# Patient Record
Sex: Male | Born: 1955 | Race: White | Hispanic: No | Marital: Married | Smoking: Never smoker
Health system: Southern US, Community
[De-identification: ages and names within clinical notes are randomized; demographics above are authoritative.]

## PROBLEM LIST (undated history)

## (undated) DIAGNOSIS — K219 Gastro-esophageal reflux disease without esophagitis: Secondary | ICD-10-CM

## (undated) DIAGNOSIS — H9190 Unspecified hearing loss, unspecified ear: Secondary | ICD-10-CM

## (undated) DIAGNOSIS — Z8042 Family history of malignant neoplasm of prostate: Secondary | ICD-10-CM

## (undated) DIAGNOSIS — K635 Polyp of colon: Secondary | ICD-10-CM

## (undated) DIAGNOSIS — Z800 Family history of malignant neoplasm of digestive organs: Secondary | ICD-10-CM

## (undated) DIAGNOSIS — G4730 Sleep apnea, unspecified: Secondary | ICD-10-CM

## (undated) DIAGNOSIS — I10 Essential (primary) hypertension: Secondary | ICD-10-CM

## (undated) DIAGNOSIS — Z8481 Family history of carrier of genetic disease: Secondary | ICD-10-CM

## (undated) HISTORY — DX: Family history of malignant neoplasm of prostate: Z80.42

## (undated) HISTORY — DX: Polyp of colon: K63.5

## (undated) HISTORY — DX: Family history of carrier of genetic disease: Z84.81

## (undated) HISTORY — DX: Family history of malignant neoplasm of digestive organs: Z80.0

## (undated) HISTORY — DX: Sleep apnea, unspecified: G47.30

---

## 2012-11-24 ENCOUNTER — Encounter (HOSPITAL_COMMUNITY): Payer: Self-pay

## 2012-11-24 ENCOUNTER — Emergency Department (HOSPITAL_COMMUNITY)
Admission: EM | Admit: 2012-11-24 | Discharge: 2012-11-24 | Disposition: A | Discharge status: Home / Self Care | Payer: TRICARE For Life (TFL) | Attending: Emergency Medicine | Admitting: Emergency Medicine

## 2012-11-24 ENCOUNTER — Emergency Department (HOSPITAL_COMMUNITY): Payer: TRICARE For Life (TFL)

## 2012-11-24 DIAGNOSIS — R05 Cough: Secondary | ICD-10-CM | POA: Insufficient documentation

## 2012-11-24 DIAGNOSIS — J3489 Other specified disorders of nose and nasal sinuses: Secondary | ICD-10-CM | POA: Insufficient documentation

## 2012-11-24 DIAGNOSIS — J40 Bronchitis, not specified as acute or chronic: Secondary | ICD-10-CM | POA: Insufficient documentation

## 2012-11-24 DIAGNOSIS — Z8719 Personal history of other diseases of the digestive system: Secondary | ICD-10-CM | POA: Insufficient documentation

## 2012-11-24 DIAGNOSIS — R059 Cough, unspecified: Secondary | ICD-10-CM | POA: Insufficient documentation

## 2012-11-24 DIAGNOSIS — R6889 Other general symptoms and signs: Secondary | ICD-10-CM | POA: Insufficient documentation

## 2012-11-24 HISTORY — DX: Gastro-esophageal reflux disease without esophagitis: K21.9

## 2012-11-24 MED ORDER — BENZONATATE 100 MG PO CAPS: 100.0000 mg | ORAL_CAPSULE | Freq: Three times a day (TID) | ORAL | Status: DC | PRN

## 2012-11-24 MED ORDER — ALBUTEROL SULFATE HFA 108 (90 BASE) MCG/ACT IN AERS: INHALATION_SPRAY | RESPIRATORY_TRACT | Status: DC

## 2012-11-24 NOTE — ED Provider Notes (Signed)
History     CSN: [id number]  Arrival date & time 11/24/12  1330   First MD Initiated Contact with Patient 11/24/12 1345      Chief Complaint  Patient presents with  . Cough  . Nasal Congestion     HPI Pt was seen at 1350.   Per pt, c/o gradual onset and persistence of constant runny/stuffy nose, cough, sinus and ears congestion for the past 2-3 weeks.  Has been associated with home fevers to "101.2" last week; since resolved.  Denies any further fevers, no rash, no CP/SOB, no N/V/D, no abd pain, no back pain, no sore throat.     Past Medical History  Diagnosis Date  . Acid reflux     History reviewed. No pertinent past surgical history.   History  Substance Use Topics  . Smoking status: Never Smoker   . Smokeless tobacco: Not on file  . Alcohol Use: No    Review of Systems ROS: Statement: All systems negative except as marked or noted in the HPI; Constitutional: +fever and chills. ; ; Eyes: Negative for eye pain, redness and discharge. ; ; ENMT: Negative for ear pain, hoarseness, sore throat. +nasal and ears congestion, rhinorrhea, sinus pressure. ; ; Cardiovascular: Negative for chest pain, palpitations, diaphoresis, dyspnea and peripheral edema. ; ; Respiratory: +cough. Negative for wheezing and stridor. ; ; Gastrointestinal: Negative for nausea, vomiting, diarrhea, abdominal pain, blood in stool, hematemesis, jaundice and rectal bleeding. . ; ; Genitourinary: Negative for dysuria, flank pain and hematuria. ; ; Musculoskeletal: Negative for back pain and neck pain. Negative for swelling and trauma.; ; Skin: Negative for pruritus, rash, abrasions, blisters, bruising and skin lesion.; ; Neuro: Negative for headache, lightheadedness and neck stiffness. Negative for weakness, altered level of consciousness , altered mental status, extremity weakness, paresthesias, involuntary movement, seizure and syncope.       Allergies  Review of patient's allergies indicates no known  allergies.  Home Medications   Current Outpatient Rx  Name  Route  Sig  Dispense  Refill  . guaifenesin (ROBITUSSIN) 100 MG/5ML syrup   Oral   Take 200 mg by mouth 3 (three) times daily as needed for cough.         . pseudoephedrine (SUDAFED) 60 MG tablet   Oral   Take 60 mg by mouth every 4 (four) hours as needed for congestion.           BP 154/85  Pulse 84  Temp(Src) 98.1 F (36.7 C) (Oral)  Resp 18  Ht 5\' 7"  (1.702 m)  Wt 203 lb (92.08 kg)  BMI 31.79 kg/m2  SpO2 98%  Physical Exam 1355: Physical examination:  Nursing notes reviewed; Vital signs and O2 SAT reviewed;  Constitutional: Well developed, Well nourished, Well hydrated, In no acute distress; Head:  Normocephalic, atraumatic; Eyes: EOMI, PERRL, No scleral icterus; ENMT: TM's clear bilat. +edemetous nasal turbinates bilat with clear rhinorrhea. Mouth and pharynx normal, Mucous membranes moist; Neck: Supple, Full range of motion, No lymphadenopathy; Cardiovascular: Regular rate and rhythm, No gallop; Respiratory: Breath sounds coarse & equal bilaterally, No wheezes.  Speaking full sentences with ease, Normal respiratory effort/excursion; Chest: Nontender, Movement normal; Abdomen: Soft, Nontender, Nondistended, Normal bowel sounds;; Extremities: Pulses normal, No tenderness, No edema, No calf edema or asymmetry.; Neuro: AA&Ox3, Major CN grossly intact.  Speech clear. Climbs on and off stretcher easily. Gait steady. No gross focal motor or sensory deficits in extremities.; Skin: Color normal, Warm, Dry.   ED Course  Procedures     MDM  MDM Reviewed: nursing note and vitals Interpretation: x-ray    Dg Chest 2 View 11/24/2012  *RADIOLOGY REPORT*  Clinical Data: Nasal congestion.  Cough.  CHEST - 2 VIEW  Comparison: None.  Findings: Lungs are under aerated and grossly clear.  Heart is normal in size.  No pleural effusion.  No pneumothorax.  Bronchitic changes of chronic appearance.  IMPRESSION: No active  cardiopulmonary disease.   Original Report Authenticated By: Jolaine Click, M.D.      1450:  No pneumonia on CXR.  VSS, afebrile, resps easy. Will tx symptomatically at this time. Dx and testing d/w pt and family.  Questions answered.  Verb understanding, agreeable to d/c home with outpt f/u.      Laray Anger, DO 11/26/12 1407

## 2012-11-24 NOTE — ED Notes (Signed)
Pt reports cough and congestion for last 3 weeks, thought he had the flu. Cont. To not get better has "cough" can't get rid of, ?fever at times. Brown/yellow mucus at times.   Has not been to the doctor.

## 2012-11-24 NOTE — ED Notes (Signed)
Pt c/o chest congestion and cough x2-3 weeks. Pt has tried several otc meds but continues to have symptoms.

## 2013-10-07 DIAGNOSIS — I639 Cerebral infarction, unspecified: Secondary | ICD-10-CM

## 2013-10-07 HISTORY — DX: Cerebral infarction, unspecified: I63.9

## 2014-01-20 ENCOUNTER — Encounter (HOSPITAL_COMMUNITY): Payer: Self-pay | Admitting: Emergency Medicine

## 2014-01-20 ENCOUNTER — Emergency Department (HOSPITAL_COMMUNITY)
Admission: EM | Admit: 2014-01-20 | Discharge: 2014-01-21 | Disposition: A | Discharge status: Home / Self Care | Payer: PRIVATE HEALTH INSURANCE | Attending: Emergency Medicine | Admitting: Emergency Medicine

## 2014-01-20 DIAGNOSIS — K219 Gastro-esophageal reflux disease without esophagitis: Secondary | ICD-10-CM | POA: Insufficient documentation

## 2014-01-20 DIAGNOSIS — M545 Low back pain, unspecified: Secondary | ICD-10-CM

## 2014-01-20 DIAGNOSIS — Y92410 Unspecified street and highway as the place of occurrence of the external cause: Secondary | ICD-10-CM | POA: Insufficient documentation

## 2014-01-20 DIAGNOSIS — Y9389 Activity, other specified: Secondary | ICD-10-CM | POA: Insufficient documentation

## 2014-01-20 DIAGNOSIS — Z79899 Other long term (current) drug therapy: Secondary | ICD-10-CM | POA: Insufficient documentation

## 2014-01-20 DIAGNOSIS — IMO0002 Reserved for concepts with insufficient information to code with codable children: Secondary | ICD-10-CM | POA: Insufficient documentation
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
## 2014-01-21 ENCOUNTER — Emergency Department (HOSPITAL_COMMUNITY): Payer: PRIVATE HEALTH INSURANCE

## 2014-01-21 MED ORDER — IBUPROFEN 800 MG PO TABS: 800.0000 mg | ORAL_TABLET | Freq: Three times a day (TID) | ORAL | Status: DC

## 2014-01-21 MED ORDER — CYCLOBENZAPRINE HCL 10 MG PO TABS: 10.0000 mg | ORAL_TABLET | Freq: Two times a day (BID) | ORAL | Status: DC | PRN

## 2014-01-21 NOTE — Discharge Instructions (Signed)
Back Pain, Adult Low back pain is very common. About 1 in 5 people have back pain.The cause of low back pain is rarely dangerous. The pain often gets better over time.About half of people with a sudden onset of back pain feel better in just 2 weeks. About 8 in 10 people feel better by 6 weeks.  CAUSES Some common causes of back pain include:  Strain of the muscles or ligaments supporting the spine.  Wear and tear (degeneration) of the spinal discs.  Arthritis.  Direct injury to the back. DIAGNOSIS Most of the time, the direct cause of low back pain is not known.However, back pain can be treated effectively even when the exact cause of the pain is unknown.Answering your caregiver's questions about your overall health and symptoms is one of the most accurate ways to make sure the cause of your pain is not dangerous. If your caregiver needs more information, he or she may order lab work or imaging tests (X-rays or MRIs).However, even if imaging tests show changes in your back, this usually does not require surgery. HOME CARE INSTRUCTIONS For many people, back pain returns.Since low back pain is rarely dangerous, it is often a condition that people can learn to manageon their own.   Remain active. It is stressful on the back to sit or stand in one place. Do not sit, drive, or stand in one place for more than 30 minutes at a time. Take short walks on level surfaces as soon as pain allows.Try to increase the length of time you walk each day.  Do not stay in bed.Resting more than 1 or 2 days can delay your recovery.  Do not avoid exercise or work.Your body is made to move.It is not dangerous to be active, even though your back may hurt.Your back will likely heal faster if you return to being active before your pain is gone.  Pay attention to your body when you bend and lift. Many people have less discomfortwhen lifting if they bend their knees, keep the load close to their bodies,and  avoid twisting. Often, the most comfortable positions are those that put less stress on your recovering back.  Find a comfortable position to sleep. Use a firm mattress and lie on your side with your knees slightly bent. If you lie on your back, put a pillow under your knees.  Only take over-the-counter or prescription medicines as directed by your caregiver. Over-the-counter medicines to reduce pain and inflammation are often the most helpful.Your caregiver may prescribe muscle relaxant drugs.These medicines help dull your pain so you can more quickly return to your normal activities and healthy exercise.  Put ice on the injured area.  Put ice in a plastic bag.  Place a towel between your skin and the bag.  Leave the ice on for 15-20 minutes, 03-04 times a day for the first 2 to 3 days. After that, ice and heat may be alternated to reduce pain and spasms.  Ask your caregiver about trying back exercises and gentle massage. This may be of some benefit.  Avoid feeling anxious or stressed.Stress increases muscle tension and can worsen back pain.It is important to recognize when you are anxious or stressed and learn ways to manage it.Exercise is a great option. SEEK MEDICAL CARE IF:  You have pain that is not relieved with rest or medicine.  You have pain that does not improve in 1 week.  You have new symptoms.  You are generally not feeling well. SEEK   IMMEDIATE MEDICAL CARE IF:   You have pain that radiates from your back into your legs.  You develop new bowel or bladder control problems.  You have unusual weakness or numbness in your arms or legs.  You develop nausea or vomiting.  You develop abdominal pain.  You feel faint. Document Released: 09/23/2005 Document Revised: 03/24/2012 Document Reviewed: 02/11/2011 [employer] Patient Information 2014 [employer].  

## 2014-10-07 DIAGNOSIS — G459 Transient cerebral ischemic attack, unspecified: Secondary | ICD-10-CM

## 2014-10-07 HISTORY — DX: Transient cerebral ischemic attack, unspecified: G45.9

## 2014-11-13 ENCOUNTER — Emergency Department (HOSPITAL_COMMUNITY)
Admission: EM | Admit: 2014-11-13 | Discharge: 2014-11-13 | Disposition: A | Discharge status: Home / Self Care | Payer: PRIVATE HEALTH INSURANCE | Attending: Emergency Medicine | Admitting: Emergency Medicine

## 2014-11-13 ENCOUNTER — Emergency Department (HOSPITAL_COMMUNITY): Payer: PRIVATE HEALTH INSURANCE

## 2014-11-13 ENCOUNTER — Encounter (HOSPITAL_COMMUNITY): Payer: Self-pay | Admitting: *Deleted

## 2014-11-13 DIAGNOSIS — Z951 Presence of aortocoronary bypass graft: Secondary | ICD-10-CM | POA: Diagnosis not present

## 2014-11-13 DIAGNOSIS — R079 Chest pain, unspecified: Secondary | ICD-10-CM | POA: Diagnosis present

## 2014-11-13 DIAGNOSIS — R0789 Other chest pain: Secondary | ICD-10-CM

## 2014-11-13 DIAGNOSIS — I2510 Atherosclerotic heart disease of native coronary artery without angina pectoris: Secondary | ICD-10-CM | POA: Insufficient documentation

## 2014-11-13 DIAGNOSIS — Z79899 Other long term (current) drug therapy: Secondary | ICD-10-CM | POA: Insufficient documentation

## 2014-11-13 HISTORY — DX: Essential (primary) hypertension: I10

## 2014-11-13 LAB — BASIC METABOLIC PANEL
Anion gap: 4 — ABNORMAL LOW (ref 5–15)
BUN: 19 mg/dL (ref 6–23)
CHLORIDE: 108 mmol/L (ref 96–112)
CO2: 27 mmol/L (ref 19–32)
Calcium: 9 mg/dL (ref 8.4–10.5)
Creatinine, Ser: 1.08 mg/dL (ref 0.50–1.35)
GFR calc Af Amer: 86 mL/min — ABNORMAL LOW (ref >90)
GFR, EST NON AFRICAN AMERICAN: 74 mL/min — AB (ref >90)
GLUCOSE: 108 mg/dL — AB (ref 70–99)
Potassium: 3.9 mmol/L (ref 3.5–5.1)
Sodium: 139 mmol/L (ref 135–145)

## 2014-11-13 LAB — CBC
HEMATOCRIT: 40.3 % (ref 39.0–52.0)
Hemoglobin: 13.5 g/dL (ref 13.0–17.0)
MCH: 29.2 pg (ref 26.0–34.0)
MCHC: 33.5 g/dL (ref 30.0–36.0)
MCV: 87.2 fL (ref 78.0–100.0)
Platelets: 167 10*3/uL (ref 150–400)
RBC: 4.62 MIL/uL (ref 4.22–5.81)
RDW: 12.8 % (ref 11.5–15.5)
WBC: 6.2 10*3/uL (ref 4.0–10.5)

## 2014-11-13 LAB — TROPONIN I: Troponin I: <0.03 ng/mL (ref <0.031)

## 2014-11-13 NOTE — ED Provider Notes (Signed)
TIME SEEN: 1:30 AM  CHIEF COMPLAINT: Chest pain  HPI: Pt is a 59 y.o. male with history of hypertension and a family history with a father with CAD and a CABG in his 72s who presents to the emergency department with left-sided dull chest pain that started around 10:30 PM. He states the pain came on whenever he bent over and lasted for several seconds to several minutes before completely resolving. He then had another episode when he bent over again. Denies any chest pain currently. No associated shortness of breath, nausea or vomiting, dizziness, diaphoresis. No history of coronary artery disease. Has had a stress test approximately 10 years ago which was normal. Denies a history of PE or DVT, recent prolonged immobilization such as long flight or hospitalization, surgery, trauma, fracture. No history of tobacco use. Denies fever or cough.  ROS: See HPI Constitutional: no fever  Eyes: no drainage  ENT: no runny nose   Cardiovascular:  chest pain  Resp: no SOB  GI: no vomiting GU: no dysuria Integumentary: no rash  Allergy: no hives  Musculoskeletal: no leg swelling  Neurological: no slurred speech ROS otherwise negative  PAST MEDICAL HISTORY/PAST SURGICAL HISTORY:  Past Medical History  Diagnosis Date  . Acid reflux   . Hypertension     MEDICATIONS:  Prior to Admission medications   Medication Sig Start Date End Date Taking? Authorizing Provider  atorvastatin (LIPITOR) 20 MG tablet Take 20 mg by mouth daily.   Yes Historical Provider, MD  famotidine (PEPCID) 20 MG tablet Take 20 mg by mouth 2 (two) times daily.   Yes Historical Provider, MD  lisinopril (PRINIVIL,ZESTRIL) 5 MG tablet Take 5 mg by mouth daily.   Yes Historical Provider, MD    ALLERGIES:  No Known Allergies  SOCIAL HISTORY:  History  Substance Use Topics  . Smoking status: Never Smoker   . Smokeless tobacco: Not on file  . Alcohol Use: No    FAMILY HISTORY: History reviewed. No pertinent family  history.  EXAM: BP 153/92 mmHg  Pulse 66  Temp(Src) 97.6 F (36.4 C)  Resp 20  Ht 5\' 8"  (1.727 m)  Wt 205 lb (92.987 kg)  BMI 31.18 kg/m2  SpO2 98% CONSTITUTIONAL: Alert and oriented and responds appropriately to questions. Well-appearing; well-nourished HEAD: Normocephalic EYES: Conjunctivae clear, PERRL ENT: normal nose; no rhinorrhea; moist mucous membranes; pharynx without lesions noted NECK: Supple, no meningismus, no LAD  CARD: RRR; S1 and S2 appreciated; no murmurs, no clicks, no rubs, no gallops RESP: Normal chest excursion without splinting or tachypnea; breath sounds clear and equal bilaterally; no wheezes, no rhonchi, no rales, tender to palpation under the left pectoralis area without crepitus or ecchymosis or deformity ABD/GI: Normal bowel sounds; non-distended; soft, non-tender, no rebound, no guarding BACK:  The back appears normal and is non-tender to palpation, there is no CVA tenderness EXT: Normal ROM in all joints; non-tender to palpation; no edema; normal capillary refill; no cyanosis; no calf tenderness or swelling   SKIN: Normal color for age and race; warm NEURO: Moves all extremities equally PSYCH: The patient's mood and manner are appropriate. Grooming and personal hygiene are appropriate.  MEDICAL DECISION MAKING: Patient here with what appears to be musculoskeletal chest pain. He does have some risk factors for ACS. EKG shows no ischemic changes. We'll obtain cardiac labs, chest x-ray. He is currently pain-free.  ED PROGRESS: Patient's first troponin is negative. Other labs unremarkable. Chest x-ray shows mild bronchitic changes without infiltrates or effusions. Will  repeat second troponin. If negative will discharge home with close outpatient follow-up as I feel this chest pain is atypical. He agrees with this plan.   Second troponin negative. Will discharge home. Discussed return precautions. Discussed PCP follow-up. He verbalizes understanding and is  comfortable with plan.     EKG Interpretation  Date/Time:  Sunday November 13 2014 00:40:35 EST Ventricular Rate:  64 PR Interval:  163 QRS Duration: 100 QT Interval:  421 QTC Calculation: 434 R Axis:   70 Text Interpretation:  Sinus rhythm Atrial premature complex No old tracing to compare Confirmed by Boby Eyer,  DO, Durwin Davisson [phone]) on 11/13/2014 1:32:46 AM         [place], DO 11/13/14 9449

## 2014-11-13 NOTE — Discharge Instructions (Signed)

## 2014-11-13 NOTE — ED Notes (Signed)
Pt states he bent over and felt a pain under his left ribcage

## 2015-06-26 DIAGNOSIS — E1169 Type 2 diabetes mellitus with other specified complication: Secondary | ICD-10-CM | POA: Insufficient documentation

## 2015-06-26 DIAGNOSIS — Z8673 Personal history of transient ischemic attack (TIA), and cerebral infarction without residual deficits: Secondary | ICD-10-CM | POA: Insufficient documentation

## 2015-06-26 DIAGNOSIS — E785 Hyperlipidemia, unspecified: Secondary | ICD-10-CM | POA: Insufficient documentation

## 2015-06-26 DIAGNOSIS — G4733 Obstructive sleep apnea (adult) (pediatric): Secondary | ICD-10-CM | POA: Insufficient documentation

## 2016-10-07 HISTORY — PX: COLONOSCOPY: SHX174

## 2017-02-24 LAB — HM COLONOSCOPY

## 2017-12-18 ENCOUNTER — Encounter (HOSPITAL_COMMUNITY): Payer: Self-pay | Admitting: Genetic Counselor

## 2017-12-18 ENCOUNTER — Inpatient Hospital Stay (HOSPITAL_COMMUNITY): Payer: PRIVATE HEALTH INSURANCE

## 2017-12-18 ENCOUNTER — Inpatient Hospital Stay (HOSPITAL_COMMUNITY): Payer: PRIVATE HEALTH INSURANCE | Attending: Genetic Counselor | Admitting: Genetic Counselor

## 2017-12-18 DIAGNOSIS — Z8042 Family history of malignant neoplasm of prostate: Secondary | ICD-10-CM

## 2017-12-18 DIAGNOSIS — Z800 Family history of malignant neoplasm of digestive organs: Secondary | ICD-10-CM

## 2017-12-18 DIAGNOSIS — Z8481 Family history of carrier of genetic disease: Secondary | ICD-10-CM | POA: Diagnosis not present

## 2017-12-18 NOTE — Progress Notes (Signed)
REFERRING PROVIDER: Leonor Liv [address] [place] [address]  PRIMARY PROVIDER:  Leonor Liv  PRIMARY REASON FOR VISIT:  1. Family history of genetic mutation for hereditary nonpolyposis colorectal cancer (HNPCC)   2. Family history of prostate cancer   3. Family history of stomach cancer      HISTORY OF PRESENT ILLNESS:   Mr. Alarid, a 62 y.o. male, was seen for a [facility] cancer genetics consultation at the request of Dr. Alyson Ingles due to a family history of cancer and a PMS2 mutation causing Lynch syndrome.  Mr. Mcnay presents to clinic today to discuss the possibility of a hereditary predisposition to cancer, genetic testing, and to further clarify his future cancer risks, as well as potential cancer risks for family members.   IMr. Corvin is a 62 y.o. male with no personal history of cancer.  His father has had two cancers, prostate cancer and gastric cancer.  Genetic testing found that he carry's a PMS2 mutation confirming a diagnosis of Lynch syndrome in the family.  CANCER HISTORY:   No history exists.    RISK FACTORS:  Dermatology: Negative for skin cancers Prostate cancer: Screening has always been negative Colonoscopy: yes; 2-3 precancerous polyps. Endoscopy: Patient reports having Barrett's esophagus Any excessive radiation exposure in the past:  no  Past Medical History:  Diagnosis Date  . Acid reflux   . Family history of genetic mutation for hereditary nonpolyposis colorectal cancer (HNPCC)   . Family history of prostate cancer   . Family history of stomach cancer   . Hypertension     No past surgical history on file.  Social History   Socioeconomic History  . Marital status: Married    Spouse name: Not on file  . Number of children: Not on file  . Years of education: Not on file  . Highest education level: Not on file  Social Needs  . Financial resource strain: Not on file  . Food insecurity - worry: Not on file  . Food  insecurity - inability: Not on file  . Transportation needs - medical: Not on file  . Transportation needs - non-medical: Not on file  Occupational History  . Not on file  Tobacco Use  . Smoking status: Never Smoker  Substance and Sexual Activity  . Alcohol use: No  . Drug use: No  . Sexual activity: Yes    Birth control/protection: None  Other Topics Concern  . Not on file  Social History Narrative  . Not on file     FAMILY HISTORY:  We obtained a detailed, 4-generation family history.  Significant diagnoses are listed below: Family History  Problem Relation Age of Onset  . Melanoma Mother 40  . Prostate cancer Father 75  . Stomach cancer Father 32       PMS2+; Lynch syndrome  . Stroke Maternal Grandmother   . Lung cancer Paternal Grandmother   . Throat cancer Paternal Grandmother   . Prostate cancer Paternal Grandfather 15  . Bladder Cancer Other        MGM's mother with urethra cancer    The patient does not have any children.  He is an only child.  Both parents are living.  The patient's mother was diagnosed with melanoma at age 62.  His mother is an only child.  The maternal grandparents are both deceased from non cancer related issues.  The patient' reports that his mother's paternal cousins may have had cancer.  The patient's father  was diagnosed with prostate cancer at 15 and gastric cancer at 66.  Genetic testing found a PMS2 mutation diagnosing Lynch syndrome. He has two brothers who are healthy and cancer free, as are all of their children.  The paternal grandparents are deceased, the grandfather had prostate cancer and the grandmother died of lung and throat cancer.  Mr. Temme is unaware of previous family history of genetic testing for hereditary cancer risks. Patient's maternal ancestors are of [place] descent, and paternal ancestors are of [place] and [place] descent. There is no reported Ashkenazi Jewish ancestry. There is no known consanguinity.  GENETIC  COUNSELING ASSESSMENT: Elsie Sakuma is a 62 y.o. 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 We, therefore, discussed and recommended the following at today's visit.   DISCUSSION: Lynch syndrome is characterized by an increased risk of colorectal cancer as well as cancers of the endometrium, ovary, prostate, stomach, small intestine, hepatobiliary tract, urinary tract, pancreas and brain. This condition is caused by pathogenic variants in one of the mismatch repair genes-MLH1, MSH2, MSH6, PMS2-as well as deletions in the 3' end of the EPCAM gene.  The following cancer risks are associated with Lynch syndrome: colon cancer risk of up to 82% (mean age of diagnosis 44-61 years), endometrial cancer risk of up to 14-71% (mean age of diagnosis 48-62 years), gastric cancer risk of up to 13% (mean age of diagnosis 57 years), ovarian cancer risk of up to 24% (mean age of diagnosis 42.5 years). Risks for cancers of the prostate, small intestine, hepatobiliary tract, urinary tract, pancreas, and brain are also increased (PMID: 25366440, [id number], [id number]). See the table below for gene-specific cancer risks.    Inheritance Lynch syndrome has autosomal dominant inheritance. This means that an individual with a pathogenic variant has a 50% chance of passing the condition on to their offspring. Mr. Groft father has a PMS2 mutation, and therefore he has a 50% chance of also having this mutation, confirming a diagnosis of Lynch syndrome.  Once a pathogenic mutation is detected in an individual, it is possible to identify at-risk relatives who can pursue testing for this specific familial variant. Many cases are inherited from a parent, but some can occur spontaneously (i.e., an individual with a pathogenic variant has parents who do not have it); however, that individual now has a 50% risk of passing it on to future  offspring.  Additionally, individuals with a pathogenic variant in one of the MMR genes (MLH1, MSH2, MSH6, PMS2) are carriers of constitutional mismatch repair deficiency (CMMR-D). CMMR-D is a childhood-onset cancer predisposition syndrome that can present with hematological malignancies, cancers of the brain and central nervous system, Lynch syndrome-associated cancers (colon, uterine, small bowel, urinary tract), embryonic tumors, and sarcomas.   We discussed genetic testing, including the appropriate family members to test, the process of testing, insurance coverage and turn-around-time for results. We discussed the implications of a negative, positive and/or variant of uncertain significant result. We recommended Mr. Topper pursue genetic testing for the PMS2 gene.   Based on Mr. Criswell family history of cancer, he meets medical criteria for genetic testing. Testing is currently being performed within 90 days of his father's genetic test results.  Therefore, there should not be a cost for genetic testing.   PLAN: After considering the risks, benefits, and limitations, Mr. Lafauci  provided informed consent to pursue genetic testing and the blood sample was sent  to [facility] for analysis of the PMS2 gene. Results should be available within approximately 1-2 weeks' time, at which point they will be disclosed by telephone to Mr. Cromie, as will any additional recommendations warranted by these results. Mr. Kemler will receive a summary of his genetic counseling visit and a copy of his results once available. This information will also be available in Epic. We encouraged Mr. Sebesta to remain in contact with cancer genetics annually so that we can continuously update the family history and inform him of any changes in cancer genetics and testing that may be of benefit for his family. Mr. Faircloth questions were answered to his satisfaction today. Our contact information was provided should additional  questions or concerns arise.  Based on Mr. Murri family history, we recommended his paternal uncles and cousins have genetic counseling and testing. Mr. Mecca will let us know if we can be of any assistance in coordinating genetic counseling and/or testing for this family member.   Lastly, we encouraged Mr. Kann to remain in contact with cancer genetics annually so that we can continuously update the family history and inform him of any changes in cancer genetics and testing that may be of benefit for this family.   Mr.  Roza questions were answered to his satisfaction today. Our contact information was provided should additional questions or concerns arise. Thank you for the referral and allowing [place] to share in the care of your patient.   Johnston Maddocks P. Florene Glen, [place], [facility] Certified Genetic Counselor [email or web address] phone: [phone]  The patient was seen for a total of 35 minutes in face-to-face genetic counseling.  This patient was discussed with Drs. Magrinat, Lindi Adie and/or Burr Medico who agrees with the above.    _______________________________________________________________________ For Office Staff:  Number of people involved in session: 2 Was an Intern/ student involved with case: no

## 2018-01-02 ENCOUNTER — Encounter: Payer: Self-pay | Admitting: Genetic Counselor

## 2018-01-02 ENCOUNTER — Telehealth: Payer: Self-pay | Admitting: Genetic Counselor

## 2018-01-02 DIAGNOSIS — Z1379 Encounter for other screening for genetic and chromosomal anomalies: Secondary | ICD-10-CM | POA: Insufficient documentation

## 2018-01-02 NOTE — Telephone Encounter (Signed)
LM on VM with good news.  Asked that he CB. 

## 2018-01-06 ENCOUNTER — Ambulatory Visit: Payer: Self-pay | Admitting: Genetic Counselor

## 2018-01-06 DIAGNOSIS — Z800 Family history of malignant neoplasm of digestive organs: Secondary | ICD-10-CM

## 2018-01-06 DIAGNOSIS — Z8042 Family history of malignant neoplasm of prostate: Secondary | ICD-10-CM

## 2018-01-06 DIAGNOSIS — Z1379 Encounter for other screening for genetic and chromosomal anomalies: Secondary | ICD-10-CM

## 2018-01-06 DIAGNOSIS — Z8481 Family history of carrier of genetic disease: Secondary | ICD-10-CM

## 2018-01-06 NOTE — Telephone Encounter (Signed)
Revealed negative genetic testing to the patient.  He does not carry the PMS2 mutation identified in his father.  Therefore he does not have Lynch syndrome.

## 2018-01-06 NOTE — Progress Notes (Signed)
HPI:  Mr. Lonigro was previously seen in the [facility] clinic due to a family history of cancer and diagnosis of Lynch syndrome in his father, as well as concerns regarding a hereditary predisposition to cancer. Please refer to our prior cancer genetics clinic note for more information regarding Mr. Brentlinger medical, social and family histories, and our assessment and recommendations, at the time. Mr. Haigler recent genetic test results were disclosed to him, as were recommendations warranted by these results. These results and recommendations are discussed in more detail below.  CANCER HISTORY:   No history exists.    FAMILY HISTORY:  We obtained a detailed, 4-generation family history.  Significant diagnoses are listed below: Family History  Problem Relation Age of Onset  . Melanoma Mother 35  . Prostate cancer Father 27  . Stomach cancer Father 23       PMS2+; Lynch syndrome  . Stroke Maternal Grandmother   . Lung cancer Paternal Grandmother   . Throat cancer Paternal Grandmother   . Prostate cancer Paternal Grandfather 65  . Bladder Cancer Other        MGM's mother with urethra cancer    The patient does not have any children.  He is an only child.  Both parents are living.  The patient's mother was diagnosed with melanoma at age 60.  His mother is an only child.  The maternal grandparents are both deceased from non cancer related issues.  The patient' reports that his mother's paternal cousins may have had cancer.  The patient's father was diagnosed with prostate cancer at 45 and gastric cancer at 34.  Genetic testing found a PMS2 mutation diagnosing Lynch syndrome. He has two brothers who are healthy and cancer free, as are all of their children.  The paternal grandparents are deceased, the grandfather had prostate cancer and the grandmother died of lung and throat cancer.  Mr. Strick is unaware of previous family history of genetic testing for hereditary cancer  risks. Patient's maternal ancestors are of [place] descent, and paternal ancestors are of [place] and [place] descent. There is no reported Ashkenazi Jewish ancestry. There is no known consanguinity.  GENETIC TEST RESULTS:  We recommended Mr. Kirtz pursue testing for the familial hereditary cancer gene mutation called PMS2, [id number] ([id number]*). Mr. Scheurich test was normal and did not reveal the familial mutation. We call this result a true negative result because the cancer-causing mutation was identified in Mr. Penton family, and he did not inherit it.  Given this negative result, Mr. Withem chances of developing Lynch syndrome-related cancers are the same as they are in the general population.    ADDITIONAL GENETIC TESTING: We discussed with Mr. Seavey that there are other genes that are associated with increased cancer risk that can be analyzed. The laboratories that offer such testing look at these additional genes via a hereditary cancer gene panel. Should Mr. Crumby wish to pursue additional genetic testing, we are happy to discuss and coordinate this testing, at any time.    CANCER SCREENING RECOMMENDATIONS: This normal result is reassuring and indicates that Mr. Swaim does not likely have an increased risk of cancer due to a mutation in one of these genes.  We, therefore, recommended  Mr. Shin continue to follow the cancer screening guidelines provided by his primary healthcare providers.   An individual's cancer risk and medical management are not determined by genetic test results alone. Overall cancer risk assessment incorporates additional factors, including personal medical history, family history,  and any available genetic information that may result in a personalized plan for cancer prevention and surveillance.  RECOMMENDATIONS FOR FAMILY MEMBERS:  Women in this family might be at some increased risk of developing cancer, over the general population risk, simply due to the family history  of cancer.  We recommended women in this family have a yearly mammogram beginning at age 73, or 58 years younger than the earliest onset of cancer, an annual clinical breast exam, and perform monthly breast self-exams. Women in this family should also have a gynecological exam as recommended by their primary provider. All family members should have a colonoscopy by age 11.  FOLLOW-UP: Lastly, we discussed with Mr. Thong that cancer genetics is a rapidly advancing field and it is possible that new genetic tests will be appropriate for him and/or his family members in the future. We encouraged him to remain in contact with cancer genetics on an annual basis so we can update his personal and family histories and let him know of advances in cancer genetics that may benefit this family.   Our contact number was provided. Mr. Kabir questions were answered to his satisfaction, and he knows he is welcome to call us at anytime with additional questions or concerns.   Roma Kayser, MS, [facility] Certified Genetic Counselor [email or web address]

## 2018-01-06 NOTE — Telephone Encounter (Signed)
-----  Message from Clarene Essex, Counselor sent at 12/18/2017 12:03 PM EDT ----- Get GI MD information as well., PMS2

## 2018-11-07 ENCOUNTER — Other Ambulatory Visit: Payer: Self-pay

## 2018-11-07 ENCOUNTER — Emergency Department (HOSPITAL_COMMUNITY)
Admission: EM | Admit: 2018-11-07 | Discharge: 2018-11-07 | Disposition: A | Discharge status: Home / Self Care | Payer: PRIVATE HEALTH INSURANCE | Attending: Emergency Medicine | Admitting: Emergency Medicine

## 2018-11-07 ENCOUNTER — Encounter (HOSPITAL_COMMUNITY): Payer: Self-pay | Admitting: Emergency Medicine

## 2018-11-07 DIAGNOSIS — S61011A Laceration without foreign body of right thumb without damage to nail, initial encounter: Secondary | ICD-10-CM | POA: Insufficient documentation

## 2018-11-07 DIAGNOSIS — I10 Essential (primary) hypertension: Secondary | ICD-10-CM | POA: Insufficient documentation

## 2018-11-07 DIAGNOSIS — W25XXXA Contact with sharp glass, initial encounter: Secondary | ICD-10-CM | POA: Insufficient documentation

## 2018-11-07 DIAGNOSIS — Y999 Unspecified external cause status: Secondary | ICD-10-CM | POA: Insufficient documentation

## 2018-11-07 DIAGNOSIS — Z79899 Other long term (current) drug therapy: Secondary | ICD-10-CM | POA: Diagnosis not present

## 2018-11-07 DIAGNOSIS — Y9389 Activity, other specified: Secondary | ICD-10-CM | POA: Diagnosis not present

## 2018-11-07 DIAGNOSIS — Y92512 Supermarket, store or market as the place of occurrence of the external cause: Secondary | ICD-10-CM | POA: Diagnosis not present

## 2018-11-07 HISTORY — DX: Unspecified hearing loss, unspecified ear: H91.90

## 2018-11-07 MED ORDER — POVIDONE-IODINE 10 % EX SOLN
CUTANEOUS | Status: AC
  Administered 2018-11-07: 2
  Filled 2018-11-07: qty 30

## 2018-11-07 NOTE — ED Triage Notes (Signed)
Pt states went to pick up a candle at [employer] and top was broke. Class cut right thumb and index finger. bandaids applied at [place] with bleeding controlled.

## 2018-11-07 NOTE — Discharge Instructions (Addendum)
Keep the wound clean and dry.  The Steri-Strips should begin to peel off in a week or so.  Return to the ER or follow-up with your primary care provider for any signs of infection such as swelling, drainage, or red streaking.

## 2018-11-07 NOTE — ED Provider Notes (Signed)
[facility] EMERGENCY DEPARTMENT Provider Note   CSN: [id number] Arrival date & time: 11/07/18  2037     History   Chief Complaint Chief Complaint  Patient presents with  . Laceration    HPI Bryan Shepard is a 63 y.o. male.  HPI   Bryan Shepard is a 63 y.o. male who presents to the Emergency Department complaining of laceration of the right thumb that occurred from a piece of broken glass.  He states he was shopping at [place] and looking at a candle and did not realize that the container was broken.  He is concerned that he may have glass in the laceration.  Bleeding was controlled prior to arrival.  He denies numbness or weakness of the finger, pain or difficulty with movement of the thumb and swelling.  Last TD is up-to-date per patient.  He does not take anticoagulants   Past Medical History:  Diagnosis Date  . Acid reflux   . Family history of genetic mutation for hereditary nonpolyposis colorectal cancer (HNPCC)   . Family history of prostate cancer   . Family history of stomach cancer   . HOH (hard of hearing)   . Hypertension     Patient Active Problem List   Diagnosis Date Noted  . Genetic testing 01/02/2018  . Family history of genetic mutation for hereditary nonpolyposis colorectal cancer (HNPCC)   . Family history of prostate cancer   . Family history of stomach cancer     History reviewed. No pertinent surgical history.    Home Medications    Prior to Admission medications   Medication Sig Start Date End Date Taking? Authorizing Provider  atorvastatin (LIPITOR) 20 MG tablet Take 20 mg by mouth daily.    [provider]  famotidine (PEPCID) 20 MG tablet Take 20 mg by mouth 2 (two) times daily.    [provider]  lisinopril (PRINIVIL,ZESTRIL) 5 MG tablet Take 5 mg by mouth daily.    [provider]    Family History Family History  Problem Relation Age of Onset  . Melanoma Mother 10  . Prostate cancer Father 94  . Stomach  cancer Father 42       PMS2+; Lynch syndrome  . Stroke Maternal Grandmother   . Lung cancer Paternal Grandmother   . Throat cancer Paternal Grandmother   . Prostate cancer Paternal Grandfather 16  . Bladder Cancer Other        MGM's mother with urethra cancer    Social History Social History   Tobacco Use  . Smoking status: Never Smoker  . Smokeless tobacco: Never Used  Substance Use Topics  . Alcohol use: No  . Drug use: No     Allergies   Patient has no known allergies.   Review of Systems Review of Systems  Constitutional: Negative for chills and fever.  Musculoskeletal: Negative for arthralgias, back pain and joint swelling.  Skin: Positive for wound.       Laceration right thumb  Neurological: Negative for weakness and numbness.  Hematological: Does not bruise/bleed easily.     Physical Exam Updated Vital Signs BP 138/73 (BP Location: Left Arm)   Pulse 77   Temp (!) 97 F (36.1 C) (Oral)   Resp 20   Ht _0  (1.702 m)   Wt 95.7 kg   SpO2 97%   BMI 33.05 kg/m   Physical Exam Vitals signs and nursing note reviewed.  Constitutional:      General: He is not  in acute distress.    Appearance: Normal appearance. He is well-developed.  HENT:     Head: Atraumatic.  Cardiovascular:     Rate and Rhythm: Normal rate and regular rhythm.     Pulses: Normal pulses.     Heart sounds: No murmur.  Pulmonary:     Effort: Pulmonary effort is normal. No respiratory distress.  Musculoskeletal:        General: No tenderness.     Right hand: He exhibits laceration. He exhibits no tenderness, no bony tenderness and no swelling. Normal sensation noted. Normal strength noted. He exhibits no finger abduction, no thumb/finger opposition and no wrist extension trouble.  Skin:    General: Skin is warm.     Findings: Laceration present.     Comments: Superficial 1-1/2 cm laceration to the radial aspect of the mid right thumb.  Wound explored, no foreign bodies seen.   Bleeding is controlled.  Neurological:     General: No focal deficit present.     Mental Status: He is alert.     Sensory: Sensation is intact.     Motor: Motor function is intact. No weakness or abnormal muscle tone.     Coordination: Coordination normal.      ED Treatments / Results  Labs (all labs ordered are listed, but only abnormal results are displayed) Labs Reviewed - No data to display  EKG None  Radiology No results found.  Procedures Procedures (including critical care time)   LACERATION REPAIR Performed by: Rasheda Ledger Authorized by: Cela Newcom Consent: Verbal consent obtained. Risks and benefits: risks, benefits and alternatives were discussed Consent given by: patient Patient identity confirmed: provided demographic data Prepped and Draped in normal sterile fashion Wound explored  Laceration Location: right thumb  Laceration Length: 1.5 cm  No Foreign Bodies seen or palpated  Anesthesia:none  Irrigation method: syringe Amount of cleaning: standard  Skin closure: steri-strips  Number of strips:  2  Technique: topical application  Patient tolerance: Patient tolerated the procedure well with no immediate complications.   Medications Ordered in ED Medications  povidone-iodine (BETADINE) 10 % external solution (has no administration in time range)     Initial Impression / Assessment and Plan / ED Course  I have reviewed the triage vital signs and the nursing notes.  Pertinent labs & imaging results that were available during my care of the patient were reviewed by me and considered in my medical decision making (see chart for details).     Wound was cleaned with saline and Betadine.  Patient reports TD is up-to-date.  No foreign body seen, laceration appears superficial and motor and sensation are intact.  Steri-Strips applied.  Patient agrees to Tylenol if needed for pain wound care instructions discussed.  Final Clinical  Impressions(s) / ED Diagnoses   Final diagnoses:  Laceration of right thumb without foreign body without damage to nail, initial encounter    ED Discharge Orders    None       Kem Parkinson, Hershal Coria 11/07/18 2227    Margette Fast, MD 11/08/18 [phone]

## 2019-09-08 ENCOUNTER — Other Ambulatory Visit: Payer: Self-pay | Admitting: Family Medicine

## 2019-09-08 DIAGNOSIS — R748 Abnormal levels of other serum enzymes: Secondary | ICD-10-CM

## 2019-09-13 ENCOUNTER — Ambulatory Visit (HOSPITAL_COMMUNITY)
Admission: RE | Admit: 2019-09-13 | Discharge: 2019-09-13 | Disposition: A | Discharge status: Home / Self Care | Source: Ambulatory Visit | Attending: Family Medicine | Admitting: Family Medicine

## 2019-09-13 ENCOUNTER — Other Ambulatory Visit: Payer: Self-pay

## 2019-09-13 DIAGNOSIS — R748 Abnormal levels of other serum enzymes: Secondary | ICD-10-CM | POA: Insufficient documentation

## 2019-10-21 ENCOUNTER — Other Ambulatory Visit: Payer: Self-pay

## 2019-10-22 ENCOUNTER — Ambulatory Visit (INDEPENDENT_AMBULATORY_CARE_PROVIDER_SITE_OTHER): Admitting: Family Medicine

## 2019-10-22 ENCOUNTER — Encounter: Payer: Self-pay | Admitting: Family Medicine

## 2019-10-22 VITALS — BP 131/79 | HR 81 | Temp 98.6°F | Resp 20 | Ht 67.0 in | Wt 217.0 lb

## 2019-10-22 DIAGNOSIS — Z0000 Encounter for general adult medical examination without abnormal findings: Secondary | ICD-10-CM

## 2019-10-22 DIAGNOSIS — G4730 Sleep apnea, unspecified: Secondary | ICD-10-CM

## 2019-10-22 DIAGNOSIS — Z23 Encounter for immunization: Secondary | ICD-10-CM

## 2019-10-22 DIAGNOSIS — L309 Dermatitis, unspecified: Secondary | ICD-10-CM

## 2019-10-22 MED ORDER — SHINGRIX 50 MCG/0.5ML IM SUSR: 0.5000 mL | Freq: Once | INTRAMUSCULAR | 0 refills | Status: AC

## 2019-10-22 MED ORDER — TRIAMCINOLONE ACETONIDE 0.5 % EX OINT: 1.0000 "application " | TOPICAL_OINTMENT | Freq: Two times a day (BID) | CUTANEOUS | 0 refills | Status: DC

## 2019-10-22 NOTE — Progress Notes (Signed)
New Patient Office Visit  Assessment & Plan:  1. Sleep apnea with use of continuous positive airway pressure (CPAP) - Need to order new CPAP machine. I did advise patient I would have to wait until I have his sleep study results as they would require this.   2. Dermatitis - Rx for triamcinolone cream BID.   3. Immunization due - SHINGRIX injection; Inject 0.5 mLs into the muscle once for 1 dose.  Dispense: 0.5 mL; Refill: 0  4. Healthcare maintenance - Patient reports he is UTD with tetanus vaccine and colonoscopy. We will request these records today.    Follow-up: Return in about 6 months (around 04/20/2020) for follow-up of chronic medication conditions.   Hendricks Limes, MSN, APRN, FNP-C [facility]  Subjective:  Patient ID: Prem Coykendall, male    DOB: [birth year]  Age: 64 y.o. MRN: [id number]  Patient Care Team: Loman Brooklyn, FNP as PCP - General (Family Medicine)  CC:  Chief Complaint  Patient presents with  . Establish Care    HPI Hykeem Ojeda presents to establish care. Patient is transferring care from [facility] as he has recently moved to the area to care for his elderly parents.  Patient does go to the [place] for his blood pressure medication but would like to maintain a relationship with [place] for the rest of his primary care needs so that it is consistent.   Taking Claritin to treat runny nose that started after he started on Lisinopril.   Patient gets his CPAP supplies from the CPAP store. He is in need of a new CPAP at his is falling apart it is so old. He is not sure when his last sleep study was completed but feels it was around 5 years ago at [facility] (we do not have medical records but are requesting them). He does wear CPAP almost every night. He was  Unable to wear it last night as the nasal pillows were hurting his nose. He will purchase some more.   He also has concerns regarding an itchy rash on his sides that seems  to get worse with stress. He has only ever treated them with OTC steroid creams which improves them but never gets rid of them.   Patient reports varicose veins and states he wears compression stockings daily.   Review of Systems  Constitutional: Negative for chills, fever, malaise/fatigue and weight loss.  HENT: Positive for hearing loss. Negative for congestion, ear discharge, ear pain, nosebleeds, sinus pain, sore throat and tinnitus.   Eyes: Negative for blurred vision, double vision, pain, discharge and redness.  Respiratory: Negative for cough, shortness of breath and wheezing.   Cardiovascular: Positive for palpitations (fleeting at times). Negative for chest pain and leg swelling.  Gastrointestinal: Negative for abdominal pain, constipation, diarrhea, heartburn, nausea and vomiting.  Genitourinary: Negative for dysuria, frequency and urgency.  Musculoskeletal: Negative for myalgias.  Skin: Negative for rash.  Neurological: Positive for speech change (patient reports he sometimes has trouble with his words since his stroke). Negative for dizziness, seizures, weakness and headaches.  Psychiatric/Behavioral: Positive for memory loss (feels he has slight trouble since his stroke). Negative for depression, substance abuse and suicidal ideas. The patient is not nervous/anxious.     Current Outpatient Medications:  .  aspirin EC 81 MG tablet, Take 81 mg by mouth daily., Disp: , Rfl:  .  Cholecalciferol (VITAMIN D3) 50 MCG (2000 UT) capsule, Take by mouth., Disp: , Rfl:  .  famotidine (PEPCID) 20 MG tablet, Take 20 mg by mouth 2 (two) times daily., Disp: , Rfl:  .  lisinopril (PRINIVIL,ZESTRIL) 5 MG tablet, Take 5 mg by mouth daily., Disp: , Rfl:  .  loratadine (CLARITIN) 10 MG tablet, Take 1 tablet by mouth daily., Disp: , Rfl:  .  Melatonin 5 MG CAPS, Take 0.5 tablets by mouth daily., Disp: , Rfl:  .  UNABLE TO FIND, Tumeric capsule, 1 capsule once daily, Disp: , Rfl:  .  valACYclovir  (VALTREX) 1000 MG tablet, Take 1 tablet by mouth daily as needed., Disp: , Rfl:  .  Vitamin A 2400 MCG (8000 UT) CAPS, Take by mouth. 1 a day, Disp: , Rfl:  .  triamcinolone ointment (KENALOG) 0.5 %, Apply 1 application topically 2 (two) times daily., Disp: 30 g, Rfl: 0  No Known Allergies  Past Medical History:  Diagnosis Date  . Acid reflux   . Family history of genetic mutation for hereditary nonpolyposis colorectal cancer (HNPCC)   . Family history of prostate cancer   . Family history of stomach cancer   . HOH (hard of hearing)   . Hypertension   . Sleep apnea    CPAP  . Stroke ([place])   . TIA (transient ischemic attack)     History reviewed. No pertinent surgical history.  Family History  Problem Relation Age of Onset  . Melanoma Mother 63  . Hypertension Mother   . Prostate cancer Father 22  . Stomach cancer Father 2       PMS2+; Lynch syndrome  . Melanoma Father   . Diabetes Father   . Stroke Maternal Grandmother   . Lung cancer Paternal Grandmother   . Throat cancer Paternal Grandmother   . Prostate cancer Paternal Grandfather 29  . Bladder Cancer Other        MGM's mother with urethra cancer    Social History   Socioeconomic History  . Marital status: Married    Spouse name: [name]  . Number of children: Not on file  . Years of education: Not on file  . Highest education level: Not on file  Occupational History  . Occupation: retired     Comment: [employer]   Tobacco Use  . Smoking status: Never Smoker  . Smokeless tobacco: Never Used  Substance and Sexual Activity  . Alcohol use: No  . Drug use: No  . Sexual activity: Yes    Birth control/protection: None  Other Topics Concern  . Not on file  Social History Narrative   Married , 1 step daughter    Social Determinants of Health   Financial Resource Strain:   . Difficulty of Paying Living Expenses: Not on file  Food Insecurity:   . Worried About Charity fundraiser in the Last Year: Not on file    . Ran Out of Food in the Last Year: Not on file  Transportation Needs:   . Lack of Transportation (Medical): Not on file  . Lack of Transportation (Non-Medical): Not on file  Physical Activity:   . Days of Exercise per Week: Not on file  . Minutes of Exercise per Session: Not on file  Stress:   . Feeling of Stress : Not on file  Social Connections:   . Frequency of Communication with Friends and Family: Not on file  . Frequency of Social Gatherings with Friends and Family: Not on file  . Attends Religious Services: Not on file  . Active Member of Clubs or  Organizations: Not on file  . Attends Archivist Meetings: Not on file  . Marital Status: Not on file  Intimate Partner Violence:   . Fear of Current or Ex-Partner: Not on file  . Emotionally Abused: Not on file  . Physically Abused: Not on file  . Sexually Abused: Not on file    Objective:   Today's Vitals: BP 131/79 (BP Location: Left Arm, Cuff Size: Normal)   Pulse 81   Temp 98.6 F (37 C)   Resp 20   Ht '5\' 7"'  (1.702 m)   Wt 217 lb (98.4 kg)   SpO2 98%   BMI 33.99 kg/m   Physical Exam Vitals reviewed.  Constitutional:      General: He is not in acute distress.    Appearance: Normal appearance. He is obese. He is not ill-appearing, toxic-appearing or diaphoretic.  HENT:     Head: Normocephalic and atraumatic.  Eyes:     General: No scleral icterus.       Right eye: No discharge.        Left eye: No discharge.     Conjunctiva/sclera: Conjunctivae normal.  Cardiovascular:     Rate and Rhythm: Normal rate and regular rhythm.     Heart sounds: Normal heart sounds. No murmur. No friction rub. No gallop.   Pulmonary:     Effort: Pulmonary effort is normal. No respiratory distress.     Breath sounds: Normal breath sounds. No stridor. No wheezing, rhonchi or rales.  Musculoskeletal:        General: Normal range of motion.     Cervical back: Normal range of motion.  Skin:    General: Skin is warm and  dry.     Comments: Patient has numerous erythematous spots on torso from healing areas of scratching. He has a few papules that are pruritic.   Neurological:     Mental Status: He is alert and oriented to person, place, and time. Mental status is at baseline.  Psychiatric:        Mood and Affect: Mood normal.        Behavior: Behavior normal.        Thought Content: Thought content normal.        Judgment: Judgment normal.

## 2019-10-27 ENCOUNTER — Telehealth: Payer: Self-pay | Admitting: Family Medicine

## 2019-10-27 MED ORDER — LISINOPRIL 5 MG PO TABS: 5.0000 mg | ORAL_TABLET | Freq: Every day | ORAL | 2 refills | Status: DC

## 2019-10-27 NOTE — Telephone Encounter (Signed)
Ok to refill lisinopril? We have never rx'd it?

## 2019-10-27 NOTE — Telephone Encounter (Signed)
Refilled

## 2019-11-09 NOTE — Telephone Encounter (Signed)
Please let patient know I received his letter and I am happy to provide the prescription for his Lisinopril.

## 2019-11-09 NOTE — Telephone Encounter (Signed)
Patient aware and verbalized understanding. °

## 2019-11-19 ENCOUNTER — Ambulatory Visit: Attending: Family

## 2019-11-19 ENCOUNTER — Other Ambulatory Visit: Payer: Self-pay

## 2019-11-19 DIAGNOSIS — Z20822 Contact with and (suspected) exposure to covid-19: Secondary | ICD-10-CM

## 2019-11-20 LAB — NOVEL CORONAVIRUS, NAA: SARS-CoV-2, NAA: NOT DETECTED

## 2019-12-09 ENCOUNTER — Encounter: Payer: Self-pay | Admitting: Family Medicine

## 2019-12-09 ENCOUNTER — Telehealth: Payer: Self-pay | Admitting: Family Medicine

## 2019-12-09 NOTE — Telephone Encounter (Signed)
Done - pulled over

## 2019-12-09 NOTE — Telephone Encounter (Signed)
Can we please abstract the colonoscopy from care everywhere to satisfy our health maintenance requirements?

## 2020-04-15 ENCOUNTER — Emergency Department (HOSPITAL_COMMUNITY)

## 2020-04-15 ENCOUNTER — Other Ambulatory Visit: Payer: Self-pay

## 2020-04-15 ENCOUNTER — Encounter (HOSPITAL_COMMUNITY): Payer: Self-pay | Admitting: Emergency Medicine

## 2020-04-15 ENCOUNTER — Inpatient Hospital Stay (HOSPITAL_COMMUNITY)
Admission: EM | Admit: 2020-04-15 | Discharge: 2020-04-18 | DRG: 419 | Disposition: A | Discharge status: Home / Self Care | Attending: General Surgery | Admitting: General Surgery

## 2020-04-15 DIAGNOSIS — Z833 Family history of diabetes mellitus: Secondary | ICD-10-CM | POA: Diagnosis not present

## 2020-04-15 DIAGNOSIS — I10 Essential (primary) hypertension: Secondary | ICD-10-CM | POA: Diagnosis present

## 2020-04-15 DIAGNOSIS — Z79899 Other long term (current) drug therapy: Secondary | ICD-10-CM

## 2020-04-15 DIAGNOSIS — R109 Unspecified abdominal pain: Secondary | ICD-10-CM

## 2020-04-15 DIAGNOSIS — Z20822 Contact with and (suspected) exposure to covid-19: Secondary | ICD-10-CM | POA: Diagnosis present

## 2020-04-15 DIAGNOSIS — H9190 Unspecified hearing loss, unspecified ear: Secondary | ICD-10-CM | POA: Diagnosis present

## 2020-04-15 DIAGNOSIS — Z7982 Long term (current) use of aspirin: Secondary | ICD-10-CM | POA: Diagnosis not present

## 2020-04-15 DIAGNOSIS — Z8719 Personal history of other diseases of the digestive system: Secondary | ICD-10-CM

## 2020-04-15 DIAGNOSIS — Z823 Family history of stroke: Secondary | ICD-10-CM | POA: Diagnosis not present

## 2020-04-15 DIAGNOSIS — Z8673 Personal history of transient ischemic attack (TIA), and cerebral infarction without residual deficits: Secondary | ICD-10-CM | POA: Diagnosis not present

## 2020-04-15 DIAGNOSIS — Z8249 Family history of ischemic heart disease and other diseases of the circulatory system: Secondary | ICD-10-CM | POA: Diagnosis not present

## 2020-04-15 DIAGNOSIS — K8000 Calculus of gallbladder with acute cholecystitis without obstruction: Principal | ICD-10-CM | POA: Diagnosis present

## 2020-04-15 DIAGNOSIS — K810 Acute cholecystitis: Secondary | ICD-10-CM | POA: Diagnosis present

## 2020-04-15 DIAGNOSIS — R1084 Generalized abdominal pain: Secondary | ICD-10-CM

## 2020-04-15 LAB — URINALYSIS, ROUTINE W REFLEX MICROSCOPIC
Bilirubin Urine: NEGATIVE
Glucose, UA: 150 mg/dL — AB
Hgb urine dipstick: NEGATIVE
Ketones, ur: NEGATIVE mg/dL
Leukocytes,Ua: NEGATIVE
Nitrite: NEGATIVE
Protein, ur: NEGATIVE mg/dL
Specific Gravity, Urine: 1.043 — ABNORMAL HIGH (ref 1.005–1.030)
pH: 7 (ref 5.0–8.0)

## 2020-04-15 LAB — CBC WITH DIFFERENTIAL/PLATELET
Abs Immature Granulocytes: 0.01 10*3/uL (ref 0.00–0.07)
Basophils Absolute: 0 10*3/uL (ref 0.0–0.1)
Basophils Relative: 0 %
Eosinophils Absolute: 0.1 10*3/uL (ref 0.0–0.5)
Eosinophils Relative: 1 %
HCT: 49.2 % (ref 39.0–52.0)
Hemoglobin: 16.3 g/dL (ref 13.0–17.0)
Immature Granulocytes: 0 %
Lymphocytes Relative: 23 %
Lymphs Abs: 1.6 10*3/uL (ref 0.7–4.0)
MCH: 29 pg (ref 26.0–34.0)
MCHC: 33.1 g/dL (ref 30.0–36.0)
MCV: 87.4 fL (ref 80.0–100.0)
Monocytes Absolute: 0.4 10*3/uL (ref 0.1–1.0)
Monocytes Relative: 6 %
Neutro Abs: 4.9 10*3/uL (ref 1.7–7.7)
Neutrophils Relative %: 70 %
Platelets: 198 10*3/uL (ref 150–400)
RBC: 5.63 MIL/uL (ref 4.22–5.81)
RDW: 12.4 % (ref 11.5–15.5)
WBC: 7 10*3/uL (ref 4.0–10.5)
nRBC: 0 % (ref 0.0–0.2)

## 2020-04-15 LAB — COMPREHENSIVE METABOLIC PANEL
ALT: 43 U/L (ref 0–44)
AST: 27 U/L (ref 15–41)
Albumin: 3.9 g/dL (ref 3.5–5.0)
Alkaline Phosphatase: 56 U/L (ref 38–126)
Anion gap: 9 (ref 5–15)
BUN: 13 mg/dL (ref 8–23)
CO2: 24 mmol/L (ref 22–32)
Calcium: 9 mg/dL (ref 8.9–10.3)
Chloride: 104 mmol/L (ref 98–111)
Creatinine, Ser: 1.05 mg/dL (ref 0.61–1.24)
GFR calc Af Amer: >60 mL/min (ref >60)
GFR calc non Af Amer: >60 mL/min (ref >60)
Glucose, Bld: 188 mg/dL — ABNORMAL HIGH (ref 70–99)
Potassium: 4 mmol/L (ref 3.5–5.1)
Sodium: 137 mmol/L (ref 135–145)
Total Bilirubin: 0.5 mg/dL (ref 0.3–1.2)
Total Protein: 6.6 g/dL (ref 6.5–8.1)

## 2020-04-15 LAB — LIPASE, BLOOD: Lipase: 27 U/L (ref 11–51)

## 2020-04-15 LAB — SURGICAL PCR SCREEN
MRSA, PCR: NEGATIVE
Staphylococcus aureus: NEGATIVE

## 2020-04-15 LAB — SARS CORONAVIRUS 2 BY RT PCR (HOSPITAL ORDER, PERFORMED IN ~~LOC~~ HOSPITAL LAB): SARS Coronavirus 2: NEGATIVE

## 2020-04-15 MED ORDER — MELATONIN 3 MG PO TABS: 6.0000 mg | ORAL_TABLET | Freq: Every evening | ORAL | Status: DC | PRN

## 2020-04-15 MED ORDER — ACETAMINOPHEN 500 MG PO TABS
1000.0000 mg | ORAL_TABLET | Freq: Four times a day (QID) | ORAL | Status: DC
  Administered 2020-04-15 – 2020-04-18 (×8): 1000 mg via ORAL
  Filled 2020-04-15 (×12): qty 2

## 2020-04-15 MED ORDER — ONDANSETRON HCL 4 MG/2ML IJ SOLN
4.0000 mg | Freq: Once | INTRAMUSCULAR | Status: AC
  Administered 2020-04-15: 4 mg via INTRAVENOUS
  Filled 2020-04-15: qty 2

## 2020-04-15 MED ORDER — SODIUM CHLORIDE 0.9 % IV SOLN
2.0000 g | INTRAVENOUS | Status: DC
  Administered 2020-04-15 – 2020-04-17 (×3): 2 g via INTRAVENOUS
  Filled 2020-04-15 (×4): qty 20

## 2020-04-15 MED ORDER — IOHEXOL 300 MG/ML  SOLN
100.0000 mL | Freq: Once | INTRAMUSCULAR | Status: AC | PRN
  Administered 2020-04-15: 100 mL via INTRAVENOUS

## 2020-04-15 MED ORDER — ACETAMINOPHEN 650 MG RE SUPP: 650.0000 mg | Freq: Four times a day (QID) | RECTAL | Status: DC | PRN

## 2020-04-15 MED ORDER — LISINOPRIL 5 MG PO TABS
5.0000 mg | ORAL_TABLET | Freq: Every day | ORAL | Status: DC
  Administered 2020-04-15 – 2020-04-17 (×3): 5 mg via ORAL
  Filled 2020-04-15 (×3): qty 1

## 2020-04-15 MED ORDER — ONDANSETRON HCL 4 MG/2ML IJ SOLN: 4.0000 mg | Freq: Four times a day (QID) | INTRAMUSCULAR | Status: DC | PRN

## 2020-04-15 MED ORDER — MORPHINE SULFATE (PF) 4 MG/ML IV SOLN
4.0000 mg | Freq: Once | INTRAVENOUS | Status: AC
  Administered 2020-04-15: 4 mg via INTRAVENOUS
  Filled 2020-04-15: qty 1

## 2020-04-15 MED ORDER — METOPROLOL TARTRATE 5 MG/5ML IV SOLN: 5.0000 mg | Freq: Four times a day (QID) | INTRAVENOUS | Status: DC | PRN

## 2020-04-15 MED ORDER — ACETAMINOPHEN 325 MG PO TABS: 650.0000 mg | ORAL_TABLET | Freq: Four times a day (QID) | ORAL | Status: DC | PRN

## 2020-04-15 MED ORDER — KETOROLAC TROMETHAMINE 30 MG/ML IJ SOLN
30.0000 mg | Freq: Once | INTRAMUSCULAR | Status: AC
  Administered 2020-04-15: 30 mg via INTRAVENOUS
  Filled 2020-04-15: qty 1

## 2020-04-15 MED ORDER — ENOXAPARIN SODIUM 40 MG/0.4ML ~~LOC~~ SOLN
40.0000 mg | SUBCUTANEOUS | Status: DC
  Administered 2020-04-15 – 2020-04-16 (×2): 40 mg via SUBCUTANEOUS
  Filled 2020-04-15 (×2): qty 0.4

## 2020-04-15 MED ORDER — MORPHINE SULFATE (PF) 2 MG/ML IV SOLN: 2.0000 mg | INTRAVENOUS | Status: DC | PRN

## 2020-04-15 MED ORDER — LORATADINE 10 MG PO TABS
10.0000 mg | ORAL_TABLET | Freq: Every day | ORAL | Status: DC
  Administered 2020-04-16 – 2020-04-18 (×2): 10 mg via ORAL
  Filled 2020-04-15 (×3): qty 1

## 2020-04-15 MED ORDER — LACTATED RINGERS IV SOLN: INTRAVENOUS | Status: DC

## 2020-04-15 MED ORDER — MUPIROCIN 2 % EX OINT: 1.0000 "application " | TOPICAL_OINTMENT | Freq: Two times a day (BID) | CUTANEOUS | Status: DC

## 2020-04-15 MED ORDER — PANTOPRAZOLE SODIUM 40 MG IV SOLR
40.0000 mg | Freq: Every day | INTRAVENOUS | Status: DC
  Administered 2020-04-15 – 2020-04-16 (×2): 40 mg via INTRAVENOUS
  Filled 2020-04-15 (×2): qty 40

## 2020-04-15 MED ORDER — DIPHENHYDRAMINE HCL 12.5 MG/5ML PO ELIX: 12.5000 mg | ORAL_SOLUTION | Freq: Four times a day (QID) | ORAL | Status: DC | PRN

## 2020-04-15 MED ORDER — ONDANSETRON 4 MG PO TBDP: 4.0000 mg | ORAL_TABLET | Freq: Four times a day (QID) | ORAL | Status: DC | PRN

## 2020-04-15 MED ORDER — OXYCODONE HCL 5 MG PO TABS: 5.0000 mg | ORAL_TABLET | ORAL | Status: DC | PRN

## 2020-04-15 MED ORDER — DOCUSATE SODIUM 100 MG PO CAPS
100.0000 mg | ORAL_CAPSULE | Freq: Two times a day (BID) | ORAL | Status: DC
  Administered 2020-04-15 – 2020-04-18 (×6): 100 mg via ORAL
  Filled 2020-04-15 (×7): qty 1

## 2020-04-15 MED ORDER — DIPHENHYDRAMINE HCL 50 MG/ML IJ SOLN: 12.5000 mg | Freq: Four times a day (QID) | INTRAMUSCULAR | Status: DC | PRN

## 2020-04-15 MED ORDER — SIMETHICONE 80 MG PO CHEW: 40.0000 mg | CHEWABLE_TABLET | Freq: Four times a day (QID) | ORAL | Status: DC | PRN

## 2020-04-15 NOTE — ED Provider Notes (Signed)
Signout from Dr. Lynn Ito.  64 year old male history of hypertension here with diffuse abdominal pain nausea vomiting that started around 10 PM last night and worsened overnight.  He has received a couple doses of pain medicine and said is still in pain.  Radiates through to back.  Lab work unrevealing.  Patient is pending CT abdomen and pelvis and urinalysis.  Disposition per results of testing. Physical Exam  BP (!) 193/92   Pulse 61   Temp 98 F (36.7 C) (Oral)   Resp 15   Ht 5\' 8"  (1.727 m)   Wt 95.7 kg   SpO2 97%   BMI 32.08 kg/m   Physical Exam  ED Course/Procedures     Procedures  MDM  I evaluated the patient at 8 AM.  He said he still having pain.  Abdomen is slightly distended without any focal guarding or rebound.  CT showing cholelithiasis otherwise unremarkable.  Have ordered him a right upper quadrant ultrasound and some Toradol.  Reassessed at 11 AM, patient states his pain is improved.  Ultrasound showing signs of possible acute cholecystitis.  Reviewed with patient.  Have consulted general surgery Dr. Constance Haw.  Patient seen by Dr. Constance Haw and she will admit to her service.       Bryan Rasmussen, MD 04/15/20 [phone]

## 2020-04-15 NOTE — H&P (Signed)
[facility] History and Physical  Reason for Referral: Acute cholecystitis  Referring Physician:  Dr. Melina Copa   Chief Complaint    Abdominal Pain      Bryan Shepard. is a 64 y.o. male.  HPI: Bryan Shepard is a 64 yo with a history of HTN, stroke in 2016 that had slurring that resolved. He comes in with generalized abdominal pain that started last night after eating hotdogs and progressed. He has had some associated nausea and vomiting in the EMS truck and at the hospital. He reports that he is otherwise healthy but did have a cardiac workup due to his HTN in [place] 2 years ago with stress test per his wife who is a retired Therapist, sports that was normal. He walks 1-2 miles without any chest pain or SOB.  He currently is having upper abdominal pain.  In the ED his labs were reassuring but his CT demonstrated a gallstone and his [place] was consistent with cholecystitis.   Past Medical History:  Diagnosis Date  . Acid reflux   . Colon polyps   . Family history of genetic mutation for hereditary nonpolyposis colorectal cancer (HNPCC)   . Family history of prostate cancer   . Family history of stomach cancer   . HOH (hard of hearing)   . Hypertension   . Sleep apnea    CPAP  . Stroke ([place]) 2015  . TIA (transient ischemic attack) 2016    Past Surgical History:  Procedure Laterality Date  . COLONOSCOPY  2018    Family History  Problem Relation Age of Onset  . Melanoma Mother 61  . Hypertension Mother   . Transient ischemic attack Mother   . Stroke Mother   . Heart disease Mother   . Prostate cancer Father 35  . Stomach cancer Father 25       PMS2+; Lynch syndrome  . Melanoma Father   . Diabetes Father   . Heart disease Father   . Stroke Maternal Grandmother   . Lung cancer Paternal Grandmother   . Throat cancer Paternal Grandmother   . Prostate cancer Paternal Grandfather 39  . Bladder Cancer Other        MGM's mother with urethra cancer    Social History    Tobacco Use  . Smoking status: Never Smoker  . Smokeless tobacco: Never Used  Vaping Use  . Vaping Use: Never used  Substance Use Topics  . Alcohol use: No  . Drug use: No    Medications: I have reviewed the patient's current medications. Current Facility-Administered Medications  Medication Dose Route Frequency Provider Last Rate Last Admin  . acetaminophen (TYLENOL) tablet 650 mg  650 mg Oral Q6H PRN Virl Cagey, MD       Or  . acetaminophen (TYLENOL) suppository 650 mg  650 mg Rectal Q6H PRN Virl Cagey, MD      . acetaminophen (TYLENOL) tablet 1,000 mg  1,000 mg Oral Q6H Virl Cagey, MD      . cefTRIAXone (ROCEPHIN) 2 g in sodium chloride 0.9 % 100 mL IVPB  2 g Intravenous Q24H Virl Cagey, MD      . diphenhydrAMINE (BENADRYL) 12.5 MG/5ML elixir 12.5 mg  12.5 mg Oral Q6H PRN Virl Cagey, MD       Or  . diphenhydrAMINE (BENADRYL) injection 12.5 mg  12.5 mg Intravenous Q6H PRN Virl Cagey, MD      . docusate sodium (COLACE) capsule 100 mg  100 mg Oral BID Virl Cagey, MD      . enoxaparin (LOVENOX) injection 40 mg  40 mg Subcutaneous Q24H Virl Cagey, MD      . lactated ringers infusion   Intravenous Continuous Virl Cagey, MD      . lisinopril (ZESTRIL) tablet 5 mg  5 mg Oral QHS Virl Cagey, MD      . Derrill Memo ON 04/16/2020] loratadine (CLARITIN) tablet 10 mg  10 mg Oral Daily Virl Cagey, MD      . Melatonin CAPS 5 mg  5 mg Oral QHS PRN Virl Cagey, MD      . metoprolol tartrate (LOPRESSOR) injection 5 mg  5 mg Intravenous Q6H PRN Virl Cagey, MD      . morphine 2 MG/ML injection 2 mg  2 mg Intravenous Q3H PRN Virl Cagey, MD      . ondansetron (ZOFRAN-ODT) disintegrating tablet 4 mg  4 mg Oral Q6H PRN Virl Cagey, MD       Or  . ondansetron [facility]) injection 4 mg  4 mg Intravenous Q6H PRN Virl Cagey, MD      . oxyCODONE (Oxy IR/ROXICODONE) immediate release tablet  5-10 mg  5-10 mg Oral Q4H PRN Virl Cagey, MD      . pantoprazole (PROTONIX) injection 40 mg  40 mg Intravenous QHS Virl Cagey, MD      . simethicone [facility]) chewable tablet 40 mg  40 mg Oral Q6H PRN Virl Cagey, MD       Current Outpatient Medications  Medication Sig Dispense Refill Last Dose  . aspirin EC 81 MG tablet Take 81 mg by mouth daily.   04/15/2020 at 0800  . Cholecalciferol (VITAMIN D3) 50 MCG (2000 UT) capsule Take by mouth.   04/15/2020 at Unknown time  . famotidine (PEPCID) 20 MG tablet Take 20 mg by mouth 2 (two) times daily.   04/15/2020 at Unknown time  . lisinopril (ZESTRIL) 5 MG tablet Take 1 tablet (5 mg total) by mouth daily. (Patient taking differently: Take 5 mg by mouth at bedtime. ) 30 tablet 2 04/14/2020 at Unknown time  . loratadine (CLARITIN) 10 MG tablet Take 1 tablet by mouth daily.   04/15/2020 at Unknown time  . Melatonin 5 MG CAPS Take 1 tablet by mouth daily.    04/14/2020 at Unknown time  . omega-3 acid ethyl esters (LOVAZA) 1 g capsule Take 2 g by mouth daily.   04/15/2020 at Unknown time  . triamcinolone ointment (KENALOG) 0.5 % Apply 1 application topically 2 (two) times daily. 30 g 0   . UNABLE TO FIND Tumeric capsule, 1 capsule once daily   04/15/2020 at Unknown time  . valACYclovir (VALTREX) 1000 MG tablet Take 1 tablet by mouth daily as needed.      No Known Allergies   ROS:  A comprehensive review of systems was negative except for: Gastrointestinal: positive for abdominal pain, nausea and vomiting  Blood pressure (!) 138/95, pulse 62, temperature 98 F (36.7 C), temperature source Oral, resp. rate 18, height '5\' 8"'  (1.727 m), weight 95.7 kg, SpO2 100 %. Physical Exam Vitals reviewed.  Constitutional:      Appearance: He is well-developed.  HENT:     Head: Normocephalic.  Eyes:     Extraocular Movements: Extraocular movements intact.  Cardiovascular:     Rate and Rhythm: Normal rate and regular rhythm.  Pulmonary:      Effort: Pulmonary  effort is normal.     Breath sounds: Normal breath sounds.  Abdominal:     General: There is distension.     Palpations: Abdomen is soft.     Tenderness: There is abdominal tenderness in the right upper quadrant and epigastric area.  Musculoskeletal:     Comments: Moves all extremities, no swelling  Skin:    General: Skin is warm and dry.  Neurological:     General: No focal deficit present.     Mental Status: He is alert and oriented to person, place, and time.  Psychiatric:        Mood and Affect: Mood normal.        Behavior: Behavior normal.     Results: Results for orders placed or performed during the hospital encounter of 04/15/20 (from the past 48 hour(s))  CBC with Differential/Platelet     Status: None   Collection Time: 04/15/20  5:27 AM  Result Value Ref Range   WBC 7.0 4.0 - 10.5 K/uL   RBC 5.63 4.22 - 5.81 MIL/uL   Hemoglobin 16.3 13.0 - 17.0 g/dL   HCT 49.2 39 - 52 %   MCV 87.4 80.0 - 100.0 fL   MCH 29.0 26.0 - 34.0 pg   MCHC 33.1 30.0 - 36.0 g/dL   RDW 12.4 11.5 - 15.5 %   Platelets 198 150 - 400 K/uL   nRBC 0.0 0.0 - 0.2 %   Neutrophils Relative % 70 %   Neutro Abs 4.9 1.7 - 7.7 K/uL   Lymphocytes Relative 23 %   Lymphs Abs 1.6 0.7 - 4.0 K/uL   Monocytes Relative 6 %   Monocytes Absolute 0.4 0 - 1 K/uL   Eosinophils Relative 1 %   Eosinophils Absolute 0.1 0 - 0 K/uL   Basophils Relative 0 %   Basophils Absolute 0.0 0 - 0 K/uL   Immature Granulocytes 0 %   Abs Immature Granulocytes 0.01 0.00 - 0.07 K/uL    Comment: Performed at [facility], [address] [place], [place] [address]  Comprehensive metabolic panel     Status: Abnormal   Collection Time: 04/15/20  5:27 AM  Result Value Ref Range   Sodium 137 135 - 145 mmol/L   Potassium 4.0 3.5 - 5.1 mmol/L   Chloride 104 98 - 111 mmol/L   CO2 24 22 - 32 mmol/L   Glucose, Bld 188 (H) 70 - 99 mg/dL    Comment: Glucose reference range applies only to samples taken after fasting for  at least 8 hours.   BUN 13 8 - 23 mg/dL   Creatinine, Ser 1.05 0.61 - 1.24 mg/dL   Calcium 9.0 8.9 - 10.3 mg/dL   Total Protein 6.6 6.5 - 8.1 g/dL   Albumin 3.9 3.5 - 5.0 g/dL   AST 27 15 - 41 U/L   ALT 43 0 - 44 U/L   Alkaline Phosphatase 56 38 - 126 U/L   Total Bilirubin 0.5 0.3 - 1.2 mg/dL   GFR calc non Af Amer >60 >60 mL/min   GFR calc Af Amer >60 >60 mL/min   Anion gap 9 5 - 15    Comment: Performed at [facility], [address], [place], [place] [address]  Lipase, blood     Status: None   Collection Time: 04/15/20  5:27 AM  Result Value Ref Range   Lipase 27 11 - 51 U/L    Comment: Performed at [facility], [address], [place], [place]  [address]  Urinalysis, Routine w reflex microscopic     Status: Abnormal   Collection Time: 04/15/20  7:48 AM  Result Value Ref Range   Color, Urine STRAW (A) YELLOW   APPearance CLEAR CLEAR   Specific Gravity, Urine 1.043 (H) 1.005 - 1.030   pH 7.0 5.0 - 8.0   Glucose, UA 150 (A) NEGATIVE mg/dL   Hgb urine dipstick NEGATIVE NEGATIVE   Bilirubin Urine NEGATIVE NEGATIVE   Ketones, ur NEGATIVE NEGATIVE mg/dL   Protein, ur NEGATIVE NEGATIVE mg/dL   Nitrite NEGATIVE NEGATIVE   Leukocytes,Ua NEGATIVE NEGATIVE    Comment: Performed at [facility], [address] [place], [place] [address]   Personally reviewed- dilated gallbladder with large stone, inflammation and thickening of wall CT ABDOMEN PELVIS W CONTRAST  Result Date: 04/15/2020 CLINICAL DATA:  Acute nonlocalized abdominal pain since last night EXAM: CT ABDOMEN AND PELVIS WITH CONTRAST TECHNIQUE: Multidetector CT imaging of the abdomen and pelvis was performed using the standard protocol following bolus administration of intravenous contrast. CONTRAST:  136m OMNIPAQUE IOHEXOL 300 MG/ML  SOLN COMPARISON:  09/13/2019 right upper quadrant ultrasound FINDINGS: Lower chest:  Coronary atherosclerosis. Hepatobiliary: No focal liver abnormality. Probable hepatic steatosis. Cholelithiasis.  No bile duct dilatation. Pancreas: Unremarkable. Spleen: Unremarkable. Adrenals/Urinary Tract: Negative adrenals. No hydronephrosis or stone. Prominent bladder wall thickness in the setting of collapse. Stomach/Bowel: No obstruction. No appendicitis. Mid duodenal diverticulum. Vascular/Lymphatic: No acute vascular abnormality. Atherosclerotic calcification of the aorta. No mass or adenopathy. Reproductive:Right hydrocele. Other: No ascites or pneumoperitoneum. Musculoskeletal: No acute abnormalities. Lower lumbar facet arthritis and diffuse endplate spurring. IMPRESSION: 1. No acute finding. 2. Cholelithiasis and hepatic steatosis. 3.  Aortic Atherosclerosis (ICD10-I70.0).  Coronary atherosclerosis. Electronically Signed   By: JMonte FantasiaM.D.   On: 04/15/2020 07:44   [place]domen Limited RUQ  Result Date: 04/15/2020 CLINICAL DATA:  Abdominal pain for 1 day. EXAM: ULTRASOUND ABDOMEN LIMITED RIGHT UPPER QUADRANT COMPARISON:  CT abdomen performed earlier today. FINDINGS: Gallbladder: 1.8 cm gallstone. Gallbladder wall appears thickened/edematous and there is small amount of pericholecystic fluid, suggesting acute cholecystitis. Common bile duct: Diameter: 3 mm Liver: Liver is echogenic indicating fatty infiltration. No focal liver mass or lesion is demonstrated. Portal vein is patent on color Doppler imaging with normal direction of blood flow towards the liver. Other: None. IMPRESSION: 1. Findings are suggestive of acute cholecystitis. 1.8 cm gallstone. 2. No bile duct dilatation. 3. Fatty infiltration of the liver. Electronically Signed   By: SFranki CabotM.D.   On: 04/15/2020 10:48     Assessment & Plan:  Bryan Shepard is a 64y.o. male with acute cholecystitis on imaging. Labs reassuring.   -Laparoscopic cholecystectomy Monday unless something changes and able to do Sunday -PRN for pain -Ceftriaxone for Acute cholecystitis -Labs in AM -LR @ 75 -SCDs, lovenox  -Clears  -EKG preop  ordered -COVID pending  -Discussed that if surgery is Monday morning that Dr. JArnoldo Moralemay be doing the case and patient understanding and in agreement  -PLAN: I counseled the patient about the indication, risks and benefits of laparoscopic cholecystectomy.  He understands there is a very small chance for bleeding, infection, injury to normal structures (including common bile duct), conversion to open surgery, persistent symptoms, evolution of postcholecystectomy diarrhea, need for secondary interventions, anesthesia reaction, cardiopulmonary issues and other risks not specifically detailed here. I described the expected recovery, the plan for follow-up and the restrictions during the recovery phase.  All questions were answered.  All questions were answered to the satisfaction of the patient and wife via phone.   Virl Cagey 04/15/2020, 12:20 PM

## 2020-04-15 NOTE — Plan of Care (Signed)

## 2020-04-15 NOTE — ED Notes (Signed)
Pt in bed, pt reports 2/10 abd pain, states that he doesn't need anything for pain at this time, pt offers no complaints, pt awaits transfer to floor.

## 2020-04-15 NOTE — Progress Notes (Signed)
[place] for lap chole Monday @ 730 with Dr. Arnoldo Morale.  Patient can have clears until Sunday night at midnight.  Curlene Labrum, MD

## 2020-04-15 NOTE — ED Notes (Signed)
Pt in bed, surgery at bedside,

## 2020-04-15 NOTE — ED Notes (Signed)
Pt states he has hx of stroke, pt states his BP has been elevated for 8 hours, some nausea en route w/ems.

## 2020-04-15 NOTE — ED Provider Notes (Signed)
[facility] EMERGENCY DEPARTMENT Provider Note   CSN: [id number] Arrival date & time: 04/15/20  0440     History Chief Complaint  Patient presents with  . Abdominal Pain    Lyrick Lagrand. is a 64 y.o. male.  Patient presents to the emergency department for evaluation of abdominal pain.  Patient reports that he started to notice pain around 10:30 PM.  Patient reports that he has a pain that is diffuse and he cannot really describe it.  No associated nausea, vomiting, diarrhea or constipation.  He has not had any fever.  Denies urinary symptoms.  Patient reports a history of hypertension, currently treated with lisinopril.  He was noted to have moderate hypertension during transport.  He is not experiencing any chest pain, shortness of breath, heart palpitations, headache or blurred vision.        Past Medical History:  Diagnosis Date  . Acid reflux   . Colon polyps   . Family history of genetic mutation for hereditary nonpolyposis colorectal cancer (HNPCC)   . Family history of prostate cancer   . Family history of stomach cancer   . HOH (hard of hearing)   . Hypertension   . Sleep apnea    CPAP  . Stroke ([place]) 2015  . TIA (transient ischemic attack) 2016    Patient Active Problem List   Diagnosis Date Noted  . Genetic testing 01/02/2018  . Family history of genetic mutation for hereditary nonpolyposis colorectal cancer (HNPCC)   . Family history of prostate cancer   . Family history of stomach cancer   . CVA (cerebral vascular accident) ([place]) 06/26/2015  . Hyperlipidemia 06/26/2015  . Sleep apnea with use of continuous positive airway pressure (CPAP) 06/26/2015    Past Surgical History:  Procedure Laterality Date  . COLONOSCOPY  2018       Family History  Problem Relation Age of Onset  . Melanoma Mother 36  . Hypertension Mother   . Transient ischemic attack Mother   . Stroke Mother   . Heart disease Mother   . Prostate cancer Father 21  . Stomach  cancer Father 61       PMS2+; Lynch syndrome  . Melanoma Father   . Diabetes Father   . Heart disease Father   . Stroke Maternal Grandmother   . Lung cancer Paternal Grandmother   . Throat cancer Paternal Grandmother   . Prostate cancer Paternal Grandfather 83  . Bladder Cancer Other        MGM's mother with urethra cancer    Social History   Tobacco Use  . Smoking status: Never Smoker  . Smokeless tobacco: Never Used  Vaping Use  . Vaping Use: Never used  Substance Use Topics  . Alcohol use: No  . Drug use: No    Home Medications Prior to Admission medications   Medication Sig Start Date End Date Taking? Authorizing Provider  aspirin EC 81 MG tablet Take 81 mg by mouth daily.    [provider]  Cholecalciferol (VITAMIN D3) 50 MCG (2000 UT) capsule Take by mouth.    [provider]  famotidine (PEPCID) 20 MG tablet Take 20 mg by mouth 2 (two) times daily.    [provider]  lisinopril (ZESTRIL) 5 MG tablet Take 1 tablet (5 mg total) by mouth daily. 10/27/19   Loman Brooklyn, FNP  loratadine (CLARITIN) 10 MG tablet Take 1 tablet by mouth daily.    [provider]  Melatonin  5 MG CAPS Take 0.5 tablets by mouth daily.    [provider]  triamcinolone ointment (KENALOG) 0.5 % Apply 1 application topically 2 (two) times daily. 10/22/19   Loman Brooklyn, FNP  UNABLE TO FIND Tumeric capsule, 1 capsule once daily    [provider]  valACYclovir (VALTREX) 1000 MG tablet Take 1 tablet by mouth daily as needed. 10/19/19   [provider]  Vitamin A 2400 MCG (8000 UT) CAPS Take by mouth. 1 a day    [provider]    Allergies    Patient has no known allergies.  Review of Systems   Review of Systems  Gastrointestinal: Positive for abdominal pain.  All other systems reviewed and are negative.   Physical Exam Updated Vital Signs BP (!) 200/103 (BP Location: Right Arm)   Pulse 76   Temp 98 F (36.7 C)  (Oral)   Resp 20   Ht '5\' 8"'  (1.727 m)   Wt 95.7 kg   SpO2 100%   BMI 32.08 kg/m   Physical Exam Vitals and nursing note reviewed.  Constitutional:      General: He is not in acute distress.    Appearance: Normal appearance. He is well-developed.  HENT:     Head: Normocephalic and atraumatic.     Right Ear: Hearing normal.     Left Ear: Hearing normal.     Nose: Nose normal.  Eyes:     Conjunctiva/sclera: Conjunctivae normal.     Pupils: Pupils are equal, round, and reactive to light.  Cardiovascular:     Rate and Rhythm: Regular rhythm.     Heart sounds: S1 normal and S2 normal. No murmur heard.  No friction rub. No gallop.   Pulmonary:     Effort: Pulmonary effort is normal. No respiratory distress.     Breath sounds: Normal breath sounds.  Chest:     Chest wall: No tenderness.  Abdominal:     General: Bowel sounds are normal.     Palpations: Abdomen is soft.     Tenderness: There is generalized abdominal tenderness. There is no guarding or rebound. Negative signs include Murphy's sign and McBurney's sign.     Hernia: No hernia is present.  Musculoskeletal:        General: Normal range of motion.     Cervical back: Normal range of motion and neck supple.  Skin:    General: Skin is warm and dry.     Findings: No rash.  Neurological:     Mental Status: He is alert and oriented to person, place, and time.     GCS: GCS eye subscore is 4. GCS verbal subscore is 5. GCS motor subscore is 6.     Cranial Nerves: No cranial nerve deficit.     Sensory: No sensory deficit.     Coordination: Coordination normal.  Psychiatric:        Speech: Speech normal.        Behavior: Behavior normal.        Thought Content: Thought content normal.     ED Results / Procedures / Treatments   Labs (all labs ordered are listed, but only abnormal results are displayed) Labs Reviewed  COMPREHENSIVE METABOLIC PANEL - Abnormal; Notable for the following components:      Result Value    Glucose, Bld 188 (*)    All other components within normal limits  CBC WITH DIFFERENTIAL/PLATELET  LIPASE, BLOOD  URINALYSIS, ROUTINE W REFLEX MICROSCOPIC  EKG None  Radiology No results found.  Procedures Procedures (including critical care time)  Medications Ordered in ED Medications  morphine 4 MG/ML injection 4 mg (has no administration in time range)  ondansetron (ZOFRAN) injection 4 mg (4 mg Intravenous Given 04/15/20 0525)  morphine 4 MG/ML injection 4 mg (4 mg Intravenous Given 04/15/20 0525)  iohexol (OMNIPAQUE) 300 MG/ML solution 100 mL (100 mLs Intravenous Contrast Given 04/15/20 0656)    ED Course  I have reviewed the triage vital signs and the nursing notes.  Pertinent labs & imaging results that were available during my care of the patient were reviewed by me and considered in my medical decision making (see chart for details).    MDM Rules/Calculators/A&P                          Patient presents to the emergency department for evaluation of abdominal pain.  Patient reports that pain began around 10:30 PM.  He has had some mild nausea since the ambulance ride but has not had any vomiting.  Patient denies previous similar symptoms.  Abdominal exam revealed some very mild generalized tenderness, no guarding or rebound.  No signs of an acute surgical process.  Lab work was reassuring.  CT abdomen and pelvis performed.  Pending at end of shift.  Case signed out to oncoming ER physician to follow-up on CT scan and disposition accordingly.  Patient does have a history of high blood pressure.  He has noticed that his blood pressure has been elevated today.  This is of concern to him because he does have a history of a stroke.  He has been taking his medications as prescribed.  Patient reports that he has increased his activity and has been exercising daily, trying to eat right.  His elevated blood pressure today is likely multifactorial.  He is asymptomatic.  Final  Clinical Impression(s) / ED Diagnoses Final diagnoses:  Generalized abdominal pain    Rx / DC Orders ED Discharge Orders    None       Amoree Newlon, Gwenyth Allegra, MD 04/16/20 0502

## 2020-04-15 NOTE — ED Triage Notes (Signed)
Pt arrives from home via [facility] w/complaints of abdominal pain since 10:30pm last night. Per EMS pt was hypertensive en route. Pt states pain is all over abdomen.

## 2020-04-16 ENCOUNTER — Telehealth: Payer: Self-pay | Admitting: Family Medicine

## 2020-04-16 DIAGNOSIS — G4730 Sleep apnea, unspecified: Secondary | ICD-10-CM

## 2020-04-16 LAB — CBC
HCT: 47.3 % (ref 39.0–52.0)
Hemoglobin: 15.5 g/dL (ref 13.0–17.0)
MCH: 29.1 pg (ref 26.0–34.0)
MCHC: 32.8 g/dL (ref 30.0–36.0)
MCV: 88.7 fL (ref 80.0–100.0)
Platelets: 200 10*3/uL (ref 150–400)
RBC: 5.33 MIL/uL (ref 4.22–5.81)
RDW: 12.9 % (ref 11.5–15.5)
WBC: 6.5 10*3/uL (ref 4.0–10.5)
nRBC: 0 % (ref 0.0–0.2)

## 2020-04-16 LAB — COMPREHENSIVE METABOLIC PANEL
ALT: 41 U/L (ref 0–44)
AST: 24 U/L (ref 15–41)
Albumin: 3.9 g/dL (ref 3.5–5.0)
Alkaline Phosphatase: 58 U/L (ref 38–126)
Anion gap: 7 (ref 5–15)
BUN: 13 mg/dL (ref 8–23)
CO2: 30 mmol/L (ref 22–32)
Calcium: 8.9 mg/dL (ref 8.9–10.3)
Chloride: 102 mmol/L (ref 98–111)
Creatinine, Ser: 1.25 mg/dL — ABNORMAL HIGH (ref 0.61–1.24)
GFR calc Af Amer: >60 mL/min (ref >60)
GFR calc non Af Amer: >60 mL/min (ref >60)
Glucose, Bld: 122 mg/dL — ABNORMAL HIGH (ref 70–99)
Potassium: 3.9 mmol/L (ref 3.5–5.1)
Sodium: 139 mmol/L (ref 135–145)
Total Bilirubin: 0.9 mg/dL (ref 0.3–1.2)
Total Protein: 6.5 g/dL (ref 6.5–8.1)

## 2020-04-16 LAB — HIV ANTIBODY (ROUTINE TESTING W REFLEX): HIV Screen 4th Generation wRfx: NONREACTIVE

## 2020-04-16 MED ORDER — HEPARIN SODIUM (PORCINE) 5000 UNIT/ML IJ SOLN: 5000.0000 [IU] | Freq: Three times a day (TID) | INTRAMUSCULAR | Status: DC

## 2020-04-16 MED ORDER — CHLORHEXIDINE GLUCONATE CLOTH 2 % EX PADS
6.0000 | MEDICATED_PAD | Freq: Once | CUTANEOUS | Status: AC
  Administered 2020-04-16: 6 via TOPICAL

## 2020-04-16 MED ORDER — HEPARIN SODIUM (PORCINE) 5000 UNIT/ML IJ SOLN
5000.0000 [IU] | Freq: Three times a day (TID) | INTRAMUSCULAR | Status: DC
  Administered 2020-04-17 – 2020-04-18 (×3): 5000 [IU] via SUBCUTANEOUS
  Filled 2020-04-16 (×3): qty 1

## 2020-04-16 MED ORDER — SODIUM CHLORIDE 0.9 % IV SOLN
2.0000 g | INTRAVENOUS | Status: AC
  Administered 2020-04-17: 2 g via INTRAVENOUS
  Filled 2020-04-16 (×2): qty 2

## 2020-04-16 NOTE — Progress Notes (Signed)
[facility] Progress Note     Subjective: Pain improved. Doing some clears.   Objective: Vital signs in last 24 hours: Temp:  [97.7 F (36.5 C)-98.4 F (36.9 C)] 97.7 F (36.5 C) (07/11 0512) Pulse Rate:  [55-63] 62 (07/11 1037) Resp:  [16-20] 20 (07/11 1037) BP: (120-169)/(72-98) 169/93 (07/11 1037) SpO2:  [95 %-99 %] 98 % (07/11 1037) Last BM Date: 04/13/20  Intake/Output from previous day: 07/10 0701 - 07/11 0700 In: 1342.7 [P.O.:240; I.V.:1002.7; IV Piggyback:100] Out: -  Intake/Output this shift: No intake/output data recorded.  General appearance: alert, cooperative and no distress Resp: normal work of breathing GI: soft, nondistended, tender upper abdomen  Lab Results:  Recent Labs    04/15/20 0527 04/16/20 0628  WBC 7.0 6.5  HGB 16.3 15.5  HCT 49.2 47.3  PLT 198 200   BMET Recent Labs    04/15/20 0527 04/16/20 0628  NA 137 139  K 4.0 3.9  CL 104 102  CO2 24 30  GLUCOSE 188* 122*  BUN 13 13  CREATININE 1.05 1.25*  CALCIUM 9.0 8.9   PT/INR No results for input(s): LABPROT, INR in the last 72 hours.  Studies/Results: CT ABDOMEN PELVIS W CONTRAST  Result Date: 04/15/2020 CLINICAL DATA:  Acute nonlocalized abdominal pain since last night EXAM: CT ABDOMEN AND PELVIS WITH CONTRAST TECHNIQUE: Multidetector CT imaging of the abdomen and pelvis was performed using the standard protocol following bolus administration of intravenous contrast. CONTRAST:  182mL OMNIPAQUE IOHEXOL 300 MG/ML  SOLN COMPARISON:  09/13/2019 right upper quadrant ultrasound FINDINGS: Lower chest:  Coronary atherosclerosis. Hepatobiliary: No focal liver abnormality. Probable hepatic steatosis. Cholelithiasis. No bile duct dilatation. Pancreas: Unremarkable. Spleen: Unremarkable. Adrenals/Urinary Tract: Negative adrenals. No hydronephrosis or stone. Prominent bladder wall thickness in the setting of collapse. Stomach/Bowel: No obstruction. No appendicitis. Mid duodenal  diverticulum. Vascular/Lymphatic: No acute vascular abnormality. Atherosclerotic calcification of the aorta. No mass or adenopathy. Reproductive:Right hydrocele. Other: No ascites or pneumoperitoneum. Musculoskeletal: No acute abnormalities. Lower lumbar facet arthritis and diffuse endplate spurring. IMPRESSION: 1. No acute finding. 2. Cholelithiasis and hepatic steatosis. 3.  Aortic Atherosclerosis (ICD10-I70.0).  Coronary atherosclerosis. Electronically Signed   By: Monte Fantasia M.D.   On: 04/15/2020 07:44   US Abdomen Limited RUQ  Result Date: 04/15/2020 CLINICAL DATA:  Abdominal pain for 1 day. EXAM: ULTRASOUND ABDOMEN LIMITED RIGHT UPPER QUADRANT COMPARISON:  CT abdomen performed earlier today. FINDINGS: Gallbladder: 1.8 cm gallstone. Gallbladder wall appears thickened/edematous and there is small amount of pericholecystic fluid, suggesting acute cholecystitis. Common bile duct: Diameter: 3 mm Liver: Liver is echogenic indicating fatty infiltration. No focal liver mass or lesion is demonstrated. Portal vein is patent on color Doppler imaging with normal direction of blood flow towards the liver. Other: None. IMPRESSION: 1. Findings are suggestive of acute cholecystitis. 1.8 cm gallstone. 2. No bile duct dilatation. 3. Fatty infiltration of the liver. Electronically Signed   By: Franki Cabot M.D.   On: 04/15/2020 10:48    Anti-infectives: Anti-infectives (From admission, onward)   Start     Dose/Rate Route Frequency Ordered Stop   04/17/20 0600  cefoTEtan (CEFOTAN) 2 g in sodium chloride 0.9 % 100 mL IVPB     Discontinue     2 g 200 mL/hr over 30 Minutes Intravenous On call to O.R. 04/16/20 1207 04/18/20 0559   04/15/20 1115  cefTRIAXone (ROCEPHIN) 2 g in sodium chloride 0.9 % 100 mL IVPB     Discontinue     2 g 200  mL/hr over 30 Minutes Intravenous Every 24 hours 04/15/20 1110        Assessment/Plan: Bryan Shepard is a 64 yo with acute cholecystitis. Doing better on rocephin. -PLAN: I  counseled the patient about the indication, risks and benefits of laparoscopic cholecystectomy.  He understands there is a very small chance for bleeding, infection, injury to normal structures (including common bile duct), conversion to open surgery, persistent symptoms, evolution of postcholecystectomy diarrhea, need for secondary interventions, anesthesia reaction, cardiopulmonary issues and other risks not specifically detailed here. I described the expected recovery, the plan for follow-up and the restrictions during the recovery phase.  All questions were answered.  OR tomorrow with Dr. Arnoldo Morale NPO midnight EKG ordered for preop Creatinine up slightly, stopped lovenox now, he received today dose and adjust for heparin tomorrow q8  Bmp in AM, LR @ 75   LOS: 1 day    Bryan Shepard 04/16/2020

## 2020-04-16 NOTE — Telephone Encounter (Signed)
Patient sent a letter requesting a referral for sleep apnea. He does not recall a sleep study in the last five years. His CPAP is making a noise that is keeping him from sleeping.   Please let patient know the referral was placed. Leave a VM - he will not answer the phone.

## 2020-04-16 NOTE — H&P (View-Only) (Signed)
[facility] Progress Note     Subjective: Pain improved. Doing some clears.   Objective: Vital signs in last 24 hours: Temp:  [97.7 F (36.5 C)-98.4 F (36.9 C)] 97.7 F (36.5 C) (07/11 0512) Pulse Rate:  [55-63] 62 (07/11 1037) Resp:  [16-20] 20 (07/11 1037) BP: (120-169)/(72-98) 169/93 (07/11 1037) SpO2:  [95 %-99 %] 98 % (07/11 1037) Last BM Date: 04/13/20  Intake/Output from previous day: 07/10 0701 - 07/11 0700 In: 1342.7 [P.O.:240; I.V.:1002.7; IV Piggyback:100] Out: -  Intake/Output this shift: No intake/output data recorded.  General appearance: alert, cooperative and no distress Resp: normal work of breathing GI: soft, nondistended, tender upper abdomen  Lab Results:  Recent Labs    04/15/20 0527 04/16/20 0628  WBC 7.0 6.5  HGB 16.3 15.5  HCT 49.2 47.3  PLT 198 200   BMET Recent Labs    04/15/20 0527 04/16/20 0628  NA 137 139  K 4.0 3.9  CL 104 102  CO2 24 30  GLUCOSE 188* 122*  BUN 13 13  CREATININE 1.05 1.25*  CALCIUM 9.0 8.9   PT/INR No results for input(s): LABPROT, INR in the last 72 hours.  Studies/Results: CT ABDOMEN PELVIS W CONTRAST  Result Date: 04/15/2020 CLINICAL DATA:  Acute nonlocalized abdominal pain since last night EXAM: CT ABDOMEN AND PELVIS WITH CONTRAST TECHNIQUE: Multidetector CT imaging of the abdomen and pelvis was performed using the standard protocol following bolus administration of intravenous contrast. CONTRAST:  183mL OMNIPAQUE IOHEXOL 300 MG/ML  SOLN COMPARISON:  09/13/2019 right upper quadrant ultrasound FINDINGS: Lower chest:  Coronary atherosclerosis. Hepatobiliary: No focal liver abnormality. Probable hepatic steatosis. Cholelithiasis. No bile duct dilatation. Pancreas: Unremarkable. Spleen: Unremarkable. Adrenals/Urinary Tract: Negative adrenals. No hydronephrosis or stone. Prominent bladder wall thickness in the setting of collapse. Stomach/Bowel: No obstruction. No appendicitis. Mid duodenal  diverticulum. Vascular/Lymphatic: No acute vascular abnormality. Atherosclerotic calcification of the aorta. No mass or adenopathy. Reproductive:Right hydrocele. Other: No ascites or pneumoperitoneum. Musculoskeletal: No acute abnormalities. Lower lumbar facet arthritis and diffuse endplate spurring. IMPRESSION: 1. No acute finding. 2. Cholelithiasis and hepatic steatosis. 3.  Aortic Atherosclerosis (ICD10-I70.0).  Coronary atherosclerosis. Electronically Signed   By: Monte Fantasia M.D.   On: 04/15/2020 07:44   US Abdomen Limited RUQ  Result Date: 04/15/2020 CLINICAL DATA:  Abdominal pain for 1 day. EXAM: ULTRASOUND ABDOMEN LIMITED RIGHT UPPER QUADRANT COMPARISON:  CT abdomen performed earlier today. FINDINGS: Gallbladder: 1.8 cm gallstone. Gallbladder wall appears thickened/edematous and there is small amount of pericholecystic fluid, suggesting acute cholecystitis. Common bile duct: Diameter: 3 mm Liver: Liver is echogenic indicating fatty infiltration. No focal liver mass or lesion is demonstrated. Portal vein is patent on color Doppler imaging with normal direction of blood flow towards the liver. Other: None. IMPRESSION: 1. Findings are suggestive of acute cholecystitis. 1.8 cm gallstone. 2. No bile duct dilatation. 3. Fatty infiltration of the liver. Electronically Signed   By: Franki Cabot M.D.   On: 04/15/2020 10:48    Anti-infectives: Anti-infectives (From admission, onward)   Start     Dose/Rate Route Frequency Ordered Stop   04/17/20 0600  cefoTEtan (CEFOTAN) 2 g in sodium chloride 0.9 % 100 mL IVPB     Discontinue     2 g 200 mL/hr over 30 Minutes Intravenous On call to O.R. 04/16/20 1207 04/18/20 0559   04/15/20 1115  cefTRIAXone (ROCEPHIN) 2 g in sodium chloride 0.9 % 100 mL IVPB     Discontinue     2 g 200  mL/hr over 30 Minutes Intravenous Every 24 hours 04/15/20 1110        Assessment/Plan: Bryan Shepard is a 64 yo with acute cholecystitis. Doing better on rocephin. -PLAN: I  counseled the patient about the indication, risks and benefits of laparoscopic cholecystectomy.  He understands there is a very small chance for bleeding, infection, injury to normal structures (including common bile duct), conversion to open surgery, persistent symptoms, evolution of postcholecystectomy diarrhea, need for secondary interventions, anesthesia reaction, cardiopulmonary issues and other risks not specifically detailed here. I described the expected recovery, the plan for follow-up and the restrictions during the recovery phase.  All questions were answered.  OR tomorrow with Dr. Arnoldo Morale NPO midnight EKG ordered for preop Creatinine up slightly, stopped lovenox now, he received today dose and adjust for heparin tomorrow q8  Bmp in AM, LR @ 75   LOS: 1 day    Virl Cagey 04/16/2020

## 2020-04-16 NOTE — Plan of Care (Signed)
  Problem: Pain Managment: Goal: General experience of comfort will improve Outcome: Progressing   Problem: Safety: Goal: Ability to remain free from injury will improve Outcome: Completed/Met   Problem: Skin Integrity: Goal: Risk for impaired skin integrity will decrease Outcome: Completed/Met   

## 2020-04-17 ENCOUNTER — Inpatient Hospital Stay (HOSPITAL_COMMUNITY): Admitting: Certified Registered"

## 2020-04-17 ENCOUNTER — Encounter (HOSPITAL_COMMUNITY): Payer: Self-pay | Admitting: General Surgery

## 2020-04-17 ENCOUNTER — Encounter (HOSPITAL_COMMUNITY)
Admission: EM | Disposition: A | Discharge status: Home / Self Care | Payer: Self-pay | Source: Home / Self Care | Attending: General Surgery

## 2020-04-17 HISTORY — PX: CHOLECYSTECTOMY: SHX55

## 2020-04-17 LAB — BASIC METABOLIC PANEL
Anion gap: 9 (ref 5–15)
BUN: 11 mg/dL (ref 8–23)
CO2: 28 mmol/L (ref 22–32)
Calcium: 8.9 mg/dL (ref 8.9–10.3)
Chloride: 102 mmol/L (ref 98–111)
Creatinine, Ser: 1.14 mg/dL (ref 0.61–1.24)
GFR calc Af Amer: >60 mL/min (ref >60)
GFR calc non Af Amer: >60 mL/min (ref >60)
Glucose, Bld: 112 mg/dL — ABNORMAL HIGH (ref 70–99)
Potassium: 3.7 mmol/L (ref 3.5–5.1)
Sodium: 139 mmol/L (ref 135–145)

## 2020-04-17 SURGERY — LAPAROSCOPIC CHOLECYSTECTOMY
Anesthesia: General | Site: Abdomen

## 2020-04-17 MED ORDER — DEXAMETHASONE SODIUM PHOSPHATE 10 MG/ML IJ SOLN
INTRAMUSCULAR | Status: AC
  Filled 2020-04-17: qty 1

## 2020-04-17 MED ORDER — ONDANSETRON HCL 4 MG/2ML IJ SOLN
INTRAMUSCULAR | Status: DC | PRN
  Administered 2020-04-17: 4 mg via INTRAVENOUS

## 2020-04-17 MED ORDER — HEMOSTATIC AGENTS (NO CHARGE) OPTIME
TOPICAL | Status: DC | PRN
  Administered 2020-04-17: 1 via TOPICAL

## 2020-04-17 MED ORDER — ROCURONIUM BROMIDE 100 MG/10ML IV SOLN
INTRAVENOUS | Status: DC | PRN
Start: 2020-04-17 — End: 2020-04-17
  Administered 2020-04-17: 40 mg via INTRAVENOUS

## 2020-04-17 MED ORDER — SUCCINYLCHOLINE CHLORIDE 20 MG/ML IJ SOLN
INTRAMUSCULAR | Status: DC | PRN
  Administered 2020-04-17: 140 mg via INTRAVENOUS

## 2020-04-17 MED ORDER — SODIUM CHLORIDE 0.9 % IR SOLN
Status: DC | PRN
  Administered 2020-04-17: 1

## 2020-04-17 MED ORDER — BUPIVACAINE LIPOSOME 1.3 % IJ SUSP
INTRAMUSCULAR | Status: AC
  Filled 2020-04-17: qty 20

## 2020-04-17 MED ORDER — KETOROLAC TROMETHAMINE 30 MG/ML IJ SOLN: 30.0000 mg | Freq: Four times a day (QID) | INTRAMUSCULAR | Status: DC | PRN

## 2020-04-17 MED ORDER — ONDANSETRON HCL 4 MG/2ML IJ SOLN
INTRAMUSCULAR | Status: AC
  Filled 2020-04-17: qty 2

## 2020-04-17 MED ORDER — SEVOFLURANE IN SOLN
RESPIRATORY_TRACT | Status: AC
  Filled 2020-04-17: qty 250

## 2020-04-17 MED ORDER — PANTOPRAZOLE SODIUM 40 MG PO TBEC
40.0000 mg | DELAYED_RELEASE_TABLET | Freq: Every day | ORAL | Status: DC
  Administered 2020-04-17: 40 mg via ORAL
  Filled 2020-04-17 (×2): qty 1

## 2020-04-17 MED ORDER — PROPOFOL 10 MG/ML IV BOLUS
INTRAVENOUS | Status: AC
  Filled 2020-04-17: qty 20

## 2020-04-17 MED ORDER — BUPIVACAINE LIPOSOME 1.3 % IJ SUSP
INTRAMUSCULAR | Status: DC | PRN
  Administered 2020-04-17: 20 mL

## 2020-04-17 MED ORDER — LACTATED RINGERS IV SOLN: INTRAVENOUS | Status: DC

## 2020-04-17 MED ORDER — ESMOLOL HCL 100 MG/10ML IV SOLN
INTRAVENOUS | Status: DC | PRN
  Administered 2020-04-17 (×5): 20 mg via INTRAVENOUS

## 2020-04-17 MED ORDER — ONDANSETRON HCL 4 MG/2ML IJ SOLN: 4.0000 mg | Freq: Once | INTRAMUSCULAR | Status: DC | PRN

## 2020-04-17 MED ORDER — FENTANYL CITRATE (PF) 100 MCG/2ML IJ SOLN
INTRAMUSCULAR | Status: AC
  Filled 2020-04-17: qty 2

## 2020-04-17 MED ORDER — HYDROMORPHONE HCL 1 MG/ML IJ SOLN
INTRAMUSCULAR | Status: AC
  Filled 2020-04-17: qty 0.5

## 2020-04-17 MED ORDER — SUCCINYLCHOLINE CHLORIDE 200 MG/10ML IV SOSY
PREFILLED_SYRINGE | INTRAVENOUS | Status: AC
  Filled 2020-04-17: qty 10

## 2020-04-17 MED ORDER — HYDROMORPHONE HCL 1 MG/ML IJ SOLN: 0.2500 mg | INTRAMUSCULAR | Status: DC | PRN

## 2020-04-17 MED ORDER — KETOROLAC TROMETHAMINE 30 MG/ML IJ SOLN
INTRAMUSCULAR | Status: AC
  Filled 2020-04-17: qty 1

## 2020-04-17 MED ORDER — PROPOFOL 500 MG/50ML IV EMUL
INTRAVENOUS | Status: DC | PRN
Start: 2020-04-17 — End: 2020-04-17
  Administered 2020-04-17: 50 mg via INTRAVENOUS
  Administered 2020-04-17: 150 mg via INTRAVENOUS
  Administered 2020-04-17 (×2): 50 mg via INTRAVENOUS

## 2020-04-17 MED ORDER — KETOROLAC TROMETHAMINE 30 MG/ML IJ SOLN
30.0000 mg | Freq: Four times a day (QID) | INTRAMUSCULAR | Status: AC
  Administered 2020-04-17 (×2): 30 mg via INTRAVENOUS

## 2020-04-17 MED ORDER — HYDRALAZINE HCL 20 MG/ML IJ SOLN
10.0000 mg | Freq: Once | INTRAMUSCULAR | Status: AC
  Administered 2020-04-17: 10 mg via INTRAVENOUS

## 2020-04-17 MED ORDER — DEXAMETHASONE SODIUM PHOSPHATE 4 MG/ML IJ SOLN
INTRAMUSCULAR | Status: DC | PRN
  Administered 2020-04-17: 4 mg via INTRAVENOUS

## 2020-04-17 MED ORDER — LIDOCAINE 2% (20 MG/ML) 5 ML SYRINGE
INTRAMUSCULAR | Status: AC
  Filled 2020-04-17: qty 5

## 2020-04-17 MED ORDER — HYDROMORPHONE HCL 1 MG/ML IJ SOLN: 1.0000 mg | INTRAMUSCULAR | Status: DC | PRN

## 2020-04-17 MED ORDER — CHLORHEXIDINE GLUCONATE 0.12 % MT SOLN
OROMUCOSAL | Status: AC
  Filled 2020-04-17: qty 15

## 2020-04-17 MED ORDER — ORAL CARE MOUTH RINSE: 15.0000 mL | Freq: Once | OROMUCOSAL | Status: AC

## 2020-04-17 MED ORDER — SUGAMMADEX SODIUM 200 MG/2ML IV SOLN
INTRAVENOUS | Status: DC | PRN
  Administered 2020-04-17: 200 mg via INTRAVENOUS

## 2020-04-17 MED ORDER — LIDOCAINE HCL (CARDIAC) PF 100 MG/5ML IV SOSY
PREFILLED_SYRINGE | INTRAVENOUS | Status: DC | PRN
  Administered 2020-04-17: 100 mg via INTRAVENOUS

## 2020-04-17 MED ORDER — MIDAZOLAM HCL 2 MG/2ML IJ SOLN
INTRAMUSCULAR | Status: AC
  Filled 2020-04-17: qty 2

## 2020-04-17 MED ORDER — CHLORHEXIDINE GLUCONATE 0.12 % MT SOLN
15.0000 mL | Freq: Once | OROMUCOSAL | Status: AC
  Administered 2020-04-17: 15 mL via OROMUCOSAL

## 2020-04-17 MED ORDER — HYDRALAZINE HCL 20 MG/ML IJ SOLN
INTRAMUSCULAR | Status: AC
  Filled 2020-04-17: qty 1

## 2020-04-17 MED ORDER — ROCURONIUM BROMIDE 10 MG/ML (PF) SYRINGE
PREFILLED_SYRINGE | INTRAVENOUS | Status: AC
  Filled 2020-04-17: qty 10

## 2020-04-17 MED ORDER — ESMOLOL HCL 100 MG/10ML IV SOLN
INTRAVENOUS | Status: AC
  Filled 2020-04-17: qty 10

## 2020-04-17 MED ORDER — FENTANYL CITRATE (PF) 100 MCG/2ML IJ SOLN
INTRAMUSCULAR | Status: DC | PRN
  Administered 2020-04-17: 100 ug via INTRAVENOUS

## 2020-04-17 MED ORDER — MIDAZOLAM HCL 5 MG/5ML IJ SOLN
INTRAMUSCULAR | Status: DC | PRN
  Administered 2020-04-17: 2 mg via INTRAVENOUS

## 2020-04-17 MED ORDER — LACTATED RINGERS IV SOLN: INTRAVENOUS | Status: DC | PRN

## 2020-04-17 SURGICAL SUPPLY — 52 items
APPLICATOR ARISTA FLEXITIP XL (MISCELLANEOUS) IMPLANT
APPLIER CLIP ROT 10 11.4 M/L (STAPLE) ×3
BAG RETRIEVAL 10 (BASKET) ×1
BAG RETRIEVAL 10MM (BASKET) ×1
CHLORAPREP W/TINT 26 (MISCELLANEOUS) ×1 IMPLANT
CLIP APPLIE ROT 10 11.4 M/L (STAPLE) ×1 IMPLANT
CLOTH BEACON ORANGE TIMEOUT ST (SAFETY) ×3 IMPLANT
COVER LIGHT HANDLE STERIS (MISCELLANEOUS) ×6 IMPLANT
COVER WAND RF STERILE (DRAPES) ×3 IMPLANT
DERMABOND ADVANCED (GAUZE/BANDAGES/DRESSINGS) ×2
DERMABOND ADVANCED .7 DNX12 (GAUZE/BANDAGES/DRESSINGS) ×1 IMPLANT
DURAPREP 26ML APPLICATOR (WOUND CARE) ×2 IMPLANT
ELECT REM PT RETURN 9FT ADLT (ELECTROSURGICAL) ×3
ELECTRODE REM PT RTRN 9FT ADLT (ELECTROSURGICAL) ×1 IMPLANT
GLOVE BIO SURGEON STRL SZ7 (GLOVE) ×2 IMPLANT
GLOVE BIO SURGEON STRL SZ7.5 (GLOVE) ×2 IMPLANT
GLOVE BIOGEL PI IND STRL 7.0 (GLOVE) ×2 IMPLANT
GLOVE BIOGEL PI IND STRL 7.5 (GLOVE) IMPLANT
GLOVE BIOGEL PI INDICATOR 7.0 (GLOVE) ×4
GLOVE BIOGEL PI INDICATOR 7.5 (GLOVE) ×2
GLOVE SURG SS PI 6.5 STRL IVOR (GLOVE) ×2 IMPLANT
GLOVE SURG SS PI 7.5 STRL IVOR (GLOVE) ×3 IMPLANT
GOWN STRL REUS W/ TWL XL LVL3 (GOWN DISPOSABLE) IMPLANT
GOWN STRL REUS W/TWL LRG LVL3 (GOWN DISPOSABLE) ×9 IMPLANT
GOWN STRL REUS W/TWL XL LVL3 (GOWN DISPOSABLE) ×2
HEMOSTAT ARISTA ABSORB 3G PWDR (HEMOSTASIS) IMPLANT
HEMOSTAT SNOW SURGICEL 2X4 (HEMOSTASIS) ×3 IMPLANT
INST SET LAPROSCOPIC AP (KITS) ×3 IMPLANT
KIT TURNOVER KIT A (KITS) ×3 IMPLANT
MANIFOLD NEPTUNE II (INSTRUMENTS) ×3 IMPLANT
NDL HYPO 18GX1.5 BLUNT FILL (NEEDLE) ×1 IMPLANT
NDL INSUFFLATION 14GA 120MM (NEEDLE) ×1 IMPLANT
NEEDLE HYPO 18GX1.5 BLUNT FILL (NEEDLE) ×3 IMPLANT
NEEDLE HYPO 22GX1.5 SAFETY (NEEDLE) ×3 IMPLANT
NEEDLE INSUFFLATION 14GA 120MM (NEEDLE) ×3 IMPLANT
NS IRRIG 1000ML POUR BTL (IV SOLUTION) ×3 IMPLANT
PACK LAP CHOLE LZT030E (CUSTOM PROCEDURE TRAY) ×3 IMPLANT
PAD ARMBOARD 7.5X6 YLW CONV (MISCELLANEOUS) ×3 IMPLANT
SET BASIN LINEN APH (SET/KITS/TRAYS/PACK) ×3 IMPLANT
SET TUBE SMOKE EVAC HIGH FLOW (TUBING) ×3 IMPLANT
SLEEVE ENDOPATH XCEL 5M (ENDOMECHANICALS) ×3 IMPLANT
SUT MNCRL AB 4-0 PS2 18 (SUTURE) ×8 IMPLANT
SUT VICRYL 0 UR6 27IN ABS (SUTURE) ×5 IMPLANT
SYR 20ML LL LF (SYRINGE) ×6 IMPLANT
SYS BAG RETRIEVAL 10MM (BASKET) ×1
SYSTEM BAG RETRIEVAL 10MM (BASKET) ×1 IMPLANT
TROCAR ENDO BLADELESS 11MM (ENDOMECHANICALS) ×3 IMPLANT
TROCAR XCEL NON-BLD 5MMX100MML (ENDOMECHANICALS) ×3 IMPLANT
TROCAR XCEL UNIV SLVE 11M 100M (ENDOMECHANICALS) ×3 IMPLANT
TUBE CONNECTING 12'X1/4 (SUCTIONS) ×1
TUBE CONNECTING 12X1/4 (SUCTIONS) ×2 IMPLANT
WARMER LAPAROSCOPE (MISCELLANEOUS) ×3 IMPLANT

## 2020-04-17 NOTE — Anesthesia Procedure Notes (Signed)
Procedure Name: Intubation Performed by: Tacy Learn, CRNA Pre-anesthesia Checklist: Patient identified, Emergency Drugs available, Suction available, Patient being monitored and Timeout performed Patient Re-evaluated:Patient Re-evaluated prior to induction Oxygen Delivery Method: Circle system utilized Preoxygenation: Pre-oxygenation with 100% oxygen Induction Type: IV induction Laryngoscope Size: Miller and 2 Grade View: Grade II Tube type: Oral Tube size: 7.5 mm Airway Equipment and Method: Stylet Placement Confirmation: ETT inserted through vocal cords under direct vision,  positive ETCO2,  CO2 detector and breath sounds checked- equal and bilateral Secured at: 23 cm Tube secured with: Tape Dental Injury: Teeth and Oropharynx as per pre-operative assessment

## 2020-04-17 NOTE — Anesthesia Preprocedure Evaluation (Signed)
Anesthesia Evaluation  Patient identified by MRN, date of birth, ID band Patient awake    Reviewed: Allergy & Precautions, H&P , NPO status , Patient's Chart, lab work & pertinent test results, reviewed documented beta blocker date and time   Airway Mallampati: I  TM Distance: >3 FB Neck ROM: full    Dental no notable dental hx. (+) Teeth Intact   Pulmonary neg pulmonary ROS,    Pulmonary exam normal breath sounds clear to auscultation       Cardiovascular Exercise Tolerance: Good hypertension, negative cardio ROS   Rhythm:regular Rate:Normal     Neuro/Psych TIACVA, No Residual Symptoms negative psych ROS   GI/Hepatic Neg liver ROS, GERD  Medicated,  Endo/Other  negative endocrine ROS  Renal/GU negative Renal ROS  negative genitourinary   Musculoskeletal   Abdominal   Peds  Hematology negative hematology ROS (+)   Anesthesia Other Findings   Reproductive/Obstetrics negative OB ROS                             Anesthesia Physical Anesthesia Plan  ASA: III  Anesthesia Plan: General   Post-op Pain Management:    Induction:   PONV Risk Score and Plan:   Airway Management Planned:   Additional Equipment:   Intra-op Plan:   Post-operative Plan:   Informed Consent: I have reviewed the patients History and Physical, chart, labs and discussed the procedure including the risks, benefits and alternatives for the proposed anesthesia with the patient or authorized representative who has indicated his/her understanding and acceptance.     Dental Advisory Given  Plan Discussed with: CRNA  Anesthesia Plan Comments:         Anesthesia Quick Evaluation

## 2020-04-17 NOTE — Transfer of Care (Signed)
Immediate Anesthesia Transfer of Care Note  Patient: Bryan Shepard.  Procedure(s) Performed: LAPAROSCOPIC CHOLECYSTECTOMY (N/A Abdomen)  Patient Location: PACU  Anesthesia Type:General  Level of Consciousness: awake, alert , oriented and patient cooperative  Airway & Oxygen Therapy: Patient Spontanous Breathing and Patient connected to nasal cannula oxygen  Post-op Assessment: Report given to RN, Post -op Vital signs reviewed and stable and Patient moving all extremities  Post vital signs: Reviewed and stable  Last Vitals:  Vitals Value Taken Time  BP 166/96 04/17/20 0845  Temp 36.4 C 04/17/20 0845  Pulse 78 04/17/20 0845  Resp 19 04/17/20 0845  SpO2 94 % 04/17/20 0845  Vitals shown include unvalidated device data.  Last Pain:  Vitals:   04/17/20 0640  TempSrc:   PainSc: 0-No pain         Complications: No complications documented.

## 2020-04-17 NOTE — Op Note (Signed)
Patient:  Bryan Shepard.  DOB:  July 31, 1956  MRN:  [id number]   Preop Diagnosis: Acute cholecystitis, cholelithiasis  Postop Diagnosis: Same  Procedure: Laparoscopic cholecystectomy  Surgeon: [name], MD  Anes: General endotracheal  Indications: Patient is a 63 year old white male who presents with acute cholecystitis secondary to cholelithiasis.  The risks and benefits of the procedure including bleeding, infection, hepatobiliary injury, and the possibility of an open procedure were fully explained to the patient, who gave informed consent.  Procedure note: The patient was placed in the supine position.  After induction of general tracheal anesthesia, the abdomen was prepped and draped using the usual sterile technique with ChloraPrep.  Surgical site confirmation was performed.  A supraumbilical incision was made down to the fascia.  A Veress needle was introduced into the abdominal cavity and confirmation of placement was done using the saline drop test.  The abdomen was then insufflated to 15 mmHg pressure.  An 11 mm trocar was introduced into the abdominal cavity under direct visualization without difficulty.  The patient was placed in reverse Trendelenburg position and an additional 11 mm trocar was placed in the epigastric region and 5 mm trochars were placed in the right upper quadrant and right flank regions.  Liver was inspected noted to be within normal limits.  The gallbladder was noted to be distended and acutely inflamed.  The gallbladder was retracted in a dynamic fashion in order to provide a critical view of the triangle of Calot.  The cystic duct was first identified.  Its junction with the infundibulum was fully identified.  In order to provide the critical view, a dome down approach was also used.  The cystic artery was ligated using clips.  The cystic duct was fully identified.  Its junction to the infundibulum was fully identified.  Endoclips were placed proximally  distally on the cystic duct, the cystic duct was divided.  The gallbladder had been freed away from the gallbladder fossa using Bovie electrocautery.  The gallbladder was delivered through the epigastric trocar site using an Endo Catch bag.  Arista and Surgicel were placed in the gallbladder fossa.  No abnormal bleeding or bile leakage was noted.  All fluid and air were then evacuated from the abdominal cavity prior to the removal of the trochars.  All wounds were irrigated with normal saline.  All wounds were injected with Exparel.  The supraumbilical fascia as well as epigastric fascia were reapproximated using 0 Vicryl interrupted sutures.  All skin incisions were closed using a 4-0 Monocryl subcuticular suture.  Dermabond was applied.  All tape needle counts were correct at the end of the procedure.  The patient was extubated in the operating room and transferred to PACU in stable condition.  Complications: None  EBL: Minimal  Specimen: Gallbladder

## 2020-04-17 NOTE — Anesthesia Postprocedure Evaluation (Signed)
Anesthesia Post Note  Patient: Bryan Shepard.  Procedure(s) Performed: LAPAROSCOPIC CHOLECYSTECTOMY (N/A Abdomen)  Patient location during evaluation: PACU Anesthesia Type: General Level of consciousness: awake, oriented, awake and alert and patient cooperative Pain management: pain level controlled Vital Signs Assessment: post-procedure vital signs reviewed and stable Respiratory status: nonlabored ventilation, spontaneous breathing and respiratory function stable Cardiovascular status: blood pressure returned to baseline and stable Postop Assessment: no headache and no backache Anesthetic complications: no   No complications documented.   Last Vitals:  Vitals:   04/17/20 0640 04/17/20 0845  BP: (!) 176/96 (!) 166/96  Pulse: 67 78  Resp: 12 18  Temp: (!) 36.3 C 36.4 C  SpO2: 97% 94%    Last Pain:  Vitals:   04/17/20 0640  TempSrc:   PainSc: 0-No pain                 Tacy Learn

## 2020-04-17 NOTE — Interval H&P Note (Signed)
History and Physical Interval Note:  04/17/2020 7:16 AM  Bryan Shepard.  has presented today for surgery, with the diagnosis of acute cholecystitis.  The various methods of treatment have been discussed with the patient and family. After consideration of risks, benefits and other options for treatment, the patient has consented to  Procedure(s): LAPAROSCOPIC CHOLECYSTECTOMY (N/A) as a surgical intervention.  The patient's history has been reviewed, patient examined, no change in status, stable for surgery.  I have reviewed the patient's chart and labs.  Questions were answered to the patient's satisfaction.     [name]

## 2020-04-17 NOTE — Telephone Encounter (Signed)
Left detailed message.   

## 2020-04-18 ENCOUNTER — Encounter (HOSPITAL_COMMUNITY): Payer: Self-pay | Admitting: General Surgery

## 2020-04-18 LAB — CBC
HCT: 41.2 % (ref 39.0–52.0)
Hemoglobin: 13.6 g/dL (ref 13.0–17.0)
MCH: 28.5 pg (ref 26.0–34.0)
MCHC: 33 g/dL (ref 30.0–36.0)
MCV: 86.2 fL (ref 80.0–100.0)
Platelets: 183 10*3/uL (ref 150–400)
RBC: 4.78 MIL/uL (ref 4.22–5.81)
RDW: 12.3 % (ref 11.5–15.5)
WBC: 13.1 10*3/uL — ABNORMAL HIGH (ref 4.0–10.5)
nRBC: 0 % (ref 0.0–0.2)

## 2020-04-18 LAB — COMPREHENSIVE METABOLIC PANEL
ALT: 42 U/L (ref 0–44)
AST: 34 U/L (ref 15–41)
Albumin: 3.2 g/dL — ABNORMAL LOW (ref 3.5–5.0)
Alkaline Phosphatase: 46 U/L (ref 38–126)
Anion gap: 8 (ref 5–15)
BUN: 17 mg/dL (ref 8–23)
CO2: 24 mmol/L (ref 22–32)
Calcium: 8.3 mg/dL — ABNORMAL LOW (ref 8.9–10.3)
Chloride: 105 mmol/L (ref 98–111)
Creatinine, Ser: 1.34 mg/dL — ABNORMAL HIGH (ref 0.61–1.24)
GFR calc Af Amer: >60 mL/min (ref >60)
GFR calc non Af Amer: 56 mL/min — ABNORMAL LOW (ref >60)
Glucose, Bld: 150 mg/dL — ABNORMAL HIGH (ref 70–99)
Potassium: 3.8 mmol/L (ref 3.5–5.1)
Sodium: 137 mmol/L (ref 135–145)
Total Bilirubin: 0.7 mg/dL (ref 0.3–1.2)
Total Protein: 5.6 g/dL — ABNORMAL LOW (ref 6.5–8.1)

## 2020-04-18 LAB — SURGICAL PATHOLOGY

## 2020-04-18 MED ORDER — HYDROCODONE-ACETAMINOPHEN 5-325 MG PO TABS: 1.0000 | ORAL_TABLET | ORAL | 0 refills | Status: DC | PRN

## 2020-04-18 NOTE — Discharge Summary (Signed)
Physician Discharge Summary  Patient ID: Bryan Shepard. MRN: [id number] DOB/AGE: 63-Nov-1957 63 y.o.  Admit date: 04/15/2020 Discharge date: 04/18/2020  Admission Diagnoses: Acute cholecystitis, cholelithiasis  Discharge Diagnoses: Same Principal Problem:   Acute cholecystitis Hypertension, history of CVA  Discharged Condition: good  Hospital Course: Patient is a 64 year old white male who presented to the emergency room with acute onset of right upper quadrant abdominal pain.  Ultrasound and CT scan of the abdomen revealed acute cholecystitis with cholelithiasis.  Patient was also noted to have significant hypertension.  He underwent a laparoscopic cholecystectomy on 04/17/2020.  He tolerated the surgery well.  His postoperative course has been unremarkable.  His blood pressure has normalized.  His liver tests are within normal limits.  He does have mild leukocytosis which is secondary to surgical intervention.  The patient is being discharged home on 04/18/2020 in good and improving condition.  Treatments: surgery: Laparoscopic cholecystectomy on 04/17/2020  Discharge Exam: Blood pressure 122/73, pulse 76, temperature 98 F (36.7 C), temperature source Oral, resp. rate 17, height 5\' 8"  (1.727 m), weight 95.7 kg, SpO2 94 %. General appearance: alert, cooperative and no distress Resp: clear to auscultation bilaterally Cardio: regular rate and rhythm, S1, S2 normal, no murmur, click, rub or gallop GI: Soft, incisions healing well.  Disposition: Discharge disposition: 01-Home or Self Care       Discharge Instructions    Diet - low sodium heart healthy   Complete by: As directed    Increase activity slowly   Complete by: As directed      Allergies as of 04/18/2020   No Known Allergies     Medication List    TAKE these medications   aspirin EC 81 MG tablet Take 81 mg by mouth daily.   famotidine 20 MG tablet Commonly known as: PEPCID Take 20 mg by mouth 2 (two) times  daily.   HYDROcodone-acetaminophen 5-325 MG tablet Commonly known as: Norco Take 1 tablet by mouth every 4 (four) hours as needed for moderate pain.   lisinopril 5 MG tablet Commonly known as: ZESTRIL Take 1 tablet (5 mg total) by mouth daily. What changed: when to take this   loratadine 10 MG tablet Commonly known as: CLARITIN Take 1 tablet by mouth daily.   Melatonin 5 MG Caps Take 1 tablet by mouth daily.   omega-3 acid ethyl esters 1 g capsule Commonly known as: LOVAZA Take 2 g by mouth daily.   triamcinolone ointment 0.5 % Commonly known as: KENALOG Apply 1 application topically 2 (two) times daily.   UNABLE TO FIND Tumeric capsule, 1 capsule once daily   valACYclovir 1000 MG tablet Commonly known as: VALTREX Take 1 tablet by mouth daily as needed.   Vitamin D3 50 MCG (2000 UT) capsule Take by mouth.       Follow-up Information    [name], MD Follow up.   Specialty: General Surgery Why: Will call you next week for follow up Contact information: [id number] [place] [address] [phone]               Signed: [name] 04/18/2020, 9:18 AM

## 2020-04-18 NOTE — Discharge Instructions (Signed)
Laparoscopic Cholecystectomy, Care After This sheet gives you information about how to care for yourself after your procedure. Your health care provider may also give you more specific instructions. If you have problems or questions, contact your health care provider. What can I expect after the procedure? After the procedure, it is common to have:  Pain at your incision sites. You will be given medicines to control this pain.  Mild nausea or vomiting.  Bloating and possible shoulder pain from the air-like gas that was used during the procedure. Follow these instructions at home: Incision care   Follow instructions from your health care provider about how to take care of your incisions. Make sure you: ? Wash your hands with soap and water before you change your bandage (dressing). If soap and water are not available, use hand sanitizer. ? Change your dressing as told by your health care provider. ? Leave stitches (sutures), skin glue, or adhesive strips in place. These skin closures may need to be in place for 2 weeks or longer. If adhesive strip edges start to loosen and curl up, you may trim the loose edges. Do not remove adhesive strips completely unless your health care provider tells you to do that.  Do not take baths, swim, or use a hot tub until your health care provider approves. Ask your health care provider if you can take showers. You may only be allowed to take sponge baths for bathing.  Check your incision area every day for signs of infection. Check for: ? More redness, swelling, or pain. ? More fluid or blood. ? Warmth. ? Pus or a bad smell. Activity  Do not drive or use heavy machinery while taking prescription pain medicine.  Do not lift anything that is heavier than 10 lb (4.5 kg) until your health care provider approves.  Do not play contact sports until your health care provider approves.  Do not drive for 24 hours if you were given a medicine to help you relax  (sedative).  Rest as needed. Do not return to work or school until your health care provider approves. General instructions  Take over-the-counter and prescription medicines only as told by your health care provider.  To prevent or treat constipation while you are taking prescription pain medicine, your health care provider may recommend that you: ? Drink enough fluid to keep your urine clear or pale yellow. ? Take over-the-counter or prescription medicines. ? Eat foods that are high in fiber, such as fresh fruits and vegetables, whole grains, and beans. ? Limit foods that are high in fat and processed sugars, such as fried and sweet foods. Contact a health care provider if:  You develop a rash.  You have more redness, swelling, or pain around your incisions.  You have more fluid or blood coming from your incisions.  Your incisions feel warm to the touch.  You have pus or a bad smell coming from your incisions.  You have a fever.  One or more of your incisions breaks open. Get help right away if:  You have trouble breathing.  You have chest pain.  You have increasing pain in your shoulders.  You faint or feel dizzy when you stand.  You have severe pain in your abdomen.  You have nausea or vomiting that lasts for more than one day.  You have leg pain. This information is not intended to replace advice given to you by your health care provider. Make sure you discuss any questions you have   with your health care provider. Document Revised: 09/05/2017 Document Reviewed: 03/11/2016 Elsevier Patient Education  2020 Elsevier Inc.  

## 2020-04-20 ENCOUNTER — Telehealth: Payer: Self-pay | Admitting: Family Medicine

## 2020-04-20 NOTE — Telephone Encounter (Signed)
Appt scheduled for 7/23 at 2:20PM.  Patient aware

## 2020-04-26 ENCOUNTER — Telehealth (INDEPENDENT_AMBULATORY_CARE_PROVIDER_SITE_OTHER): Payer: Self-pay | Admitting: General Surgery

## 2020-04-26 DIAGNOSIS — Z09 Encounter for follow-up examination after completed treatment for conditions other than malignant neoplasm: Secondary | ICD-10-CM

## 2020-04-27 ENCOUNTER — Telehealth (INDEPENDENT_AMBULATORY_CARE_PROVIDER_SITE_OTHER): Payer: Self-pay | Admitting: General Surgery

## 2020-04-27 DIAGNOSIS — Z09 Encounter for follow-up examination after completed treatment for conditions other than malignant neoplasm: Secondary | ICD-10-CM

## 2020-04-27 NOTE — Progress Notes (Signed)
Subjective:     Bryan Shepard.  Telephone postoperative visit performed.  Patient states he is doing well.  He has no significant complaints. Objective:    There were no vitals taken for this visit.  General:  alert, cooperative and no distress       Assessment:    Doing well postoperatively.    Plan:   Instructed to call should any problems arise.  Follow-up as needed.  Total time 2 minutes.

## 2020-04-28 ENCOUNTER — Ambulatory Visit (INDEPENDENT_AMBULATORY_CARE_PROVIDER_SITE_OTHER): Admitting: Family Medicine

## 2020-04-28 ENCOUNTER — Other Ambulatory Visit: Payer: Self-pay

## 2020-04-28 ENCOUNTER — Encounter: Payer: Self-pay | Admitting: Family Medicine

## 2020-04-28 VITALS — BP 137/86 | HR 90 | Temp 97.1°F | Ht 68.0 in | Wt 207.2 lb

## 2020-04-28 DIAGNOSIS — Z8719 Personal history of other diseases of the digestive system: Secondary | ICD-10-CM

## 2020-04-28 DIAGNOSIS — E669 Obesity, unspecified: Secondary | ICD-10-CM

## 2020-04-28 DIAGNOSIS — G4730 Sleep apnea, unspecified: Secondary | ICD-10-CM | POA: Diagnosis not present

## 2020-04-28 DIAGNOSIS — I10 Essential (primary) hypertension: Secondary | ICD-10-CM

## 2020-04-28 DIAGNOSIS — Z9049 Acquired absence of other specified parts of digestive tract: Secondary | ICD-10-CM

## 2020-04-28 NOTE — Progress Notes (Signed)
Assessment & Plan:  1-2. History of acute cholecystitis/Status post cholecystectomy - Doing well postoperatively. Advised patient he may do light exercises and walking, but no strenuous exercise or activity that increases abdominal pressure for 6 weeks after his surgery.  3. Sleep apnea with use of continuous positive airway pressure (CPAP) - Keep appointment with pulmonology on 06/08/2020 for management of sleep apnea.  4. Essential hypertension - Well controlled on current regimen.  Encouraged to continue dietary changes and weight loss efforts.  Patient to keep track of his blood pressure at home and let me know if he is consistently > 140/90.   5. Obesity (BMI 30.0-34.9) - Encouraged to continue diet and weight loss efforts.  Save strenuous exercise until he is at least 6 weeks postop.   Follow up plan: Return in about 6 months (around 10/29/2020) for annual physical.  Hendricks Limes, MSN, APRN, FNP-C [facility]  Subjective:   Patient ID: Bryan Shepard., male    DOB: Aug 05, 1956, 64 y.o.   MRN: [id number]  HPI: [name] is a 64 y.o. male presenting on 04/28/2020 for Hospitalization Follow-up (AP 7/10- Abd pain)  Patient here for follow-up at [facility] from 04/15/2020 - 04/18/2020 due to acute cholecystitis.  Patient underwent laparoscopic cholecystectomy on 04/17/2020.  He has had his postop telephone visit with the surgeon.  Everything is going well.  Patient's blood pressure was elevated significantly in the hospital, and he reports it was elevated prior to going, but since having his gallbladder removed his blood pressure has been normal.  He has been eating a low-salt diet and trying to lose weight.  Patient is not currently using his CPAP due to the noises it is making.  He does have an appointment scheduled to establish with pulmonology for repeat sleep study.   ROS: Negative unless specifically indicated above in HPI.   Relevant past medical  history reviewed and updated as indicated.   Allergies and medications reviewed and updated.   Current Outpatient Medications:  .  aspirin EC 81 MG tablet, Take 81 mg by mouth daily., Disp: , Rfl:  .  Cholecalciferol (VITAMIN D3) 50 MCG (2000 UT) capsule, Take by mouth., Disp: , Rfl:  .  famotidine (PEPCID) 20 MG tablet, Take 20 mg by mouth 2 (two) times daily., Disp: , Rfl:  .  lisinopril (ZESTRIL) 5 MG tablet, Take 1 tablet (5 mg total) by mouth daily. (Patient taking differently: Take 5 mg by mouth at bedtime. ), Disp: 30 tablet, Rfl: 2 .  loratadine (CLARITIN) 10 MG tablet, Take 1 tablet by mouth daily., Disp: , Rfl:  .  Melatonin 5 MG CAPS, Take 1 tablet by mouth daily. , Disp: , Rfl:  .  omega-3 acid ethyl esters (LOVAZA) 1 g capsule, Take 2 g by mouth daily., Disp: , Rfl:  .  triamcinolone ointment (KENALOG) 0.5 %, Apply 1 application topically 2 (two) times daily., Disp: 30 g, Rfl: 0 .  UNABLE TO FIND, Tumeric capsule, 1 capsule once daily, Disp: , Rfl:  .  valACYclovir (VALTREX) 1000 MG tablet, Take 1 tablet by mouth daily as needed., Disp: , Rfl:   No Known Allergies  Objective:   BP (!) 137/86   Pulse 90   Temp (!) 97.1 F (36.2 C) (Temporal)   Ht 5\' 8"  (1.727 m)   Wt (!) 207 lb 3.2 oz (94 kg)   SpO2 97%   BMI 31.50 kg/m    Physical Exam Vitals  reviewed.  Constitutional:      General: He is not in acute distress.    Appearance: Normal appearance. He is not ill-appearing, toxic-appearing or diaphoretic.  HENT:     Head: Normocephalic and atraumatic.  Eyes:     General: No scleral icterus.       Right eye: No discharge.        Left eye: No discharge.     Conjunctiva/sclera: Conjunctivae normal.  Cardiovascular:     Rate and Rhythm: Normal rate and regular rhythm.     Heart sounds: Normal heart sounds. No murmur heard.  No friction rub. No gallop.   Pulmonary:     Effort: Pulmonary effort is normal. No respiratory distress.     Breath sounds: Normal breath  sounds. No stridor. No wheezing, rhonchi or rales.  Abdominal:     Comments: Surgical incisions well approximated and healing.  No erythema, warmth, or drainage.  Musculoskeletal:        General: Normal range of motion.     Cervical back: Normal range of motion.  Skin:    General: Skin is warm and dry.  Neurological:     Mental Status: He is alert and oriented to person, place, and time. Mental status is at baseline.  Psychiatric:        Mood and Affect: Mood normal.        Behavior: Behavior normal.        Thought Content: Thought content normal.        Judgment: Judgment normal.

## 2020-04-30 ENCOUNTER — Encounter: Payer: Self-pay | Admitting: Family Medicine

## 2020-04-30 DIAGNOSIS — I152 Hypertension secondary to endocrine disorders: Secondary | ICD-10-CM | POA: Insufficient documentation

## 2020-04-30 DIAGNOSIS — I10 Essential (primary) hypertension: Secondary | ICD-10-CM | POA: Insufficient documentation

## 2020-04-30 DIAGNOSIS — E669 Obesity, unspecified: Secondary | ICD-10-CM | POA: Insufficient documentation

## 2020-05-01 NOTE — Telephone Encounter (Signed)
No answer

## 2020-05-11 ENCOUNTER — Ambulatory Visit: Admitting: Family Medicine

## 2020-06-08 ENCOUNTER — Ambulatory Visit (INDEPENDENT_AMBULATORY_CARE_PROVIDER_SITE_OTHER): Admitting: Pulmonary Disease

## 2020-06-08 ENCOUNTER — Other Ambulatory Visit: Payer: Self-pay

## 2020-06-08 ENCOUNTER — Encounter: Payer: Self-pay | Admitting: Pulmonary Disease

## 2020-06-08 VITALS — BP 142/88 | HR 72 | Temp 97.1°F | Ht 67.0 in | Wt 210.2 lb

## 2020-06-08 DIAGNOSIS — Z9989 Dependence on other enabling machines and devices: Secondary | ICD-10-CM | POA: Diagnosis not present

## 2020-06-08 DIAGNOSIS — G4730 Sleep apnea, unspecified: Secondary | ICD-10-CM | POA: Diagnosis not present

## 2020-06-08 DIAGNOSIS — G4733 Obstructive sleep apnea (adult) (pediatric): Secondary | ICD-10-CM | POA: Diagnosis not present

## 2020-06-08 NOTE — Progress Notes (Signed)
Subjective:    Patient ID: Bryan Shepard., male    DOB: 12-20-1955, 64 y.o.   MRN: [id number]  HPI  64 year old presents to establish care for obstructive sleep apnea. He has moved back to [place] from [place] in 2020.  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 PMH -hypertension on one medication, CVA x2 His wife is a Marine scientist.  He underwent sleep evaluation in 2015 due to snoring and excessive daytime somnolence and fatigue, was found to have mild OSA on home sleep test and placed on CPAP with nasal pillows.  He brings his CPAP machine today. Compliance report from 6/5 to 04/09/2020 was reviewed which shows average compliance of 4 hours per night, good control of events settings are unclear but seems to be auto settings. For the last month he has been noticing humming sound in his machine and would like a new machine.  Epworth sleepiness score is 11, he reports sleepiness while sitting and reading, lying down to rest in the afternoon or watching TV. Bedtime is between 10 PM and midnight, sleep latency is about 30 minutes, he takes melatonin and occasionally acetaminophen 6 50 mg for foot pain, reports 1-2 nocturnal awakenings, he sleeps on his stomach with 2 pillows, is out of bed around 9 AM feeling rested with dryness of mouth but denies headaches    Significant tests/ events reviewed  HST 08/2014 -wt 203 pounds -AHI 9.4/hour lowest desaturation 78%    Past Medical History:  Diagnosis Date  . Acid reflux   . Colon polyps   . Family history of genetic mutation for hereditary nonpolyposis colorectal cancer (HNPCC)   . Family history of prostate cancer   . Family history of stomach cancer   . HOH (hard of hearing)   . Hypertension   . Sleep apnea    CPAP  . Stroke ([place]) 2015  . TIA (transient ischemic attack) 2016    Past Surgical History:  Procedure Laterality Date  . CHOLECYSTECTOMY N/A 04/17/2020    Procedure: LAPAROSCOPIC CHOLECYSTECTOMY;  Surgeon: [name], MD;  Location: [facility];  Service: General;  Laterality: N/A;  . COLONOSCOPY  2018    No Known Allergies   Social History   Socioeconomic History  . Marital status: Married    Spouse name: [name]  . Number of children: Not on file  . Years of education: Not on file  . Highest education level: Not on file  Occupational History  . Occupation: retired     Comment: [employer]   Tobacco Use  . Smoking status: Never Smoker  . Smokeless tobacco: Never Used  Vaping Use  . Vaping Use: Never used  Substance and Sexual Activity  . Alcohol use: No  . Drug use: No  . Sexual activity: Yes    Birth control/protection: None  Other Topics Concern  . Not on file  Social History Narrative   Married , 1 step daughter    Social Determinants of Health   Financial Resource Strain:   . Difficulty of Paying Living Expenses: Not on file  Food Insecurity:   . Worried About Charity fundraiser in the Last Year: Not on file  . Ran Out of Food in the Last Year: Not on file  Transportation Needs:   . Lack of Transportation (Medical): Not on file  . Lack of Transportation (Non-Medical): Not on file  Physical Activity:   . Days  of Exercise per Week: Not on file  . Minutes of Exercise per Session: Not on file  Stress:   . Feeling of Stress : Not on file  Social Connections:   . Frequency of Communication with Friends and Family: Not on file  . Frequency of Social Gatherings with Friends and Family: Not on file  . Attends Religious Services: Not on file  . Active Member of Clubs or Organizations: Not on file  . Attends Archivist Meetings: Not on file  . Marital Status: Not on file  Intimate Partner Violence:   . Fear of Current or Ex-Partner: Not on file  . Emotionally Abused: Not on file  . Physically Abused: Not on file  . Sexually Abused: Not on file     Family History  Problem Relation Age of Onset  . Melanoma  Mother 55  . Hypertension Mother   . Transient ischemic attack Mother   . Stroke Mother   . Heart disease Mother   . Prostate cancer Father 49  . Stomach cancer Father 47       PMS2+; Lynch syndrome  . Melanoma Father   . Diabetes Father   . Heart disease Father   . Stroke Maternal Grandmother   . Lung cancer Paternal Grandmother   . Throat cancer Paternal Grandmother   . Prostate cancer Paternal Grandfather 7  . Bladder Cancer Other        MGM's mother with urethra cancer    Review of Systems Shortness of breath with activity Acid heartburn Abdominal plain which resolved with removal of gallbladder    Objective:   Physical Exam   Gen. Pleasant, obese, in no distress ENT - no lesions, no post nasal drip Neck: No JVD, no thyromegaly, no carotid bruits Lungs: no use of accessory muscles, no dullness to percussion, decreased without rales or rhonchi  Cardiovascular: Rhythm regular, heart sounds  normal, no murmurs or gallops, no peripheral edema Musculoskeletal: No deformities, no cyanosis or clubbing , no tremors        Assessment & Plan:

## 2020-06-08 NOTE — Assessment & Plan Note (Signed)
The pathophysiology of obstructive sleep apnea , it's cardiovascular consequences & modes of treatment including CPAP were discused with the patient in detail & they evidenced understanding.  He had mild OSA but given his CVA and hypertension, likely needs treatment.  Also has excessive daytime fatigue and somnolence.  Auto CPAP settings seem to have worked well for him with good control of events.  He seems to have settled down with nasal pillows. His weight has not significantly changed since his sleep study.  We will reassess with home sleep test. Based on this we can provide him with a new CPAP machine with auto settings his current machine is 63 years old and he will qualify for a new machine

## 2020-06-08 NOTE — Patient Instructions (Signed)
Established with new DME and provide CPAP supplies including nasal pillows. Schedule home sleep test. Based on this we will get you a new CPAP machine

## 2020-06-09 ENCOUNTER — Encounter (HOSPITAL_COMMUNITY): Payer: Self-pay | Admitting: General Surgery

## 2020-06-22 ENCOUNTER — Telehealth: Payer: Self-pay | Admitting: Pulmonary Disease

## 2020-06-22 DIAGNOSIS — G4733 Obstructive sleep apnea (adult) (pediatric): Secondary | ICD-10-CM

## 2020-06-22 NOTE — Telephone Encounter (Signed)
Put order in for patient to get a new Cpap [id number] ok per Dr.Alva. Patient is aware of order. Nothing else further needed.

## 2020-06-22 NOTE — Telephone Encounter (Signed)
Plan as per last office note was to perform home sleep test and then try to get him a new CPAP machine Please confirm that home sleep test has been scheduled. In the meantime, okay to proceed with auto CPAP 5 to 15 cm as a new prescription

## 2020-06-22 NOTE — Telephone Encounter (Signed)
Spoke with patient regarding prior message. Patient stated he got CPAP supplies from his DME [facility] and hose doesn't fit his CPAP that he currently  has. Patient stated his CPAP he currently has is making a humming sound. Patient has had this CPAP since 2015 . Dr.Alva do you think patient could possibly need a new CPAP machine ?  Dr.Alva can you please advise.   Thank you

## 2020-06-23 ENCOUNTER — Telehealth: Payer: Self-pay | Admitting: Pulmonary Disease

## 2020-06-23 NOTE — Telephone Encounter (Signed)
LMTCB for the pt 

## 2020-06-27 NOTE — Telephone Encounter (Signed)
Called and spoke to pt. Pt states when he was here for his OV with Dr. Elsworth Soho he brought his sleep study and per the pt a copy was made.   PCC's was this given to you? Thanks. If not, will need to see if Dr. Elsworth Soho has it or the nurse that was working with him that day has it.

## 2020-06-27 NOTE — Telephone Encounter (Signed)
Called [place] office and spoke with Kenney Houseman to see if they still had a copy of sleep study pt brought by. She stated that they had already sent it to scan and do not currently have a copy of it. Stated to her that I would call pt to see if he still had a copy of the sleep study so he could bring it by as DME needs it in order to be able to process the order that was placed at last OV.  Called and spoke with pt to see if he had a copy of the sleep study that he brought to [place] office and he stated that he had shredded the original copy of the study. Pt said that he did have a hard copy on his computer that he would print out and would take it to [place] office. Stated to pt in order for DME to be able to process the order that was placed, they need that sleep study.  Pt is going to take another copy of the sleep study to [place] office so it can then be faxed to [place].  Routing this encounter to [employer].

## 2020-06-27 NOTE — Telephone Encounter (Signed)
Pt brought paper work off and it was faxed to [facility] at [phone]. Confirmation received. Nothing further needed.//tg

## 2020-06-27 NOTE — Telephone Encounter (Signed)
The only thing I see scanned into the chart is a CPAP compliance report.  There was a HST ordered for the patient on 9/2.  Jake Samples (in the [place] office) has called the patient to schedule a time for him to complete the HST.  Tonya's number in [place] is [phone].

## 2020-07-19 ENCOUNTER — Ambulatory Visit: Payer: PRIVATE HEALTH INSURANCE

## 2020-07-19 DIAGNOSIS — G4730 Sleep apnea, unspecified: Secondary | ICD-10-CM

## 2020-07-19 DIAGNOSIS — G4733 Obstructive sleep apnea (adult) (pediatric): Secondary | ICD-10-CM

## 2020-07-21 ENCOUNTER — Telehealth: Payer: Self-pay | Admitting: Pulmonary Disease

## 2020-07-21 DIAGNOSIS — G4733 Obstructive sleep apnea (adult) (pediatric): Secondary | ICD-10-CM

## 2020-07-21 DIAGNOSIS — Z9989 Dependence on other enabling machines and devices: Secondary | ICD-10-CM

## 2020-07-21 NOTE — Telephone Encounter (Signed)
Attempted to call patient at both numbers listed. Unable to leave message due to no voicemail noted to pick up on home phone per DPR can leave message on home phone. Will attempt to try patient again at a later time.

## 2020-07-21 NOTE — Telephone Encounter (Signed)
Patient returned phone call and identity verified. Discussed HST results per Dr Elsworth Soho. Patient expressed understanding. Patient stated that Huey Romans has called him to set him up with new CPAP machine and he was just waiting for new settings to be ordered post HST. Order placed for cpap settings per Dr Elsworth Soho. Follow up office visit scheduled for 08/30/2020 at 9:45am with Dr Elsworth Soho in [place]. Patient agreeable to time, date and location. Nothing further needed at this time.

## 2020-07-21 NOTE — Telephone Encounter (Signed)
HST showed moderate  OSA , worse in the supine position Suggest autoCPAP  5-15 cm, mask of choice OV with me/APP in 6 wks

## 2020-07-21 NOTE — Addendum Note (Signed)
Addended by: Merrilee Seashore on: 07/21/2020 05:07 PM   Modules accepted: Orders

## 2020-08-30 ENCOUNTER — Encounter: Payer: PRIVATE HEALTH INSURANCE | Admitting: Pulmonary Disease

## 2020-08-30 ENCOUNTER — Other Ambulatory Visit: Payer: Self-pay

## 2020-09-27 ENCOUNTER — Telehealth: Payer: Self-pay | Admitting: Pulmonary Disease

## 2020-09-27 NOTE — Telephone Encounter (Signed)
Patient can only wear only 3 hours because of the exhaling noise the machine makes.  He uses nasal pillows and there is a lot of pressure on the machine.  It is noisier a lot nosier than the one he had previously.  Feels like too much pressure. He is trying to wear it at least 4 hours a night, but the pressure and noise wakes him up.   He would like to donate his old CPAP machine and so clean cleaning system.  Dr. Elsworth Soho please advise regarding the pressure and noise with the CPAP machine.  PCCS:  Can the patient donate his old CPAP machine and so clean cleaning system to someone who cannot afford a machine?

## 2020-09-27 NOTE — Telephone Encounter (Signed)
ATC patient, he was not home at the time of my call, but his wife stated he would be home in about an hour.  I requested that he return our call.  She states when he takes a breath, it sounds like darth vadar, he has never had a machine that was loud like that before.  Will await a return call.

## 2020-09-28 NOTE — Telephone Encounter (Signed)
Called and spoke with the pt and notified of response per [place]  He verbalized understanding and nothing further needed

## 2020-09-28 NOTE — Telephone Encounter (Signed)
Ok to donate old cpap take it to the dme he got it from [employer]

## 2020-10-24 ENCOUNTER — Telehealth: Payer: Self-pay | Admitting: Pulmonary Disease

## 2020-10-24 NOTE — Telephone Encounter (Signed)
Called and spoke to pt's wife. She states the pt will call back soon.

## 2020-10-24 NOTE — Telephone Encounter (Signed)
Spoke with pt, states he is not tolerating his cpap machine.  "sounds like darth vader" all night, only getting 3-4 hours of sleep.  Pt has a Luna machine, was switched to nasal pillows through [employer] but it hasn't helped with noise.   Pt called about this on 09/28/20 but this was not addressed.  Called [employer] to see about getting a cpap download.  The reps are working remotely today d/t weather, but rep will place a message to RT to reach out to patient to get a download.  Will leave message open to follow up on.

## 2020-10-31 ENCOUNTER — Other Ambulatory Visit

## 2020-10-31 ENCOUNTER — Encounter: Admitting: Family Medicine

## 2020-10-31 DIAGNOSIS — Z20822 Contact with and (suspected) exposure to covid-19: Secondary | ICD-10-CM

## 2020-11-01 LAB — NOVEL CORONAVIRUS, NAA: SARS-CoV-2, NAA: NOT DETECTED

## 2020-11-01 LAB — SARS-COV-2, NAA 2 DAY TAT

## 2020-11-07 NOTE — Telephone Encounter (Signed)
Pt returning a phone call. Pt can be reached at [phone].

## 2020-11-07 NOTE — Telephone Encounter (Signed)
ATC patient.  LMTCB. 

## 2020-11-07 NOTE — Telephone Encounter (Signed)
Called patient but he did not answer. Left message for him to call back.  

## 2020-11-07 NOTE — Telephone Encounter (Signed)
  Finally obtained download from his lunar device 3.5-hour usage, average Average pressure 9.5 cm which is not too high  I am not seeing any problems at the device end.  HAs he settled down with this?  Does he need a new type of mask

## 2020-11-08 NOTE — Telephone Encounter (Signed)
Called and spoke with pt letting him know the info stated by Dr. Elsworth Soho and he verbalized understanding. Pt stated he took his machine to [facility] in [place] and they told him they would not do anything with his machine without hearing from [place] so I stated to pt that I would call [employer] and he verbalized understanding. Called [employer] and spoke with one of their reps who stated they would have respiratory call pt to get more info from him about what the issue is with the machine. Nothing further needed.

## 2020-11-08 NOTE — Telephone Encounter (Signed)
This is a new type of device - Bryan Shepard It seems to be an auto settings and average pressure is 9.5 cm which is not too high. Please ask DME to see if they can assist him and troubleshoot his problem If DME unable to help, please have him call back and we can send him to sleep lab for a desensitization visit

## 2020-11-08 NOTE — Telephone Encounter (Signed)
Called and spoke with pt. Pt believes his cpap settings are too high due to him not able to sleep at night.  Pt states when he does try to wear his CPAP, there is a lot of noise coming from the machine which he figures could be due to his current settings.  Pt is not enrolled in either [employer] or [employer]. Pt stated that his machine does have an SD card in it but last time he brought the card by our office, we were unable to get a download off of machine.  Asked pt when last time he wore the machine was and he said it has been a while since last time it was worn (could not provide exact timeframe).  Dr. Elsworth Soho, please advise.

## 2020-11-08 NOTE — Telephone Encounter (Signed)
Attempted to call pt but unable to reach. Left message for him to return call. °

## 2020-11-14 ENCOUNTER — Encounter: Payer: Self-pay | Admitting: Family Medicine

## 2020-11-24 ENCOUNTER — Encounter: Payer: Self-pay | Admitting: Family Medicine

## 2020-11-27 ENCOUNTER — Encounter: Payer: Self-pay | Admitting: Family Medicine

## 2020-11-27 ENCOUNTER — Ambulatory Visit (INDEPENDENT_AMBULATORY_CARE_PROVIDER_SITE_OTHER): Admitting: Family Medicine

## 2020-11-27 DIAGNOSIS — R197 Diarrhea, unspecified: Secondary | ICD-10-CM | POA: Diagnosis not present

## 2020-11-27 DIAGNOSIS — U071 COVID-19: Secondary | ICD-10-CM | POA: Diagnosis not present

## 2020-11-27 NOTE — Progress Notes (Signed)
Virtual Visit via telephone Note Due to COVID-19 pandemic this visit was conducted virtually. This visit type was conducted due to national recommendations for restrictions regarding the COVID-19 Pandemic (e.g. social distancing, sheltering in place) in an effort to limit this patient's exposure and mitigate transmission in our community. All issues noted in this document were discussed and addressed.  A physical exam was not performed with this format.  I connected with Bryan Shepard. on 11/27/20 at [phone] by telephone and verified that I am speaking with the correct person using two identifiers. Bryan Shepard. is currently located at home and no one is currently with him during the visit. The provider, Gwenlyn Perking, FNP is located in their office at time of visit.  I discussed the limitations, risks, security and privacy concerns of performing an evaluation and management service by telephone and the availability of in person appointments. I also discussed with the patient that there may be a patient responsible charge related to this service. The patient expressed understanding and agreed to proceed.  CC: Covid 19, diarrhea  History and Present Illness:  HPI  1. Covid 19 Bryan Shepard reports that he tested positive for Covid 19 about 10 days ago. He reports that his symptoms were mild with a scratchy/sore throat, loss of smell, malaise, sneezing and a dry cough. He reports that his symptoms have resolved other than the dry cough.   2. Diarrhea Bryan Shepard reports 3 episodes of diarrhea over the last month. He has one episode of fecal incontinence. He denies abdominal pain, fever, vomiting, or blood in his stool. He reports solid BMs other than these 3 episodes. He has been having BMs every 1-3 days. He does sometimes have to strain. He does report a decrease in his appetite and that he has lost a little bit of weight, although he has been trying to exercise and eat healthier. He had a colonoscopy  in 2018 that was normal, with recommended repeat in 5 years.   ROS As per HPI.   Observations/Objective: Alert and oriented x 3. Able to speak in full sentences without difficulty.   Assessment and Plan: Bryan Shepard was seen today for covid positive and diarrhea.  Diagnoses and all orders for this visit:  COVID-19 Symptoms have resolved other than dry cough. Discussed that dry cough may persist for a few weeks. Recommend OTC cough medications as needed. Return to office for new or worsening symptoms.  Diarrhea, unspecified type 3 episodes over the last month. Discussed miralax for possible constipation. Declined GI referral today. He has an appointment with his PCP next week, keep this appointment so that an in person exam can be completed and possibility a KUB, labs if PCP feels it is appropriate. Return to office for new or worsening symptoms.   Follow Up Instructions: Keep scheduled appointment with PCP next week. Follow up sooner if needed.     I discussed the assessment and treatment plan with the patient. The patient was provided an opportunity to ask questions and all were answered. The patient agreed with the plan and demonstrated an understanding of the instructions.   The patient was advised to call back or seek an in-person evaluation if the symptoms worsen or if the condition fails to improve as anticipated.  The above assessment and management plan was discussed with the patient. The patient verbalized understanding of and has agreed to the management plan. Patient is aware to call the clinic if symptoms persist or worsen. Patient  is aware when to return to the clinic for a follow-up visit. Patient educated on when it is appropriate to go to the emergency department.   Time call ended:  0855  I provided 12 minutes of phone time during this encounter.    Gwenlyn Perking, FNP

## 2020-12-08 ENCOUNTER — Ambulatory Visit (INDEPENDENT_AMBULATORY_CARE_PROVIDER_SITE_OTHER): Admitting: Family Medicine

## 2020-12-08 ENCOUNTER — Encounter: Payer: Self-pay | Admitting: Family Medicine

## 2020-12-08 ENCOUNTER — Other Ambulatory Visit: Payer: Self-pay

## 2020-12-08 VITALS — BP 138/86 | HR 73 | Temp 96.9°F | Ht 67.0 in | Wt 207.6 lb

## 2020-12-08 DIAGNOSIS — Z8673 Personal history of transient ischemic attack (TIA), and cerebral infarction without residual deficits: Secondary | ICD-10-CM

## 2020-12-08 DIAGNOSIS — E782 Mixed hyperlipidemia: Secondary | ICD-10-CM | POA: Diagnosis not present

## 2020-12-08 DIAGNOSIS — Z125 Encounter for screening for malignant neoplasm of prostate: Secondary | ICD-10-CM

## 2020-12-08 DIAGNOSIS — G4733 Obstructive sleep apnea (adult) (pediatric): Secondary | ICD-10-CM

## 2020-12-08 DIAGNOSIS — Z9989 Dependence on other enabling machines and devices: Secondary | ICD-10-CM

## 2020-12-08 DIAGNOSIS — Z0001 Encounter for general adult medical examination with abnormal findings: Secondary | ICD-10-CM | POA: Diagnosis not present

## 2020-12-08 DIAGNOSIS — I10 Essential (primary) hypertension: Secondary | ICD-10-CM | POA: Diagnosis not present

## 2020-12-08 DIAGNOSIS — Z0000 Encounter for general adult medical examination without abnormal findings: Secondary | ICD-10-CM

## 2020-12-08 DIAGNOSIS — E669 Obesity, unspecified: Secondary | ICD-10-CM

## 2020-12-08 MED ORDER — LISINOPRIL 10 MG PO TABS: 10.0000 mg | ORAL_TABLET | Freq: Every day | ORAL | 2 refills | Status: DC

## 2020-12-08 NOTE — Patient Instructions (Signed)

## 2020-12-08 NOTE — Progress Notes (Signed)
Assessment & Plan:  1. Well adult exam Preventive health education provided.  Patient declined hepatitis C screening, COVID-19, and influenza vaccines. - CBC with Differential/Platelet - CMP14+EGFR - Lipid panel - PSA, total and free - TSH  2. Essential hypertension Uncontrolled.  Lisinopril increased from 5 mg to 10 mg once daily.  Continue diet and exercise. - CBC with Differential/Platelet - CMP14+EGFR - Lipid panel - lisinopril (ZESTRIL) 10 MG tablet; Take 1 tablet (10 mg total) by mouth at bedtime.  Dispense: 30 tablet; Refill: 2  3. Mixed hyperlipidemia Labs to assess. - Lipid panel  4. History of CVA (cerebrovascular accident) Declines statin.  5. OSA on CPAP Managed by pulmonology.  Continue wearing CPAP nightly.  6. Obesity (BMI 30.0-34.9) Continue diet and exercise. - CBC with Differential/Platelet - CMP14+EGFR - Lipid panel - TSH  7. Screening for prostate cancer - PSA, total and free   Follow-up: Return in about 3 months (around 03/10/2021) for HTN.   Hendricks Limes, MSN, APRN, FNP-C [facility]  Subjective:  Patient ID: Asani Mcburney., male    DOB: 1956/10/04  Age: 65 y.o. MRN: [id number]  Patient Care Team: Loman Brooklyn, FNP as PCP - General (Family Medicine) Rigoberto Noel, MD as Consulting Physician (Pulmonary Disease)   CC:  Chief Complaint  Patient presents with  . Medical Management of Chronic Issues    HPI Deirdre Priest. presents for his annual physical.   Occupation: Retired Corporate treasurer, Marital status: Married, Substance use: None Diet: Healthy, Exercise: Yes Last colonoscopy: 2018 with recommendation to repeat in 5 years Hepatitis C Screening: Declined PSA: Obtained today Immunizations: Flu Vaccine: declined Tdap Vaccine: up to date  Shingrix Vaccine: up to date  COVID-19 Vaccine: declined  DEPRESSION SCREENING PHQ 2/9 Scores 12/08/2020 04/28/2020 10/22/2019  PHQ - 2 Score 0 0 0     Hypertension:  Patient reports his wife randomly checks his blood pressure at home and he is mildly elevated.  He is eating healthier although he admits he does love country ham and his Cokes.  He is exercising.  OSA: Wears his CPAP nightly.  Managed by pulmonologist, Dr. Elsworth Soho.  Review of Systems  Constitutional: Negative for chills, fever, malaise/fatigue and weight loss.  HENT: Negative for congestion, ear discharge, ear pain, nosebleeds, sinus pain, sore throat and tinnitus.   Eyes: Negative for blurred vision, double vision, pain, discharge and redness.  Respiratory: Negative for cough, shortness of breath and wheezing.   Cardiovascular: Negative for chest pain, palpitations and leg swelling.  Gastrointestinal: Negative for abdominal pain, constipation, diarrhea, heartburn, nausea and vomiting.  Genitourinary: Negative for dysuria, frequency and urgency.       Denies trouble initiating a urine stream, weak stream, split stream, nocturia, and dribbling.   Musculoskeletal: Negative for myalgias.  Skin: Negative for rash.  Neurological: Negative for dizziness, seizures, weakness and headaches.  Psychiatric/Behavioral: Negative for depression, substance abuse and suicidal ideas. The patient is not nervous/anxious.      Current Outpatient Medications:  .  aspirin EC 81 MG tablet, Take 81 mg by mouth daily., Disp: , Rfl:  .  Cholecalciferol (VITAMIN D3) 50 MCG (2000 UT) capsule, Take 1,000 Units by mouth., Disp: , Rfl:  .  famotidine (PEPCID) 10 MG tablet, Take 10 mg by mouth 2 (two) times daily., Disp: , Rfl:  .  loratadine (CLARITIN) 10 MG tablet, Take 1 tablet by mouth daily., Disp: , Rfl:  .  Melatonin 3 MG CAPS, Take  by mouth., Disp: , Rfl:  .  omega-3 acid ethyl esters (LOVAZA) 1 g capsule, Take 2 g by mouth daily., Disp: , Rfl:  .  UNABLE TO FIND, Tumeric capsule, 1 capsule once daily, Disp: , Rfl:  .  valACYclovir (VALTREX) 500 MG tablet, Take 500 mg by mouth 2 (two) times daily., Disp: , Rfl:  .   lisinopril (ZESTRIL) 10 MG tablet, Take 1 tablet (10 mg total) by mouth at bedtime., Disp: 30 tablet, Rfl: 2  No Known Allergies  Past Medical History:  Diagnosis Date  . Acid reflux   . Colon polyps   . Family history of genetic mutation for hereditary nonpolyposis colorectal cancer (HNPCC)   . Family history of prostate cancer   . Family history of stomach cancer   . HOH (hard of hearing)   . Hypertension   . Sleep apnea    CPAP  . Stroke ([place]) 2015  . TIA (transient ischemic attack) 2016    Past Surgical History:  Procedure Laterality Date  . CHOLECYSTECTOMY N/A 04/17/2020   Procedure: LAPAROSCOPIC CHOLECYSTECTOMY;  Surgeon: [name], MD;  Location: [facility];  Service: General;  Laterality: N/A;  . COLONOSCOPY  2018    Family History  Problem Relation Age of Onset  . Melanoma Mother 46  . Hypertension Mother   . Transient ischemic attack Mother   . Stroke Mother   . Heart disease Mother   . Prostate cancer Father 51  . Stomach cancer Father 37       PMS2+; Lynch syndrome  . Melanoma Father   . Diabetes Father   . Heart disease Father   . Stroke Maternal Grandmother   . Lung cancer Paternal Grandmother   . Throat cancer Paternal Grandmother   . Prostate cancer Paternal Grandfather 38  . Bladder Cancer Other        MGM's mother with urethra cancer    Social History   Socioeconomic History  . Marital status: Married    Spouse name: [name]  . Number of children: Not on file  . Years of education: Not on file  . Highest education level: Not on file  Occupational History  . Occupation: retired     Comment: [employer]   Tobacco Use  . Smoking status: Never Smoker  . Smokeless tobacco: Never Used  Vaping Use  . Vaping Use: Never used  Substance and Sexual Activity  . Alcohol use: No  . Drug use: No  . Sexual activity: Yes    Birth control/protection: None  Other Topics Concern  . Not on file  Social History Narrative   Married , 1 step daughter     Social Determinants of Health   Financial Resource Strain: Not on file  Food Insecurity: Not on file  Transportation Needs: Not on file  Physical Activity: Not on file  Stress: Not on file  Social Connections: Not on file  Intimate Partner Violence: Not on file      Objective:    BP 138/86   Pulse 73   Temp (!) 96.9 F (36.1 C) (Temporal)   Ht '5\' 7"'  (1.702 m)   Wt 207 lb 9.6 oz (94.2 kg)   SpO2 97%   BMI 32.51 kg/m   Wt Readings from Last 3 Encounters:  12/08/20 207 lb 9.6 oz (94.2 kg)  06/08/20 210 lb 3.2 oz (95.3 kg)  04/28/20 (!) 207 lb 3.2 oz (94 kg)    Physical Exam Vitals reviewed.  Constitutional:  General: He is not in acute distress.    Appearance: Normal appearance. He is obese. He is not ill-appearing, toxic-appearing or diaphoretic.  HENT:     Head: Normocephalic and atraumatic.     Right Ear: Tympanic membrane, ear canal and external ear normal. There is no impacted cerumen.     Left Ear: Tympanic membrane, ear canal and external ear normal. There is no impacted cerumen.     Nose: Nose normal. No congestion or rhinorrhea.     Mouth/Throat:     Mouth: Mucous membranes are moist.     Pharynx: Oropharynx is clear. No oropharyngeal exudate or posterior oropharyngeal erythema.  Eyes:     General: No scleral icterus.       Right eye: No discharge.        Left eye: No discharge.     Conjunctiva/sclera: Conjunctivae normal.     Pupils: Pupils are equal, round, and reactive to light.  Neck:     Vascular: No carotid bruit.  Cardiovascular:     Rate and Rhythm: Normal rate and regular rhythm.     Heart sounds: Normal heart sounds. No murmur heard. No friction rub. No gallop.   Pulmonary:     Effort: Pulmonary effort is normal. No respiratory distress.     Breath sounds: Normal breath sounds. No stridor. No wheezing, rhonchi or rales.  Abdominal:     General: Abdomen is flat. Bowel sounds are normal. There is no distension.     Palpations: Abdomen  is soft. There is no mass.     Tenderness: There is no abdominal tenderness. There is no guarding or rebound.     Hernia: No hernia is present.  Musculoskeletal:        General: Normal range of motion.     Cervical back: Normal range of motion and neck supple. No rigidity. No muscular tenderness.     Right lower leg: No edema.     Left lower leg: No edema.  Lymphadenopathy:     Cervical: No cervical adenopathy.  Skin:    General: Skin is warm and dry.     Capillary Refill: Capillary refill takes less than 2 seconds.  Neurological:     General: No focal deficit present.     Mental Status: He is alert and oriented to person, place, and time. Mental status is at baseline.  Psychiatric:        Mood and Affect: Mood normal.        Behavior: Behavior normal.        Thought Content: Thought content normal.        Judgment: Judgment normal.     No results found for: TSH Lab Results  Component Value Date   WBC 13.1 (H) 04/18/2020   HGB 13.6 04/18/2020   HCT 41.2 04/18/2020   MCV 86.2 04/18/2020   PLT 183 04/18/2020   Lab Results  Component Value Date   NA 137 04/18/2020   K 3.8 04/18/2020   CO2 24 04/18/2020   GLUCOSE 150 (H) 04/18/2020   BUN 17 04/18/2020   CREATININE 1.34 (H) 04/18/2020   BILITOT 0.7 04/18/2020   ALKPHOS 46 04/18/2020   AST 34 04/18/2020   ALT 42 04/18/2020   PROT 5.6 (L) 04/18/2020   ALBUMIN 3.2 (L) 04/18/2020   CALCIUM 8.3 (L) 04/18/2020   ANIONGAP 8 04/18/2020   No results found for: CHOL No results found for: HDL No results found for: LDLCALC No results found for: TRIG No results  found for: CHOLHDL No results found for: HGBA1C

## 2020-12-09 LAB — CMP14+EGFR
ALT: 64 IU/L — ABNORMAL HIGH (ref 0–44)
AST: 36 IU/L (ref 0–40)
Albumin/Globulin Ratio: 1.9 (ref 1.2–2.2)
Albumin: 4.4 g/dL (ref 3.8–4.8)
Alkaline Phosphatase: 70 IU/L (ref 44–121)
BUN/Creatinine Ratio: 11 (ref 10–24)
BUN: 13 mg/dL (ref 8–27)
Bilirubin Total: 0.5 mg/dL (ref 0.0–1.2)
CO2: 22 mmol/L (ref 20–29)
Calcium: 9.5 mg/dL (ref 8.6–10.2)
Chloride: 101 mmol/L (ref 96–106)
Creatinine, Ser: 1.19 mg/dL (ref 0.76–1.27)
Globulin, Total: 2.3 g/dL (ref 1.5–4.5)
Glucose: 131 mg/dL — ABNORMAL HIGH (ref 65–99)
Potassium: 4.6 mmol/L (ref 3.5–5.2)
Sodium: 138 mmol/L (ref 134–144)
Total Protein: 6.7 g/dL (ref 6.0–8.5)
eGFR: 69 mL/min/{1.73_m2} (ref >59)

## 2020-12-09 LAB — LIPID PANEL
Chol/HDL Ratio: 6.8 ratio — ABNORMAL HIGH (ref 0.0–5.0)
Cholesterol, Total: 184 mg/dL (ref 100–199)
HDL: 27 mg/dL — ABNORMAL LOW (ref >39)
LDL Chol Calc (NIH): 123 mg/dL — ABNORMAL HIGH (ref 0–99)
Triglycerides: 192 mg/dL — ABNORMAL HIGH (ref 0–149)
VLDL Cholesterol Cal: 34 mg/dL (ref 5–40)

## 2020-12-09 LAB — CBC WITH DIFFERENTIAL/PLATELET
Basophils Absolute: 0 10*3/uL (ref 0.0–0.2)
Basos: 1 %
EOS (ABSOLUTE): 0.2 10*3/uL (ref 0.0–0.4)
Eos: 3 %
Hematocrit: 47.8 % (ref 37.5–51.0)
Hemoglobin: 15.9 g/dL (ref 13.0–17.7)
Immature Grans (Abs): 0 10*3/uL (ref 0.0–0.1)
Immature Granulocytes: 0 %
Lymphocytes Absolute: 1.6 10*3/uL (ref 0.7–3.1)
Lymphs: 28 %
MCH: 28.5 pg (ref 26.6–33.0)
MCHC: 33.3 g/dL (ref 31.5–35.7)
MCV: 86 fL (ref 79–97)
Monocytes Absolute: 0.5 10*3/uL (ref 0.1–0.9)
Monocytes: 10 %
Neutrophils Absolute: 3.2 10*3/uL (ref 1.4–7.0)
Neutrophils: 58 %
Platelets: 211 10*3/uL (ref 150–450)
RBC: 5.57 x10E6/uL (ref 4.14–5.80)
RDW: 12.8 % (ref 11.6–15.4)
WBC: 5.5 10*3/uL (ref 3.4–10.8)

## 2020-12-09 LAB — PSA, TOTAL AND FREE
PSA, Free Pct: 42.5 %
PSA, Free: 0.17 ng/mL
Prostate Specific Ag, Serum: 0.4 ng/mL (ref 0.0–4.0)

## 2020-12-09 LAB — TSH: TSH: 1.44 u[IU]/mL (ref 0.450–4.500)

## 2020-12-12 LAB — HGB A1C W/O EAG: Hgb A1c MFr Bld: 7.5 % — ABNORMAL HIGH (ref 4.8–5.6)

## 2020-12-12 LAB — SPECIMEN STATUS REPORT

## 2020-12-13 ENCOUNTER — Encounter: Payer: Self-pay | Admitting: Family Medicine

## 2020-12-13 DIAGNOSIS — R7303 Prediabetes: Secondary | ICD-10-CM | POA: Insufficient documentation

## 2020-12-13 DIAGNOSIS — E119 Type 2 diabetes mellitus without complications: Secondary | ICD-10-CM | POA: Insufficient documentation

## 2020-12-13 DIAGNOSIS — E1165 Type 2 diabetes mellitus with hyperglycemia: Secondary | ICD-10-CM | POA: Insufficient documentation

## 2020-12-13 HISTORY — DX: Type 2 diabetes mellitus without complications: E11.9

## 2020-12-18 ENCOUNTER — Telehealth: Payer: Self-pay

## 2020-12-18 NOTE — Telephone Encounter (Signed)
Letter completed, signed and mailed to [place] per Dr Elsworth Soho for patient to be able to return Luna II machine to [place]. Called and updated patient and sent copy of letter to patient. Nothing further needed at this time

## 2020-12-19 ENCOUNTER — Ambulatory Visit (INDEPENDENT_AMBULATORY_CARE_PROVIDER_SITE_OTHER): Admitting: Family Medicine

## 2020-12-19 ENCOUNTER — Other Ambulatory Visit: Payer: Self-pay

## 2020-12-19 ENCOUNTER — Encounter: Payer: Self-pay | Admitting: Family Medicine

## 2020-12-19 VITALS — BP 149/87 | HR 90 | Temp 97.5°F | Ht 67.0 in | Wt 211.0 lb

## 2020-12-19 DIAGNOSIS — E1165 Type 2 diabetes mellitus with hyperglycemia: Secondary | ICD-10-CM

## 2020-12-19 MED ORDER — BLOOD GLUCOSE MONITOR KIT: PACK | 0 refills | Status: DC

## 2020-12-19 NOTE — Patient Instructions (Addendum)
Ozempic or Metformin?  Schedule a diabetic eye exam.   Diabetes Mellitus and Nutrition, Adult When you have diabetes, or diabetes mellitus, it is very important to have healthy eating habits because your blood sugar (glucose) levels are greatly affected by what you eat and drink. Eating healthy foods in the right amounts, at about the same times every day, can help you:  Control your blood glucose.  Lower your risk of heart disease.  Improve your blood pressure.  Reach or maintain a healthy weight. What can affect my meal plan? Every person with diabetes is different, and each person has different needs for a meal plan. Your health care provider may recommend that you work with a dietitian to make a meal plan that is best for you. Your meal plan may vary depending on factors such as:  The calories you need.  The medicines you take.  Your weight.  Your blood glucose, blood pressure, and cholesterol levels.  Your activity level.  Other health conditions you have, such as heart or kidney disease. How do carbohydrates affect me? Carbohydrates, also called carbs, affect your blood glucose level more than any other type of food. Eating carbs naturally raises the amount of glucose in your blood. Carb counting is a method for keeping track of how many carbs you eat. Counting carbs is important to keep your blood glucose at a healthy level, especially if you use insulin or take certain oral diabetes medicines. It is important to know how many carbs you can safely have in each meal. This is different for every person. Your dietitian can help you calculate how many carbs you should have at each meal and for each snack. How does alcohol affect me? Alcohol can cause a sudden decrease in blood glucose (hypoglycemia), especially if you use insulin or take certain oral diabetes medicines. Hypoglycemia can be a life-threatening condition. Symptoms of hypoglycemia, such as sleepiness, dizziness, and  confusion, are similar to symptoms of having too much alcohol.  Do not drink alcohol if: ? Your health care provider tells you not to drink. ? You are pregnant, may be pregnant, or are planning to become pregnant.  If you drink alcohol: ? Do not drink on an empty stomach. ? Limit how much you use to:  0-1 drink a day for women.  0-2 drinks a day for men. ? Be aware of how much alcohol is in your drink. In the [place] one drink equals one 12 oz bottle of beer (355 mL), one 5 oz glass of wine (148 mL), or one 1 oz glass of hard liquor (44 mL). ? Keep yourself hydrated with water, diet soda, or unsweetened iced tea.  Keep in mind that regular soda, juice, and other mixers may contain a lot of sugar and must be counted as carbs. What are tips for following this plan? Reading food labels  Start by checking the serving size on the "Nutrition Facts" label of packaged foods and drinks. The amount of calories, carbs, fats, and other nutrients listed on the label is based on one serving of the item. Many items contain more than one serving per package.  Check the total grams (g) of carbs in one serving. You can calculate the number of servings of carbs in one serving by dividing the total carbs by 15. For example, if a food has 30 g of total carbs per serving, it would be equal to 2 servings of carbs.  Check the number of grams (g) of saturated  fats and trans fats in one serving. Choose foods that have a low amount or none of these fats.  Check the number of milligrams (mg) of salt (sodium) in one serving. Most people should limit total sodium intake to less than 2,300 mg per day.  Always check the nutrition information of foods labeled as "low-fat" or "nonfat." These foods may be higher in added sugar or refined carbs and should be avoided.  Talk to your dietitian to identify your daily goals for nutrients listed on the label. Shopping  Avoid buying canned, pre-made, or processed foods. These  foods tend to be high in fat, sodium, and added sugar.  Shop around the outside edge of the grocery store. This is where you will most often find fresh fruits and vegetables, bulk grains, fresh meats, and fresh dairy. Cooking  Use low-heat cooking methods, such as baking, instead of high-heat cooking methods like deep frying.  Cook using healthy oils, such as olive, canola, or sunflower oil.  Avoid cooking with butter, cream, or high-fat meats. Meal planning  Eat meals and snacks regularly, preferably at the same times every day. Avoid going long periods of time without eating.  Eat foods that are high in fiber, such as fresh fruits, vegetables, beans, and whole grains. Talk with your dietitian about how many servings of carbs you can eat at each meal.  Eat 4-6 oz (112-168 g) of lean protein each day, such as lean meat, chicken, fish, eggs, or tofu. One ounce (oz) of lean protein is equal to: ? 1 oz (28 g) of meat, chicken, or fish. ? 1 egg. ?  cup (62 g) of tofu.  Eat some foods each day that contain healthy fats, such as avocado, nuts, seeds, and fish.   What foods should I eat? Fruits Berries. Apples. Oranges. Peaches. Apricots. Plums. Grapes. Mango. Papaya. Pomegranate. Kiwi. Cherries. Vegetables Lettuce. Spinach. Leafy greens, including kale, chard, collard greens, and mustard greens. Beets. Cauliflower. Cabbage. Broccoli. Carrots. Green beans. Tomatoes. Peppers. Onions. Cucumbers. Brussels sprouts. Grains Whole grains, such as whole-wheat or whole-grain bread, crackers, tortillas, cereal, and pasta. Unsweetened oatmeal. Quinoa. Brown or wild rice. Meats and other proteins Seafood. Poultry without skin. Lean cuts of poultry and beef. Tofu. Nuts. Seeds. Dairy Low-fat or fat-free dairy products such as milk, yogurt, and cheese. The items listed above may not be a complete list of foods and beverages you can eat. Contact a dietitian for more information. What foods should I  avoid? Fruits Fruits canned with syrup. Vegetables Canned vegetables. Frozen vegetables with butter or cream sauce. Grains Refined white flour and flour products such as bread, pasta, snack foods, and cereals. Avoid all processed foods. Meats and other proteins Fatty cuts of meat. Poultry with skin. Breaded or fried meats. Processed meat. Avoid saturated fats. Dairy Full-fat yogurt, cheese, or milk. Beverages Sweetened drinks, such as soda or iced tea. The items listed above may not be a complete list of foods and beverages you should avoid. Contact a dietitian for more information. Questions to ask a health care provider  Do I need to meet with a diabetes educator?  Do I need to meet with a dietitian?  What number can I call if I have questions?  When are the best times to check my blood glucose? Where to find more information:  American Diabetes Association: diabetes.org  Academy of Nutrition and Dietetics: www.eatright.[employer] of Diabetes and Digestive and Kidney Diseases: [employer]  Association of Diabetes Care and Education  Specialists: www.diabeteseducator.org Summary  It is important to have healthy eating habits because your blood sugar (glucose) levels are greatly affected by what you eat and drink.  A healthy meal plan will help you control your blood glucose and maintain a healthy lifestyle.  Your health care provider may recommend that you work with a dietitian to make a meal plan that is best for you.  Keep in mind that carbohydrates (carbs) and alcohol have immediate effects on your blood glucose levels. It is important to count carbs and to use alcohol carefully. This information is not intended to replace advice given to you by your health care provider. Make sure you discuss any questions you have with your health care provider. Document Revised: 08/31/2019 Document Reviewed: 08/31/2019 Elsevier Patient Education  2021 [employer].

## 2020-12-19 NOTE — Progress Notes (Signed)
Assessment & Plan:  1. Type 2 diabetes mellitus with hyperglycemia, without long-term current use of insulin (HCC) Lab Results  Component Value Date   HGBA1C 7.5 (H) 12/08/2020    - Diabetes is not at goal of A1c < 7. - Medications: not currently on medication. Patient would like to try lifestyle changes before starting a medication to treat. We did discuss Metformin vs Ozempic and he will discuss with his wife.  - Home glucose monitoring: glucometer rx'd today - Patient is not currently taking a statin. Patient is taking an ACE-inhibitor/ARB.  - Instruction/counseling given: reminded to get eye exam, discussed foot care, discussed diet and provided printed educational material  Diabetes Health Maintenance Due  Topic Date Due  . OPHTHALMOLOGY EXAM  Never done  . HEMOGLOBIN A1C  06/10/2021  . FOOT EXAM  12/19/2021    - blood glucose meter kit and supplies KIT; Dispense based on patient and insurance preference. Use up to four times daily as directed.  Dispense: 1 each; Refill: 0   Return in about 3 months (around 03/21/2021) for DM.  Hendricks Limes, MSN, APRN, FNP-C [facility]  Subjective:    Patient ID: Bryan Shepard., male    DOB: 05-Jan-1956, 65 y.o.   MRN: [id number]  Patient Care Team: Loman Brooklyn, FNP as PCP - General (Family Medicine) Rigoberto Noel, MD as Consulting Physician (Pulmonary Disease) Harlen Labs, MD as Referring Physician (Optometry)   Chief Complaint:  Chief Complaint  Patient presents with  . Diabetes    New dx     HPI: Zavier Canela. is a 65 y.o. male presenting on 12/19/2020 for Diabetes (New dx )  Diabetes: Patient presents for follow up of diabetes. Current symptoms include: hyperglycemia. Known diabetic complications: none. Medication compliance: not currently on medication as this is a new diagnosis. Current diet: patient recently started eating healthy. Current exercise: recently started. Is he  on ACE  inhibitor or angiotensin II receptor blocker? Yes. Is he on a statin? No.   New complaints: None  Social history:  Relevant past medical, surgical, family and social history reviewed and updated as indicated. Interim medical history since our last visit reviewed.  Allergies and medications reviewed and updated.  DATA REVIEWED: CHART IN EPIC  ROS: Negative unless specifically indicated above in HPI.    Current Outpatient Medications:  .  aspirin EC 81 MG tablet, Take 81 mg by mouth daily., Disp: , Rfl:  .  blood glucose meter kit and supplies KIT, Dispense based on patient and insurance preference. Use up to four times daily as directed., Disp: 1 each, Rfl: 0 .  Cholecalciferol (VITAMIN D3) 50 MCG (2000 UT) capsule, Take 1,000 Units by mouth., Disp: , Rfl:  .  famotidine (PEPCID) 10 MG tablet, Take 10 mg by mouth 2 (two) times daily., Disp: , Rfl:  .  lisinopril (ZESTRIL) 10 MG tablet, Take 1 tablet (10 mg total) by mouth at bedtime., Disp: 30 tablet, Rfl: 2 .  loratadine (CLARITIN) 10 MG tablet, Take 1 tablet by mouth daily., Disp: , Rfl:  .  Melatonin 3 MG CAPS, Take by mouth., Disp: , Rfl:  .  omega-3 acid ethyl esters (LOVAZA) 1 g capsule, Take 2 g by mouth daily., Disp: , Rfl:  .  UNABLE TO FIND, Tumeric capsule, 1 capsule once daily, Disp: , Rfl:  .  valACYclovir (VALTREX) 500 MG tablet, Take 500 mg by mouth 2 (two) times daily., Disp: ,  Rfl:    No Known Allergies Past Medical History:  Diagnosis Date  . Acid reflux   . Colon polyps   . Diabetes mellitus type 2 in nonobese ([place]) 12/13/2020  . Family history of genetic mutation for hereditary nonpolyposis colorectal cancer (HNPCC)   . Family history of prostate cancer   . Family history of stomach cancer   . HOH (hard of hearing)   . Hypertension   . Sleep apnea    CPAP  . Stroke ([place]) 2015  . TIA (transient ischemic attack) 2016    Past Surgical History:  Procedure Laterality Date  . CHOLECYSTECTOMY N/A 04/17/2020    Procedure: LAPAROSCOPIC CHOLECYSTECTOMY;  Surgeon: [name], MD;  Location: [facility];  Service: General;  Laterality: N/A;  . COLONOSCOPY  2018    Social History   Socioeconomic History  . Marital status: Married    Spouse name: [name]  . Number of children: Not on file  . Years of education: Not on file  . Highest education level: Not on file  Occupational History  . Occupation: retired     Comment: [employer]   Tobacco Use  . Smoking status: Never Smoker  . Smokeless tobacco: Never Used  Vaping Use  . Vaping Use: Never used  Substance and Sexual Activity  . Alcohol use: No  . Drug use: No  . Sexual activity: Yes    Birth control/protection: None  Other Topics Concern  . Not on file  Social History Narrative   Married , 1 step daughter    Social Determinants of Health   Financial Resource Strain: Not on file  Food Insecurity: Not on file  Transportation Needs: Not on file  Physical Activity: Not on file  Stress: Not on file  Social Connections: Not on file  Intimate Partner Violence: Not on file        Objective:    BP (!) 149/87   Pulse 90   Temp (!) 97.5 F (36.4 C) (Temporal)   Ht '5\' 7"'  (1.702 m)   Wt 211 lb (95.7 kg)   SpO2 95%   BMI 33.05 kg/m   Wt Readings from Last 3 Encounters:  12/19/20 211 lb (95.7 kg)  12/08/20 207 lb 9.6 oz (94.2 kg)  06/08/20 210 lb 3.2 oz (95.3 kg)    Physical Exam Vitals reviewed.  Constitutional:      General: He is not in acute distress.    Appearance: Normal appearance. He is obese. He is not ill-appearing, toxic-appearing or diaphoretic.  HENT:     Head: Normocephalic and atraumatic.  Eyes:     General: No scleral icterus.       Right eye: No discharge.        Left eye: No discharge.     Conjunctiva/sclera: Conjunctivae normal.  Cardiovascular:     Rate and Rhythm: Normal rate.  Pulmonary:     Effort: Pulmonary effort is normal. No respiratory distress.  Musculoskeletal:        General: Normal range of  motion.     Cervical back: Normal range of motion.  Skin:    General: Skin is warm and dry.  Neurological:     Mental Status: He is alert and oriented to person, place, and time. Mental status is at baseline.  Psychiatric:        Mood and Affect: Mood normal.        Behavior: Behavior normal.        Thought Content: Thought content normal.  Judgment: Judgment normal.    Diabetic Foot Exam - Simple   Simple Foot Form Diabetic Foot exam was performed with the following findings: Yes 12/19/2020  5:00 PM  Visual Inspection No deformities, no ulcerations, no other skin breakdown bilaterally: Yes Sensation Testing Intact to touch and monofilament testing bilaterally: Yes Pulse Check Posterior Tibialis and Dorsalis pulse intact bilaterally: Yes Comments     Lab Results  Component Value Date   TSH 1.440 12/08/2020   Lab Results  Component Value Date   WBC 5.5 12/08/2020   HGB 15.9 12/08/2020   HCT 47.8 12/08/2020   MCV 86 12/08/2020   PLT 211 12/08/2020   Lab Results  Component Value Date   NA 138 12/08/2020   K 4.6 12/08/2020   CO2 22 12/08/2020   GLUCOSE 131 (H) 12/08/2020   BUN 13 12/08/2020   CREATININE 1.19 12/08/2020   BILITOT 0.5 12/08/2020   ALKPHOS 70 12/08/2020   AST 36 12/08/2020   ALT 64 (H) 12/08/2020   PROT 6.7 12/08/2020   ALBUMIN 4.4 12/08/2020   CALCIUM 9.5 12/08/2020   ANIONGAP 8 04/18/2020   Lab Results  Component Value Date   CHOL 184 12/08/2020   Lab Results  Component Value Date   HDL 27 (L) 12/08/2020   Lab Results  Component Value Date   LDLCALC 123 (H) 12/08/2020   Lab Results  Component Value Date   TRIG 192 (H) 12/08/2020   Lab Results  Component Value Date   CHOLHDL 6.8 (H) 12/08/2020   Lab Results  Component Value Date   HGBA1C 7.5 (H) 12/08/2020

## 2020-12-28 ENCOUNTER — Encounter: Payer: Self-pay | Admitting: Family Medicine

## 2021-01-05 ENCOUNTER — Other Ambulatory Visit: Payer: Self-pay | Admitting: Family Medicine

## 2021-01-05 DIAGNOSIS — I10 Essential (primary) hypertension: Secondary | ICD-10-CM

## 2021-01-05 MED ORDER — LISINOPRIL 10 MG PO TABS: 10.0000 mg | ORAL_TABLET | Freq: Every day | ORAL | 2 refills | Status: DC

## 2021-01-09 LAB — HM DIABETES EYE EXAM

## 2021-02-28 IMAGING — US US ABDOMEN LIMITED
1 series · 14 of 25 positions shown · non-contrast
Comparison: CT abdomen performed earlier today.

CLINICAL DATA: Abdominal pain for 1 day.

EXAM:
ULTRASOUND ABDOMEN LIMITED RIGHT UPPER QUADRANT

[Series 1: us abdomen limited ruq · 14 of 72 slices shown]
[im 1/72]
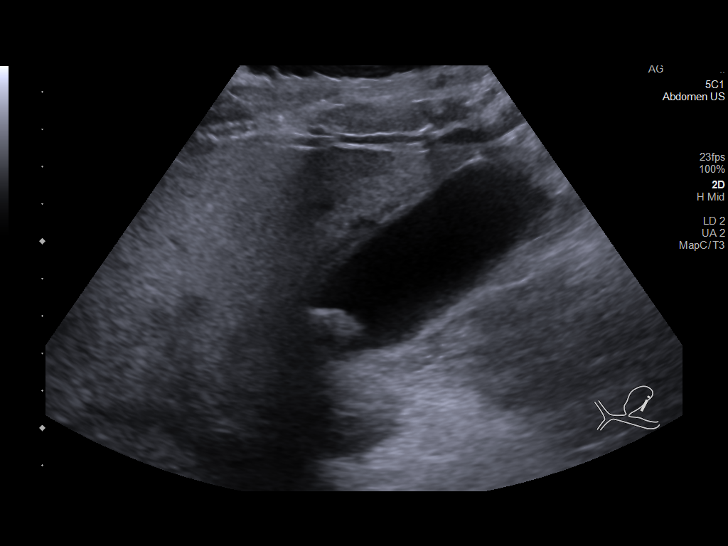
[im 6/72]
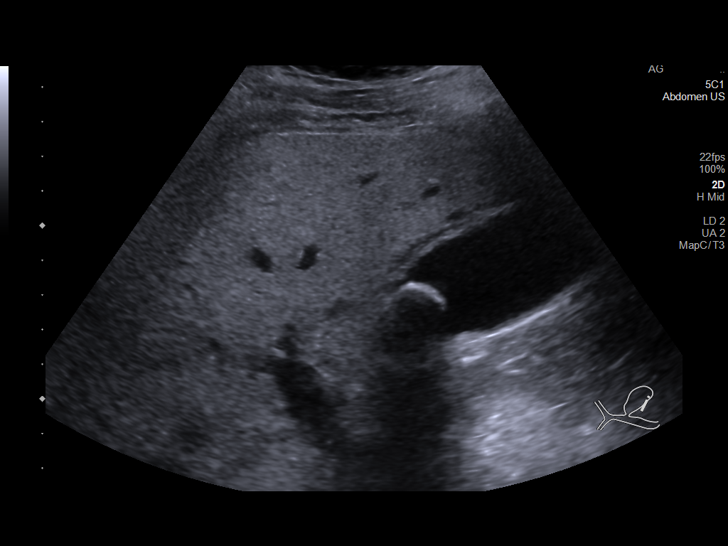
[im 12/72]
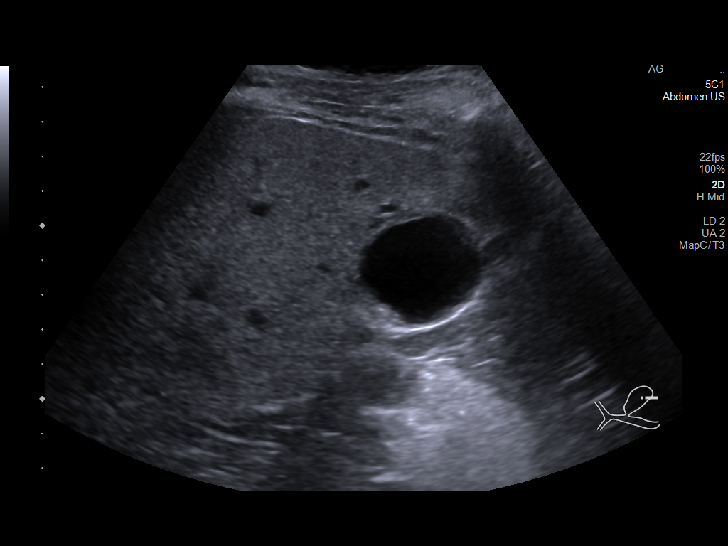
[im 18/72]
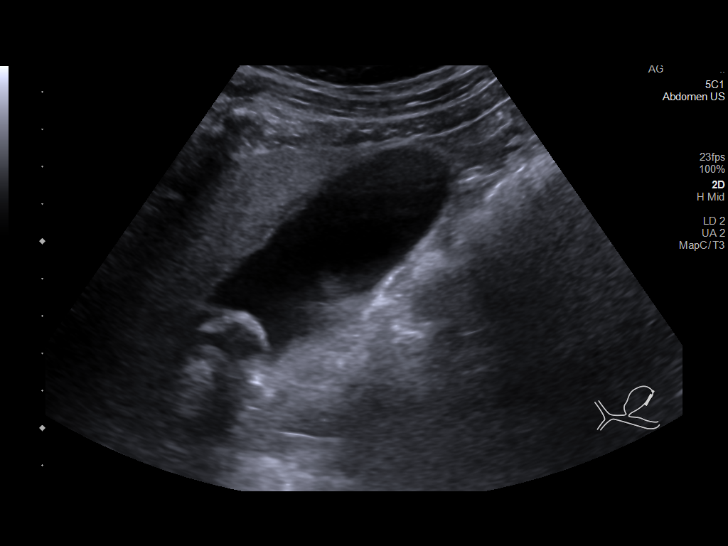
[im 24/72]
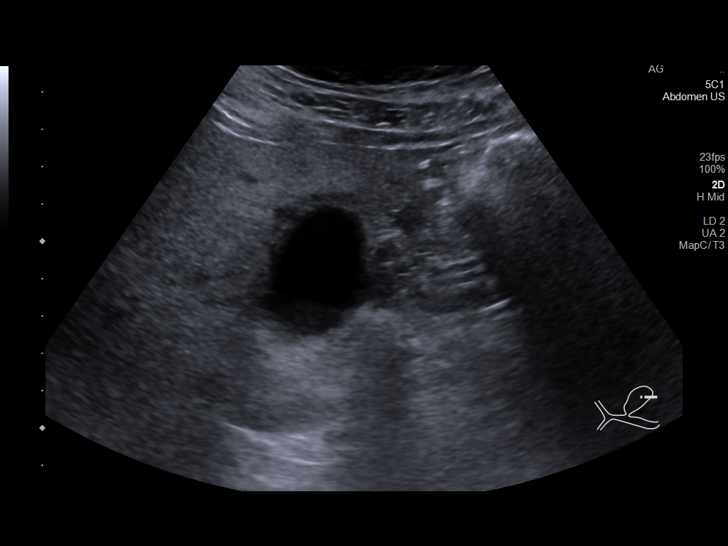
[im 27/72]
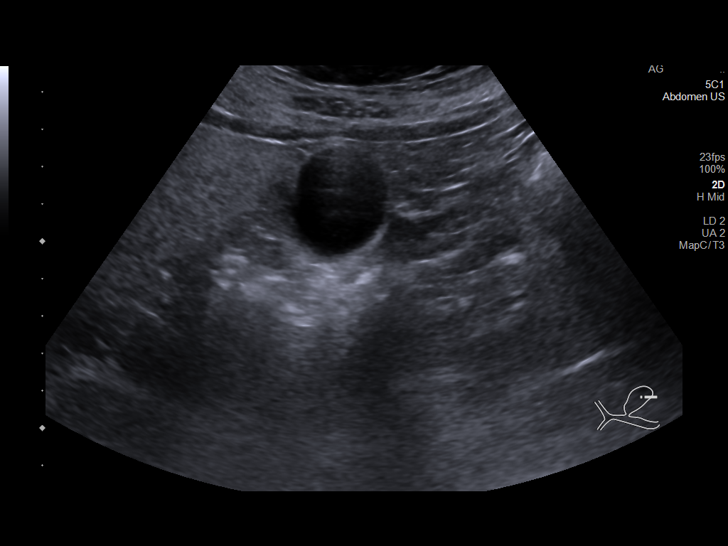
[im 33/72]
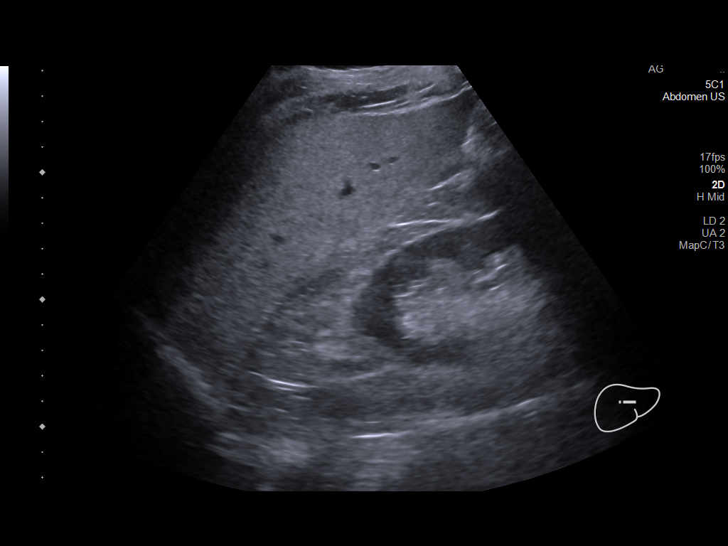
[im 39/72]
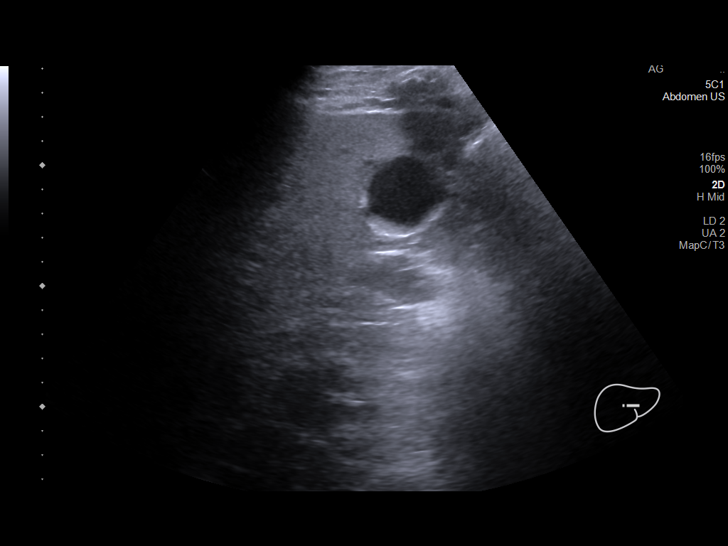
[im 45/72]
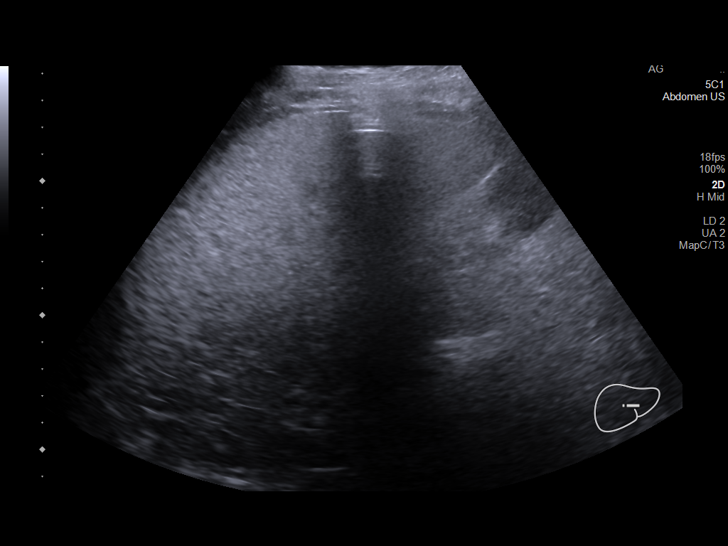
[im 48/72]
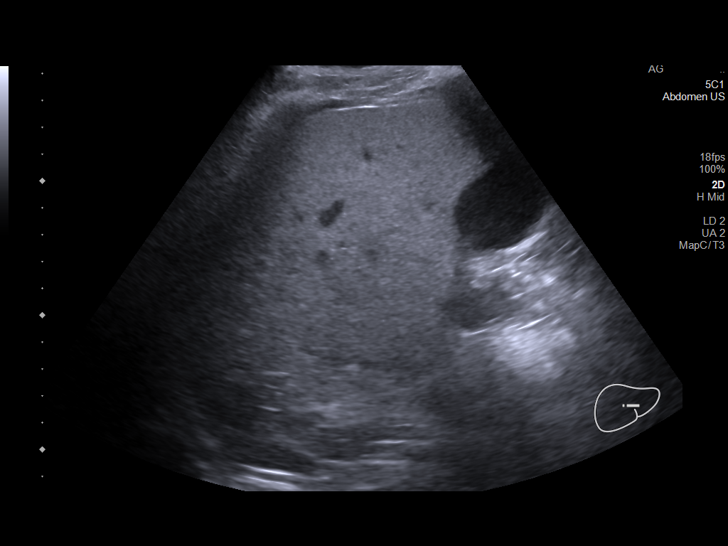
[im 54/72]
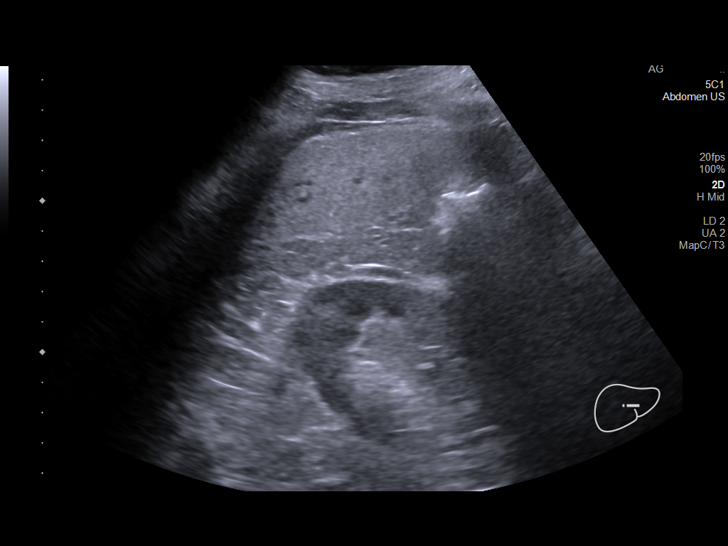
[im 60/72]
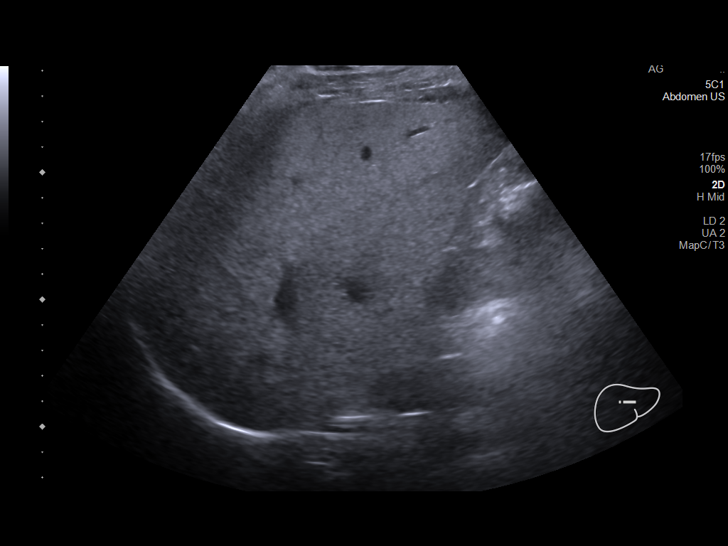
[im 66/72]
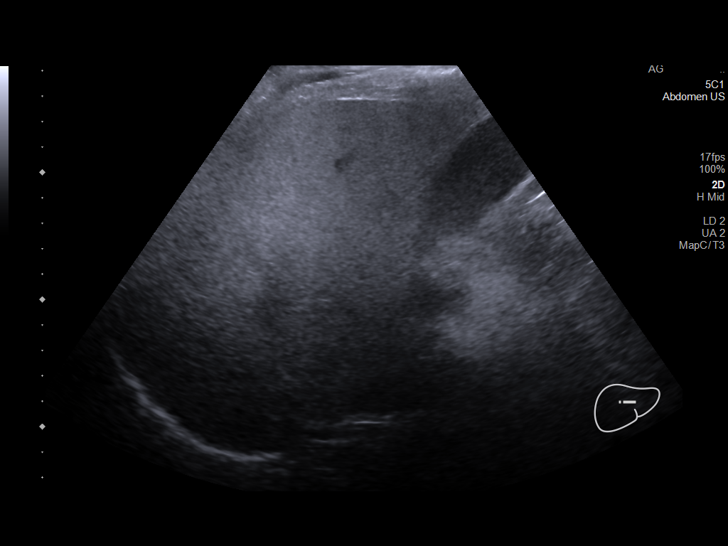
[im 72/72]
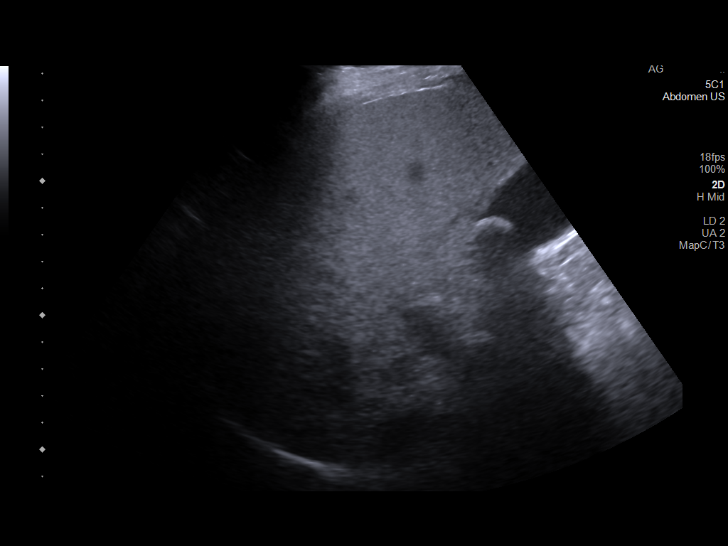

[14 of 25 positions shown; findings below may reference images not displayed]

FINDINGS: Gallbladder:

1.8 cm gallstone. Gallbladder wall appears thickened/edematous and
there is small amount of pericholecystic fluid, suggesting acute
cholecystitis.

Common bile duct:

Diameter: 3 mm

Liver:

Liver is echogenic indicating fatty infiltration. No focal liver
mass or lesion is demonstrated. Portal vein is patent on color
Doppler imaging with normal direction of blood flow towards the
liver.

Other: None.
IMPRESSION: 1. Findings are suggestive of acute cholecystitis. 1.8 cm gallstone.
2. No bile duct dilatation.
3. Fatty infiltration of the liver.

## 2021-03-27 ENCOUNTER — Other Ambulatory Visit: Payer: Self-pay

## 2021-03-27 ENCOUNTER — Ambulatory Visit (INDEPENDENT_AMBULATORY_CARE_PROVIDER_SITE_OTHER): Admitting: Family Medicine

## 2021-03-27 ENCOUNTER — Encounter: Payer: Self-pay | Admitting: Family Medicine

## 2021-03-27 VITALS — BP 138/82 | HR 71 | Temp 97.3°F | Ht 67.0 in | Wt 201.2 lb

## 2021-03-27 DIAGNOSIS — R7303 Prediabetes: Secondary | ICD-10-CM | POA: Diagnosis not present

## 2021-03-27 DIAGNOSIS — E782 Mixed hyperlipidemia: Secondary | ICD-10-CM

## 2021-03-27 DIAGNOSIS — E1165 Type 2 diabetes mellitus with hyperglycemia: Secondary | ICD-10-CM | POA: Diagnosis not present

## 2021-03-27 DIAGNOSIS — E669 Obesity, unspecified: Secondary | ICD-10-CM

## 2021-03-27 DIAGNOSIS — I10 Essential (primary) hypertension: Secondary | ICD-10-CM

## 2021-03-27 LAB — BAYER DCA HB A1C WAIVED: HB A1C (BAYER DCA - WAIVED): 6.3 % (ref <7.0)

## 2021-03-27 NOTE — Patient Instructions (Signed)
The 10-year ASCVD risk score Mikey Bussing DC Brooke Bonito., et al., 2013) is: 41.2%   Values used to calculate the score:     Age: 65 years     Sex: Male     Is Non-Hispanic African American: No     Diabetic: Yes     Tobacco smoker: No     Systolic Blood Pressure: 683 mmHg     Is BP treated: Yes     HDL Cholesterol: 27 mg/dL     Total Cholesterol: 184 mg/dL

## 2021-03-27 NOTE — Progress Notes (Signed)
Assessment & Plan:  1. Type 2 diabetes mellitus with hyperglycemia, without long-term current use of insulin (HCC) Lab Results  Component Value Date   HGBA1C 6.3 03/27/2021   HGBA1C 7.5 (H) 12/08/2020    - Diabetes is at goal of A1c < 7. - Medications:  diet controlled - Home glucose monitoring: continue monitoring - Patient is not currently taking a statin (declined). Patient is taking an ACE-inhibitor/ARB.  - Instruction/counseling given: discussed diet  Diabetes Health Maintenance Due  Topic Date Due   HEMOGLOBIN A1C  09/26/2021   FOOT EXAM  12/19/2021   OPHTHALMOLOGY EXAM  01/09/2022    - Bayer DCA Hb A1c Waived - Lipid panel - CBC with Differential/Platelet - CMP14+EGFR  2. Prediabetes Patient successfully lowered A1c with lifestyle modifications.  3. Essential hypertension Well controlled on current regimen.  - Lipid panel - CBC with Differential/Platelet - CMP14+EGFR  4. Mixed hyperlipidemia Labs to assess. Patient declined statin. Discussed ASCVD risk score. - Lipid panel  5. Obesity (BMI 30.0-34.9) Patient has lost 10 lbs with diet and exercise. Encouraged to continue.   Return in about 6 months (around 09/26/2021) for follow-up of chronic medication conditions.  Hendricks Limes, MSN, APRN, FNP-C [facility]  Subjective:    Patient ID: Bryan Attig., male    DOB: 01-31-1956, 65 y.o.   MRN: [id number]  Patient Care Team: Loman Brooklyn, FNP as PCP - General (Family Medicine) Rigoberto Noel, MD as Consulting Physician (Pulmonary Disease) Harlen Labs, MD as Referring Physician (Optometry)   Chief Complaint:  Chief Complaint  Patient presents with   Diabetes    3 month follow up    HPI: Bryan Drone. is a 65 y.o. male presenting on 03/27/2021 for Diabetes (3 month follow up)  Diabetes: Patient presents for follow up of diabetes. Current symptoms include: hyperglycemia. Known diabetic complications: none.  Medication compliance: none.  Current diet: in general, a "healthy" diet  . Current exercise:  yes . Is he  on ACE inhibitor or angiotensin II receptor blocker? Yes. Is he on a statin? No. The 10-year ASCVD risk score Mikey Bussing DC Jr., et al., 2013) is: 41.2%.  Hypertension: Patient has been keeping a log of his BP at home. Systolic ranges 975-883 with 9/56 >140. Diastolic ranges 25-49 with 3/56 >90. He has only been taking Lisinopril 5 mg [facility] daily [facility] 10 [facility].   New complaints: None  Social history:  Relevant past medical, surgical, family and social history reviewed and updated as indicated. Interim medical history since our last visit reviewed.  Allergies and medications reviewed and updated.  DATA REVIEWED: CHART IN EPIC  ROS: Negative unless specifically indicated above in HPI.    Current Outpatient Medications:    aspirin EC 81 MG tablet, Take 81 mg by mouth daily., Disp: , Rfl:    blood glucose meter kit and supplies KIT, Dispense based on patient and insurance preference. Use up to four times daily as directed., Disp: 1 each, Rfl: 0   Cholecalciferol (VITAMIN D3) 50 MCG (2000 UT) capsule, Take 1,000 Units by mouth., Disp: , Rfl:    famotidine (PEPCID) 10 MG tablet, Take 10 mg by mouth 2 (two) times daily., Disp: , Rfl:    lisinopril (ZESTRIL) 10 MG tablet, Take 1 tablet (10 mg total) by mouth at bedtime. (Patient taking differently: Take 5 mg by mouth at bedtime.), Disp: 30 tablet, Rfl: 2  loratadine (CLARITIN) 10 MG tablet, Take 1 tablet by mouth daily., Disp: , Rfl:    Melatonin 3 MG CAPS, Take by mouth., Disp: , Rfl:    omega-3 acid ethyl esters (LOVAZA) 1 g capsule, Take 2 g by mouth daily., Disp: , Rfl:    UNABLE TO FIND, Tumeric capsule, 1 capsule once daily, Disp: , Rfl:    valACYclovir (VALTREX) 500 MG tablet, Take 500 mg by mouth 2 (two) times daily., Disp: , Rfl:    No Known Allergies Past Medical History:  Diagnosis Date    Acid reflux    Colon polyps    Diabetes mellitus type 2 in nonobese ([place]) 12/13/2020   Family history of genetic mutation for hereditary nonpolyposis colorectal cancer (HNPCC)    Family history of prostate cancer    Family history of stomach cancer    HOH (hard of hearing)    Hypertension    Sleep apnea    CPAP   Stroke ([place]) 2015   TIA (transient ischemic attack) 2016    Past Surgical History:  Procedure Laterality Date   CHOLECYSTECTOMY N/A 04/17/2020   Procedure: LAPAROSCOPIC CHOLECYSTECTOMY;  Surgeon: [name], MD;  Location: [facility];  Service: General;  Laterality: N/A;   COLONOSCOPY  2018    Social History   Socioeconomic History   Marital status: Married    Spouse name: [name]   Number of children: Not on file   Years of education: Not on file   Highest education level: Not on file  Occupational History   Occupation: retired     Comment: [employer]   Tobacco Use   Smoking status: Never   Smokeless tobacco: Never  Scientific laboratory technician Use: Never used  Substance and Sexual Activity   Alcohol use: No   Drug use: No   Sexual activity: Yes    Birth control/protection: None  Other Topics Concern   Not on file  Social History Narrative   Married , 1 step daughter    Social Determinants of Health   Financial Resource Strain: Not on file  Food Insecurity: Not on file  Transportation Needs: Not on file  Physical Activity: Not on file  Stress: Not on file  Social Connections: Not on file  Intimate Partner Violence: Not on file        Objective:    BP 138/82   Pulse 71   Temp (!) 97.3 F (36.3 C) (Temporal)   Ht '5\' 7"'  (1.702 m)   Wt 201 lb 3.2 oz (91.3 kg)   SpO2 99%   BMI 31.51 kg/m   Wt Readings from Last 3 Encounters:  03/27/21 201 lb 3.2 oz (91.3 kg)  12/19/20 211 lb (95.7 kg)  12/08/20 207 lb 9.6 oz (94.2 kg)    Physical Exam Vitals reviewed.  Constitutional:      General: He is not in acute distress.    Appearance: Normal appearance. He is  obese. He is not ill-appearing, toxic-appearing or diaphoretic.  HENT:     Head: Normocephalic and atraumatic.  Eyes:     General: No scleral icterus.       Right eye: No discharge.        Left eye: No discharge.     Conjunctiva/sclera: Conjunctivae normal.  Cardiovascular:     Rate and Rhythm: Normal rate.  Pulmonary:     Effort: Pulmonary effort is normal. No respiratory distress.  Musculoskeletal:        General: Normal range of motion.  Cervical back: Normal range of motion.  Skin:    General: Skin is warm and dry.  Neurological:     Mental Status: He is alert and oriented to person, place, and time. Mental status is at baseline.  Psychiatric:        Mood and Affect: Mood normal.        Behavior: Behavior normal.        Thought Content: Thought content normal.        Judgment: Judgment normal.    Lab Results  Component Value Date   TSH 1.440 12/08/2020   Lab Results  Component Value Date   WBC 5.5 12/08/2020   HGB 15.9 12/08/2020   HCT 47.8 12/08/2020   MCV 86 12/08/2020   PLT 211 12/08/2020   Lab Results  Component Value Date   NA 138 12/08/2020   K 4.6 12/08/2020   CO2 22 12/08/2020   GLUCOSE 131 (H) 12/08/2020   BUN 13 12/08/2020   CREATININE 1.19 12/08/2020   BILITOT 0.5 12/08/2020   ALKPHOS 70 12/08/2020   AST 36 12/08/2020   ALT 64 (H) 12/08/2020   PROT 6.7 12/08/2020   ALBUMIN 4.4 12/08/2020   CALCIUM 9.5 12/08/2020   ANIONGAP 8 04/18/2020   EGFR 69 12/08/2020   Lab Results  Component Value Date   CHOL 184 12/08/2020   Lab Results  Component Value Date   HDL 27 (L) 12/08/2020   Lab Results  Component Value Date   LDLCALC 123 (H) 12/08/2020   Lab Results  Component Value Date   TRIG 192 (H) 12/08/2020   Lab Results  Component Value Date   CHOLHDL 6.8 (H) 12/08/2020   Lab Results  Component Value Date   HGBA1C 7.5 (H) 12/08/2020

## 2021-03-28 ENCOUNTER — Encounter: Payer: Self-pay | Admitting: Family Medicine

## 2021-03-28 LAB — CMP14+EGFR
ALT: 39 IU/L (ref 0–44)
AST: 27 IU/L (ref 0–40)
Albumin/Globulin Ratio: 2.2 (ref 1.2–2.2)
Albumin: 4.6 g/dL (ref 3.8–4.8)
Alkaline Phosphatase: 68 IU/L (ref 44–121)
BUN/Creatinine Ratio: 14 (ref 10–24)
BUN: 15 mg/dL (ref 8–27)
Bilirubin Total: 0.6 mg/dL (ref 0.0–1.2)
CO2: 24 mmol/L (ref 20–29)
Calcium: 9.5 mg/dL (ref 8.6–10.2)
Chloride: 103 mmol/L (ref 96–106)
Creatinine, Ser: 1.11 mg/dL (ref 0.76–1.27)
Globulin, Total: 2.1 g/dL (ref 1.5–4.5)
Glucose: 120 mg/dL — ABNORMAL HIGH (ref 65–99)
Potassium: 4.5 mmol/L (ref 3.5–5.2)
Sodium: 142 mmol/L (ref 134–144)
Total Protein: 6.7 g/dL (ref 6.0–8.5)
eGFR: 74 mL/min/{1.73_m2} (ref >59)

## 2021-03-28 LAB — CBC WITH DIFFERENTIAL/PLATELET
Basophils Absolute: 0 10*3/uL (ref 0.0–0.2)
Basos: 1 %
EOS (ABSOLUTE): 0.1 10*3/uL (ref 0.0–0.4)
Eos: 2 %
Hematocrit: 49.2 % (ref 37.5–51.0)
Hemoglobin: 16.1 g/dL (ref 13.0–17.7)
Immature Grans (Abs): 0 10*3/uL (ref 0.0–0.1)
Immature Granulocytes: 0 %
Lymphocytes Absolute: 1.4 10*3/uL (ref 0.7–3.1)
Lymphs: 28 %
MCH: 28.5 pg (ref 26.6–33.0)
MCHC: 32.7 g/dL (ref 31.5–35.7)
MCV: 87 fL (ref 79–97)
Monocytes Absolute: 0.4 10*3/uL (ref 0.1–0.9)
Monocytes: 8 %
Neutrophils Absolute: 3.2 10*3/uL (ref 1.4–7.0)
Neutrophils: 61 %
Platelets: 172 10*3/uL (ref 150–450)
RBC: 5.65 x10E6/uL (ref 4.14–5.80)
RDW: 13.1 % (ref 11.6–15.4)
WBC: 5.2 10*3/uL (ref 3.4–10.8)

## 2021-03-28 LAB — LIPID PANEL
Chol/HDL Ratio: 5.2 ratio — ABNORMAL HIGH (ref 0.0–5.0)
Cholesterol, Total: 145 mg/dL (ref 100–199)
HDL: 28 mg/dL — ABNORMAL LOW (ref >39)
LDL Chol Calc (NIH): 98 mg/dL (ref 0–99)
Triglycerides: 101 mg/dL (ref 0–149)
VLDL Cholesterol Cal: 19 mg/dL (ref 5–40)

## 2021-08-28 ENCOUNTER — Encounter: Payer: Self-pay | Admitting: Family Medicine

## 2021-08-28 ENCOUNTER — Ambulatory Visit (INDEPENDENT_AMBULATORY_CARE_PROVIDER_SITE_OTHER): Admitting: Family Medicine

## 2021-08-28 ENCOUNTER — Other Ambulatory Visit: Payer: Self-pay

## 2021-08-28 VITALS — BP 137/77 | HR 72 | Temp 97.4°F | Ht 67.0 in | Wt 206.0 lb

## 2021-08-28 DIAGNOSIS — L814 Other melanin hyperpigmentation: Secondary | ICD-10-CM | POA: Diagnosis not present

## 2021-08-28 DIAGNOSIS — L705 Acne excoriee des jeunes filles: Secondary | ICD-10-CM

## 2021-08-28 NOTE — Progress Notes (Signed)
Assessment & Plan:  1. Age spots Reassurance provided.  2. Excoriated acne Offered referral to dermatology which patient declined. Continue applying petroleum jelly since this is helping.    Follow up plan: Return as scheduled.  Hendricks Limes, MSN, APRN, FNP-C [facility]  Subjective:   Patient ID: Bryan Strothers., male    DOB: 1956-05-20, 65 y.o.   MRN: [id number]  HPI: Bryan Costlow. is a 65 y.o. male presenting on 08/28/2021 for dark spot on head  Patient wants a spot looked at on the right side of his head just above his ear. Does not bother him, wife just noticed it.  Wife also sent word that she is concerned about the skin on his back as he is always picking at it and making it bleed. Patient reports he has had these bumps his whole life. He recently starting putting petroleum jelly on his chest which resolved them. He tries not to pick, but it is hard not to.   ROS: Negative unless specifically indicated above in HPI.   Relevant past medical history reviewed and updated as indicated.   Allergies and medications reviewed and updated.   Current Outpatient Medications:    aspirin EC 81 MG tablet, Take 81 mg by mouth daily., Disp: , Rfl:    blood glucose meter kit and supplies KIT, Dispense based on patient and insurance preference. Use up to four times daily as directed., Disp: 1 each, Rfl: 0   Cholecalciferol (VITAMIN D3) 50 MCG (2000 UT) capsule, Take 1,000 Units by mouth., Disp: , Rfl:    famotidine (PEPCID) 10 MG tablet, Take 10 mg by mouth 2 (two) times daily., Disp: , Rfl:    lisinopril (ZESTRIL) 10 MG tablet, Take 1 tablet (10 mg total) by mouth at bedtime. (Patient taking differently: Take 5 mg by mouth at bedtime.), Disp: 30 tablet, Rfl: 2   loratadine (CLARITIN) 10 MG tablet, Take 1 tablet by mouth daily., Disp: , Rfl:    Melatonin 3 MG CAPS, Take by mouth., Disp: , Rfl:    omega-3 acid ethyl esters (LOVAZA) 1 g capsule, Take 2 g by  mouth daily., Disp: , Rfl:    UNABLE TO FIND, Tumeric capsule, 1 capsule once daily, Disp: , Rfl:    valACYclovir (VALTREX) 500 MG tablet, Take 500 mg by mouth 2 (two) times daily. (Patient not taking: Reported on 08/28/2021), Disp: , Rfl:   No Known Allergies  Objective:   BP 137/77   Pulse 72   Temp (!) 97.4 F (36.3 C) (Temporal)   Ht _0  (1.702 m)   Wt 206 lb (93.4 kg)   SpO2 100%   BMI 32.26 kg/m    Physical Exam Vitals reviewed.  Constitutional:      General: He is not in acute distress.    Appearance: Normal appearance. He is not ill-appearing, toxic-appearing or diaphoretic.  HENT:     Head: Normocephalic and atraumatic.  Eyes:     General: No scleral icterus.       Right eye: No discharge.        Left eye: No discharge.     Conjunctiva/sclera: Conjunctivae normal.  Cardiovascular:     Rate and Rhythm: Normal rate.  Pulmonary:     Effort: Pulmonary effort is normal. No respiratory distress.  Musculoskeletal:        General: Normal range of motion.     Cervical back: Normal range of motion.  Skin:  General: Skin is warm and dry.     Findings: Acne (back) present.     Comments: Light brown age spot on right side of head, just above the ear.  Neurological:     Mental Status: He is alert and oriented to person, place, and time. Mental status is at baseline.  Psychiatric:        Mood and Affect: Mood normal.        Behavior: Behavior normal.        Thought Content: Thought content normal.        Judgment: Judgment normal.

## 2021-09-26 ENCOUNTER — Encounter: Payer: Self-pay | Admitting: Family Medicine

## 2021-09-26 ENCOUNTER — Ambulatory Visit (INDEPENDENT_AMBULATORY_CARE_PROVIDER_SITE_OTHER): Admitting: Family Medicine

## 2021-09-26 VITALS — BP 145/87 | HR 71 | Temp 97.2°F | Ht 67.0 in | Wt 208.0 lb

## 2021-09-26 DIAGNOSIS — E782 Mixed hyperlipidemia: Secondary | ICD-10-CM

## 2021-09-26 DIAGNOSIS — R197 Diarrhea, unspecified: Secondary | ICD-10-CM

## 2021-09-26 DIAGNOSIS — R7303 Prediabetes: Secondary | ICD-10-CM

## 2021-09-26 DIAGNOSIS — I10 Essential (primary) hypertension: Secondary | ICD-10-CM | POA: Diagnosis not present

## 2021-09-26 DIAGNOSIS — Z1211 Encounter for screening for malignant neoplasm of colon: Secondary | ICD-10-CM

## 2021-09-26 LAB — CMP14+EGFR
ALT: 52 IU/L — ABNORMAL HIGH (ref 0–44)
AST: 29 IU/L (ref 0–40)
Albumin/Globulin Ratio: 2.2 (ref 1.2–2.2)
Albumin: 4.6 g/dL (ref 3.8–4.8)
Alkaline Phosphatase: 69 IU/L (ref 44–121)
BUN/Creatinine Ratio: 13 (ref 10–24)
BUN: 16 mg/dL (ref 8–27)
Bilirubin Total: 0.6 mg/dL (ref 0.0–1.2)
CO2: 28 mmol/L (ref 20–29)
Calcium: 10.3 mg/dL — ABNORMAL HIGH (ref 8.6–10.2)
Chloride: 100 mmol/L (ref 96–106)
Creatinine, Ser: 1.21 mg/dL (ref 0.76–1.27)
Globulin, Total: 2.1 g/dL (ref 1.5–4.5)
Glucose: 158 mg/dL — ABNORMAL HIGH (ref 70–99)
Potassium: 4.5 mmol/L (ref 3.5–5.2)
Sodium: 140 mmol/L (ref 134–144)
Total Protein: 6.7 g/dL (ref 6.0–8.5)
eGFR: 67 mL/min/{1.73_m2} (ref >59)

## 2021-09-26 LAB — CBC WITH DIFFERENTIAL/PLATELET
Basophils Absolute: 0 10*3/uL (ref 0.0–0.2)
Basos: 1 %
EOS (ABSOLUTE): 0.2 10*3/uL (ref 0.0–0.4)
Eos: 3 %
Hematocrit: 49.5 % (ref 37.5–51.0)
Hemoglobin: 16.7 g/dL (ref 13.0–17.7)
Immature Grans (Abs): 0 10*3/uL (ref 0.0–0.1)
Immature Granulocytes: 1 %
Lymphocytes Absolute: 1.8 10*3/uL (ref 0.7–3.1)
Lymphs: 27 %
MCH: 29.5 pg (ref 26.6–33.0)
MCHC: 33.7 g/dL (ref 31.5–35.7)
MCV: 88 fL (ref 79–97)
Monocytes Absolute: 0.5 10*3/uL (ref 0.1–0.9)
Monocytes: 7 %
Neutrophils Absolute: 4.1 10*3/uL (ref 1.4–7.0)
Neutrophils: 61 %
Platelets: 188 10*3/uL (ref 150–450)
RBC: 5.66 x10E6/uL (ref 4.14–5.80)
RDW: 13.3 % (ref 11.6–15.4)
WBC: 6.6 10*3/uL (ref 3.4–10.8)

## 2021-09-26 LAB — LIPID PANEL
Chol/HDL Ratio: 5.6 ratio — ABNORMAL HIGH (ref 0.0–5.0)
Cholesterol, Total: 175 mg/dL (ref 100–199)
HDL: 31 mg/dL — ABNORMAL LOW (ref >39)
LDL Chol Calc (NIH): 114 mg/dL — ABNORMAL HIGH (ref 0–99)
Triglycerides: 171 mg/dL — ABNORMAL HIGH (ref 0–149)
VLDL Cholesterol Cal: 30 mg/dL (ref 5–40)

## 2021-09-26 LAB — BAYER DCA HB A1C WAIVED: HB A1C (BAYER DCA - WAIVED): 6.3 % — ABNORMAL HIGH (ref 4.8–5.6)

## 2021-09-26 NOTE — Progress Notes (Signed)
Assessment & Plan:  1. Prediabetes - encouraged healthy diet and exercise - Bayer DCA Hb A1c Waived, 6.3 today  2. Mixed hyperlipidemia - discussed statin, patient declined and will work on diet  - Lipid panel  3. Essential hypertension - continue 5 mg lisinopril  - education provided on hypertension - CBC with Differential/Platelet - CMP14+EGFR  4-5. Diarrhea, unspecified type/Colon cancer screening - Ambulatory referral to Gastroenterology   Return in about 6 months (around 03/27/2022) for annual physical.  Lucile Crater, NP Student  I personally was present during the history, physical exam, and medical decision-making activities of this service and have verified that the service and findings are accurately documented in the nurse practitioner student's note.  Hendricks Limes, MSN, APRN, FNP-C [facility]  Subjective:    Patient ID: Bryan Rainford., male    DOB: Sep 04, 1956, 65 y.o.   MRN: [id number]  Patient Care Team: Loman Brooklyn, FNP as PCP - General (Family Medicine) Rigoberto Noel, MD as Consulting Physician (Pulmonary Disease) Harlen Labs, MD as Referring Physician (Optometry)   Chief Complaint:  Chief Complaint  Patient presents with   Diabetes   Hypertension    6 month follow up of chronic medical conditions     HPI: Bryan Liebler. is a 65 y.o. male presenting on 09/26/2021 for Diabetes and Hypertension (6 month follow up of chronic medical conditions )  Prediabetes: Patient presents for follow up of diabetes. Current symptoms include: hyperglycemia. Known diabetic complications: none. Medication compliance: none.  Current diet: in general, a "healthy" diet  . Current exercise:  yes . Is he  on ACE inhibitor or angiotensin II receptor blocker? Yes (Lisinopril). Is he on a statin? No. He is not agreeable to starting a statin due to his wife's research on it. He is aware of his risk of cardiac event. He states he has a visit  scheduled with a dietician and he will work on eating right and reducing his sodium and cholesterol intake.   The 10-year ASCVD risk score (Arnett DK, et al., 2019) is: 33%   Values used to calculate the score:     Age: 3 years     Sex: Male     Is Non-Hispanic African American: No     Diabetic: Yes     Tobacco smoker: No     Systolic Blood Pressure: 802 mmHg     Is BP treated: Yes     HDL Cholesterol: 28 mg/dL     Total Cholesterol: 145 mg/dL   Hypertension: Patient has not been taking his blood pressures at home He has only been taking Lisinopril 5 mg once daily. He is not agreeable to increasing the dose to 10 mg at this time. He would like to work on diet and lifestyle changes to improve control.   New complaints: He is concerned about sporadic episodes of diarrhea that occur without regard to food intake. He repots no nausea or abdominal pain when these episodes occur. He has a history of colon polyps and his wife is concerned these may be contributing. His last colonoscopy was in 2018 and was advised to repeat in 5 years, so he is due for this. The last episode of diarrhea was 2 weeks ago.    Social history:  Relevant past medical, surgical, family and social history reviewed and updated as indicated. Interim medical history since our last visit reviewed.  Allergies and medications reviewed and updated.  DATA  REVIEWED: CHART IN EPIC  ROS: Negative unless specifically indicated above in HPI.    Current Outpatient Medications:    aspirin EC 81 MG tablet, Take 81 mg by mouth daily., Disp: , Rfl:    blood glucose meter kit and supplies KIT, Dispense based on patient and insurance preference. Use up to four times daily as directed., Disp: 1 each, Rfl: 0   Cholecalciferol (VITAMIN D3) 50 MCG (2000 UT) capsule, Take 1,000 Units by mouth., Disp: , Rfl:    famotidine (PEPCID) 10 MG tablet, Take 10 mg by mouth 2 (two) times daily., Disp: , Rfl:    lisinopril (ZESTRIL) 10 MG tablet,  Take 1 tablet (10 mg total) by mouth at bedtime. (Patient taking differently: Take 5 mg by mouth at bedtime.), Disp: 30 tablet, Rfl: 2   loratadine (CLARITIN) 10 MG tablet, Take 1 tablet by mouth daily., Disp: , Rfl:    Melatonin 3 MG CAPS, Take by mouth., Disp: , Rfl:    omega-3 acid ethyl esters (LOVAZA) 1 g capsule, Take 2 g by mouth daily., Disp: , Rfl:    UNABLE TO FIND, Tumeric capsule, 1 capsule once daily, Disp: , Rfl:    No Known Allergies Past Medical History:  Diagnosis Date   Acid reflux    Colon polyps    Diabetes mellitus type 2 in nonobese ([place]) 12/13/2020   Family history of genetic mutation for hereditary nonpolyposis colorectal cancer (HNPCC)    Family history of prostate cancer    Family history of stomach cancer    HOH (hard of hearing)    Hypertension    Sleep apnea    CPAP   Stroke ([place]) 2015   TIA (transient ischemic attack) 2016    Past Surgical History:  Procedure Laterality Date   CHOLECYSTECTOMY N/A 04/17/2020   Procedure: LAPAROSCOPIC CHOLECYSTECTOMY;  Surgeon: [name], MD;  Location: [facility];  Service: General;  Laterality: N/A;   COLONOSCOPY  2018    Social History   Socioeconomic History   Marital status: Married    Spouse name: Bryan Shepard   Number of children: Not on file   Years of education: Not on file   Highest education level: Not on file  Occupational History   Occupation: retired     Comment: [employer]   Tobacco Use   Smoking status: Never   Smokeless tobacco: Never  Scientific laboratory technician Use: Never used  Substance and Sexual Activity   Alcohol use: No   Drug use: No   Sexual activity: Yes    Birth control/protection: None  Other Topics Concern   Not on file  Social History Narrative   Married , 1 step daughter    Social Determinants of Health   Financial Resource Strain: Not on file  Food Insecurity: Not on file  Transportation Needs: Not on file  Physical Activity: Not on file  Stress: Not on file  Social Connections:  Not on file  Intimate Partner Violence: Not on file        Objective:    BP (!) 145/87    Pulse 71    Temp (!) 97.2 F (36.2 C) (Temporal)    Ht '5\' 7"'  (1.702 m)    Wt 94.3 kg    SpO2 98%    BMI 32.58 kg/m   Wt Readings from Last 3 Encounters:  09/26/21 208 lb (94.3 kg)  08/28/21 206 lb (93.4 kg)  03/27/21 201 lb 3.2 oz (91.3 kg)    Physical Exam  Vitals reviewed.  Constitutional:      General: He is not in acute distress.    Appearance: Normal appearance. He is obese. He is not ill-appearing, toxic-appearing or diaphoretic.  HENT:     Head: Normocephalic and atraumatic.  Eyes:     General: No scleral icterus.       Right eye: No discharge.        Left eye: No discharge.     Conjunctiva/sclera: Conjunctivae normal.  Cardiovascular:     Rate and Rhythm: Normal rate.  Pulmonary:     Effort: Pulmonary effort is normal. No respiratory distress.  Musculoskeletal:        General: Normal range of motion.     Cervical back: Normal range of motion.  Skin:    General: Skin is warm and dry.  Neurological:     Mental Status: He is alert and oriented to person, place, and time. Mental status is at baseline.  Psychiatric:        Mood and Affect: Mood normal.        Behavior: Behavior normal.        Thought Content: Thought content normal.        Judgment: Judgment normal.    Lab Results  Component Value Date   TSH 1.440 12/08/2020   Lab Results  Component Value Date   WBC 5.2 03/27/2021   HGB 16.1 03/27/2021   HCT 49.2 03/27/2021   MCV 87 03/27/2021   PLT 172 03/27/2021   Lab Results  Component Value Date   NA 142 03/27/2021   K 4.5 03/27/2021   CO2 24 03/27/2021   GLUCOSE 120 (H) 03/27/2021   BUN 15 03/27/2021   CREATININE 1.11 03/27/2021   BILITOT 0.6 03/27/2021   ALKPHOS 68 03/27/2021   AST 27 03/27/2021   ALT 39 03/27/2021   PROT 6.7 03/27/2021   ALBUMIN 4.6 03/27/2021   CALCIUM 9.5 03/27/2021   ANIONGAP 8 04/18/2020   EGFR 74 03/27/2021   Lab Results   Component Value Date   CHOL 145 03/27/2021   Lab Results  Component Value Date   HDL 28 (L) 03/27/2021   Lab Results  Component Value Date   LDLCALC 98 03/27/2021   Lab Results  Component Value Date   TRIG 101 03/27/2021   Lab Results  Component Value Date   CHOLHDL 5.2 (H) 03/27/2021   Lab Results  Component Value Date   HGBA1C 6.3 03/27/2021

## 2021-10-02 ENCOUNTER — Encounter: Payer: Self-pay | Admitting: Internal Medicine

## 2021-11-07 ENCOUNTER — Ambulatory Visit: Admitting: Internal Medicine

## 2021-11-15 ENCOUNTER — Encounter: Payer: Self-pay | Admitting: Internal Medicine

## 2021-11-15 ENCOUNTER — Encounter: Payer: Self-pay | Admitting: *Deleted

## 2021-11-15 ENCOUNTER — Ambulatory Visit (INDEPENDENT_AMBULATORY_CARE_PROVIDER_SITE_OTHER): Admitting: Internal Medicine

## 2021-11-15 ENCOUNTER — Other Ambulatory Visit: Payer: Self-pay

## 2021-11-15 VITALS — BP 155/86 | HR 89 | Temp 97.3°F | Ht 67.0 in | Wt 214.8 lb

## 2021-11-15 DIAGNOSIS — D126 Benign neoplasm of colon, unspecified: Secondary | ICD-10-CM

## 2021-11-15 DIAGNOSIS — K5900 Constipation, unspecified: Secondary | ICD-10-CM | POA: Diagnosis not present

## 2021-11-15 MED ORDER — PEG 3350-KCL-NA BICARB-NACL 420 G PO SOLR: ORAL | 0 refills | Status: DC

## 2021-11-15 NOTE — H&P (View-Only) (Signed)
Primary Care Physician:  Loman Brooklyn, FNP Primary Gastroenterologist:  Dr. Abbey Chatters  Chief Complaint  Patient presents with   Colonoscopy    Consult. Last tcs approx 5 years ago-doesn't recall having polyps. Pt requested referral from PCP for another tcs   Diarrhea    occ    HPI:   Bryan Shepard. is a 66 y.o. male who presents to clinic today by referral from his PCP Hendricks Limes for evaluation to discuss colonoscopy.  Last colonoscopy 2018 which was normal although does have a history of numerous polyps in the past.  Also with family history of Lynch syndrome.  States he was negative for Lynch gene.  No melena hematochezia.  Does note mild constipation with occasional overflow diarrhea.  No abdominal pain.  No unintentional weight loss.  Denies any upper GI symptoms including heartburn, reflux, dysphagia/odynophagia, epigastric/chest pain.  Past Medical History:  Diagnosis Date   Acid reflux    Colon polyps    Diabetes mellitus type 2 in nonobese ([place]) 12/13/2020   Family history of genetic mutation for hereditary nonpolyposis colorectal cancer (HNPCC)    Family history of prostate cancer    Family history of stomach cancer    HOH (hard of hearing)    Hypertension    Sleep apnea    CPAP   Stroke ([place]) 2015   TIA (transient ischemic attack) 2016    Past Surgical History:  Procedure Laterality Date   CHOLECYSTECTOMY N/A 04/17/2020   Procedure: LAPAROSCOPIC CHOLECYSTECTOMY;  Surgeon: [name], MD;  Location: [facility];  Service: General;  Laterality: N/A;   COLONOSCOPY  2018    Current Outpatient Medications  Medication Sig Dispense Refill   L-Arginine 500 MG CAPS Take 2 capsules by mouth daily.     lisinopril (ZESTRIL) 10 MG tablet Take 1 tablet (10 mg total) by mouth at bedtime. (Patient taking differently: Take 5 mg by mouth at bedtime.) 30 tablet 2   OVER THE COUNTER MEDICATION Raw Moringa Oleifera 334m takes six times daily     OVER THE COUNTER MEDICATION  Sucontral D takes twice daily     UNABLE TO FIND Tumeric capsule, 1 capsule once daily     Vitamin D-Vitamin K (VITAMIN K2-VITAMIN D3 PO) Take 1 tablet by mouth daily.     aspirin EC 81 MG tablet Take 81 mg by mouth daily. (Patient not taking: Reported on 11/15/2021)     blood glucose meter kit and supplies KIT Dispense based on patient and insurance preference. Use up to four times daily as directed. (Patient not taking: Reported on 11/15/2021) 1 each 0   Cholecalciferol (VITAMIN D3) 50 MCG (2000 UT) capsule Take 1,000 Units by mouth. (Patient not taking: Reported on 11/15/2021)     famotidine (PEPCID) 10 MG tablet Take 10 mg by mouth 2 (two) times daily. (Patient not taking: Reported on 11/15/2021)     loratadine (CLARITIN) 10 MG tablet Take 1 tablet by mouth daily. (Patient not taking: Reported on 11/15/2021)     Melatonin 3 MG CAPS Take by mouth. (Patient not taking: Reported on 11/15/2021)     omega-3 acid ethyl esters (LOVAZA) 1 g capsule Take 2 g by mouth daily. (Patient not taking: Reported on 11/15/2021)     No current facility-administered medications for this visit.    Allergies as of 11/15/2021   (No Known Allergies)    Family History  Problem Relation Age of Onset   Melanoma Mother 842  Hypertension Mother  Transient ischemic attack Mother    Stroke Mother    Heart disease Mother    Prostate cancer Father 38   Stomach cancer Father 99       PMS2+; Lynch syndrome   Melanoma Father    Diabetes Father    Heart disease Father    Stroke Maternal Grandmother    Lung cancer Paternal Grandmother    Throat cancer Paternal Grandmother    Prostate cancer Paternal Grandfather 66   Bladder Cancer Other        MGM's mother with urethra cancer    Social History   Socioeconomic History   Marital status: Married    Spouse name: [name]   Number of children: Not on file   Years of education: Not on file   Highest education level: Not on file  Occupational History   Occupation: retired      Comment: [employer]   Tobacco Use   Smoking status: Never   Smokeless tobacco: Never  Scientific laboratory technician Use: Never used  Substance and Sexual Activity   Alcohol use: No   Drug use: No   Sexual activity: Yes    Birth control/protection: None  Other Topics Concern   Not on file  Social History Narrative   Married , 1 step daughter    Social Determinants of Health   Financial Resource Strain: Not on file  Food Insecurity: Not on file  Transportation Needs: Not on file  Physical Activity: Not on file  Stress: Not on file  Social Connections: Not on file  Intimate Partner Violence: Not on file    Subjective: Review of Systems  Constitutional:  Negative for chills and fever.  HENT:  Negative for congestion and hearing loss.   Eyes:  Negative for blurred vision and double vision.  Respiratory:  Negative for cough and shortness of breath.   Cardiovascular:  Negative for chest pain and palpitations.  Gastrointestinal:  Negative for abdominal pain, blood in stool, constipation, diarrhea, heartburn, melena and vomiting.  Genitourinary:  Negative for dysuria and urgency.  Musculoskeletal:  Negative for joint pain and myalgias.  Skin:  Negative for itching and rash.  Neurological:  Negative for dizziness and headaches.  Psychiatric/Behavioral:  Negative for depression. The patient is not nervous/anxious.       Objective: BP (!) 155/86    Pulse 89    Temp (!) 97.3 F (36.3 C) (Temporal)    Ht '5\' 7"'  (1.702 m)    Wt 214 lb 12.8 oz (97.4 kg)    BMI 33.64 kg/m  Physical Exam Constitutional:      Appearance: Normal appearance.  HENT:     Head: Normocephalic and atraumatic.  Eyes:     Extraocular Movements: Extraocular movements intact.     Conjunctiva/sclera: Conjunctivae normal.  Cardiovascular:     Rate and Rhythm: Normal rate and regular rhythm.  Pulmonary:     Effort: Pulmonary effort is normal.     Breath sounds: Normal breath sounds.  Abdominal:     General: Bowel  sounds are normal.     Palpations: Abdomen is soft.  Musculoskeletal:        General: Normal range of motion.     Cervical back: Normal range of motion and neck supple.  Skin:    General: Skin is warm.  Neurological:     General: No focal deficit present.     Mental Status: He is alert and oriented to person, place, and time.  Psychiatric:  Mood and Affect: Mood normal.        Behavior: Behavior normal.     Assessment: *History of adenomatous colon polyps *Family history of stomach cancer in father (lynch syndrome) *Constipation-mild, intermittent  Plan: Will schedule for surveillance colonoscopy.The risks including infection, bleed, or perforation as well as benefits, limitations, alternatives and imponderables have been reviewed with the patient. Questions have been answered. All parties agreeable.  For patient's constipation, I recommended taking MiraLAX 1 capful daily.  If this is not adequate then increase to 2 capfuls daily.  If this is still not adequate then add on once daily Dulcolax.  Also recommended patient start taking fiber therapy.  Print out given to patient today.  Encouraged to drink at least 6 glasses of water daily.  Patient to follow-up after procedure.  Thank you Hendricks Limes for the kind referral.  11/15/2021 3:03 PM   Disclaimer: This note was dictated with voice recognition software. Similar sounding words can inadvertently be transcribed and may not be corrected upon review.

## 2021-11-15 NOTE — Progress Notes (Signed)
Primary Care Physician:  Loman Brooklyn, FNP Primary Gastroenterologist:  Dr. Abbey Chatters  Chief Complaint  Patient presents with   Colonoscopy    Consult. Last tcs approx 5 years ago-doesn't recall having polyps. Pt requested referral from PCP for another tcs   Diarrhea    occ    HPI:   Bryan Shepard. is a 66 y.o. male who presents to clinic today by referral from his PCP Hendricks Limes for evaluation to discuss colonoscopy.  Last colonoscopy 2018 which was normal although does have a history of numerous polyps in the past.  Also with family history of Lynch syndrome.  States he was negative for Lynch gene.  No melena hematochezia.  Does note mild constipation with occasional overflow diarrhea.  No abdominal pain.  No unintentional weight loss.  Denies any upper GI symptoms including heartburn, reflux, dysphagia/odynophagia, epigastric/chest pain.  Past Medical History:  Diagnosis Date   Acid reflux    Colon polyps    Diabetes mellitus type 2 in nonobese ([place]) 12/13/2020   Family history of genetic mutation for hereditary nonpolyposis colorectal cancer (HNPCC)    Family history of prostate cancer    Family history of stomach cancer    HOH (hard of hearing)    Hypertension    Sleep apnea    CPAP   Stroke ([place]) 2015   TIA (transient ischemic attack) 2016    Past Surgical History:  Procedure Laterality Date   CHOLECYSTECTOMY N/A 04/17/2020   Procedure: LAPAROSCOPIC CHOLECYSTECTOMY;  Surgeon: [name], MD;  Location: [facility];  Service: General;  Laterality: N/A;   COLONOSCOPY  2018    Current Outpatient Medications  Medication Sig Dispense Refill   L-Arginine 500 MG CAPS Take 2 capsules by mouth daily.     lisinopril (ZESTRIL) 10 MG tablet Take 1 tablet (10 mg total) by mouth at bedtime. (Patient taking differently: Take 5 mg by mouth at bedtime.) 30 tablet 2   OVER THE COUNTER MEDICATION Raw Moringa Oleifera 373m takes six times daily     OVER THE COUNTER MEDICATION  Sucontral D takes twice daily     UNABLE TO FIND Tumeric capsule, 1 capsule once daily     Vitamin D-Vitamin K (VITAMIN K2-VITAMIN D3 PO) Take 1 tablet by mouth daily.     aspirin EC 81 MG tablet Take 81 mg by mouth daily. (Patient not taking: Reported on 11/15/2021)     blood glucose meter kit and supplies KIT Dispense based on patient and insurance preference. Use up to four times daily as directed. (Patient not taking: Reported on 11/15/2021) 1 each 0   Cholecalciferol (VITAMIN D3) 50 MCG (2000 UT) capsule Take 1,000 Units by mouth. (Patient not taking: Reported on 11/15/2021)     famotidine (PEPCID) 10 MG tablet Take 10 mg by mouth 2 (two) times daily. (Patient not taking: Reported on 11/15/2021)     loratadine (CLARITIN) 10 MG tablet Take 1 tablet by mouth daily. (Patient not taking: Reported on 11/15/2021)     Melatonin 3 MG CAPS Take by mouth. (Patient not taking: Reported on 11/15/2021)     omega-3 acid ethyl esters (LOVAZA) 1 g capsule Take 2 g by mouth daily. (Patient not taking: Reported on 11/15/2021)     No current facility-administered medications for this visit.    Allergies as of 11/15/2021   (No Known Allergies)    Family History  Problem Relation Age of Onset   Melanoma Mother 845  Hypertension Mother  Transient ischemic attack Mother    Stroke Mother    Heart disease Mother    Prostate cancer Father 37   Stomach cancer Father 9       PMS2+; Lynch syndrome   Melanoma Father    Diabetes Father    Heart disease Father    Stroke Maternal Grandmother    Lung cancer Paternal Grandmother    Throat cancer Paternal Grandmother    Prostate cancer Paternal Grandfather 64   Bladder Cancer Other        MGM's mother with urethra cancer    Social History   Socioeconomic History   Marital status: Married    Spouse name: [name]   Number of children: Not on file   Years of education: Not on file   Highest education level: Not on file  Occupational History   Occupation: retired      Comment: [employer]   Tobacco Use   Smoking status: Never   Smokeless tobacco: Never  Scientific laboratory technician Use: Never used  Substance and Sexual Activity   Alcohol use: No   Drug use: No   Sexual activity: Yes    Birth control/protection: None  Other Topics Concern   Not on file  Social History Narrative   Married , 1 step daughter    Social Determinants of Health   Financial Resource Strain: Not on file  Food Insecurity: Not on file  Transportation Needs: Not on file  Physical Activity: Not on file  Stress: Not on file  Social Connections: Not on file  Intimate Partner Violence: Not on file    Subjective: Review of Systems  Constitutional:  Negative for chills and fever.  HENT:  Negative for congestion and hearing loss.   Eyes:  Negative for blurred vision and double vision.  Respiratory:  Negative for cough and shortness of breath.   Cardiovascular:  Negative for chest pain and palpitations.  Gastrointestinal:  Negative for abdominal pain, blood in stool, constipation, diarrhea, heartburn, melena and vomiting.  Genitourinary:  Negative for dysuria and urgency.  Musculoskeletal:  Negative for joint pain and myalgias.  Skin:  Negative for itching and rash.  Neurological:  Negative for dizziness and headaches.  Psychiatric/Behavioral:  Negative for depression. The patient is not nervous/anxious.       Objective: BP (!) 155/86    Pulse 89    Temp (!) 97.3 F (36.3 C) (Temporal)    Ht '5\' 7"'  (1.702 m)    Wt 214 lb 12.8 oz (97.4 kg)    BMI 33.64 kg/m  Physical Exam Constitutional:      Appearance: Normal appearance.  HENT:     Head: Normocephalic and atraumatic.  Eyes:     Extraocular Movements: Extraocular movements intact.     Conjunctiva/sclera: Conjunctivae normal.  Cardiovascular:     Rate and Rhythm: Normal rate and regular rhythm.  Pulmonary:     Effort: Pulmonary effort is normal.     Breath sounds: Normal breath sounds.  Abdominal:     General: Bowel  sounds are normal.     Palpations: Abdomen is soft.  Musculoskeletal:        General: Normal range of motion.     Cervical back: Normal range of motion and neck supple.  Skin:    General: Skin is warm.  Neurological:     General: No focal deficit present.     Mental Status: He is alert and oriented to person, place, and time.  Psychiatric:  Mood and Affect: Mood normal.        Behavior: Behavior normal.     Assessment: *History of adenomatous colon polyps *Family history of stomach cancer in father (lynch syndrome) *Constipation-mild, intermittent  Plan: Will schedule for surveillance colonoscopy.The risks including infection, bleed, or perforation as well as benefits, limitations, alternatives and imponderables have been reviewed with the patient. Questions have been answered. All parties agreeable.  For patient's constipation, I recommended taking MiraLAX 1 capful daily.  If this is not adequate then increase to 2 capfuls daily.  If this is still not adequate then add on once daily Dulcolax.  Also recommended patient start taking fiber therapy.  Print out given to patient today.  Encouraged to drink at least 6 glasses of water daily.  Patient to follow-up after procedure.  Thank you Hendricks Limes for the kind referral.  11/15/2021 3:03 PM   Disclaimer: This note was dictated with voice recognition software. Similar sounding words can inadvertently be transcribed and may not be corrected upon review.

## 2021-11-15 NOTE — Patient Instructions (Signed)
We will schedule you for colonoscopy given your history of polyps in the past.  For your constipation, I want you to start taking over the counter MiraLAX 1 capful daily.  If this does not adequately control your constipation, I would increase to 2 capfuls daily.  If this is still not adequate, then I would add on once daily Dulcolax (bisacodyl) tablet.   I also recommend increasing fiber in your diet or adding OTC Benefiber/Metamucil. Be sure to drink at least 6 glasses of water daily.   It was very nice meeting you today.  Thank you for your service to our country.  Dr. Abbey Chatters  At [facility] we value your feedback. You may receive a survey about your visit today. Please share your experience as we strive to create trusting relationships with our patients to provide genuine, compassionate, quality care.  We appreciate your understanding and patience as we review any laboratory studies, imaging, and other diagnostic tests that are ordered as we care for you. Our office policy is 5 business days for review of these results, and any emergent or urgent results are addressed in a timely manner for your best interest. If you do not hear from our office in 1 week, please contact us.   We also encourage the use of MyChart, which contains your medical information for your review as well. If you are not enrolled in this feature, an access code is on this after visit summary for your convenience. Thank you for allowing [place] to be involved in your care.  It was great to see you today!  I hope you have a great rest of your Winter!    Elon Alas. Abbey Chatters, D.O. Gastroenterology and Hepatology [facility]

## 2021-12-04 ENCOUNTER — Telehealth: Payer: Self-pay | Admitting: Internal Medicine

## 2021-12-04 NOTE — Telephone Encounter (Signed)
Noted. List updated

## 2021-12-04 NOTE — Telephone Encounter (Signed)
Patient called to update his supplements before his procedure Krill oil 2034mcg 2x day Chromium picolinate 102mcg 1 a day Vitamin k2 +d3 39mcg and 125 mcg Moringaoleifer 350mg  6 a day Sucontral-d 2 a day Tumeric 500 mg 1 a day Zinc 50mg  1 a day

## 2021-12-07 ENCOUNTER — Encounter (HOSPITAL_COMMUNITY)
Admission: RE | Disposition: A | Discharge status: Home / Self Care | Payer: Self-pay | Source: Home / Self Care | Attending: Internal Medicine

## 2021-12-07 ENCOUNTER — Ambulatory Visit (HOSPITAL_COMMUNITY): Admitting: Anesthesiology

## 2021-12-07 ENCOUNTER — Encounter (HOSPITAL_COMMUNITY): Payer: Self-pay

## 2021-12-07 ENCOUNTER — Ambulatory Visit (HOSPITAL_COMMUNITY)
Admission: RE | Admit: 2021-12-07 | Discharge: 2021-12-07 | Disposition: A | Discharge status: Home / Self Care | Attending: Internal Medicine | Admitting: Internal Medicine

## 2021-12-07 ENCOUNTER — Other Ambulatory Visit: Payer: Self-pay

## 2021-12-07 ENCOUNTER — Ambulatory Visit (HOSPITAL_BASED_OUTPATIENT_CLINIC_OR_DEPARTMENT_OTHER): Admitting: Anesthesiology

## 2021-12-07 DIAGNOSIS — Z1211 Encounter for screening for malignant neoplasm of colon: Secondary | ICD-10-CM | POA: Diagnosis present

## 2021-12-07 DIAGNOSIS — D124 Benign neoplasm of descending colon: Secondary | ICD-10-CM | POA: Insufficient documentation

## 2021-12-07 DIAGNOSIS — K635 Polyp of colon: Secondary | ICD-10-CM | POA: Diagnosis not present

## 2021-12-07 DIAGNOSIS — K648 Other hemorrhoids: Secondary | ICD-10-CM | POA: Diagnosis not present

## 2021-12-07 DIAGNOSIS — Z86010 Personal history of colonic polyps: Secondary | ICD-10-CM | POA: Diagnosis not present

## 2021-12-07 DIAGNOSIS — I10 Essential (primary) hypertension: Secondary | ICD-10-CM | POA: Insufficient documentation

## 2021-12-07 DIAGNOSIS — E119 Type 2 diabetes mellitus without complications: Secondary | ICD-10-CM | POA: Insufficient documentation

## 2021-12-07 DIAGNOSIS — Z8673 Personal history of transient ischemic attack (TIA), and cerebral infarction without residual deficits: Secondary | ICD-10-CM | POA: Diagnosis not present

## 2021-12-07 DIAGNOSIS — K219 Gastro-esophageal reflux disease without esophagitis: Secondary | ICD-10-CM | POA: Diagnosis not present

## 2021-12-07 DIAGNOSIS — G4730 Sleep apnea, unspecified: Secondary | ICD-10-CM | POA: Insufficient documentation

## 2021-12-07 HISTORY — PX: POLYPECTOMY: SHX5525

## 2021-12-07 HISTORY — PX: COLONOSCOPY WITH PROPOFOL: SHX5780

## 2021-12-07 LAB — GLUCOSE, CAPILLARY: Glucose-Capillary: 134 mg/dL — ABNORMAL HIGH (ref 70–99)

## 2021-12-07 SURGERY — COLONOSCOPY WITH PROPOFOL
Anesthesia: General

## 2021-12-07 MED ORDER — LIDOCAINE HCL (CARDIAC) PF 100 MG/5ML IV SOSY
PREFILLED_SYRINGE | INTRAVENOUS | Status: DC | PRN
  Administered 2021-12-07: 50 mg via INTRAVENOUS

## 2021-12-07 MED ORDER — PROPOFOL 10 MG/ML IV BOLUS
INTRAVENOUS | Status: DC | PRN
  Administered 2021-12-07: 150 ug/kg/min via INTRAVENOUS
  Administered 2021-12-07: 100 mg via INTRAVENOUS

## 2021-12-07 MED ORDER — LACTATED RINGERS IV SOLN: INTRAVENOUS | Status: DC

## 2021-12-07 NOTE — Transfer of Care (Signed)
Immediate Anesthesia Transfer of Care Note ? ?Patient: Bryan Shepard. ? ?Procedure(s) Performed: COLONOSCOPY WITH PROPOFOL ?POLYPECTOMY ? ?Patient Location: Endoscopy Unit ? ?Anesthesia Type:General ? ?Level of Consciousness: drowsy ? ?Airway & Oxygen Therapy: Patient Spontanous Breathing ? ?Post-op Assessment: Report given to RN and Post -op Vital signs reviewed and stable ? ?Post vital signs: Reviewed and stable ? ?Last Vitals:  ?Vitals Value Taken Time  ?BP    ?Temp    ?Pulse 77   ?Resp 12   ?SpO2 93%   ? ? ?Last Pain:  ?Vitals:  ? 12/07/21 0932  ?TempSrc:   ?PainSc: 0-No pain  ?   ? ?Patients Stated Pain Goal: 10 (12/07/21 0847) ? ?Complications: No notable events documented. ?

## 2021-12-07 NOTE — Interval H&P Note (Signed)
History and Physical Interval Note: ? ?12/07/2021 ?9:04 AM ? ?Bryan Shepard.  has presented today for surgery, with the diagnosis of hx polyps.  The various methods of treatment have been discussed with the patient and family. After consideration of risks, benefits and other options for treatment, the patient has consented to  Procedure(s) with comments: ?COLONOSCOPY WITH PROPOFOL (N/A) - 9:45am as a surgical intervention.  The patient's history has been reviewed, patient examined, no change in status, stable for surgery.  I have reviewed the patient's chart and labs.  Questions were answered to the patient's satisfaction.   ? ? ?[name] ? ? ?

## 2021-12-07 NOTE — Op Note (Signed)
[facility] ?Patient Name: Bryan Shepard ?Procedure Date: 12/07/2021 9:18 AM ?MRN: [id number] ?Date of Birth: 1956/09/15 ?Attending MD: Elon Alas. Abbey Chatters , DO ?CSN: [id number] ?Age: 66 ?Admit Type: Outpatient ?Procedure:                Colonoscopy ?Indications:              High risk colon cancer surveillance: Personal  ?                          history of colonic polyps ?Providers:                Elon Alas. Abbey Chatters, DO, Heyburn Page, [place]                          Risa Grill, Technician ?Referring MD:              ?Medicines:                See the Anesthesia note for documentation of the  ?                          administered medications ?Complications:            No immediate complications. ?Estimated Blood Loss:     Estimated blood loss was minimal. ?Procedure:                Pre-Anesthesia Assessment: ?                          - The anesthesia plan was to use monitored  ?                          anesthesia care (MAC). ?                          After obtaining informed consent, the colonoscope  ?                          was passed under direct vision. Throughout the  ?                          procedure, the patient's blood pressure, pulse, and  ?                          oxygen saturations were monitored continuously. The  ?                          PCF-HQ190L ([id number]) scope was introduced through  ?                          the anus and advanced to the the cecum, identified  ?                          by appendiceal orifice and ileocecal valve. The  ?                          colonoscopy was performed without difficulty. The  ?  patient tolerated the procedure well. The quality  ?                          of the bowel preparation was evaluated using the  ?                          BBPS [facility] Bowel Preparation Scale) with scores  ?                          of: Right Colon = 3, Transverse Colon = 3 and Left  ?                          Colon = 3 (entire mucosa seen well  with no residual  ?                          staining, small fragments of stool or opaque  ?                          liquid). The total BBPS score equals 9. ?Scope In: 9:23:45 AM ?Scope Out: 9:38:02 AM ?Scope Withdrawal Time: 0 hours 11 minutes 31 seconds  ?Total Procedure Duration: 0 hours 14 minutes 17 seconds  ?Findings: ?     The perianal and digital rectal examinations were normal. ?     Non-bleeding internal hemorrhoids were found during endoscopy. ?     A 8 mm polyp was found in the descending colon. The polyp was  ?     semi-pedunculated. The polyp was removed with a cold snare. Resection  ?     and retrieval were complete. ?     The exam was otherwise without abnormality. ?Impression:               - Non-bleeding internal hemorrhoids. ?                          - One 8 mm polyp in the descending colon, removed  ?                          with a cold snare. Resected and retrieved. ?                          - The examination was otherwise normal. ?Moderate Sedation: ?     Per Anesthesia Care ?Recommendation:           - Patient has a contact number available for  ?                          emergencies. The signs and symptoms of potential  ?                          delayed complications were discussed with the  ?                          patient. Return to normal activities tomorrow.  ?  Written discharge instructions were provided to the  ?                          patient. ?                          - Resume previous diet. ?                          - Continue present medications. ?                          - Await pathology results. ?                          - Repeat colonoscopy in 5 years for surveillance. ?                          - Return to GI clinic PRN. ?Procedure Code(s):        --- Professional --- ?                          [phone], Colonoscopy, flexible; with removal of  ?                          tumor(s), polyp(s), or other lesion(s) by snare  ?                           technique ?Diagnosis Code(s):        --- Professional --- ?                          Z86.010, Personal history of colonic polyps ?                          K63.5, Polyp of colon ?                          K64.8, Other hemorrhoids ?CPT copyright 2019 American Medical Association. All rights reserved. ?The codes documented in this report are preliminary and upon coder review may  ?be revised to meet current compliance requirements. ?Elon Alas. Abbey Chatters, DO ?Elon Alas. Bruce, DO ?12/07/2021 9:43:04 AM ?This report has been signed electronically. ?Number of Addenda: 0 ?

## 2021-12-07 NOTE — Anesthesia Postprocedure Evaluation (Signed)
Anesthesia Post Note ? ?Patient: Bryan Shepard. ? ?Procedure(s) Performed: COLONOSCOPY WITH PROPOFOL ?POLYPECTOMY ? ?Patient location during evaluation: Endoscopy ?Anesthesia Type: General ?Level of consciousness: awake and alert ?Pain management: pain level controlled ?Vital Signs Assessment: post-procedure vital signs reviewed and stable ?Respiratory status: spontaneous breathing, nonlabored ventilation and respiratory function stable ?Cardiovascular status: blood pressure returned to baseline and stable ?Postop Assessment: no apparent nausea or vomiting ?Anesthetic complications: no ? ? ?No notable events documented. ? ? ?Last Vitals:  ?Vitals:  ? 12/07/21 0941 12/07/21 0944  ?BP: (!) 106/57 109/86  ?Pulse: 79 76  ?Resp: 12 (!) 21  ?Temp: 36.4 ?C   ?SpO2: 94% 94%  ?  ?Last Pain:  ?Vitals:  ? 12/07/21 0944  ?TempSrc:   ?PainSc: 0-No pain  ? ? ?  ?  ?  ?  ?  ?  ? ?Makenzee Choudhry C Noami Bove ? ? ? ? ?

## 2021-12-07 NOTE — Discharge Instructions (Addendum)
?  Colonoscopy Discharge Instructions  Read the instructions outlined below and refer to this sheet in the next few weeks. These discharge instructions provide you with general information on caring for yourself after you leave the hospital. Your doctor may also give you specific instructions. While your treatment has been planned according to the most current medical practices available, unavoidable complications occasionally occur.   ACTIVITY You may resume your regular activity, but move at a slower pace for the next 24 hours.  Take frequent rest periods for the next 24 hours.  Walking will help get rid of the air and reduce the bloated feeling in your belly (abdomen).  No driving for 24 hours (because of the medicine (anesthesia) used during the test).   Do not sign any important legal documents or operate any machinery for 24 hours (because of the anesthesia used during the test).  NUTRITION Drink plenty of fluids.  You may resume your normal diet as instructed by your doctor.  Begin with a light meal and progress to your normal diet. Heavy or fried foods are harder to digest and may make you feel sick to your stomach (nauseated).  Avoid alcoholic beverages for 24 hours or as instructed.  MEDICATIONS You may resume your normal medications unless your doctor tells you otherwise.  WHAT YOU CAN EXPECT TODAY Some feelings of bloating in the abdomen.  Passage of more gas than usual.  Spotting of blood in your stool or on the toilet paper.  IF YOU HAD POLYPS REMOVED DURING THE COLONOSCOPY: No aspirin products for 7 days or as instructed.  No alcohol for 7 days or as instructed.  Eat a soft diet for the next 24 hours.  FINDING OUT THE RESULTS OF YOUR TEST Not all test results are available during your visit. If your test results are not back during the visit, make an appointment with your caregiver to find out the results. Do not assume everything is normal if you have not heard from your  caregiver or the medical facility. It is important for you to follow up on all of your test results.  SEEK IMMEDIATE MEDICAL ATTENTION IF: You have more than a spotting of blood in your stool.  Your belly is swollen (abdominal distention).  You are nauseated or vomiting.  You have a temperature over 101.  You have abdominal pain or discomfort that is severe or gets worse throughout the day.   Your colonoscopy revealed 1 polyp(s) which I removed successfully. Await pathology results, my office will contact you. I recommend repeating colonoscopy in 5 years for surveillance purposes. Otherwise follow up with GI as needed.    I hope you have a great rest of your week!  Charles K. Carver, D.O. Gastroenterology and Hepatology [facility]  

## 2021-12-07 NOTE — Anesthesia Preprocedure Evaluation (Signed)
Anesthesia Evaluation  ?Patient identified by MRN, date of birth, ID band ?Patient awake ? ? ? ?Reviewed: ?Allergy & Precautions, NPO status , Patient's Chart, lab work & pertinent test results ? ?Airway ? ? ? ? ? ? ? Dental ? ?(+) Dental Advisory Given ?  ?Pulmonary ?sleep apnea and Continuous Positive Airway Pressure Ventilation ,  ?  ?Pulmonary exam normal ?breath sounds clear to auscultation ? ? ? ? ? ? Cardiovascular ?Exercise Tolerance: Good ?hypertension, Pt. on medications ?Normal cardiovascular exam ?Rhythm:Regular Rate:Normal ? ? ?  ?Neuro/Psych ?TIACVA, No Residual Symptoms negative psych ROS  ? GI/Hepatic ?Neg liver ROS, GERD  Controlled,  ?Endo/Other  ?negative endocrine ROSdiabetes ? Renal/GU ?negative Renal ROS  ?negative genitourinary ?  ?Musculoskeletal ?negative musculoskeletal ROS ?(+)  ? Abdominal ?  ?Peds ?negative pediatric ROS ?(+)  Hematology ?negative hematology ROS ?(+)   ?Anesthesia Other Findings ? ? Reproductive/Obstetrics ?negative OB ROS ? ?  ? ? ? ? ? ? ? ? ? ? ? ? ? ?  ?  ? ? ? ? ? ? ? ? ?Anesthesia Physical ?Anesthesia Plan ? ?ASA: 3 ? ?Anesthesia Plan: General  ? ?Post-op Pain Management: Minimal or no pain anticipated  ? ?Induction: Intravenous ? ?PONV Risk Score and Plan: TIVA ? ?Airway Management Planned: Nasal Cannula and Natural Airway ? ?Additional Equipment:  ? ?Intra-op Plan:  ? ?Post-operative Plan:  ? ?Informed Consent: I have reviewed the patients History and Physical, chart, labs and discussed the procedure including the risks, benefits and alternatives for the proposed anesthesia with the patient or authorized representative who has indicated his/her understanding and acceptance.  ? ? ? ?Dental advisory given ? ?Plan Discussed with: CRNA and Surgeon ? ?Anesthesia Plan Comments:   ? ? ? ? ? ? ?Anesthesia Quick Evaluation ? ?

## 2021-12-10 LAB — SURGICAL PATHOLOGY

## 2021-12-12 ENCOUNTER — Encounter (HOSPITAL_COMMUNITY): Payer: Self-pay | Admitting: Internal Medicine

## 2022-01-23 ENCOUNTER — Ambulatory Visit: Admitting: Gastroenterology

## 2022-03-28 ENCOUNTER — Ambulatory Visit (INDEPENDENT_AMBULATORY_CARE_PROVIDER_SITE_OTHER): Payer: Medicare Other | Admitting: Family Medicine

## 2022-03-28 ENCOUNTER — Encounter: Payer: Self-pay | Admitting: Family Medicine

## 2022-03-28 VITALS — BP 156/83 | HR 74 | Temp 97.2°F | Ht 67.0 in | Wt 207.8 lb

## 2022-03-28 DIAGNOSIS — E1165 Type 2 diabetes mellitus with hyperglycemia: Secondary | ICD-10-CM

## 2022-03-28 DIAGNOSIS — E782 Mixed hyperlipidemia: Secondary | ICD-10-CM

## 2022-03-28 DIAGNOSIS — E669 Obesity, unspecified: Secondary | ICD-10-CM | POA: Diagnosis not present

## 2022-03-28 DIAGNOSIS — I10 Essential (primary) hypertension: Secondary | ICD-10-CM

## 2022-03-28 DIAGNOSIS — Z0000 Encounter for general adult medical examination without abnormal findings: Secondary | ICD-10-CM

## 2022-03-28 LAB — BAYER DCA HB A1C WAIVED: HB A1C (BAYER DCA - WAIVED): 7.8 % — ABNORMAL HIGH (ref 4.8–5.6)

## 2022-03-28 NOTE — Patient Instructions (Signed)
Please schedule your eye exam.

## 2022-03-29 ENCOUNTER — Encounter: Payer: Self-pay | Admitting: Family Medicine

## 2022-03-29 LAB — CBC WITH DIFFERENTIAL/PLATELET
Basophils Absolute: 0 10*3/uL (ref 0.0–0.2)
Basos: 1 %
EOS (ABSOLUTE): 0.2 10*3/uL (ref 0.0–0.4)
Eos: 4 %
Hematocrit: 43.1 % (ref 37.5–51.0)
Hemoglobin: 15 g/dL (ref 13.0–17.7)
Immature Grans (Abs): 0 10*3/uL (ref 0.0–0.1)
Immature Granulocytes: 1 %
Lymphocytes Absolute: 1.8 10*3/uL (ref 0.7–3.1)
Lymphs: 29 %
MCH: 29.9 pg (ref 26.6–33.0)
MCHC: 34.8 g/dL (ref 31.5–35.7)
MCV: 86 fL (ref 79–97)
Monocytes Absolute: 0.6 10*3/uL (ref 0.1–0.9)
Monocytes: 9 %
Neutrophils Absolute: 3.5 10*3/uL (ref 1.4–7.0)
Neutrophils: 56 %
Platelets: 190 10*3/uL (ref 150–450)
RBC: 5.02 x10E6/uL (ref 4.14–5.80)
RDW: 13.1 % (ref 11.6–15.4)
WBC: 6.2 10*3/uL (ref 3.4–10.8)

## 2022-03-29 LAB — CMP14+EGFR
ALT: 52 IU/L — ABNORMAL HIGH (ref 0–44)
AST: 31 IU/L (ref 0–40)
Albumin/Globulin Ratio: 2.3 — ABNORMAL HIGH (ref 1.2–2.2)
Albumin: 4.3 g/dL (ref 3.8–4.8)
Alkaline Phosphatase: 69 IU/L (ref 44–121)
BUN/Creatinine Ratio: 13 (ref 10–24)
BUN: 15 mg/dL (ref 8–27)
Bilirubin Total: 0.5 mg/dL (ref 0.0–1.2)
CO2: 24 mmol/L (ref 20–29)
Calcium: 9.2 mg/dL (ref 8.6–10.2)
Chloride: 103 mmol/L (ref 96–106)
Creatinine, Ser: 1.19 mg/dL (ref 0.76–1.27)
Globulin, Total: 1.9 g/dL (ref 1.5–4.5)
Glucose: 141 mg/dL — ABNORMAL HIGH (ref 70–99)
Potassium: 4.6 mmol/L (ref 3.5–5.2)
Sodium: 138 mmol/L (ref 134–144)
Total Protein: 6.2 g/dL (ref 6.0–8.5)
eGFR: 68 mL/min/{1.73_m2} (ref >59)

## 2022-03-29 LAB — LIPID PANEL
Chol/HDL Ratio: 6 ratio — ABNORMAL HIGH (ref 0.0–5.0)
Cholesterol, Total: 157 mg/dL (ref 100–199)
HDL: 26 mg/dL — ABNORMAL LOW (ref >39)
LDL Chol Calc (NIH): 109 mg/dL — ABNORMAL HIGH (ref 0–99)
Triglycerides: 122 mg/dL (ref 0–149)
VLDL Cholesterol Cal: 22 mg/dL (ref 5–40)

## 2022-05-08 ENCOUNTER — Encounter: Payer: Self-pay | Admitting: Family Medicine

## 2022-05-08 ENCOUNTER — Other Ambulatory Visit: Payer: Self-pay | Admitting: Family Medicine

## 2022-05-08 DIAGNOSIS — I10 Essential (primary) hypertension: Secondary | ICD-10-CM

## 2022-05-08 MED ORDER — LISINOPRIL 10 MG PO TABS: 10.0000 mg | ORAL_TABLET | Freq: Every day | ORAL | 1 refills | Status: DC

## 2022-05-08 NOTE — Telephone Encounter (Signed)
Please reach out to patient. Normally the [facility] fills his medications. If he wants me to fill, I will, but I am increasing the dosage to 10 mg as he has been uncontrolled.

## 2022-09-17 ENCOUNTER — Ambulatory Visit: Payer: Medicare Other | Admitting: Nurse Practitioner

## 2022-10-01 ENCOUNTER — Encounter: Payer: Self-pay | Admitting: Nurse Practitioner

## 2022-10-01 ENCOUNTER — Ambulatory Visit (INDEPENDENT_AMBULATORY_CARE_PROVIDER_SITE_OTHER): Payer: Medicare Other | Admitting: Nurse Practitioner

## 2022-10-01 VITALS — BP 153/81 | HR 73 | Temp 98.3°F | Ht 67.0 in | Wt 203.0 lb

## 2022-10-01 DIAGNOSIS — E1165 Type 2 diabetes mellitus with hyperglycemia: Secondary | ICD-10-CM | POA: Diagnosis not present

## 2022-10-01 DIAGNOSIS — I10 Essential (primary) hypertension: Secondary | ICD-10-CM

## 2022-10-01 LAB — CBC WITH DIFFERENTIAL/PLATELET
Basophils Absolute: 0.1 10*3/uL (ref 0.0–0.2)
Basos: 1 %
EOS (ABSOLUTE): 0.2 10*3/uL (ref 0.0–0.4)
Eos: 3 %
Hematocrit: 44.2 % (ref 37.5–51.0)
Hemoglobin: 15 g/dL (ref 13.0–17.7)
Immature Grans (Abs): 0 10*3/uL (ref 0.0–0.1)
Immature Granulocytes: 0 %
Lymphocytes Absolute: 2.1 10*3/uL (ref 0.7–3.1)
Lymphs: 28 %
MCH: 29.1 pg (ref 26.6–33.0)
MCHC: 33.9 g/dL (ref 31.5–35.7)
MCV: 86 fL (ref 79–97)
Monocytes Absolute: 0.6 10*3/uL (ref 0.1–0.9)
Monocytes: 7 %
Neutrophils Absolute: 4.6 10*3/uL (ref 1.4–7.0)
Neutrophils: 61 %
Platelets: 219 10*3/uL (ref 150–450)
RBC: 5.16 x10E6/uL (ref 4.14–5.80)
RDW: 12 % (ref 11.6–15.4)
WBC: 7.5 10*3/uL (ref 3.4–10.8)

## 2022-10-01 LAB — LIPID PANEL
Chol/HDL Ratio: 5.7 ratio — ABNORMAL HIGH (ref 0.0–5.0)
Cholesterol, Total: 172 mg/dL (ref 100–199)
HDL: 30 mg/dL — ABNORMAL LOW (ref >39)
LDL Chol Calc (NIH): 110 mg/dL — ABNORMAL HIGH (ref 0–99)
Triglycerides: 180 mg/dL — ABNORMAL HIGH (ref 0–149)
VLDL Cholesterol Cal: 32 mg/dL (ref 5–40)

## 2022-10-01 LAB — CMP14+EGFR
ALT: 46 IU/L — ABNORMAL HIGH (ref 0–44)
AST: 27 IU/L (ref 0–40)
Albumin/Globulin Ratio: 2 (ref 1.2–2.2)
Albumin: 4.5 g/dL (ref 3.9–4.9)
Alkaline Phosphatase: 83 IU/L (ref 44–121)
BUN/Creatinine Ratio: 10 (ref 10–24)
BUN: 14 mg/dL (ref 8–27)
Bilirubin Total: 0.5 mg/dL (ref 0.0–1.2)
CO2: 26 mmol/L (ref 20–29)
Calcium: 9.9 mg/dL (ref 8.6–10.2)
Chloride: 100 mmol/L (ref 96–106)
Creatinine, Ser: 1.36 mg/dL — ABNORMAL HIGH (ref 0.76–1.27)
Globulin, Total: 2.2 g/dL (ref 1.5–4.5)
Glucose: 178 mg/dL — ABNORMAL HIGH (ref 70–99)
Potassium: 4.7 mmol/L (ref 3.5–5.2)
Sodium: 139 mmol/L (ref 134–144)
Total Protein: 6.7 g/dL (ref 6.0–8.5)
eGFR: 58 mL/min/{1.73_m2} — ABNORMAL LOW (ref >59)

## 2022-10-01 LAB — BAYER DCA HB A1C WAIVED: HB A1C (BAYER DCA - WAIVED): 7.5 % — ABNORMAL HIGH (ref 4.8–5.6)

## 2022-10-01 NOTE — Patient Instructions (Signed)
Diabetes Insipidus Diabetes insipidus (DI) is a rare condition that causes the body to produce more urine than normal. This leads to thirst and low fluid in the body (dehydration). In this condition, the urine is made mostly of water, or dilute urine. DI affects mostly adults, but it can happen at any age. There are four types of DI: Central DI. This is the most common type. Dipsogenic DI. Nephrogenic DI. Gestational DI. The most common forms of this condition are caused by a decrease in the production of the hormone that regulates urine output (antidiuretic hormone), or the body's resistance to this hormone. This condition is not related to type 1 or type 2 diabetes mellitus. What are the causes? Central DI is caused by damage to the pituitary gland or hypothalamus in the brain. Dipsogenic DI is caused by a defect in the thirst mechanism in the brain. This defect causes you to drink too much fluid. These may result from: Brain surgery. Infection. Inflammation. Brain tumor. Head injury. Nephrogenic DI is caused by the kidneys not responding to the antidiuretic hormone in the body. This may result from: Chronic kidney disease (CKD). Certain medicines, such as lithium. Low potassium levels. High calcium levels. Gestational DI is rare and is caused by the antidiuretic hormone that has stopped working properly. What are the signs or symptoms? Symptoms of this condition include: Excessive urination. This means urinating more than 10 cups (2.4 L) during a period of 24 hours. Excessive thirst. Too much nighttime urination (nocturia). Nausea. Diarrhea. How is this diagnosed? This condition may be diagnosed based on: Your medical history. A physical exam. Blood tests. Urine tests. A water deprivation test. During this test, you will stop drinking fluids for a period of time and your blood and urine will be checked regularly. An MRI. How is this treated? Once your specific type of  diabetes insipidus is diagnosed, treatment may include one or more of the following: Increasing or limiting your fluid intake. Taking medicines that contain artificial (synthetic) versions of the antidiuretic hormone. Stopping certain medicines that you take. Correcting the balance of minerals (electrolytes) in your body. Changing your diet. You may be put on a low-protein and low-sodium diet. You may need to visit your health care provider regularly to make sure your condition is being treated properly. You may also need to work with providers who specialize in: Kidney problems (nephrologist). Hormone disorders (endocrinologist). Follow these instructions at home:  Eating and drinking Follow instructions from your health care provider about how much fluid and water to drink. You may be directed to drink more fluids and water, or to limit how much fluid and water you drink. Follow instructions from your health care provider about eating or drinking restrictions. General instructions Take over-the-counter and prescription medicines only as told by your health care provider. If directed, monitor your risk of dehydration in extreme heat. Carry a medical alert card or wear medical alert jewelry. Keep all follow-up visits as told by your health care provider. This is important. You may need to visit your health care provider regularly to make sure your condition is being treated properly. Contact a health care provider if: You continue to have symptoms after treatment. Get help right away if: You have extreme thirst. You have symptoms of severe dehydration, such as rapid heart rate, muscle cramps, or confusion. Summary Diabetes insipidus (DI) is a rare condition that causes the body to produce more urine than normal, which leads to thirst and dehydration. Follow instructions   from your health care provider about eating or drinking restrictions. Treatment may include increasing or limiting your  fluid intake and correcting the balance of minerals (electrolytes) in your body. Get help right away if you have symptoms of severe dehydration, such as rapid heart rate, muscle cramps, or confusion. This information is not intended to replace advice given to you by your health care provider. Make sure you discuss any questions you have with your health care provider. Document Revised: 02/26/2022 Document Reviewed: 10/27/2019 Elsevier Patient Education  2023 Elsevier Inc.  

## 2022-10-01 NOTE — Assessment & Plan Note (Addendum)
Patient's blood pressure is not well-controlled.  Patient reports that this holiday season has been a little stressful and the reason reason for elevated blood pressures.  He reports his blood pressure is normally controlled at home and he has had this issue for a long time.  I advised patient to take 1 week blood pressure log, continue on current medication follow-up in 2 to 3 weeks.  If blood pressure is not controlled in 2 weeks I will increase patient's medication and continue to monitor.  I recommend low-sodium diet, exercise and weight loss.

## 2022-10-01 NOTE — Progress Notes (Signed)
New Patient Note  RE: Bryan Shepard. MRN: [id number] DOB: Nov 14, 1955 Date of Office Visit: 10/01/2022  Chief Complaint: Establish Care  History of Present Illness: Diabetes Mellitus Type II, Follow-up  Lab Results  Component Value Date   HGBA1C 7.5 (H) 10/01/2022   HGBA1C 7.8 (H) 03/28/2022   HGBA1C 6.3 (H) 09/26/2021   Wt Readings from Last 3 Encounters:  10/01/22 203 lb (92.1 kg)  03/28/22 207 lb 12.8 oz (94.3 kg)  12/07/21 206 lb (93.4 kg)   Last seen for diabetes 1 years ago.  Management since then includes diet controlled. He reports good compliance with treatment. He is not having side effects.  Symptoms: No fatigue No foot ulcerations  No appetite changes No nausea  No paresthesia of the feet  No polydipsia  No polyuria No visual disturbances   No vomiting     Home blood sugar records:  patient did not bring his blood glucose log  Episodes of hypoglycemia? No    Current insulin regiment: None  Most Recent Eye Exam: last year  Current diet habits: in general, a "healthy" diet    Pertinent Labs: Lab Results  Component Value Date   CHOL 157 03/28/2022   HDL 26 (L) 03/28/2022   LDLCALC 109 (H) 03/28/2022   TRIG 122 03/28/2022   CHOLHDL 6.0 (H) 03/28/2022   Lab Results  Component Value Date   NA 138 03/28/2022   K 4.6 03/28/2022   CREATININE 1.19 03/28/2022   EGFR 68 03/28/2022     Hypertension, follow-up  BP Readings from Last 3 Encounters:  10/01/22 (!) 153/81  03/28/22 (!) 156/83  12/07/21 109/86   Wt Readings from Last 3 Encounters:  10/01/22 203 lb (92.1 kg)  03/28/22 207 lb 12.8 oz (94.3 kg)  12/07/21 206 lb (93.4 kg)     He was last seen for hypertension 6 months ago.  BP at that visit was 109/83. Management since that visit includes lisinopril 10 mg tablet by mouth daily.Marland Kitchen  He reports good compliance with treatment. He is not having side effects.  He is following a Regular diet. He is exercising as tolerated. He does not  smoke.  Use of agents associated with hypertension: none.   Outside blood pressures are; patient did not bring a blood pressure log in clinic today. Symptoms: No chest pain No chest pressure  No palpitations No syncope  No dyspnea No orthopnea  No paroxysmal nocturnal dyspnea No lower extremity edema   Pertinent labs Lab Results  Component Value Date   CHOL 157 03/28/2022   HDL 26 (L) 03/28/2022   LDLCALC 109 (H) 03/28/2022   TRIG 122 03/28/2022   CHOLHDL 6.0 (H) 03/28/2022   Lab Results  Component Value Date   NA 138 03/28/2022   K 4.6 03/28/2022   CREATININE 1.19 03/28/2022   EGFR 68 03/28/2022   GLUCOSE 141 (H) 03/28/2022   TSH 1.440 12/08/2020     The 10-year ASCVD risk score (Arnett DK, et al., 2019) is: 41.1%  ---------------------------------------------------------------------------------------------------   Assessment and Plan: Bryan Shepard is a 66 y.o. male with: Essential hypertension Patient's blood pressure is not well-controlled.  Patient reports that this holiday season has been a little stressful and the reason reason for elevated blood pressures.  He reports his blood pressure is normally controlled at home and he has had this issue for a long time.  I advised patient to take 1 week blood pressure log, continue on current medication follow-up in 2 to 3  weeks.  If blood pressure is not controlled in 2 weeks I will increase patient's medication and continue to monitor.  I recommend low-sodium diet, exercise and weight loss.  Type 2 diabetes mellitus with hyperglycemia, without long-term current use of insulin ([place]) Patient is currently on a diet control for his prediabetic status.  Today he is reporting mild tingling in his feet and hands.  I repeated patient's A1c, completed CBC, CMP, lipid panel and microalbumin.  Provided education to patient that prediabetes or diabetes is good to be well-controlled to prevent future organ damage.  Patient verbalized understanding.   Follow-up in 3 months based on lab results.  Return in about 3 months (around 12/31/2022) for Chronic diseaes management.   Diagnostics:   Past Medical History: Patient Active Problem List   Diagnosis Date Noted   Type 2 diabetes mellitus with hyperglycemia, without long-term current use of insulin ([place]) 12/13/2020   Essential hypertension 04/30/2020   Obesity (BMI 30.0-34.9) 04/30/2020   Family history of genetic mutation for hereditary nonpolyposis colorectal cancer ([facility])    Family history of prostate cancer    Family history of stomach cancer    History of CVA (cerebrovascular accident) 06/26/2015   Hyperlipidemia 06/26/2015   OSA on CPAP 06/26/2015   Past Medical History:  Diagnosis Date   Acid reflux    Colon polyps    Diabetes mellitus type 2 in nonobese ([place]) 12/13/2020   Family history of genetic mutation for hereditary nonpolyposis colorectal cancer ([facility])    Family history of prostate cancer    Family history of stomach cancer    HOH (hard of hearing)    Hypertension    Sleep apnea    CPAP   Stroke ([place]) 2015   TIA (transient ischemic attack) 2016   Past Surgical History: Past Surgical History:  Procedure Laterality Date   CHOLECYSTECTOMY N/A 04/17/2020   Procedure: LAPAROSCOPIC CHOLECYSTECTOMY;  Surgeon: [name], MD;  Location: [facility] ORS;  Service: General;  Laterality: N/A;   COLONOSCOPY  2018   COLONOSCOPY WITH PROPOFOL N/A 12/07/2021   Procedure: COLONOSCOPY WITH PROPOFOL;  Surgeon: [name], DO;  Location: [facility] ENDO SUITE;  Service: Endoscopy;  Laterality: N/A;  9:45am   POLYPECTOMY  12/07/2021   Procedure: POLYPECTOMY;  Surgeon: [name], DO;  Location: [facility];  Service: Endoscopy;;   Medication List:  Current Outpatient Medications  Medication Sig Dispense Refill   Chromium Picolinate 1000 MCG TABS Take 1,000 mcg by mouth daily.     KRILL OIL PO 4,000 mcg daily.     lisinopril (ZESTRIL) 10 MG tablet Take 1 tablet (10 mg  total) by mouth daily. 90 tablet 1   MORINGA OLEIFERA PO Take 6 capsules by mouth daily. 326m     OVER THE COUNTER MEDICATION Take 2 capsules by mouth daily. sucontral d     polyethylene glycol-electrolytes (NULYTELY) 420 g solution As directed 4000 mL 0   Turmeric Curcumin 500 MG CAPS Take 500 mg by mouth daily.     Vitamin D-Vitamin K (VITAMIN K2-VITAMIN D3 PO) Take 1 tablet by mouth daily. 921m and 12512m    zinc gluconate 50 MG tablet Take 50 mg by mouth daily.     No current facility-administered medications for this visit.   Allergies: No Known Allergies Social History: Social History   Socioeconomic History   Marital status: Married    Spouse name: VirVermontNumber of children: Not on file   Years of education: Not  on file   Highest education level: Not on file  Occupational History   Occupation: retired     Comment: [employer]   Tobacco Use   Smoking status: Never   Smokeless tobacco: Never  Vaping Use   Vaping Use: Never used  Substance and Sexual Activity   Alcohol use: No   Drug use: No   Sexual activity: Yes    Birth control/protection: None  Other Topics Concern   Not on file  Social History Narrative   Married , 1 step daughter    Social Determinants of Health   Financial Resource Strain: Not on file  Food Insecurity: Not on file  Transportation Needs: Not on file  Physical Activity: Not on file  Stress: Not on file  Social Connections: Not on file       Family History: Family History  Problem Relation Age of Onset   Melanoma Mother 20   Hypertension Mother    Transient ischemic attack Mother    Stroke Mother    Heart disease Mother    Prostate cancer Father 100   Stomach cancer Father 30       PMS2+; Lynch syndrome   Melanoma Father    Diabetes Father    Heart disease Father    Stroke Maternal Grandmother    Lung cancer Paternal Grandmother    Throat cancer Paternal Grandmother    Prostate cancer Paternal Grandfather 51   Bladder  Cancer Other        MGM's mother with urethra cancer         Review of Systems  Constitutional: Negative.   HENT: Negative.    Eyes: Negative.   Respiratory: Negative.    Cardiovascular: Negative.   Gastrointestinal: Negative.   Genitourinary: Negative.   Musculoskeletal: Negative.  Negative for back pain, gait problem and joint swelling.  Skin: Negative.  Negative for rash.  Psychiatric/Behavioral: Negative.  The patient is not nervous/anxious.   All other systems reviewed and are negative.  Objective: BP (!) 153/81   Pulse 73   Temp 98.3 F (36.8 C)   Ht 5' 7" (1.702 m)   Wt 203 lb (92.1 kg)   SpO2 94%   BMI 31.79 kg/m  Body mass index is 31.79 kg/m. Physical Exam Vitals and nursing note reviewed.  Constitutional:      Appearance: Normal appearance. He is obese.  HENT:     Head: Normocephalic.     Comments: HX CVA    Right Ear: External ear normal.     Left Ear: External ear normal.     Nose: Nose normal.  Eyes:     Conjunctiva/sclera: Conjunctivae normal.  Cardiovascular:     Rate and Rhythm: Normal rate and regular rhythm.  Pulmonary:     Effort: Pulmonary effort is normal.     Breath sounds: Normal breath sounds.  Abdominal:     General: Bowel sounds are normal.  Skin:    General: Skin is warm.     Findings: No erythema or rash.  Neurological:     Mental Status: He is alert and oriented to person, place, and time.  Psychiatric:        Mood and Affect: Mood normal.        Behavior: Behavior normal.    The plan was reviewed with the patient/family, and all questions/concerned were addressed.  It was my pleasure to see Ziair today and participate in his care. Please feel free to contact me with any questions or concerns.  Sincerely,  Jac Canavan NP [place]

## 2022-10-01 NOTE — Assessment & Plan Note (Signed)
Patient is currently on a diet control for his prediabetic status.  Today he is reporting mild tingling in his feet and hands.  I repeated patient's A1c, completed CBC, CMP, lipid panel and microalbumin.  Provided education to patient that prediabetes or diabetes is good to be well-controlled to prevent future organ damage.  Patient verbalized understanding.  Follow-up in 3 months based on lab results.

## 2022-10-02 LAB — MICROALBUMIN / CREATININE URINE RATIO
Creatinine, Urine: 245.4 mg/dL
Microalb/Creat Ratio: 5 mg/g creat (ref 0–29)
Microalbumin, Urine: 13.2 ug/mL

## 2022-11-14 ENCOUNTER — Ambulatory Visit: Payer: Medicare Other | Admitting: Family Medicine

## 2022-11-18 ENCOUNTER — Ambulatory Visit (INDEPENDENT_AMBULATORY_CARE_PROVIDER_SITE_OTHER): Payer: Medicare Other | Admitting: Family

## 2022-11-18 ENCOUNTER — Encounter: Payer: Self-pay | Admitting: Family

## 2022-11-18 VITALS — BP 140/84 | HR 74 | Temp 97.9°F | Ht 67.0 in | Wt 206.0 lb

## 2022-11-18 DIAGNOSIS — L989 Disorder of the skin and subcutaneous tissue, unspecified: Secondary | ICD-10-CM | POA: Diagnosis not present

## 2022-11-18 DIAGNOSIS — D1801 Hemangioma of skin and subcutaneous tissue: Secondary | ICD-10-CM | POA: Diagnosis not present

## 2022-11-18 NOTE — Progress Notes (Signed)
   Subjective:    Patient ID: Bryan Shepard., male    DOB: 1956-04-04, 67 y.o.   MRN: [id number]  Chief Complaint  Patient presents with   discolored place on head.    HPI PT presents to the office today with discoloration of left scalp that he and his wife noticed last week. Denies any pain, trauma, erythemas, or discharge. Denies any changes to lotions, shampoos, or detergents.   He reports he noticed a nevus there several years ago and is unchanged. However, noted around that some yellow discoloration.   His father has had a hx of skin cancer and wants referral to dermatologists.   Review of Systems  All other systems reviewed and are negative.      Objective:   Physical Exam Vitals reviewed.  Constitutional:      General: He is not in acute distress.    Appearance: He is well-developed.  HENT:     Head: Normocephalic.  Eyes:     General:        Right eye: No discharge.        Left eye: No discharge.     Pupils: Pupils are equal, round, and reactive to light.  Neck:     Thyroid: No thyromegaly.  Cardiovascular:     Rate and Rhythm: Normal rate and regular rhythm.     Heart sounds: Normal heart sounds. No murmur heard. Pulmonary:     Effort: Pulmonary effort is normal. No respiratory distress.     Breath sounds: Normal breath sounds. No wheezing.  Abdominal:     General: Bowel sounds are normal. There is no distension.     Palpations: Abdomen is soft.     Tenderness: There is no abdominal tenderness.  Musculoskeletal:        General: No tenderness. Normal range of motion.     Cervical back: Normal range of motion and neck supple.  Skin:    General: Skin is warm and dry.     Findings: No erythema or rash.          Comments: Small cherry angioma  Neurological:     Mental Status: He is alert and oriented to person, place, and time.     Cranial Nerves: No cranial nerve deficit.     Deep Tendon Reflexes: Reflexes are normal and symmetric.  Psychiatric:         Behavior: Behavior normal.        Thought Content: Thought content normal.        Judgment: Judgment normal.       BP (!) 140/84   Pulse 74   Temp 97.9 F (36.6 C) (Temporal)   Ht '5\' 7"'$  (1.702 m)   Wt 206 lb (93.4 kg)   SpO2 100%   BMI 32.26 kg/m      Assessment & Plan:  Bryan Shepard. comes in today with chief complaint of discolored place on head.   Diagnosis and orders addressed:  1. Skin lesion - Ambulatory referral to Dermatology  2. Cherry angioma - Ambulatory referral to Dermatology   Referral to Dermatologists  Avoid picking lesion  Keep follow up with PCP to establish care   Evelina Dun, FNP

## 2022-11-18 NOTE — Patient Instructions (Signed)
Cherry Angioma A cherry angioma, also called a Santina Evans spot, is a harmless growth on the skin. It is made up of blood vessels. Cherry angiomas can appear anywhere on the body, but they usually appear on the trunk and arms. What are the causes? The cause of this condition is not known, but it seems to be related to advancing age. What increases the risk? You are more likely to develop this condition if: You are over the age of 25. You have a family member with this condition. What are the signs or symptoms? Symptoms of this condition include harmless growths that are: Smooth, round, and red or purplish-red. As small as the tip of a pin or as big as a pencil eraser. How is this diagnosed? This condition is diagnosed with a skin exam. Rarely, a piece of the cherry angioma may be removed for testing if it is not clear that the growth is a cherry angioma. How is this treated? Treatment is not needed for this condition. If you do not like the way a cherry angioma looks, you may have it removed. Removal methods include: A method where heat is used to burn the cherry angioma off the skin (electrocautery). A method where the cherry angioma is frozen (cryosurgery). This causes it to eventually fall off the skin. A method where a laser is used to destroy the red blood cells and blood vessels in the angioma (laser therapy). A minor surgical procedure. A scalpel is used to remove the cherry angioma off the skin. A cherry angioma may come back after it has been removed. Follow these instructions at home: If you have a cherry angioma removed, keep the area clean and follow any other care instructions as told by your health care provider. Take over-the-counter and prescription medicines only as told by your health care provider. Keep all follow-up visits. This is important. Summary A cherry angioma is a harmless growth on the skin that is made up of blood vessels. Treatment is not needed for this  condition. If you do not like the way a cherry angioma looks, you may have it removed. If you have a cherry angioma removed, follow any care instructions as told by your health care provider. This information is not intended to replace advice given to you by your health care provider. Make sure you discuss any questions you have with your health care provider. Document Revised: 06/14/2021 Document Reviewed: 06/14/2021 Elsevier Patient Education  [place].

## 2022-12-09 ENCOUNTER — Telehealth: Payer: Self-pay | Admitting: Family Medicine

## 2023-01-01 ENCOUNTER — Encounter: Payer: Self-pay | Admitting: Family Medicine

## 2023-01-01 ENCOUNTER — Telehealth: Payer: Self-pay | Admitting: Family Medicine

## 2023-01-01 ENCOUNTER — Ambulatory Visit (INDEPENDENT_AMBULATORY_CARE_PROVIDER_SITE_OTHER): Payer: Medicare Other | Admitting: Family Medicine

## 2023-01-01 VITALS — BP 154/78 | HR 67 | Temp 97.3°F | Ht 67.0 in | Wt 205.0 lb

## 2023-01-01 DIAGNOSIS — R351 Nocturia: Secondary | ICD-10-CM

## 2023-01-01 DIAGNOSIS — E1122 Type 2 diabetes mellitus with diabetic chronic kidney disease: Secondary | ICD-10-CM

## 2023-01-01 DIAGNOSIS — Z125 Encounter for screening for malignant neoplasm of prostate: Secondary | ICD-10-CM

## 2023-01-01 DIAGNOSIS — E1159 Type 2 diabetes mellitus with other circulatory complications: Secondary | ICD-10-CM | POA: Diagnosis not present

## 2023-01-01 DIAGNOSIS — E1169 Type 2 diabetes mellitus with other specified complication: Secondary | ICD-10-CM

## 2023-01-01 DIAGNOSIS — Z8042 Family history of malignant neoplasm of prostate: Secondary | ICD-10-CM

## 2023-01-01 DIAGNOSIS — E785 Hyperlipidemia, unspecified: Secondary | ICD-10-CM

## 2023-01-01 DIAGNOSIS — N183 Chronic kidney disease, stage 3 unspecified: Secondary | ICD-10-CM

## 2023-01-01 DIAGNOSIS — I152 Hypertension secondary to endocrine disorders: Secondary | ICD-10-CM

## 2023-01-01 LAB — BAYER DCA HB A1C WAIVED: HB A1C (BAYER DCA - WAIVED): 7.7 % — ABNORMAL HIGH (ref 4.8–5.6)

## 2023-01-01 MED ORDER — LISINOPRIL 10 MG PO TABS: 10.0000 mg | ORAL_TABLET | Freq: Every day | ORAL | 2 refills | Status: DC

## 2023-01-01 MED ORDER — METFORMIN HCL 500 MG PO TABS: 500.0000 mg | ORAL_TABLET | Freq: Two times a day (BID) | ORAL | 3 refills | Status: DC

## 2023-01-01 NOTE — Patient Instructions (Signed)

## 2023-01-01 NOTE — Progress Notes (Signed)
Subjective:  Patient ID: Bryan Shepard., male    DOB: 02/23/1956, 67 y.o.   MRN: [id number]  Patient Care Team: Baruch Gouty, FNP as PCP - General (Family Medicine) Rigoberto Noel, MD as Consulting Physician (Pulmonary Disease) Harlen Labs, MD as Referring Physician (Optometry)   Chief Complaint:  Establish Care (Former [name] patient ) and Medical Management of Chronic Issues (3 month chronic follow up )   HPI: Bryan Shepard. is a 67 y.o. male presenting on 01/01/2023 for [name] (Former [name] patient ) and Medical Management of Chronic Issues (3 month chronic follow up )   1. Type 2 diabetes mellitus with other specified complication, without long-term current use of insulin (HCC) A1C has been above 7 for at least 1 year. Pt has not started medications as he has been reluctant to. He does try to watch his diet but does not exercise on a regular basis. No polyuria, polyphagia, or polydipsia reported.   2. Hypertension associated with type 2 diabetes mellitus ([place]) He is on lisinopril and reports he is taking this as prescribed. He does not check his blood pressure at home. Denies chest pain, headaches, visual changes, leg swelling, palpitations, weakness, confusion, or syncope.   3. CKD stage 3 due to type 2 diabetes mellitus ([place]) He is on ACEi but not on medications for blood sugar control. Denies changes in urine output. No swelling, weakness, confusion, or fatigue.   4. Hyperlipidemia associated with type 2 diabetes mellitus ([place]) Not on statin therapy as he does not wish to be on medications. Does try to watch diet but does not exercise on a regular basis.   5. Nocturia Reports having to get up to urinate at least once per night, sometimes twice. No other associated urinary symptoms.      Relevant past medical, surgical, family, and social history reviewed and updated as indicated.  Allergies and medications reviewed and updated. Data reviewed: Chart  in Epic.   Past Medical History:  Diagnosis Date   Acid reflux    Colon polyps    Diabetes mellitus type 2 in nonobese ([place]) 12/13/2020   Family history of genetic mutation for hereditary nonpolyposis colorectal cancer ([facility])    Family history of prostate cancer    Family history of stomach cancer    HOH (hard of hearing)    Hypertension    Sleep apnea    CPAP   Stroke ([place]) 2015   TIA (transient ischemic attack) 2016    Past Surgical History:  Procedure Laterality Date   CHOLECYSTECTOMY N/A 04/17/2020   Procedure: LAPAROSCOPIC CHOLECYSTECTOMY;  Surgeon: [name], MD;  Location: [facility];  Service: General;  Laterality: N/A;   COLONOSCOPY  2018   COLONOSCOPY WITH PROPOFOL N/A 12/07/2021   Procedure: COLONOSCOPY WITH PROPOFOL;  Surgeon: [name], DO;  Location: [facility];  Service: Endoscopy;  Laterality: N/A;  9:45am   POLYPECTOMY  12/07/2021   Procedure: POLYPECTOMY;  Surgeon: [name], DO;  Location: [facility];  Service: Endoscopy;;    Social History   Socioeconomic History   Marital status: Married    Spouse name: [name]   Number of children: Not on file   Years of education: Not on file   Highest education level: Not on file  Occupational History   Occupation: retired     Comment: [employer]   Tobacco Use   Smoking status: Never   Smokeless tobacco: Never  Vaping Use   Vaping Use: Never used  Substance and Sexual Activity   Alcohol use: No   Drug use: No   Sexual activity: Yes    Birth control/protection: None  Other Topics Concern   Not on file  Social History Narrative   Married , 1 step daughter    Social Determinants of Health   Financial Resource Strain: Not on file  Food Insecurity: Not on file  Transportation Needs: Not on file  Physical Activity: Not on file  Stress: Not on file  Social Connections: Not on file  Intimate Partner Violence: Not on file    Outpatient Encounter Medications as of 01/01/2023  Medication Sig    lactobacillus acidophilus (BACID) TABS tablet Take 1 tablet by mouth daily at 2 PM.   metFORMIN (GLUCOPHAGE) 500 MG tablet Take 1 tablet (500 mg total) by mouth 2 (two) times daily with a meal.   OVER THE COUNTER MEDICATION Take 2 capsules by mouth daily. sucontral d   [DISCONTINUED] lisinopril (ZESTRIL) 10 MG tablet Take 1 tablet (10 mg total) by mouth daily.   lisinopril (ZESTRIL) 10 MG tablet Take 1 tablet (10 mg total) by mouth daily.   [DISCONTINUED] Chromium Picolinate 1000 MCG TABS Take 1,000 mcg by mouth daily.   [DISCONTINUED] polyethylene glycol-electrolytes (NULYTELY) 420 g solution As directed (Patient not taking: Reported on 11/18/2022)   [DISCONTINUED] Vitamin D-Vitamin K (VITAMIN K2-VITAMIN D3 PO) Take 1 tablet by mouth daily. 81mcg and 172mcg   No facility-administered encounter medications on file as of 01/01/2023.    No Known Allergies  Review of Systems  Constitutional:  Negative for activity change, appetite change, chills, diaphoresis, fatigue, fever and unexpected weight change.  HENT: Negative.    Eyes: Negative.  Negative for photophobia and visual disturbance.  Respiratory:  Negative for cough, chest tightness and shortness of breath.   Cardiovascular:  Negative for chest pain, palpitations and leg swelling.  Gastrointestinal:  Negative for abdominal pain, blood in stool, constipation, diarrhea, nausea and vomiting.  Endocrine: Negative.  Negative for polydipsia, polyphagia and polyuria.  Genitourinary:  Negative for decreased urine volume, difficulty urinating, dysuria, frequency and urgency.       Nocturia  Musculoskeletal:  Negative for arthralgias and myalgias.  Skin: Negative.   Allergic/Immunologic: Negative.   Neurological:  Negative for dizziness, tremors, seizures, syncope, facial asymmetry, speech difficulty, weakness, light-headedness, numbness and headaches.  Hematological: Negative.   Psychiatric/Behavioral:  Negative for confusion, hallucinations,  sleep disturbance and suicidal ideas.   All other systems reviewed and are negative.       Objective:  BP (!) 154/78   Pulse 67   Temp (!) 97.3 F (36.3 C) (Temporal)   Ht 5\' 7"  (1.702 m)   Wt 205 lb (93 kg)   SpO2 99%   BMI 32.11 kg/m    Wt Readings from Last 3 Encounters:  01/01/23 205 lb (93 kg)  11/18/22 206 lb (93.4 kg)  10/01/22 203 lb (92.1 kg)    Physical Exam Vitals and nursing note reviewed.  Constitutional:      General: He is not in acute distress.    Appearance: Normal appearance. He is well-developed and well-groomed. He is obese. He is not ill-appearing, toxic-appearing or diaphoretic.  HENT:     Head: Normocephalic and atraumatic.     Jaw: There is normal jaw occlusion.     Right Ear: Hearing normal.     Left Ear: Hearing normal.     Nose: Nose normal.  Mouth/Throat:     Lips: Pink.     Mouth: Mucous membranes are moist.     Pharynx: Oropharynx is clear. Uvula midline.  Eyes:     General: Lids are normal.     Extraocular Movements: Extraocular movements intact.     Conjunctiva/sclera: Conjunctivae normal.     Pupils: Pupils are equal, round, and reactive to light.  Neck:     Thyroid: No thyroid mass, thyromegaly or thyroid tenderness.     Vascular: No carotid bruit or JVD.     Trachea: Trachea and phonation normal.  Cardiovascular:     Rate and Rhythm: Normal rate and regular rhythm.     Chest Wall: PMI is not displaced.     Pulses: Normal pulses.     Heart sounds: Normal heart sounds. No murmur heard.    No friction rub. No gallop.  Pulmonary:     Effort: Pulmonary effort is normal. No respiratory distress.     Breath sounds: Normal breath sounds. No wheezing.  Abdominal:     General: Bowel sounds are normal. There is no distension or abdominal bruit.     Palpations: Abdomen is soft. There is no hepatomegaly or splenomegaly.     Tenderness: There is no abdominal tenderness. There is no right CVA tenderness or left CVA tenderness.      Hernia: No hernia is present.  Musculoskeletal:        General: Normal range of motion.     Cervical back: Normal range of motion and neck supple.     Right lower leg: No edema.     Left lower leg: No edema.  Lymphadenopathy:     Cervical: No cervical adenopathy.  Skin:    General: Skin is warm and dry.     Capillary Refill: Capillary refill takes less than 2 seconds.     Coloration: Skin is not cyanotic, jaundiced or pale.     Findings: No rash.  Neurological:     General: No focal deficit present.     Mental Status: He is alert and oriented to person, place, and time.     Sensory: Sensation is intact.     Motor: Motor function is intact.     Coordination: Coordination is intact.     Gait: Gait is intact.     Deep Tendon Reflexes: Reflexes are normal and symmetric.  Psychiatric:        Attention and Perception: Attention and perception normal.        Mood and Affect: Mood and affect normal.        Speech: Speech normal.        Behavior: Behavior normal. Behavior is cooperative.        Thought Content: Thought content normal.        Cognition and Memory: Cognition and memory normal.        Judgment: Judgment normal.     Results for orders placed or performed in visit on 01/01/23  Bayer DCA Hb A1c Waived  Result Value Ref Range   HB A1C (BAYER DCA - WAIVED) 7.7 (H) 4.8 - 5.6 %       Pertinent labs & imaging results that were available during my care of the patient were reviewed by me and considered in my medical decision making.  Assessment & Plan:  Engel was seen today for establish care and medical management of chronic issues.  Diagnoses and all orders for this visit:  Type 2 diabetes mellitus with other specified complication,  without long-term current use of insulin ([place]) Has not been on medications as he did not want to start. Discussed end organ damage in detail and explained his renal function is declined and that his untreated DM is likely the cause. Pt agrees  to start medication as A1C is still above goal at 7.7. Diet and exercise encouraged. Follow up in 3 months for reevaluation.  -     CBC with Differential/Platelet -     CMP14+EGFR -     Lipid panel -     Thyroid Panel With TSH -     Bayer DCA Hb A1c Waived -     Microalbumin / creatinine urine ratio -     metFORMIN (GLUCOPHAGE) 500 MG tablet; Take 1 tablet (500 mg total) by mouth 2 (two) times daily with a meal.  Hypertension associated with type 2 diabetes mellitus (HCC) BP fairly controlled. Changes were not made in regimen today. Goal BP is 130/80. Pt aware to report any persistent high or low readings. DASH diet and exercise encouraged. Exercise at least 150 minutes per week and increase as tolerated. Goal BMI > 25. Stress management encouraged. Avoid nicotine and tobacco product use. Avoid excessive alcohol and NSAID's. Avoid more than 2000 mg of sodium daily. Medications as prescribed. Follow up as scheduled.  -     lisinopril (ZESTRIL) 10 MG tablet; Take 1 tablet (10 mg total) by mouth daily. -     CBC with Differential/Platelet -     CMP14+EGFR -     Lipid panel -     Thyroid Panel With TSH -     Microalbumin / creatinine urine ratio  CKD stage 3 due to type 2 diabetes mellitus ([place]) On ACEi. Labs pending. Further treatment pending results.  -     CBC with Differential/Platelet -     CMP14+EGFR -     Microalbumin / creatinine urine ratio -     VITAMIN D 25 Hydroxy (Vit-D Deficiency, Fractures)  Hyperlipidemia associated with type 2 diabetes mellitus ([place]) Currently not on statin therapy and does not wish to start. Lipid panel pending. Will discuss medications further once labs result.   Family history of prostate cancer Screening for prostate cancer Nocturia 1-2 episodes of nocturia per night. Will check PSA and refer to urology if warranted.  -     PSA, total and free     Continue all other maintenance medications.  Follow up plan: Return in 3 months (on 04/03/2023), or  if symptoms worsen or fail to improve, for DM, schedule AWV.   Continue healthy lifestyle choices, including diet (rich in fruits, vegetables, and lean proteins, and low in salt and simple carbohydrates) and exercise (at least 30 minutes of moderate physical activity daily).  Educational handout given for DM  The above assessment and management plan was discussed with the patient. The patient verbalized understanding of and has agreed to the management plan. Patient is aware to call the clinic if they develop any new symptoms or if symptoms persist or worsen. Patient is aware when to return to the clinic for a follow-up visit. Patient educated on when it is appropriate to go to the emergency department.   Monia Pouch, FNP-C [facility] [phone]

## 2023-01-02 LAB — CMP14+EGFR
ALT: 29 IU/L (ref 0–44)
AST: 19 IU/L (ref 0–40)
Albumin/Globulin Ratio: 2 (ref 1.2–2.2)
Albumin: 4.3 g/dL (ref 3.9–4.9)
Alkaline Phosphatase: 70 IU/L (ref 44–121)
BUN/Creatinine Ratio: 10 (ref 10–24)
BUN: 14 mg/dL (ref 8–27)
Bilirubin Total: 0.4 mg/dL (ref 0.0–1.2)
CO2: 23 mmol/L (ref 20–29)
Calcium: 9.5 mg/dL (ref 8.6–10.2)
Chloride: 104 mmol/L (ref 96–106)
Creatinine, Ser: 1.34 mg/dL — ABNORMAL HIGH (ref 0.76–1.27)
Globulin, Total: 2.1 g/dL (ref 1.5–4.5)
Glucose: 172 mg/dL — ABNORMAL HIGH (ref 70–99)
Potassium: 4.4 mmol/L (ref 3.5–5.2)
Sodium: 143 mmol/L (ref 134–144)
Total Protein: 6.4 g/dL (ref 6.0–8.5)
eGFR: 59 mL/min/{1.73_m2} — ABNORMAL LOW (ref >59)

## 2023-01-02 LAB — THYROID PANEL WITH TSH
Free Thyroxine Index: 2.1 (ref 1.2–4.9)
T3 Uptake Ratio: 24 % (ref 24–39)
T4, Total: 8.6 ug/dL (ref 4.5–12.0)
TSH: 1.29 u[IU]/mL (ref 0.450–4.500)

## 2023-01-02 LAB — PSA, TOTAL AND FREE
PSA, Free Pct: 42 %
PSA, Free: 0.21 ng/mL
Prostate Specific Ag, Serum: 0.5 ng/mL (ref 0.0–4.0)

## 2023-01-02 LAB — CBC WITH DIFFERENTIAL/PLATELET
Basophils Absolute: 0 10*3/uL (ref 0.0–0.2)
Basos: 1 %
EOS (ABSOLUTE): 0.1 10*3/uL (ref 0.0–0.4)
Eos: 2 %
Hematocrit: 42.3 % (ref 37.5–51.0)
Hemoglobin: 14.1 g/dL (ref 13.0–17.7)
Immature Grans (Abs): 0 10*3/uL (ref 0.0–0.1)
Immature Granulocytes: 0 %
Lymphocytes Absolute: 2.1 10*3/uL (ref 0.7–3.1)
Lymphs: 30 %
MCH: 29.2 pg (ref 26.6–33.0)
MCHC: 33.3 g/dL (ref 31.5–35.7)
MCV: 88 fL (ref 79–97)
Monocytes Absolute: 0.5 10*3/uL (ref 0.1–0.9)
Monocytes: 7 %
Neutrophils Absolute: 4.2 10*3/uL (ref 1.4–7.0)
Neutrophils: 60 %
Platelets: 197 10*3/uL (ref 150–450)
RBC: 4.83 x10E6/uL (ref 4.14–5.80)
RDW: 12.8 % (ref 11.6–15.4)
WBC: 7 10*3/uL (ref 3.4–10.8)

## 2023-01-02 LAB — LIPID PANEL
Chol/HDL Ratio: 5.1 ratio — ABNORMAL HIGH (ref 0.0–5.0)
Cholesterol, Total: 157 mg/dL (ref 100–199)
HDL: 31 mg/dL — ABNORMAL LOW (ref >39)
LDL Chol Calc (NIH): 102 mg/dL — ABNORMAL HIGH (ref 0–99)
Triglycerides: 130 mg/dL (ref 0–149)
VLDL Cholesterol Cal: 24 mg/dL (ref 5–40)

## 2023-01-02 LAB — MICROALBUMIN / CREATININE URINE RATIO
Creatinine, Urine: 171.8 mg/dL
Microalb/Creat Ratio: 6 mg/g creat (ref 0–29)
Microalbumin, Urine: 10.8 ug/mL

## 2023-01-02 LAB — VITAMIN D 25 HYDROXY (VIT D DEFICIENCY, FRACTURES): Vit D, 25-Hydroxy: 35.1 ng/mL (ref 30.0–100.0)

## 2023-01-02 NOTE — Telephone Encounter (Signed)
Spoke with pts wife. States that someone called pt yesterday but the phone was cut off. Pt is at work and will be unable to discuss. Informed wife that Sharyn Lull wants pt to start on Metformin because his A1C is 7.7. Wife states that he is probably not going to take that. She will have him call back on Monday to discuss.

## 2023-03-18 ENCOUNTER — Encounter: Payer: Self-pay | Admitting: Family Medicine

## 2023-03-18 ENCOUNTER — Ambulatory Visit (INDEPENDENT_AMBULATORY_CARE_PROVIDER_SITE_OTHER): Payer: Medicare Other

## 2023-03-18 ENCOUNTER — Ambulatory Visit (INDEPENDENT_AMBULATORY_CARE_PROVIDER_SITE_OTHER): Payer: Medicare Other | Admitting: Family Medicine

## 2023-03-18 VITALS — BP 135/83 | HR 71 | Temp 97.7°F | Ht 67.0 in | Wt 203.2 lb

## 2023-03-18 VITALS — Ht 67.0 in | Wt 203.0 lb

## 2023-03-18 DIAGNOSIS — R197 Diarrhea, unspecified: Secondary | ICD-10-CM

## 2023-03-18 DIAGNOSIS — Z0000 Encounter for general adult medical examination without abnormal findings: Secondary | ICD-10-CM | POA: Diagnosis not present

## 2023-03-18 NOTE — Patient Instructions (Signed)
[name] , Thank you for taking time to come for your Medicare Wellness Visit. I appreciate your ongoing commitment to your health goals. Please review the following plan we discussed and let me know if I can assist you in the future.   These are the goals we discussed:  Goals      Exercise 3x per week (30 min per time)        This is a list of the screening recommended for you and due dates:  Health Maintenance  Topic Date Due   COVID-19 Vaccine (1) Never done   Eye exam for diabetics  01/09/2022   Pneumonia Vaccine (1 of 1 - PCV) 03/29/2023*   Hepatitis C Screening  03/29/2023*   Zoster (Shingles) Vaccine (1 of 2) 04/03/2023*   Complete foot exam   03/29/2023   Flu Shot  05/08/2023   Hemoglobin A1C  07/04/2023   Yearly kidney function blood test for diabetes  01/01/2024   Yearly kidney health urinalysis for diabetes  01/01/2024   Medicare Annual Wellness Visit  03/17/2024   Colon Cancer Screening  12/08/2026   DTaP/Tdap/Td vaccine (3 - Td or Tdap) 08/11/2028   HPV Vaccine  Aged Out  *Topic was postponed. The date shown is not the original due date.    Advanced directives: Please bring a copy of your health care power of attorney and living will to the office to be added to your chart at your convenience.   Conditions/risks identified: Aim for 30 minutes of exercise or brisk walking, 6-8 glasses of water, and 5 servings of fruits and vegetables each day.   Next appointment: Follow up in one year for your annual wellness visit.   Preventive Care 36 Years and Older, Male  Preventive care refers to lifestyle choices and visits with your health care provider that can promote health and wellness. What does preventive care include? A yearly physical exam. This is also called an annual well check. Dental exams once or twice a year. Routine eye exams. Ask your health care provider how often you should have your eyes checked. Personal lifestyle choices, including: Daily care of  your teeth and gums. Regular physical activity. Eating a healthy diet. Avoiding tobacco and drug use. Limiting alcohol use. Practicing safe sex. Taking low doses of aspirin every day. Taking vitamin and mineral supplements as recommended by your health care provider. What happens during an annual well check? The services and screenings done by your health care provider during your annual well check will depend on your age, overall health, lifestyle risk factors, and family history of disease. Counseling  Your health care provider may ask you questions about your: Alcohol use. Tobacco use. Drug use. Emotional well-being. Home and relationship well-being. Sexual activity. Eating habits. History of falls. Memory and ability to understand (cognition). Work and work Astronomer. Screening  You may have the following tests or measurements: Height, weight, and BMI. Blood pressure. Lipid and cholesterol levels. These may be checked every 5 years, or more frequently if you are over 45 years old. Skin check. Lung cancer screening. You may have this screening every year starting at age 64 if you have a 30-pack-year history of smoking and currently smoke or have quit within the past 15 years. Fecal occult blood test (FOBT) of the stool. You may have this test every year starting at age 48. Flexible sigmoidoscopy or colonoscopy. You may have a sigmoidoscopy every 5 years or a colonoscopy every 10 years starting at age 14.  Prostate cancer screening. Recommendations will vary depending on your family history and other risks. Hepatitis C blood test. Hepatitis B blood test. Sexually transmitted disease (STD) testing. Diabetes screening. This is done by checking your blood sugar (glucose) after you have not eaten for a while (fasting). You may have this done every 1-3 years. Abdominal aortic aneurysm (AAA) screening. You may need this if you are a current or former smoker. Osteoporosis. You may be  screened starting at age 66 if you are at high risk. Talk with your health care provider about your test results, treatment options, and if necessary, the need for more tests. Vaccines  Your health care provider may recommend certain vaccines, such as: Influenza vaccine. This is recommended every year. Tetanus, diphtheria, and acellular pertussis (Tdap, Td) vaccine. You may need a Td booster every 10 years. Zoster vaccine. You may need this after age 74. Pneumococcal 13-valent conjugate (PCV13) vaccine. One dose is recommended after age 66. Pneumococcal polysaccharide (PPSV23) vaccine. One dose is recommended after age 66. Talk to your health care provider about which screenings and vaccines you need and how often you need them. This information is not intended to replace advice given to you by your health care provider. Make sure you discuss any questions you have with your health care provider. Document Released: 10/20/2015 Document Revised: 06/12/2016 Document Reviewed: 07/25/2015 Elsevier Interactive Patient Education  2017 [place] Prevention in the Home Falls can cause injuries. They can happen to people of all ages. There are many things you can do to make your home safe and to help prevent falls. What can I do on the outside of my home? Regularly fix the edges of walkways and driveways and fix any cracks. Remove anything that might make you trip as you walk through a door, such as a raised step or threshold. Trim any bushes or trees on the path to your home. Use bright outdoor lighting. Clear any walking paths of anything that might make someone trip, such as rocks or tools. Regularly check to see if handrails are loose or broken. Make sure that both sides of any steps have handrails. Any raised decks and porches should have guardrails on the edges. Have any leaves, snow, or ice cleared regularly. Use sand or salt on walking paths during winter. Clean up any spills in  your garage right away. This includes oil or grease spills. What can I do in the bathroom? Use night lights. Install grab bars by the toilet and in the tub and shower. Do not use towel bars as grab bars. Use non-skid mats or decals in the tub or shower. If you need to sit down in the shower, use a plastic, non-slip stool. Keep the floor dry. Clean up any water that spills on the floor as soon as it happens. Remove soap buildup in the tub or shower regularly. Attach bath mats securely with double-sided non-slip rug tape. Do not have throw rugs and other things on the floor that can make you trip. What can I do in the bedroom? Use night lights. Make sure that you have a light by your bed that is easy to reach. Do not use any sheets or blankets that are too big for your bed. They should not hang down onto the floor. Have a firm chair that has side arms. You can use this for support while you get dressed. Do not have throw rugs and other things on the floor that can make you trip.  What can I do in the kitchen? Clean up any spills right away. Avoid walking on wet floors. Keep items that you use a lot in easy-to-reach places. If you need to reach something above you, use a strong step stool that has a grab bar. Keep electrical cords out of the way. Do not use floor polish or wax that makes floors slippery. If you must use wax, use non-skid floor wax. Do not have throw rugs and other things on the floor that can make you trip. What can I do with my stairs? Do not leave any items on the stairs. Make sure that there are handrails on both sides of the stairs and use them. Fix handrails that are broken or loose. Make sure that handrails are as long as the stairways. Check any carpeting to make sure that it is firmly attached to the stairs. Fix any carpet that is loose or worn. Avoid having throw rugs at the top or bottom of the stairs. If you do have throw rugs, attach them to the floor with carpet  tape. Make sure that you have a light switch at the top of the stairs and the bottom of the stairs. If you do not have them, ask someone to add them for you. What else can I do to help prevent falls? Wear shoes that: Do not have high heels. Have rubber bottoms. Are comfortable and fit you well. Are closed at the toe. Do not wear sandals. If you use a stepladder: Make sure that it is fully opened. Do not climb a closed stepladder. Make sure that both sides of the stepladder are locked into place. Ask someone to hold it for you, if possible. Clearly mark and make sure that you can see: Any grab bars or handrails. First and last steps. Where the edge of each step is. Use tools that help you move around (mobility aids) if they are needed. These include: Canes. Walkers. Scooters. Crutches. Turn on the lights when you go into a dark area. Replace any light bulbs as soon as they burn out. Set up your furniture so you have a clear path. Avoid moving your furniture around. If any of your floors are uneven, fix them. If there are any pets around you, be aware of where they are. Review your medicines with your doctor. Some medicines can make you feel dizzy. This can increase your chance of falling. Ask your doctor what other things that you can do to help prevent falls. This information is not intended to replace advice given to you by your health care provider. Make sure you discuss any questions you have with your health care provider. Document Released: 07/20/2009 Document Revised: 02/29/2016 Document Reviewed: 10/28/2014 Elsevier Interactive Patient Education  2017 [employer].

## 2023-03-18 NOTE — Progress Notes (Signed)
Subjective:  Patient ID: Bryan Shepard., male    DOB: [birth year], 67 y.o.   MRN: [id number]  Patient Care Team: Sonny Masters, FNP as PCP - General (Family Medicine) Oretha Milch, MD as Consulting Physician (Pulmonary Disease) Michaelle Copas, MD as Referring Physician (Optometry)   Chief Complaint:  Diarrhea (X 1 year on and off.  Has started having it more often now. )   HPI: Bryan Shepard. is a 67 y.o. male presenting on 03/18/2023 for Diarrhea (X 1 year on and off.  Has started having it more often now. )   Pt presents today for referral to GI. States he has had intermittent diarrhea for over 1.5 years. No specific triggers. Happens anywhere from 30 minutes to 2 hours after eating. No melena or hematochezia. No weight changes. No recent tick bites. Has tried adjusting diet without resolution of symptoms.   Diarrhea  This is a recurrent problem. Episode onset: 1.5 years ago. The problem has been waxing and waning. Pertinent negatives include no abdominal pain, arthralgias, bloating, chills, coughing, fever, headaches, increased  flatus, myalgias, sweats, URI, vomiting or weight loss. He has tried change of diet, increased fluids and electrolyte solution for the symptoms. The treatment provided no relief.     Relevant past medical, surgical, family, and social history reviewed and updated as indicated.  Allergies and medications reviewed and updated. Data reviewed: Chart in Epic.   Past Medical History:  Diagnosis Date   Acid reflux    Colon polyps    Diabetes mellitus type 2 in nonobese (HCC) 12/13/2020   Family history of genetic mutation for hereditary nonpolyposis colorectal cancer (HNPCC)    Family history of prostate cancer    Family history of stomach cancer    HOH (hard of hearing)    Hypertension    Sleep apnea    CPAP   Stroke (HCC) 2015   TIA (transient ischemic attack) 2016    Past Surgical History:  Procedure Laterality Date   CHOLECYSTECTOMY  N/A 04/17/2020   Procedure: LAPAROSCOPIC CHOLECYSTECTOMY;  Surgeon: Franky Macho, MD;  Location: [facility];  Service: General;  Laterality: N/A;   COLONOSCOPY  2018   COLONOSCOPY WITH PROPOFOL N/A 12/07/2021   Procedure: COLONOSCOPY WITH PROPOFOL;  Surgeon: Lanelle Bal, DO;  Location: [facility];  Service: Endoscopy;  Laterality: N/A;  9:45am   POLYPECTOMY  12/07/2021   Procedure: POLYPECTOMY;  Surgeon: Lanelle Bal, DO;  Location: [facility];  Service: Endoscopy;;    Social History   Socioeconomic History   Marital status: Married    Spouse name: [place]   Number of children: Not on file   Years of education: Not on file   Highest education level: Not on file  Occupational History   Occupation: retired     Comment: [employer]   Tobacco Use   Smoking status: Never   Smokeless tobacco: Never  Vaping Use   Vaping Use: Never used  Substance and Sexual Activity   Alcohol use: No   Drug use: No   Sexual activity: Yes    Birth control/protection: None  Other Topics Concern   Not on file  Social History Narrative   Married , 1 step daughter    Social Determinants of Health   Financial Resource Strain: Patient Declined (03/17/2023)   Overall Financial Resource Strain (CARDIA)    Difficulty of Paying Living Expenses: Patient declined  Food Insecurity: Patient Declined (03/17/2023)  Hunger Vital Sign    Worried About Running Out of Food in the Last Year: Patient declined    Ran Out of Food in the Last Year: Patient declined  Transportation Needs: Patient Declined (03/17/2023)   PRAPARE - Administrator, Civil Service (Medical): Patient declined    Lack of Transportation (Non-Medical): Patient declined  Physical Activity: Insufficiently Active (03/17/2023)   Exercise Vital Sign    Days of Exercise per Week: 2 days    Minutes of Exercise per Session: 30 min  Stress: No Stress Concern Present (03/17/2023)   [employer] of Occupational Health - Occupational  Stress Questionnaire    Feeling of Stress : Not at all  Social Connections: Unknown (03/17/2023)   Social Connection and Isolation Panel [NHANES]    Frequency of Communication with Friends and Family: Patient declined    Frequency of Social Gatherings with Friends and Family: Patient declined    Attends Religious Services: Patient declined    Database administrator or Organizations: Patient declined    Attends Banker Meetings: Not on file    Marital Status: Patient declined  Intimate Partner Violence: Not on file    Outpatient Encounter Medications as of 03/18/2023  Medication Sig   lisinopril (ZESTRIL) 10 MG tablet Take 1 tablet (10 mg total) by mouth daily.   metFORMIN (GLUCOPHAGE) 500 MG tablet Take 1 tablet (500 mg total) by mouth 2 (two) times daily with a meal.   [DISCONTINUED] lactobacillus acidophilus (BACID) TABS tablet Take 1 tablet by mouth daily at 2 PM.   [DISCONTINUED] OVER THE COUNTER MEDICATION Take 2 capsules by mouth daily. sucontral d   No facility-administered encounter medications on file as of 03/18/2023.    No Known Allergies  Review of Systems  Constitutional:  Negative for activity change, appetite change, chills, diaphoresis, fatigue, fever, unexpected weight change and weight loss.  HENT: Negative.    Eyes: Negative.   Respiratory:  Negative for cough, chest tightness and shortness of breath.   Cardiovascular:  Negative for chest pain, palpitations and leg swelling.  Gastrointestinal:  Positive for diarrhea. Negative for abdominal distention, abdominal pain, anal bleeding, bloating, blood in stool, constipation, flatus, nausea, rectal pain and vomiting.  Endocrine: Negative.   Genitourinary:  Negative for dysuria, frequency and urgency.  Musculoskeletal:  Negative for arthralgias and myalgias.  Skin: Negative.   Allergic/Immunologic: Negative.   Neurological:  Negative for dizziness and headaches.  Hematological: Negative.    Psychiatric/Behavioral:  Negative for confusion, hallucinations, sleep disturbance and suicidal ideas.   All other systems reviewed and are negative.       Objective:  BP 135/83   Pulse 71   Temp 97.7 F (36.5 C) (Temporal)   Ht 5\' 7"  (1.702 m)   Wt 203 lb 3.2 oz (92.2 kg)   SpO2 97%   BMI 31.83 kg/m    Wt Readings from Last 3 Encounters:  03/18/23 203 lb 3.2 oz (92.2 kg)  01/01/23 205 lb (93 kg)  11/18/22 206 lb (93.4 kg)    Physical Exam Vitals and nursing note reviewed.  Constitutional:      General: He is not in acute distress.    Appearance: Normal appearance. He is obese. He is not ill-appearing, toxic-appearing or diaphoretic.  HENT:     Head: Normocephalic and atraumatic.     Mouth/Throat:     Mouth: Mucous membranes are moist.  Eyes:     Pupils: Pupils are equal, round, and reactive to light.  Cardiovascular:     Rate and Rhythm: Normal rate and regular rhythm.     Heart sounds: Normal heart sounds.  Pulmonary:     Effort: Pulmonary effort is normal.     Breath sounds: Normal breath sounds.  Abdominal:     General: Abdomen is protuberant. Bowel sounds are normal. There is no distension or abdominal bruit. There are no signs of injury.     Palpations: Abdomen is soft.     Tenderness: There is no abdominal tenderness.  Musculoskeletal:     Right lower leg: No edema.     Left lower leg: No edema.  Skin:    General: Skin is warm and dry.     Capillary Refill: Capillary refill takes less than 2 seconds.  Neurological:     General: No focal deficit present.     Mental Status: He is alert and oriented to person, place, and time.  Psychiatric:        Mood and Affect: Mood normal.        Behavior: Behavior normal.        Thought Content: Thought content normal.        Judgment: Judgment normal.     Results for orders placed or performed in visit on 01/01/23  CBC with Differential/Platelet  Result Value Ref Range   WBC 7.0 3.4 - 10.8 x10E3/uL   RBC  4.83 4.14 - 5.80 x10E6/uL   Hemoglobin 14.1 13.0 - 17.7 g/dL   Hematocrit 16.1 09.6 - 51.0 %   MCV 88 79 - 97 fL   MCH 29.2 26.6 - 33.0 pg   MCHC 33.3 31.5 - 35.7 g/dL   RDW 04.5 40.9 - 81.1 %   Platelets 197 150 - 450 x10E3/uL   Neutrophils 60 Not Estab. %   Lymphs 30 Not Estab. %   Monocytes 7 Not Estab. %   Eos 2 Not Estab. %   Basos 1 Not Estab. %   Neutrophils Absolute 4.2 1.4 - 7.0 x10E3/uL   Lymphocytes Absolute 2.1 0.7 - 3.1 x10E3/uL   Monocytes Absolute 0.5 0.1 - 0.9 x10E3/uL   EOS (ABSOLUTE) 0.1 0.0 - 0.4 x10E3/uL   Basophils Absolute 0.0 0.0 - 0.2 x10E3/uL   Immature Granulocytes 0 Not Estab. %   Immature Grans (Abs) 0.0 0.0 - 0.1 x10E3/uL  CMP14+EGFR  Result Value Ref Range   Glucose 172 (H) 70 - 99 mg/dL   BUN 14 8 - 27 mg/dL   Creatinine, Ser 9.14 (H) 0.76 - 1.27 mg/dL   eGFR 59 (L) >78 GN/FAO/1.30   BUN/Creatinine Ratio 10 10 - 24   Sodium 143 134 - 144 mmol/L   Potassium 4.4 3.5 - 5.2 mmol/L   Chloride 104 96 - 106 mmol/L   CO2 23 20 - 29 mmol/L   Calcium 9.5 8.6 - 10.2 mg/dL   Total Protein 6.4 6.0 - 8.5 g/dL   Albumin 4.3 3.9 - 4.9 g/dL   Globulin, Total 2.1 1.5 - 4.5 g/dL   Albumin/Globulin Ratio 2.0 1.2 - 2.2   Bilirubin Total 0.4 0.0 - 1.2 mg/dL   Alkaline Phosphatase 70 44 - 121 IU/L   AST 19 0 - 40 IU/L   ALT 29 0 - 44 IU/L  Lipid panel  Result Value Ref Range   Cholesterol, Total 157 100 - 199 mg/dL   Triglycerides 865 0 - 149 mg/dL   HDL 31 (L) >78 mg/dL   VLDL Cholesterol Cal 24 5 - 40 mg/dL   LDL  Chol Calc (NIH) 102 (H) 0 - 99 mg/dL   Chol/HDL Ratio 5.1 (H) 0.0 - 5.0 ratio  Thyroid Panel With TSH  Result Value Ref Range   TSH 1.290 0.450 - 4.500 uIU/mL   T4, Total 8.6 4.5 - 12.0 ug/dL   T3 Uptake Ratio 24 24 - 39 %   Free Thyroxine Index 2.1 1.2 - 4.9  Bayer DCA Hb A1c Waived  Result Value Ref Range   HB A1C (BAYER DCA - WAIVED) 7.7 (H) 4.8 - 5.6 %  Microalbumin / creatinine urine ratio  Result Value Ref Range   Creatinine, Urine  171.8 Not Estab. mg/dL   Microalbumin, Urine 16.1 Not Estab. ug/mL   Microalb/Creat Ratio 6 0 - 29 mg/g creat  VITAMIN D 25 Hydroxy (Vit-D Deficiency, Fractures)  Result Value Ref Range   Vit D, 25-Hydroxy 35.1 30.0 - 100.0 ng/mL  PSA, total and free  Result Value Ref Range   Prostate Specific Ag, Serum 0.5 0.0 - 4.0 ng/mL   PSA, Free 0.21 N/A ng/mL   PSA, Free Pct 42.0 %       Pertinent labs & imaging results that were available during my care of the patient were reviewed by me and considered in my medical decision making.  Assessment & Plan:  Mancil was seen today for diarrhea.  Diagnoses and all orders for this visit:  Diarrhea in adult patient Ongoing for over a year. Not worsening. Has not been seen for this in the past. Unknown triggers. Will check for possible celiac disease. Referral to GI placed for further evaluation. Aware to keep a diet and bowel movement log over the next several weeks and take to follow up with GI. Aware to report new, worsening, or persistent symptoms.  -     Ambulatory referral to Gastroenterology -     Celiac Disease Comprehensive Panel with Reflexes     Continue all other maintenance medications.  Follow up plan: Return in about 1 month (around 04/17/2023), or if symptoms worsen or fail to improve, for DM.   Continue healthy lifestyle choices, including diet (rich in fruits, vegetables, and lean proteins, and low in salt and simple carbohydrates) and exercise (at least 30 minutes of moderate physical activity daily).  Educational handout given for diarrhea   The above assessment and management plan was discussed with the patient. The patient verbalized understanding of and has agreed to the management plan. Patient is aware to call the clinic if they develop any new symptoms or if symptoms persist or worsen. Patient is aware when to return to the clinic for a follow-up visit. Patient educated on when it is appropriate to go to the emergency  department.   Kari Baars, FNP-C [facility] [phone]

## 2023-03-18 NOTE — Progress Notes (Signed)
Subjective:   Bryan Amor. is a 67 y.o. male who presents for Medicare Annual/Subsequent preventive examination. I connected with  [name] on 03/18/23 by a audio enabled telemedicine application and verified that I am speaking with the correct person using two identifiers.  Patient Location: Home  Provider Location: Home Office  I discussed the limitations of evaluation and management by telemedicine. The patient expressed understanding and agreed to proceed.  Review of Systems     Cardiac Risk Factors include: advanced age (>19men, >58 women);diabetes mellitus;male gender;hypertension     Objective:    Today's Vitals   03/18/23 1417  Weight: 203 lb (92.1 kg)  Height: 5\' 7"  (1.702 m)   Body mass index is 31.79 kg/m.     03/18/2023    2:20 PM 12/07/2021    8:31 AM 04/17/2020    6:35 AM 04/15/2020    1:00 PM 04/15/2020    4:45 AM 11/07/2018    8:46 PM 11/13/2014   12:34 AM  Advanced Directives  Does Patient Have a Medical Advance Directive? Yes Yes Yes No No No No  Type of Estate agent of [place];Living will Healthcare Power of Attorney Living will      Does patient want to make changes to medical advance directive?   No - Patient declined      Copy of Healthcare Power of Attorney in Chart? No - copy requested        Would patient like information on creating a medical advance directive?    No - Patient declined  No - Patient declined No - patient declined information    Current Medications (verified) Outpatient Encounter Medications as of 03/18/2023  Medication Sig   lisinopril (ZESTRIL) 10 MG tablet Take 1 tablet (10 mg total) by mouth daily.   metFORMIN (GLUCOPHAGE) 500 MG tablet Take 1 tablet (500 mg total) by mouth 2 (two) times daily with a meal.   No facility-administered encounter medications on file as of 03/18/2023.    Allergies (verified) Patient has no known allergies.   History: Past Medical History:  Diagnosis Date    Acid reflux    Colon polyps    Diabetes mellitus type 2 in nonobese (HCC) 12/13/2020   Family history of genetic mutation for hereditary nonpolyposis colorectal cancer (HNPCC)    Family history of prostate cancer    Family history of stomach cancer    HOH (hard of hearing)    Hypertension    Sleep apnea    CPAP   Stroke (HCC) 2015   TIA (transient ischemic attack) 2016   Past Surgical History:  Procedure Laterality Date   CHOLECYSTECTOMY N/A 04/17/2020   Procedure: LAPAROSCOPIC CHOLECYSTECTOMY;  Surgeon: Franky Macho, MD;  Location: [facility];  Service: General;  Laterality: N/A;   COLONOSCOPY  2018   COLONOSCOPY WITH PROPOFOL N/A 12/07/2021   Procedure: COLONOSCOPY WITH PROPOFOL;  Surgeon: Lanelle Bal, DO;  Location: [facility];  Service: Endoscopy;  Laterality: N/A;  9:45am   POLYPECTOMY  12/07/2021   Procedure: POLYPECTOMY;  Surgeon: Lanelle Bal, DO;  Location: [facility];  Service: Endoscopy;;   Family History  Problem Relation Age of Onset   Melanoma Mother 56   Hypertension Mother    Transient ischemic attack Mother    Stroke Mother    Heart disease Mother    Prostate cancer Father 59   Stomach cancer Father 65       PMS2+; Lynch syndrome  Melanoma Father    Diabetes Father    Heart disease Father    Stroke Maternal Grandmother    Lung cancer Paternal Grandmother    Throat cancer Paternal Grandmother    Prostate cancer Paternal Grandfather 67   Bladder Cancer Other        MGM's mother with urethra cancer   Social History   Socioeconomic History   Marital status: Married    Spouse name: [place]   Number of children: Not on file   Years of education: Not on file   Highest education level: Not on file  Occupational History   Occupation: retired     Comment: [employer]   Tobacco Use   Smoking status: Never   Smokeless tobacco: Never  Vaping Use   Vaping Use: Never used  Substance and Sexual Activity   Alcohol use: No   Drug use: No   Sexual  activity: Yes    Birth control/protection: None  Other Topics Concern   Not on file  Social History Narrative   Married , 1 step daughter    Social Determinants of Health   Financial Resource Strain: Low Risk  (03/18/2023)   Overall Financial Resource Strain (CARDIA)    Difficulty of Paying Living Expenses: Not hard at all  Food Insecurity: No Food Insecurity (03/18/2023)   Hunger Vital Sign    Worried About Running Out of Food in the Last Year: Never true    Ran Out of Food in the Last Year: Never true  Transportation Needs: No Transportation Needs (03/18/2023)   PRAPARE - Administrator, Civil Service (Medical): No    Lack of Transportation (Non-Medical): No  Physical Activity: Insufficiently Active (03/18/2023)   Exercise Vital Sign    Days of Exercise per Week: 2 days    Minutes of Exercise per Session: 30 min  Stress: No Stress Concern Present (03/18/2023)   [employer] of Occupational Health - Occupational Stress Questionnaire    Feeling of Stress : Not at all  Social Connections: Socially Integrated (03/18/2023)   Social Connection and Isolation Panel [NHANES]    Frequency of Communication with Friends and Family: More than three times a week    Frequency of Social Gatherings with Friends and Family: More than three times a week    Attends Religious Services: More than 4 times per year    Active Member of [employer] or Organizations: Yes    Attends Engineer, structural: More than 4 times per year    Marital Status: Married    Tobacco Counseling Counseling given: Not Answered   Clinical Intake:  Pre-visit preparation completed: Yes  Pain : No/denies pain     Nutritional Risks: None Diabetes: Yes CBG done?: No Did pt. bring in CBG monitor from home?: No  How often do you need to have someone help you when you read instructions, pamphlets, or other written materials from your doctor or pharmacy?: 1 - Never  Diabetic?yes Nutrition Risk  Assessment:  Has the patient had any N/V/D within the last 2 months?  No  Does the patient have any non-healing wounds?  No  Has the patient had any unintentional weight loss or weight gain?  No   Diabetes:  Is the patient diabetic?  Yes  If diabetic, was a CBG obtained today?  No  Did the patient bring in their glucometer from home?  No  How often do you monitor your CBG's? Never .   Financial Strains and Diabetes Management:  Are you having any financial strains with the device, your supplies or your medication? No .  Does the patient want to be seen by Chronic Care Management for management of their diabetes?  No  Would the patient like to be referred to a Nutritionist or for Diabetic Management?  No   Diabetic Exams:  Diabetic Eye Exam: Completed 04/2022 Diabetic Foot Exam: Overdue, Pt has been advised about the importance in completing this exam. Pt is scheduled for diabetic foot exam on next office visit .   Interpreter Needed?: No  Information entered by :: Renie Ora, LPN   Activities of Daily Living    03/18/2023    2:21 PM 03/17/2023   11:11 AM  In your present state of health, do you have any difficulty performing the following activities:  Hearing? 0 0  Vision? 0 0  Difficulty concentrating or making decisions? 0 0  Walking or climbing stairs? 0 0  Dressing or bathing? 0 0  Doing errands, shopping? 0   Preparing Food and eating ? N N  Using the Toilet? N N  In the past six months, have you accidently leaked urine? N N  Do you have problems with loss of bowel control? N N  Managing your Medications? N N  Managing your Finances? N N  Housekeeping or managing your Housekeeping? N N    Patient Care Team: Sonny Masters, FNP as PCP - General (Family Medicine) Oretha Milch, MD as Consulting Physician (Pulmonary Disease) Michaelle Copas, MD as Referring Physician (Optometry)  Indicate any recent Medical Services you may have received from other than [facility]  providers in the past year (date may be approximate).     Assessment:   This is a routine wellness examination for Bryan Shepard.  Hearing/Vision screen Vision Screening - Comments:: Wears rx glasses - up to date with routine eye exams with  Dr.Lee   Dietary issues and exercise activities discussed: Current Exercise Habits: Home exercise routine, Type of exercise: walking, Time (Minutes): 30, Frequency (Times/Week): 2, Weekly Exercise (Minutes/Week): 60, Intensity: Mild, Exercise limited by: None identified   Goals Addressed             This Visit's Progress    Exercise 3x per week (30 min per time)         Depression Screen    03/18/2023    2:19 PM 01/01/2023   10:31 AM 11/18/2022    3:46 PM 10/01/2022   10:22 AM 03/28/2022    9:07 AM 09/26/2021   10:11 AM 08/28/2021   10:48 AM  PHQ 2/9 Scores  PHQ - 2 Score 0 0  0 0 0 0  PHQ- 9 Score  0  0 0 0   Exception Documentation   Other- indicate reason in comment box        Fall Risk    03/18/2023    2:18 PM 03/17/2023   11:11 AM 01/01/2023   10:30 AM 11/18/2022    3:44 PM 10/01/2022   10:33 AM  Fall Risk   Falls in the past year? 0  0 0 0  Number falls in past yr: 0 0   0  Injury with Fall? 0 0   0  Risk for fall due to : No Fall Risks    No Fall Risks  Follow up Falls prevention discussed    Education provided    FALL RISK PREVENTION PERTAINING TO THE HOME:  Any stairs in or around the home? Yes  If so, are there any without handrails? No  Home free of loose throw rugs in walkways, pet beds, electrical cords, etc? Yes  Adequate lighting in your home to reduce risk of falls? Yes   ASSISTIVE DEVICES UTILIZED TO PREVENT FALLS:  Life alert? No  Use of a cane, walker or w/c? No  Grab bars in the bathroom? No  Shower chair or bench in shower? No  Elevated toilet seat or a handicapped toilet? No      03/18/2023    2:21 PM  6CIT Screen  What Year? 0 points  What month? 0 points  What time? 0 points  Count back from 20 0  points  Months in reverse 0 points  Repeat phrase 0 points  Total Score 0 points    Immunizations Immunization History  Administered Date(s) Administered   IPV 07/04/2008   Meningococcal Conjugate 07/04/2008   Tdap 07/04/2008, 08/11/2018   Typhoid Live 07/04/2008   Yellow Fever 07/04/2008    TDAP status: Up to date  Flu Vaccine status: Declined, Education has been provided regarding the importance of this vaccine but patient still declined. Advised may receive this vaccine at local pharmacy or Health Dept. Aware to provide a copy of the vaccination record if obtained from local pharmacy or Health Dept. Verbalized acceptance and understanding.  Pneumococcal vaccine status: Due, Education has been provided regarding the importance of this vaccine. Advised may receive this vaccine at local pharmacy or Health Dept. Aware to provide a copy of the vaccination record if obtained from local pharmacy or Health Dept. Verbalized acceptance and understanding.  Covid-19 vaccine status: Declined, Education has been provided regarding the importance of this vaccine but patient still declined. Advised may receive this vaccine at local pharmacy or Health Dept.or vaccine clinic. Aware to provide a copy of the vaccination record if obtained from local pharmacy or Health Dept. Verbalized acceptance and understanding.  Qualifies for Shingles Vaccine? Yes   Zostavax completed No   Shingrix Completed?: No.    Education has been provided regarding the importance of this vaccine. Patient has been advised to call insurance company to determine out of pocket expense if they have not yet received this vaccine. Advised may also receive vaccine at local pharmacy or Health Dept. Verbalized acceptance and understanding.  Screening Tests Health Maintenance  Topic Date Due   COVID-19 Vaccine (1) Never done   OPHTHALMOLOGY EXAM  01/09/2022   Pneumonia Vaccine 62+ Years old (1 of 1 - PCV) 03/29/2023 (Originally  03/15/2022)   Hepatitis C Screening  03/29/2023 (Originally 03/16/1974)   Zoster Vaccines- Shingrix (1 of 2) 04/03/2023 (Originally [birth year]15/1976)   FOOT EXAM  03/29/2023   INFLUENZA VACCINE  05/08/2023   HEMOGLOBIN A1C  07/04/2023   Diabetic kidney evaluation - eGFR measurement  01/01/2024   Diabetic kidney evaluation - Urine ACR  01/01/2024   Medicare Annual Wellness (AWV)  03/17/2024   Colonoscopy  12/08/2026   DTaP/Tdap/Td (3 - Td or Tdap) 08/11/2028   HPV VACCINES  Aged Out    Health Maintenance  Health Maintenance Due  Topic Date Due   COVID-19 Vaccine (1) Never done   OPHTHALMOLOGY EXAM  01/09/2022    Colorectal cancer screening: Type of screening: Colonoscopy. Completed 12/07/2021. Repeat every 5 years  Lung Cancer Screening: (Low Dose CT Chest recommended if Age 44-80 years, 30 pack-year currently smoking OR have quit w/in 15years.) does not qualify.   Lung Cancer Screening Referral: n/a  Additional Screening:  Hepatitis C Screening: does not  qualify;   Vision Screening: Recommended annual ophthalmology exams for early detection of glaucoma and other disorders of the eye. Is the patient up to date with their annual eye exam?  Yes  Who is the provider or what is the name of the office in which the patient attends annual eye exams? Dr.Lee  If pt is not established with a provider, would they like to be referred to a provider to establish care? No .   Dental Screening: Recommended annual dental exams for proper oral hygiene  Community Resource Referral / Chronic Care Management: CRR required this visit?  No   CCM required this visit?  No      Plan:     I have personally reviewed and noted the following in the patient's chart:   Medical and social history Use of alcohol, tobacco or illicit drugs  Current medications and supplements including opioid prescriptions. Patient is not currently taking opioid prescriptions. Functional ability and status Nutritional  status Physical activity Advanced directives List of other physicians Hospitalizations, surgeries, and ER visits in previous 12 months Vitals Screenings to include cognitive, depression, and falls Referrals and appointments  In addition, I have reviewed and discussed with patient certain preventive protocols, quality metrics, and best practice recommendations. A written personalized care plan for preventive services as well as general preventive health recommendations were provided to patient.     Lorrene Reid, LPN   1/61/0960   Nurse Notes: Due Pneumonia Vaccine

## 2023-03-19 LAB — CELIAC DISEASE COMPREHENSIVE PANEL WITH REFLEXES
IgA/Immunoglobulin A, Serum: 147 mg/dL (ref 61–437)
Transglutaminase IgA: <2 U/mL (ref 0–3)

## 2023-03-30 NOTE — Progress Notes (Unsigned)
Referring Provider: Sonny Masters, FNP Primary Care Physician:  Sonny Masters, FNP Primary GI Physician: Dr. Marletta Lor  Chief Complaint  Patient presents with   Diarrhea    Diarrhea after he eats. Has had it for a while.     HPI:   Bryan Shepard. is a 67 y.o. male presenting today with a history of diabetes, HTN, stroke/TIA, reflux, family history of Lynch syndrome, personal history colon polyps, presenting today at the request of Gilford Silvius, FNP for diarrhea.  Patient was last seen in our office 11/15/2021 by Dr. Marletta Lor to discuss scheduling colonoscopy.  Reported mild constipation with occasional overflow diarrhea.  No other significant symptoms.  He was scheduled for colonoscopy.  Advised to use MiraLAX daily along with fiber to help with constipation.  Colonoscopy 12/07/2021 with nonbleeding internal hemorrhoids, 8 mm polyp (tubular adenoma) in the descending colon removed.  Recommended 5-year surveillance.  Recently completed celiac screen which was negative. TSH within normal limits in March.  Today: Reports intermittent diarrhea started about a year and a half ago.  Has not been able to identify any specific food triggers though when the diarrhea occurs, it does seem to happen 1 or 2 hours after a meal, but not always.  In general, diarrhea occurs 1 or 2 days a week and will have about 2 watery bowel movements on these days.  He also deals with intermittent constipation skipping 2 or 3 days between a bowel movement or having hard stools.  Denies any associated abdominal pain, BRBPR, melena.  Started metformin in March 2024, but does not take it regularly.  Has not noticed any increased diarrhea when he does take metformin. Had his gallbladder removed about 2 years ago.  Never started MiraLAX or benefiber.   Past Medical History:  Diagnosis Date   Acid reflux    Colon polyps    Diabetes mellitus type 2 in nonobese (HCC) 12/13/2020   Family history of genetic mutation for  hereditary nonpolyposis colorectal cancer (HNPCC)    Family history of prostate cancer    Family history of stomach cancer    HOH (hard of hearing)    Hypertension    Sleep apnea    CPAP   Stroke (HCC) 2015   TIA (transient ischemic attack) 2016    Past Surgical History:  Procedure Laterality Date   CHOLECYSTECTOMY N/A 04/17/2020   Procedure: LAPAROSCOPIC CHOLECYSTECTOMY;  Surgeon: Franky Macho, MD;  Location: [facility];  Service: General;  Laterality: N/A;   COLONOSCOPY  2018   COLONOSCOPY WITH PROPOFOL N/A 12/07/2021   Surgeon: Earnest Bailey K, DO;   nonbleeding internal hemorrhoids, 8 mm polyp (tubular adenoma) in the descending colon removed.  Recommended 5-year surveillance.   POLYPECTOMY  12/07/2021   Procedure: POLYPECTOMY;  Surgeon: Lanelle Bal, DO;  [facility];  Service: Endoscopy;;    Current Outpatient Medications  Medication Sig Dispense Refill   lisinopril (ZESTRIL) 10 MG tablet Take 1 tablet (10 mg total) by mouth daily. 90 tablet 2   metFORMIN (GLUCOPHAGE) 500 MG tablet Take 1 tablet (500 mg total) by mouth 2 (two) times daily with a meal. 180 tablet 3   No current facility-administered medications for this visit.    Allergies as of 04/02/2023   (No Known Allergies)    Family History  Problem Relation Age of Onset   Melanoma Mother 18   Hypertension Mother    Transient ischemic attack Mother    Stroke Mother  Heart disease Mother    Prostate cancer Father 15   Stomach cancer Father 78       PMS2+; Lynch syndrome   Melanoma Father    Diabetes Father    Heart disease Father    Stroke Maternal Grandmother    Lung cancer Paternal Grandmother    Throat cancer Paternal Grandmother    Prostate cancer Paternal Grandfather 74   Bladder Cancer Other        MGM's mother with urethra cancer    Social History   Socioeconomic History   Marital status: Married    Spouse name: [place]   Number of children: Not on file   Years of  education: Not on file   Highest education level: Not on file  Occupational History   Occupation: retired     Comment: [employer]   Tobacco Use   Smoking status: Never   Smokeless tobacco: Never  Building services engineer Use: Never used  Substance and Sexual Activity   Alcohol use: No   Drug use: No   Sexual activity: Yes    Birth control/protection: None  Other Topics Concern   Not on file  Social History Narrative   Married , 1 step daughter    Social Determinants of Health   Financial Resource Strain: Low Risk  (03/18/2023)   Overall Financial Resource Strain (CARDIA)    Difficulty of Paying Living Expenses: Not hard at all  Food Insecurity: No Food Insecurity (03/18/2023)   Hunger Vital Sign    Worried About Running Out of Food in the Last Year: Never true    Ran Out of Food in the Last Year: Never true  Transportation Needs: No Transportation Needs (03/18/2023)   PRAPARE - Administrator, Civil Service (Medical): No    Lack of Transportation (Non-Medical): No  Physical Activity: Insufficiently Active (03/18/2023)   Exercise Vital Sign    Days of Exercise per Week: 2 days    Minutes of Exercise per Session: 30 min  Stress: No Stress Concern Present (03/18/2023)   [employer] of Occupational Health - Occupational Stress Questionnaire    Feeling of Stress : Not at all  Social Connections: Socially Integrated (03/18/2023)   Social Connection and Isolation Panel [NHANES]    Frequency of Communication with Friends and Family: More than three times a week    Frequency of Social Gatherings with Friends and Family: More than three times a week    Attends Religious Services: More than 4 times per year    Active Member of [employer] or Organizations: Yes    Attends Engineer, structural: More than 4 times per year    Marital Status: Married    Review of Systems: Gen: Denies fever, chills, cold or flulike symptoms, presyncope, syncope. CV: Denies chest pain,  palpitations. Resp: Denies dyspnea at rest, cough.Marland Kitchen GI: See HPI Heme: See HPI  Physical Exam: BP 136/83 (BP Location: Right Arm, Patient Position: Sitting, Cuff Size: Normal)   Pulse 61   Temp 97.6 F (36.4 C) (Temporal)   Ht 5\' 7"  (1.702 m)   Wt 203 lb 9.6 oz (92.4 kg)   SpO2 99%   BMI 31.89 kg/m  General:   Alert and oriented. No distress noted. Pleasant and cooperative.  Head:  Normocephalic and atraumatic. Eyes:  Conjuctiva clear without scleral icterus. Heart:  S1, S2 present without murmurs appreciated. Lungs:  Clear to auscultation bilaterally. No wheezes, rales, or rhonchi. No distress.  Abdomen:  +BS,  soft, non-tender and non-distended. No rebound or guarding. No HSM or masses noted. Msk:  Symmetrical without gross deformities. Normal posture. Extremities:  Without edema. Neurologic:  Alert and  oriented x4 Psych:  Normal mood and affect.    Assessment:  67 year old male with a history of diabetes, HTN, stroke/TIA, reflux, family history of Lynch syndrome, personal history colon polyps with colonoscopy up-to-date in March 2023, presenting today at the request of Gilford Silvius, FNP for diarrhea.  Alternating constipation and diarrhea: Symptoms seem most consistent with chronic, baseline constipation with overflow diarrhea.  He does have history of cholecystectomy and could also experience intermittent bile salt diarrhea.  No alarm symptoms. Colonoscopy March 2023 with internal hemorrhoids, 8 mm tubular adenoma, otherwise normal exam.  Celiac screen and TSH recently normal.  Recommended starting Benefiber daily, MiraLAX if needed to treat baseline constipation in hopes to prevent overflow diarrhea.   Plan:  Start Benefiber 3 teaspoons daily x 2 weeks, then increase to twice daily. After about 4 weeks, if still having intermittent constipation, start MiraLAX 17 g daily.  Advised he can decrease MiraLAX to one half dose daily or every other day if he begins to have frequent  diarrhea with this. Avoid fried, fatty, greasy foods. Requested he keep a stool log over the next several weeks so we can see how the medications have improved his bowel function or help identify triggers of his loose stools. Follow-up in the office in 8 weeks.   Ermalinda Memos, PA-C [facility] 04/02/2023

## 2023-04-01 ENCOUNTER — Ambulatory Visit: Payer: Medicare Other | Admitting: Family Medicine

## 2023-04-02 ENCOUNTER — Encounter: Payer: Self-pay | Admitting: Gastroenterology

## 2023-04-02 ENCOUNTER — Ambulatory Visit (INDEPENDENT_AMBULATORY_CARE_PROVIDER_SITE_OTHER): Payer: Medicare Other | Admitting: Gastroenterology

## 2023-04-02 VITALS — BP 136/83 | HR 61 | Temp 97.6°F | Ht 67.0 in | Wt 203.6 lb

## 2023-04-02 DIAGNOSIS — R198 Other specified symptoms and signs involving the digestive system and abdomen: Secondary | ICD-10-CM

## 2023-04-02 NOTE — Patient Instructions (Addendum)
I suspect that you have chronic baseline constipation with overflow diarrhea.   Start Benefiber 3 teaspoons daily for 2 weeks, then increase to twice a day.  After about 4 weeks, if you are still having some intermittent constipation, I recommend that you start MiraLAX 1 capful (17 g) daily in 8 ounces of water.  If you begin to have frequent loose bowel movements on a daily occurrence, you can decrease MiraLAX to one half capful daily or every other day.   The goal with the above medications is free to have a good productive bowel movement daily to prevent overflow diarrhea.  As we discussed, as you do not have a gallbladder, you may experience loose stools if you eat fried, fatty, greasy foods.  Please limit these foods is much as possible.  Try to keep a stool log over the next several weeks so that when you come back to your next appointment, we can see how the medications are working for you and if you have identified any triggers of your loose stools.  We will follow-up with you in the office in about 8 weeks.  It was very nice to meet you today!  Ermalinda Memos, PA-C [facility]

## 2023-05-02 ENCOUNTER — Ambulatory Visit: Payer: Medicare Other | Admitting: Family Medicine

## 2023-05-26 ENCOUNTER — Ambulatory Visit: Payer: TRICARE For Life (TFL) | Admitting: Gastroenterology

## 2023-05-28 ENCOUNTER — Ambulatory Visit: Payer: TRICARE For Life (TFL) | Admitting: Gastroenterology

## 2023-06-26 NOTE — Progress Notes (Signed)
Referring Provider: Sonny Masters, FNP Primary Care Physician:  Sonny Masters, FNP Primary GI Physician: Dr. Marletta Lor  Chief Complaint  Patient presents with   Follow-up    Follow up. Still having problems with diarrhea sometimes and constipation.     HPI:   Bryan Shepard. is a 68 y.o. male with a history of diabetes, HTN, stroke/TIA, reflux, family history of Lynch syndrome, personal history colon polyps, presenting today for follow-up of alternating constipation and diarrhea.  Colonoscopy 12/07/2021 with nonbleeding internal hemorrhoids, 8 mm polyp (tubular adenoma) in the descending colon removed.  Recommended 5-year surveillance.  TSH within normal limits and celiac screen negative earlier this year.  Last seen in the office 04/02/2023 noting intermittent diarrhea started about a year and a half ago with no identified specific food trigger though it did seem to occur 1 or 2 hours after a meal.  On average, diarrhea occurring 1 or 2 days a week with 2 watery bowel movements on these days.  Also with intermittent constipation skipping 2 or 3 days between bowel movements or having hard stools.  Denies associated abdominal pain, BRBPR, melena.  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  Recommended starting Benefiber daily, MiraLAX if needed to treat baseline constipation in hopes to prevent overflow diarrhea.  Requested patient keep a stool log over the next several weeks so we can see how medications have improved his bowel function or to help identify triggers of loose stools.  Today: Continues to have alternating bowel habits with occasional constipation or diarrhea.  Here lately, seems that his bowels been moving every other day and fairly normal, but still with some breakthrough diarrhea.  Notes diarrhea after eating # puppies, always barbecue chicken wrap, fries and original chicken sandwich.  No  associated abdominal pain, BRBPR, melena, weight loss.  He has not started Benefiber or MiraLAX as previously recommended as he was concerned about whether or not he may have diarrhea while he was on vacation or at a funeral as he has had to go to several recently.    Past Medical History:  Diagnosis Date   Acid reflux    Colon polyps    Diabetes mellitus type 2 in nonobese (HCC) 12/13/2020   Family history of genetic mutation for hereditary nonpolyposis colorectal cancer (HNPCC)    Family history of prostate cancer    Family history of stomach cancer    HOH (hard of hearing)    Hypertension    Sleep apnea    CPAP   Stroke (HCC) 2015   TIA (transient ischemic attack) 2016    Past Surgical History:  Procedure Laterality Date   CHOLECYSTECTOMY N/A 04/17/2020   Procedure: LAPAROSCOPIC CHOLECYSTECTOMY;  Surgeon: Franky Macho, MD;  Location: [facility];  Service: General;  Laterality: N/A;   COLONOSCOPY  2018   COLONOSCOPY WITH PROPOFOL N/A 12/07/2021   Surgeon: Earnest Bailey K, DO;   nonbleeding internal hemorrhoids, 8 mm polyp (tubular adenoma) in the descending colon removed.  Recommended 5-year surveillance.   POLYPECTOMY  12/07/2021   Procedure: POLYPECTOMY;  Surgeon: Lanelle Bal, DO;  Location: [facility];  Service: Endoscopy;;    Current Outpatient Medications  Medication Sig Dispense Refill   lisinopril (ZESTRIL) 10 MG tablet Take 1 tablet (10 mg total) by mouth daily. 90 tablet 2   No current facility-administered medications for this visit.    Allergies as of 06/27/2023   (No  Known Allergies)    Family History  Problem Relation Age of Onset   Melanoma Mother 90   Hypertension Mother    Transient ischemic attack Mother    Stroke Mother    Heart disease Mother    Prostate cancer Father 14   Stomach cancer Father 38       PMS2+; Lynch syndrome   Melanoma Father    Diabetes Father    Heart disease Father    Stroke Maternal Grandmother    Lung cancer  Paternal Grandmother    Throat cancer Paternal Grandmother    Prostate cancer Paternal Grandfather 39   Bladder Cancer Other        MGM's mother with urethra cancer    Social History   Socioeconomic History   Marital status: Married    Spouse name: [place]   Number of children: Not on file   Years of education: Not on file   Highest education level: Not on file  Occupational History   Occupation: retired     Comment: [employer]   Tobacco Use   Smoking status: Never   Smokeless tobacco: Never  Vaping Use   Vaping status: Never Used  Substance and Sexual Activity   Alcohol use: No   Drug use: No   Sexual activity: Yes    Birth control/protection: None  Other Topics Concern   Not on file  Social History Narrative   Married , 1 step daughter    Social Determinants of Health   Financial Resource Strain: Low Risk  (03/18/2023)   Overall Financial Resource Strain (CARDIA)    Difficulty of Paying Living Expenses: Not hard at all  Food Insecurity: No Food Insecurity (03/18/2023)   Hunger Vital Sign    Worried About Running Out of Food in the Last Year: Never true    Ran Out of Food in the Last Year: Never true  Transportation Needs: No Transportation Needs (03/18/2023)   PRAPARE - Administrator, Civil Service (Medical): No    Lack of Transportation (Non-Medical): No  Physical Activity: Insufficiently Active (03/18/2023)   Exercise Vital Sign    Days of Exercise per Week: 2 days    Minutes of Exercise per Session: 30 min  Stress: No Stress Concern Present (03/18/2023)   [employer] of Occupational Health - Occupational Stress Questionnaire    Feeling of Stress : Not at all  Social Connections: Socially Integrated (03/18/2023)   Social Connection and Isolation Panel [NHANES]    Frequency of Communication with Friends and Family: More than three times a week    Frequency of Social Gatherings with Friends and Family: More than three times a week    Attends Religious  Services: More than 4 times per year    Active Member of [employer] or Organizations: Yes    Attends Engineer, structural: More than 4 times per year    Marital Status: Married    Review of Systems: Gen: Denies fever, chills, cold or flulike symptoms, presyncope, syncope. CV: Denies chest pain, palpitations. Resp: Denies dyspnea, cough.  GI: See HPI Heme: See HPI  Physical Exam: BP 135/83 (BP Location: Right Arm, Patient Position: Sitting, Cuff Size: Normal)   Pulse 64   Temp 97.9 F (36.6 C) (Temporal)   Ht 5\' 7"  (1.702 m)   Wt 207 lb (93.9 kg)   SpO2 98%   BMI 32.42 kg/m  General:   Alert and oriented. No distress noted. Pleasant and cooperative.  Head:  Normocephalic  and atraumatic. Eyes:  Conjuctiva clear without scleral icterus. Heart:  S1, S2 present without murmurs appreciated. Lungs:  Clear to auscultation bilaterally. No wheezes, rales, or rhonchi. No distress.  Abdomen:  +BS, soft, non-tender and non-distended. No rebound or guarding. No HSM or masses noted. Msk:  Symmetrical without gross deformities. Normal posture. Extremities:  Without edema. Neurologic:  Alert and  oriented x4 Psych:  Normal mood and affect.    Assessment:  67 y.o. male with a history of diabetes, HTN, stroke/TIA, reflux, family history of Lynch syndrome, personal history colon polyps, presenting today for follow-up of alternating constipation and diarrhea.   Alternating constipation and diarrhea: Likely related to baseline mild constipation with either intermittent overflow diarrhea or more likely intermittent bile salt diarrhea as he has had cholecystectomy and notes most of his diarrhea is occurring after eating fried foods.  No alarm symptoms.  Has not started Benefiber or MiraLAX as previously recommended.  Colonoscopy is up-to-date in March 2023, due for surveillance in 2028.  I will have him start Benefiber and add MiraLAX if needed.  If bowel regularity improves, but he continues  with occasional breakthrough diarrhea, will likely prescribe dicyclomine or Colestid to use as needed prior to going out to eat.    Plan:  Start Benefiber 3 teaspoons daily x 2 weeks, then increase to twice daily. If skipping 2 or 3 days between bowel movements after 4 weeks of Benefiber, can start MiraLAX 17 g daily.  If he develops diarrhea with this, advised he can decrease to one half capful daily to every other day. Goal of at least 64 ounces of water daily. Avoid fried/fatty foods. Follow-up in 8 weeks.   Ermalinda Memos, PA-C [facility] 06/27/2023

## 2023-06-27 ENCOUNTER — Ambulatory Visit (INDEPENDENT_AMBULATORY_CARE_PROVIDER_SITE_OTHER): Payer: Medicare Other | Admitting: Gastroenterology

## 2023-06-27 ENCOUNTER — Encounter: Payer: Self-pay | Admitting: Gastroenterology

## 2023-06-27 VITALS — BP 135/83 | HR 64 | Temp 97.9°F | Ht 67.0 in | Wt 207.0 lb

## 2023-06-27 DIAGNOSIS — R198 Other specified symptoms and signs involving the digestive system and abdomen: Secondary | ICD-10-CM | POA: Diagnosis not present

## 2023-06-27 NOTE — Patient Instructions (Addendum)
Start benefiber 3 teaspoons daily x 2 weeks, then increase to twice daily.   If you are skipping 2-3 days between bowel movements after 4 weeks of benefiber, you can start MiraLAX 1 capful (17 g) daily in 8 oz of water. If you were to develop diarrhea with this, decrease to 1/2 capful daily or every other day.   Try to drink at least 64 ounces of water daily.  Try to avoid fried, fatty foods as this is likely contributing to your intermittent diarrhea as you do not have a gallbladder.  I will plan to see back in the office in about 8 weeks.  Do not hesitate to call sooner if you have questions or concerns.  It was great to see you again today!  Ermalinda Memos, PA-C [facility]

## 2023-07-04 ENCOUNTER — Ambulatory Visit (INDEPENDENT_AMBULATORY_CARE_PROVIDER_SITE_OTHER): Payer: Medicare Other | Admitting: Family Medicine

## 2023-07-04 ENCOUNTER — Encounter: Payer: Self-pay | Admitting: Family Medicine

## 2023-07-04 ENCOUNTER — Other Ambulatory Visit: Payer: Self-pay | Admitting: Family Medicine

## 2023-07-04 VITALS — BP 138/77 | HR 72 | Temp 97.7°F | Resp 20 | Ht 67.0 in | Wt 201.5 lb

## 2023-07-04 DIAGNOSIS — G4733 Obstructive sleep apnea (adult) (pediatric): Secondary | ICD-10-CM

## 2023-07-04 DIAGNOSIS — E1122 Type 2 diabetes mellitus with diabetic chronic kidney disease: Secondary | ICD-10-CM | POA: Diagnosis not present

## 2023-07-04 DIAGNOSIS — E1159 Type 2 diabetes mellitus with other circulatory complications: Secondary | ICD-10-CM

## 2023-07-04 DIAGNOSIS — E785 Hyperlipidemia, unspecified: Secondary | ICD-10-CM

## 2023-07-04 DIAGNOSIS — E1169 Type 2 diabetes mellitus with other specified complication: Secondary | ICD-10-CM

## 2023-07-04 DIAGNOSIS — N183 Chronic kidney disease, stage 3 unspecified: Secondary | ICD-10-CM

## 2023-07-04 DIAGNOSIS — N528 Other male erectile dysfunction: Secondary | ICD-10-CM

## 2023-07-04 DIAGNOSIS — I152 Hypertension secondary to endocrine disorders: Secondary | ICD-10-CM

## 2023-07-04 LAB — BAYER DCA HB A1C WAIVED: HB A1C (BAYER DCA - WAIVED): 7.9 % — ABNORMAL HIGH (ref 4.8–5.6)

## 2023-07-04 MED ORDER — BLOOD GLUCOSE MONITORING SUPPL DEVI: 1.0000 | Freq: Three times a day (TID) | 0 refills | Status: DC

## 2023-07-04 MED ORDER — BLOOD GLUCOSE TEST VI STRP: 1.0000 | ORAL_STRIP | Freq: Three times a day (TID) | 0 refills | Status: DC

## 2023-07-04 MED ORDER — LANCETS MISC. MISC: 1.0000 | Freq: Three times a day (TID) | 0 refills | Status: DC

## 2023-07-04 MED ORDER — LANCET DEVICE MISC: 1.0000 | Freq: Three times a day (TID) | 0 refills | Status: AC

## 2023-07-04 NOTE — Progress Notes (Signed)
Subjective:  Patient ID: Bryan Shepard., male    DOB: 1956/01/11, 67 y.o.   MRN: [id number]  Patient Care Team: Sonny Masters, FNP as PCP - General (Family Medicine) Oretha Milch, MD as Consulting Physician (Pulmonary Disease) Michaelle Copas, MD as Referring Physician (Optometry)   Chief Complaint:  Medical Management of Chronic Issues   HPI: Bryan Shepard. is a 67 y.o. male presenting on 07/04/2023 for Medical Management of Chronic Issues    1. Hyperlipidemia associated with type 2 diabetes mellitus (HCC) Has not been taking statin therapy. Has made some dietary changes but would like referral to a diabetic educator to help him better understand his meal planning.   2. CKD stage 3 due to type 2 diabetes mellitus (HCC) He is on ACEi but is not taking anything to control his blood sugars. Has made dietary changes. No changes in urinary output. No weakness or confusion.   3. Hypertension associated with type 2 diabetes mellitus (HCC) Complaint with meds - Yes Current Medications - lisinopril Checking BP at home - no Exercising Regularly - No Watching Salt intake - Yes Pertinent ROS:  Headache - No Fatigue - No Visual Disturbances - No Chest pain - No Dyspnea - No Palpitations - No LE edema - No They report good compliance with medications and can restate their regimen by memory. No medication side effects.  BP Readings from Last 3 Encounters:  07/04/23 138/77  06/27/23 135/83  04/02/23 136/83     4. Type 2 diabetes mellitus with other specified complication, without long-term current use of insulin (HCC) Has not been checking blood sugars and is not taking the metformin that was prescribed. He has made dietary changes but would like to see a diabetic educator. Long discussion about disease process and end organ damage associated with uncontrolled diabetes.   5. OSA (obstructive sleep apnea) Has not been wearing CPAP. [facility].  6. Other male erectile dysfunction States he is having a hard time getting an erection. Denies any other associated symptoms. Discussed possible causes such as uncontrolled diabetes. Pt would like referral to urology.      Relevant past medical, surgical, family, and social history reviewed and updated as indicated.  Allergies and medications reviewed and updated. Data reviewed: Chart in Epic.   Past Medical History:  Diagnosis Date   Acid reflux    Colon polyps    Diabetes mellitus type 2 in nonobese (HCC) 12/13/2020   Family history of genetic mutation for hereditary nonpolyposis colorectal cancer (HNPCC)    Family history of prostate cancer    Family history of stomach cancer    HOH (hard of hearing)    Hypertension    Sleep apnea    CPAP   Stroke (HCC) 2015   TIA (transient ischemic attack) 2016    Past Surgical History:  Procedure Laterality Date   CHOLECYSTECTOMY N/A 04/17/2020   Procedure: LAPAROSCOPIC CHOLECYSTECTOMY;  Surgeon: Franky Macho, MD;  Location: [facility];  Service: General;  Laterality: N/A;   COLONOSCOPY  2018   COLONOSCOPY WITH PROPOFOL N/A 12/07/2021   Surgeon: Earnest Bailey K, DO;   nonbleeding internal hemorrhoids, 8 mm polyp (tubular adenoma) in the descending colon removed.  Recommended 5-year surveillance.   POLYPECTOMY  12/07/2021   Procedure: POLYPECTOMY;  Surgeon: Lanelle Bal, DO;  Location: [facility];  Service: Endoscopy;;    Social History   Socioeconomic  History   Marital status: Married    Spouse name: [place]   Number of children: Not on file   Years of education: Not on file   Highest education level: Not on file  Occupational History   Occupation: retired     Comment: [employer]   Tobacco Use   Smoking status: Never   Smokeless tobacco: Never  Vaping Use   Vaping status: Never Used  Substance and Sexual Activity   Alcohol use: No   Drug use: No   Sexual activity: Yes    Birth control/protection: None   Other Topics Concern   Not on file  Social History Narrative   Married , 1 step daughter    Social Determinants of Health   Financial Resource Strain: Low Risk  (03/18/2023)   Overall Financial Resource Strain (CARDIA)    Difficulty of Paying Living Expenses: Not hard at all  Food Insecurity: No Food Insecurity (03/18/2023)   Hunger Vital Sign    Worried About Running Out of Food in the Last Year: Never true    Ran Out of Food in the Last Year: Never true  Transportation Needs: No Transportation Needs (03/18/2023)   PRAPARE - Administrator, Civil Service (Medical): No    Lack of Transportation (Non-Medical): No  Physical Activity: Insufficiently Active (03/18/2023)   Exercise Vital Sign    Days of Exercise per Week: 2 days    Minutes of Exercise per Session: 30 min  Stress: No Stress Concern Present (03/18/2023)   [employer] of Occupational Health - Occupational Stress Questionnaire    Feeling of Stress : Not at all  Social Connections: Socially Integrated (03/18/2023)   Social Connection and Isolation Panel [NHANES]    Frequency of Communication with Friends and Family: More than three times a week    Frequency of Social Gatherings with Friends and Family: More than three times a week    Attends Religious Services: More than 4 times per year    Active Member of [employer] or Organizations: Yes    Attends Engineer, structural: More than 4 times per year    Marital Status: Married  Catering manager Violence: Not At Risk (03/18/2023)   Humiliation, Afraid, Rape, and Kick questionnaire    Fear of Current or Ex-Partner: No    Emotionally Abused: No    Physically Abused: No    Sexually Abused: No    Outpatient Encounter Medications as of 07/04/2023  Medication Sig   Blood Glucose Monitoring Suppl DEVI 1 each by Does not apply route in the morning, at noon, and at bedtime. May substitute to any manufacturer covered by patient's insurance.   Glucose Blood (BLOOD  GLUCOSE TEST STRIPS) STRP 1 each by In Vitro route in the morning, at noon, and at bedtime. May substitute to any manufacturer covered by patient's insurance.   Lancet Device MISC 1 each by Does not apply route in the morning, at noon, and at bedtime. May substitute to any manufacturer covered by patient's insurance.   Lancets Misc. MISC 1 each by Does not apply route in the morning, at noon, and at bedtime. May substitute to any manufacturer covered by patient's insurance.   lisinopril (ZESTRIL) 10 MG tablet Take 1 tablet (10 mg total) by mouth daily.   No facility-administered encounter medications on file as of 07/04/2023.    No Known Allergies  Review of Systems  Constitutional:  Negative for activity change, appetite change, chills, diaphoresis, fatigue, fever and unexpected weight  change.  HENT: Negative.    Eyes: Negative.  Negative for photophobia and visual disturbance.  Respiratory:  Negative for cough, chest tightness and shortness of breath.   Cardiovascular:  Negative for chest pain, palpitations and leg swelling.  Gastrointestinal:  Negative for abdominal pain, blood in stool, constipation, diarrhea, nausea and vomiting.  Endocrine: Negative.   Genitourinary:  Negative for decreased urine volume, difficulty urinating, dysuria, enuresis, flank pain, frequency, genital sores, hematuria, penile discharge, penile pain, penile swelling, scrotal swelling, testicular pain and urgency.       ED  Musculoskeletal:  Negative for arthralgias and myalgias.  Skin: Negative.   Allergic/Immunologic: Negative.   Neurological:  Negative for dizziness, tremors, seizures, syncope, facial asymmetry, speech difficulty, weakness, light-headedness, numbness and headaches.  Hematological: Negative.   Psychiatric/Behavioral:  Negative for confusion, hallucinations, sleep disturbance and suicidal ideas.   All other systems reviewed and are negative.       Objective:  BP 138/77   Pulse 72   Temp  97.7 F (36.5 C) (Oral)   Resp 20   Ht 5\' 7"  (1.702 m)   Wt 201 lb 8 oz (91.4 kg)   SpO2 99%   BMI 31.56 kg/m    Wt Readings from Last 3 Encounters:  07/04/23 201 lb 8 oz (91.4 kg)  06/27/23 207 lb (93.9 kg)  04/02/23 203 lb 9.6 oz (92.4 kg)    Physical Exam Vitals and nursing note reviewed.  Constitutional:      General: He is not in acute distress.    Appearance: Normal appearance. He is well-developed and well-groomed. He is obese. He is not ill-appearing, toxic-appearing or diaphoretic.  HENT:     Head: Normocephalic and atraumatic.     Jaw: There is normal jaw occlusion.     Right Ear: Hearing normal.     Left Ear: Hearing normal.     Nose: Nose normal.     Mouth/Throat:     Lips: Pink.     Mouth: Mucous membranes are moist.     Pharynx: Oropharynx is clear. Uvula midline.  Eyes:     General: Lids are normal.     Extraocular Movements: Extraocular movements intact.     Conjunctiva/sclera: Conjunctivae normal.     Pupils: Pupils are equal, round, and reactive to light.  Neck:     Thyroid: No thyroid mass, thyromegaly or thyroid tenderness.     Vascular: No carotid bruit or JVD.     Trachea: Trachea and phonation normal.  Cardiovascular:     Rate and Rhythm: Normal rate and regular rhythm.     Chest Wall: PMI is not displaced.     Pulses: Normal pulses.     Heart sounds: Normal heart sounds. No murmur heard.    No friction rub. No gallop.  Pulmonary:     Effort: Pulmonary effort is normal. No respiratory distress.     Breath sounds: Normal breath sounds. No wheezing.  Abdominal:     General: Bowel sounds are normal. There is no abdominal bruit.     Palpations: Abdomen is soft. There is no hepatomegaly or splenomegaly.  Musculoskeletal:        General: Normal range of motion.     Cervical back: Normal range of motion and neck supple.     Right lower leg: No edema.     Left lower leg: No edema.  Lymphadenopathy:     Cervical: No cervical adenopathy.  Skin:     General: Skin is warm and  dry.     Capillary Refill: Capillary refill takes less than 2 seconds.     Coloration: Skin is not cyanotic, jaundiced or pale.     Findings: No rash.  Neurological:     General: No focal deficit present.     Mental Status: He is alert and oriented to person, place, and time.     Sensory: Sensation is intact.     Motor: Motor function is intact.     Coordination: Coordination is intact.     Gait: Gait is intact.     Deep Tendon Reflexes: Reflexes are normal and symmetric.  Psychiatric:        Attention and Perception: Attention and perception normal.        Mood and Affect: Mood and affect normal.        Speech: Speech normal.        Behavior: Behavior normal. Behavior is cooperative.        Thought Content: Thought content normal.        Cognition and Memory: Cognition and memory normal.        Judgment: Judgment normal.     Results for orders placed or performed in visit on 03/18/23  Celiac Disease Comprehensive Panel with Reflexes  Result Value Ref Range   Transglutaminase IgA <2 0 - 3 U/mL   IgA/Immunoglobulin A, Serum 147 61 - 437 mg/dL     EKG: SR 70, PR 161 ms, QT 400 ms, incomplete BBB, no acute ST-T changes. No changes from prior EKG. Kari Baars, FNP-C  Pertinent labs & imaging results that were available during my care of the patient were reviewed by me and considered in my medical decision making.  Assessment & Plan:  Jorryn was seen today for medical management of chronic issues.  Diagnoses and all orders for this visit:  Type 2 diabetes mellitus with other specified complication, without long-term current use of insulin (HCC) Discussed importance of disease management in detail due to end organ damage. Wants to await labs to start medications. Does not wish to start Metformin due to diarrhea. Labs pending. Referral to diabetic educator placed. EKG completed.  -     CMP14+EGFR -     Bayer DCA Hb A1c Waived -     Ambulatory referral  to diabetic education -     Blood Glucose Monitoring Suppl DEVI; 1 each by Does not apply route in the morning, at noon, and at bedtime. May substitute to any manufacturer covered by patient's insurance. -     Glucose Blood (BLOOD GLUCOSE TEST STRIPS) STRP; 1 each by In Vitro route in the morning, at noon, and at bedtime. May substitute to any manufacturer covered by patient's insurance. -     Lancet Device MISC; 1 each by Does not apply route in the morning, at noon, and at bedtime. May substitute to any manufacturer covered by patient's insurance. -     Lancets Misc. MISC; 1 each by Does not apply route in the morning, at noon, and at bedtime. May substitute to any manufacturer covered by patient's insurance. -     EKG 12-Lead -     EKG 12-Lead  Hyperlipidemia associated with type 2 diabetes mellitus (HCC) Not on statin. Will check labs today and discuss starting as this is important due to comorbid conditions. Diet and exercise encouraged.  -     CMP14+EGFR -     Lipid panel -     EKG 12-Lead  CKD stage 3 due  to type 2 diabetes mellitus (HCC) Labs pending. Would like to start [place] once labs result. -     CMP14+EGFR -     CBC with Differential/Platelet -     EKG 12-Lead  Hypertension associated with type 2 diabetes mellitus (HCC) BP well controlled. Changes were not made in regimen today. Goal BP is 130/80. Pt aware to report any persistent high or low readings. DASH diet and exercise encouraged. Exercise at least 150 minutes per week and increase as tolerated. Goal BMI > 25. Stress management encouraged. Avoid nicotine and tobacco product use. Avoid excessive alcohol and NSAID's. Avoid more than 2000 mg of sodium daily. Medications as prescribed. Follow up as scheduled.  -     CMP14+EGFR -     CBC with Differential/Platelet -     Lipid panel -     EKG 12-Lead -     EKG 12-Lead  OSA (obstructive sleep apnea) [facility].  -     EKG 12-Lead  Other male  erectile dysfunction Likely due to uncontrolled diabetes. Referral to urology placed.  -     Ambulatory referral to Urology -     EKG 12-Lead     Continue all other maintenance medications.  Follow up plan: Return in about 3 months (around 10/03/2023), or if symptoms worsen or fail to improve, for DM.   Continue healthy lifestyle choices, including diet (rich in fruits, vegetables, and lean proteins, and low in salt and simple carbohydrates) and exercise (at least 30 minutes of moderate physical activity daily).  Educational handout given for DM  The above assessment and management plan was discussed with the patient. The patient verbalized understanding of and has agreed to the management plan. Patient is aware to call the clinic if they develop any new symptoms or if symptoms persist or worsen. Patient is aware when to return to the clinic for a follow-up visit. Patient educated on when it is appropriate to go to the emergency department.   Kari Baars, FNP-C [facility] [phone]

## 2023-07-04 NOTE — Patient Instructions (Signed)

## 2023-07-05 LAB — LIPID PANEL
Chol/HDL Ratio: 5.9 {ratio} — ABNORMAL HIGH (ref 0.0–5.0)
Cholesterol, Total: 158 mg/dL (ref 100–199)
HDL: 27 mg/dL — ABNORMAL LOW (ref >39)
LDL Chol Calc (NIH): 102 mg/dL — ABNORMAL HIGH (ref 0–99)
Triglycerides: 166 mg/dL — ABNORMAL HIGH (ref 0–149)
VLDL Cholesterol Cal: 29 mg/dL (ref 5–40)

## 2023-07-05 LAB — CBC WITH DIFFERENTIAL/PLATELET
Basophils Absolute: 0.1 10*3/uL (ref 0.0–0.2)
Basos: 1 %
EOS (ABSOLUTE): 0.2 10*3/uL (ref 0.0–0.4)
Eos: 4 %
Hematocrit: 47.2 % (ref 37.5–51.0)
Hemoglobin: 15.5 g/dL (ref 13.0–17.7)
Immature Grans (Abs): 0 10*3/uL (ref 0.0–0.1)
Immature Granulocytes: 0 %
Lymphocytes Absolute: 1.9 10*3/uL (ref 0.7–3.1)
Lymphs: 28 %
MCH: 28.9 pg (ref 26.6–33.0)
MCHC: 32.8 g/dL (ref 31.5–35.7)
MCV: 88 fL (ref 79–97)
Monocytes Absolute: 0.5 10*3/uL (ref 0.1–0.9)
Monocytes: 8 %
Neutrophils Absolute: 3.9 10*3/uL (ref 1.4–7.0)
Neutrophils: 59 %
Platelets: 222 10*3/uL (ref 150–450)
RBC: 5.36 x10E6/uL (ref 4.14–5.80)
RDW: 12.8 % (ref 11.6–15.4)
WBC: 6.7 10*3/uL (ref 3.4–10.8)

## 2023-07-05 LAB — CMP14+EGFR
ALT: 46 [IU]/L — ABNORMAL HIGH (ref 0–44)
AST: 29 [IU]/L (ref 0–40)
Albumin: 4.6 g/dL (ref 3.9–4.9)
Alkaline Phosphatase: 75 [IU]/L (ref 44–121)
BUN/Creatinine Ratio: 14 (ref 10–24)
BUN: 17 mg/dL (ref 8–27)
Bilirubin Total: 0.4 mg/dL (ref 0.0–1.2)
CO2: 24 mmol/L (ref 20–29)
Calcium: 9.8 mg/dL (ref 8.6–10.2)
Chloride: 100 mmol/L (ref 96–106)
Creatinine, Ser: 1.25 mg/dL (ref 0.76–1.27)
Globulin, Total: 2.1 g/dL (ref 1.5–4.5)
Glucose: 155 mg/dL — ABNORMAL HIGH (ref 70–99)
Potassium: 4.5 mmol/L (ref 3.5–5.2)
Sodium: 137 mmol/L (ref 134–144)
Total Protein: 6.7 g/dL (ref 6.0–8.5)
eGFR: 64 mL/min/{1.73_m2} (ref >59)

## 2023-07-07 ENCOUNTER — Other Ambulatory Visit: Payer: Self-pay | Admitting: Family Medicine

## 2023-07-07 DIAGNOSIS — E1169 Type 2 diabetes mellitus with other specified complication: Secondary | ICD-10-CM

## 2023-07-07 MED ORDER — EMPAGLIFLOZIN 10 MG PO TABS
10.0000 mg | ORAL_TABLET | Freq: Every day | ORAL | 6 refills | Status: DC
Start: 2023-07-07 — End: 2023-08-08

## 2023-07-07 NOTE — Addendum Note (Signed)
Addended by: Sonny Masters on: 07/07/2023 07:30 PM   Modules accepted: Orders

## 2023-07-11 ENCOUNTER — Telehealth: Payer: Self-pay | Admitting: Family Medicine

## 2023-07-11 NOTE — Telephone Encounter (Signed)
1-2 time per day, more if symptomatic

## 2023-07-11 NOTE — Telephone Encounter (Signed)
Wife wants to know when PCP would like pt to start checking his sugar with the test strips and what time of day is best?

## 2023-07-11 NOTE — Telephone Encounter (Signed)
Wife aware and verbalizes understanding per dpr.  States that patient is going to wait until he goes to dietician next month.

## 2023-08-07 NOTE — Progress Notes (Signed)
Referring Provider: Sonny Masters, FNP Primary Care Physician:  Sonny Masters, FNP Primary GI Physician: Dr. Marletta Lor  Chief Complaint  Patient presents with   Follow-up    Follow up. No problem    HPI:   Bryan Shepard. is a 67 y.o. male  with a history of diabetes, HTN, stroke/TIA, reflux, family history of Lynch syndrome, personal history colon polyps, presenting today for follow-up of alternating constipation and diarrhea.   Colonoscopy 12/07/2021 with nonbleeding internal hemorrhoids, 8 mm polyp (tubular adenoma) in the descending colon removed.  Recommended 5-year surveillance.   TSH within normal limits and celiac screen negative earlier this year.  Last seen in the office 06/27/2023.  He had not started Benefiber or MiraLAX as previously recommended due to concerns about having diarrhea while on vacation or at a funeral.  Continue to alternate between constipation and diarrhea though he felt that his bowels were moving fairly normally daily to every other day, but still with breakthrough diarrhea specifically remembering this happening after eating hush puppies, barbecue chicken wrap, fries, original chicken sandwich. Queried intermittent overflow diarrhea versus component of bile salt diarrhea contributing.  Recommended Benefiber daily and adding MiraLAX if needed. If bowel regularity improves, but he continues with occasional breakthrough diarrhea, would consider dicyclomine or Colestid to use as needed prior to going out to eat.   Today:  Doing better overall.  He started Benefiber once a day.  Has not increased it to twice a day yet.  Has only had 1 episode of diarrhea since his last visit. Had some chocolate milk on an empty stomach. Bowels moving daily to every 2 to 3 days.  No abdominal pain, BRBPR, melena.  No other GI concerns.  Past Medical History:  Diagnosis Date   Acid reflux    Colon polyps    Diabetes mellitus type 2 in nonobese (HCC) 12/13/2020   Family history  of genetic mutation for hereditary nonpolyposis colorectal cancer (HNPCC)    Family history of prostate cancer    Family history of stomach cancer    HOH (hard of hearing)    Hypertension    Sleep apnea    CPAP   Stroke (HCC) 2015   TIA (transient ischemic attack) 2016    Past Surgical History:  Procedure Laterality Date   CHOLECYSTECTOMY N/A 04/17/2020   Procedure: LAPAROSCOPIC CHOLECYSTECTOMY;  Surgeon: Franky Macho, MD;  Location: [facility];  Service: General;  Laterality: N/A;   COLONOSCOPY  2018   COLONOSCOPY WITH PROPOFOL N/A 12/07/2021   Surgeon: Earnest Bailey K, DO;   nonbleeding internal hemorrhoids, 8 mm polyp (tubular adenoma) in the descending colon removed.  Recommended 5-year surveillance.   POLYPECTOMY  12/07/2021   Procedure: POLYPECTOMY;  Surgeon: Lanelle Bal, DO;  Location: [facility];  Service: Endoscopy;;    Current Outpatient Medications  Medication Sig Dispense Refill   lisinopril (ZESTRIL) 10 MG tablet Take 1 tablet (10 mg total) by mouth daily. 90 tablet 2   Accu-Chek FastClix Lancets MISC CHECK BS DAILY DX E11.9 (Patient not taking: Reported on 08/08/2023) 100 each 3   Blood Glucose Monitoring Suppl (ACCU-CHEK GUIDE ME) w/Device KIT CHECK BS DAILY DX E11.9 (Patient not taking: Reported on 08/08/2023) 1 kit 0   glucose blood (FREESTYLE LITE) test strip Check BS daily Dx E11.9 (Patient not taking: Reported on 08/08/2023) 100 strip 3   No current facility-administered medications for this visit.    Allergies as of 08/08/2023   (No  Known Allergies)    Family History  Problem Relation Age of Onset   Melanoma Mother 34   Hypertension Mother    Transient ischemic attack Mother    Stroke Mother    Heart disease Mother    Prostate cancer Father 52   Stomach cancer Father 64       PMS2+; Lynch syndrome   Melanoma Father    Diabetes Father    Heart disease Father    Stroke Maternal Grandmother    Lung cancer Paternal Grandmother    Throat cancer  Paternal Grandmother    Prostate cancer Paternal Grandfather 55   Bladder Cancer Other        MGM's mother with urethra cancer    Social History   Socioeconomic History   Marital status: Married    Spouse name: [place]   Number of children: Not on file   Years of education: Not on file   Highest education level: Not on file  Occupational History   Occupation: retired     Comment: [employer]   Tobacco Use   Smoking status: Never   Smokeless tobacco: Never  Vaping Use   Vaping status: Never Used  Substance and Sexual Activity   Alcohol use: No   Drug use: No   Sexual activity: Yes    Birth control/protection: None  Other Topics Concern   Not on file  Social History Narrative   Married , 1 step daughter    Social Determinants of Health   Financial Resource Strain: Low Risk  (03/18/2023)   Overall Financial Resource Strain (CARDIA)    Difficulty of Paying Living Expenses: Not hard at all  Food Insecurity: No Food Insecurity (03/18/2023)   Hunger Vital Sign    Worried About Running Out of Food in the Last Year: Never true    Ran Out of Food in the Last Year: Never true  Transportation Needs: No Transportation Needs (03/18/2023)   PRAPARE - Administrator, Civil Service (Medical): No    Lack of Transportation (Non-Medical): No  Physical Activity: Insufficiently Active (03/18/2023)   Exercise Vital Sign    Days of Exercise per Week: 2 days    Minutes of Exercise per Session: 30 min  Stress: No Stress Concern Present (03/18/2023)   [employer] of Occupational Health - Occupational Stress Questionnaire    Feeling of Stress : Not at all  Social Connections: Socially Integrated (03/18/2023)   Social Connection and Isolation Panel [NHANES]    Frequency of Communication with Friends and Family: More than three times a week    Frequency of Social Gatherings with Friends and Family: More than three times a week    Attends Religious Services: More than 4 times per year     Active Member of [employer] or Organizations: Yes    Attends Engineer, structural: More than 4 times per year    Marital Status: Married    Review of Systems: Gen: Denies fever, chills, cold or flulike symptoms, presyncope, syncope. GI: See HPI Heme: See HPI  Physical Exam: BP 138/81 (BP Location: Right Arm, Patient Position: Sitting, Cuff Size: Normal)   Pulse 70   Temp 97.7 F (36.5 C) (Temporal)   Ht 5\' 7"  (1.702 m)   Wt 202 lb 6.4 oz (91.8 kg)   BMI 31.70 kg/m  General:   Alert and oriented. No distress noted. Pleasant and cooperative.  Head:  Normocephalic and atraumatic. Abdomen:  +BS, soft, non-tender and non-distended. No rebound or  guarding.  Psych:  Normal mood and affect.   Assessment:  67 y.o. male  with a history of diabetes, HTN, stroke/TIA, reflux, family history of Lynch syndrome, personal history colon polyps, presenting today for follow-up of alternating constipation and diarrhea.  Symptoms are overall much improved with the addition of Benefiber daily.  He does still continue to skip a couple of days between bowel movements at times, but has had significant decrease in the intermittent diarrhea. No alarm symptoms.   Plan:  Increase Benefiber to twice daily. After 2 to 4 weeks, if still feeling mildly constipated, may add MiraLAX 17 g daily.  Advised that he can titrate the dose of MiraLAX as needed if taking every day is too strong. Follow-up in 6 months or sooner if needed.   Ermalinda Memos, PA-C [facility] 08/08/2023

## 2023-08-08 ENCOUNTER — Ambulatory Visit (INDEPENDENT_AMBULATORY_CARE_PROVIDER_SITE_OTHER): Payer: Medicare Other | Admitting: Gastroenterology

## 2023-08-08 ENCOUNTER — Encounter: Payer: Self-pay | Admitting: Gastroenterology

## 2023-08-08 VITALS — BP 138/81 | HR 70 | Temp 97.7°F | Ht 67.0 in | Wt 202.4 lb

## 2023-08-08 DIAGNOSIS — R194 Change in bowel habit: Secondary | ICD-10-CM

## 2023-08-08 DIAGNOSIS — R198 Other specified symptoms and signs involving the digestive system and abdomen: Secondary | ICD-10-CM

## 2023-08-08 NOTE — Patient Instructions (Addendum)
Go ahead and increase Benefiber to twice daily to see if this will help you to have productive bowel movements daily to every other day.  After 2 to 4 weeks of taking Benefiber twice a day, if you still feel some degree of constipation, you can add MiraLAX 17 g mixed in 8 ounces of water or other noncarbonated beverage of your choice daily.  This is too strong, you can decrease to one half dose or try every other day.   I will plan to see back in 6 months or sooner if needed.  It was great to see you again today!  Have a Happy Thanksgiving and Altamese Cabal Christmas!  Ermalinda Memos, PA-C [facility]

## 2023-08-11 ENCOUNTER — Telehealth: Payer: Self-pay | Admitting: Family Medicine

## 2023-08-20 ENCOUNTER — Encounter: Payer: Medicare Other | Attending: Family Medicine | Admitting: Nutrition

## 2023-08-20 ENCOUNTER — Encounter: Payer: Self-pay | Admitting: Nutrition

## 2023-08-20 DIAGNOSIS — E1169 Type 2 diabetes mellitus with other specified complication: Secondary | ICD-10-CM

## 2023-08-20 DIAGNOSIS — Z713 Dietary counseling and surveillance: Secondary | ICD-10-CM | POA: Diagnosis not present

## 2023-08-20 DIAGNOSIS — E1165 Type 2 diabetes mellitus with hyperglycemia: Secondary | ICD-10-CM | POA: Diagnosis present

## 2023-08-20 DIAGNOSIS — E66811 Obesity, class 1: Secondary | ICD-10-CM

## 2023-08-20 DIAGNOSIS — E0800 Diabetes mellitus due to underlying condition with hyperosmolarity without nonketotic hyperglycemic-hyperosmolar coma (NKHHC): Secondary | ICD-10-CM

## 2023-08-20 DIAGNOSIS — N183 Chronic kidney disease, stage 3 unspecified: Secondary | ICD-10-CM

## 2023-08-20 NOTE — Patient Instructions (Signed)
Goals  Drink a gallon of watar per day Eat meals on time Increase fruits, vegetables and whole grains. Don't eat after 7 pm Walk 30 minutes 3 times per week and keep doing Test bloods sugars 3 times per day and record on sheet and bring to all appts.

## 2023-08-20 NOTE — Progress Notes (Signed)
Medical Nutrition Therapy  Appointment Start time:  0800  Appointment End time:  0900  Primary concerns today: Dm Type 2  Referral diagnosis: E11.65 Preferred learning style: NO Preference Learning readiness: Ready    NUTRITION ASSESSMENT  67 yr old while male referred for Type 2 DM from Gilford Silvius, FNP. A1C 7.9%. He is not taking any medications at present. Will consider going back on Metformin. He wants to improve his diet and exercise and lose weight to reverse his DM.  He is willing to work on Lifestyle Medicine to improve  his DM and reduce risks of health issues with focusing on more whole plant based foods and 6 pillars of health.  Clinical Medical Hx: Past Medical History:  Diagnosis Date   Acid reflux    Colon polyps    Diabetes mellitus type 2 in nonobese (HCC) 12/13/2020   Family history of genetic mutation for hereditary nonpolyposis colorectal cancer (HNPCC)    Family history of prostate cancer    Family history of stomach cancer    HOH (hard of hearing)    Hypertension    Sleep apnea    CPAP   Stroke (HCC) 2015   TIA (transient ischemic attack) 2016    Medications:  Current Outpatient Medications on File Prior to Visit  Medication Sig Dispense Refill   Accu-Chek FastClix Lancets MISC CHECK BS DAILY DX E11.9 (Patient not taking: Reported on 08/08/2023) 100 each 3   Blood Glucose Monitoring Suppl (ACCU-CHEK GUIDE ME) w/Device KIT CHECK BS DAILY DX E11.9 (Patient not taking: Reported on 08/08/2023) 1 kit 0   glucose blood (FREESTYLE LITE) test strip Check BS daily Dx E11.9 (Patient not taking: Reported on 08/08/2023) 100 strip 3   lisinopril (ZESTRIL) 10 MG tablet Take 1 tablet (10 mg total) by mouth daily. 90 tablet 2   No current facility-administered medications on file prior to visit.    Labs:  Lab Results  Component Value Date   HGBA1C 7.9 (H) 07/04/2023      Latest Ref Rng & Units 07/04/2023   12:18 PM 01/01/2023   10:49 AM 10/01/2022   11:13 AM  CMP   Glucose 70 - 99 mg/dL 161  096  045   BUN 8 - 27 mg/dL 17  14  14    Creatinine 0.76 - 1.27 mg/dL 4.09  8.11  9.14   Sodium 134 - 144 mmol/L 137  143  139   Potassium 3.5 - 5.2 mmol/L 4.5  4.4  4.7   Chloride 96 - 106 mmol/L 100  104  100   CO2 20 - 29 mmol/L 24  23  26    Calcium 8.6 - 10.2 mg/dL 9.8  9.5  9.9   Total Protein 6.0 - 8.5 g/dL 6.7  6.4  6.7   Total Bilirubin 0.0 - 1.2 mg/dL 0.4  0.4  0.5   Alkaline Phos 44 - 121 IU/L 75  70  83   AST 0 - 40 IU/L 29  19  27    ALT 0 - 44 IU/L 46  29  46    Lipid Panel     Component Value Date/Time   CHOL 158 07/04/2023 1218   TRIG 166 (H) 07/04/2023 1218   HDL 27 (L) 07/04/2023 1218   CHOLHDL 5.9 (H) 07/04/2023 1218   LDLCALC 102 (H) 07/04/2023 1218   LABVLDL 29 07/04/2023 1218    Notable Signs/Symptoms: Fatigue, increased hunger  Lifestyle & Dietary Hx LIves with his wife.  Eats 2-3 meals  per day  Estimated daily fluid intake: 40 oz Supplements:  Sleep:  Stress / self-care:  Current average weekly physical activity: Walks a little  24-Hr Dietary Recall Eats 2-3 meals per day.  Meals cooked at home usually. Eats between meals or evening snacks at times.   Estimated Energy Needs Calories: 1600 Carbohydrate: 180g Protein: 120g Fat: 44g   NUTRITION DIAGNOSIS  NB-1.1 Food and nutrition-related knowledge deficit As related to Diabetes.  As evidenced by A1C 7.6%.   NUTRITION INTERVENTION  Nutrition education (E-1) on the following topics:  Nutrition and Diabetes education provided on My Plate, CHO counting, meal planning, portion sizes, timing of meals, avoiding snacks between meals unless having a low blood sugar, target ranges for A1C and blood sugars, signs/symptoms and treatment of hyper/hypoglycemia, monitoring blood sugars, taking medications as prescribed, benefits of exercising 30 minutes per day and prevention of complications of DM.  Lifestyle Medicine  - Whole Food, Plant Predominant Nutrition is highly  recommended: Eat Plenty of vegetables, Mushrooms, fruits, Legumes, Whole Grains, Nuts, seeds in lieu of processed meats, processed snacks/pastries red meat, poultry, eggs.    -It is better to avoid simple carbohydrates including: Cakes, Sweet Desserts, Ice Cream, Soda (diet and regular), Sweet Tea, Candies, Chips, Cookies, Store Bought Juices, Alcohol in Excess of  1-2 drinks a day, Lemonade,  Artificial Sweeteners, Doughnuts, Coffee Creamers, "Sugar-free" Products, etc, etc.  This is not a complete list.....  Exercise: If you are able: 30 -60 minutes a day ,4 days a week, or 150 minutes a week.  The longer the better.  Combine stretch, strength, and aerobic activities.  If you were told in the past that you have high risk for cardiovascular diseases, you may seek evaluation by your heart doctor prior to initiating moderate to intense exercise programs.   Handouts Provided Include  Know your numbers Lifestyle Medicine handouts.   Learning Style & Readiness for Change Teaching method utilized: Visual & Auditory  Demonstrated degree of understanding via: Teach Back  Barriers to learning/adherence to lifestyle change: NOne  Goals Established by Pt Goals  Drink a gallon of watar per day Eat meals on time Increase fruits, vegetables and whole grains. Don't eat after 7 pm Walk 30 minutes 3 times per week and keep doing Test bloods sugars 3 times per day and record on sheet and bring to all appts.   MONITORING & EVALUATION Dietary intake, weekly physical activity, and BS in 1-2 months.  Next Steps  Patient is to work on meal planning, meal prepping and avoiding snacks.Marland Kitchen

## 2023-09-02 ENCOUNTER — Encounter: Payer: Self-pay | Admitting: Nutrition

## 2023-09-08 ENCOUNTER — Encounter: Payer: Medicare Other | Attending: Family Medicine | Admitting: Nutrition

## 2023-09-08 ENCOUNTER — Encounter: Payer: Self-pay | Admitting: Nutrition

## 2023-09-08 VITALS — Ht 67.0 in | Wt 200.0 lb

## 2023-09-08 DIAGNOSIS — E1165 Type 2 diabetes mellitus with hyperglycemia: Secondary | ICD-10-CM | POA: Insufficient documentation

## 2023-09-08 DIAGNOSIS — Z713 Dietary counseling and surveillance: Secondary | ICD-10-CM | POA: Diagnosis not present

## 2023-09-08 DIAGNOSIS — E1122 Type 2 diabetes mellitus with diabetic chronic kidney disease: Secondary | ICD-10-CM

## 2023-09-08 DIAGNOSIS — E66811 Obesity, class 1: Secondary | ICD-10-CM

## 2023-09-08 DIAGNOSIS — E1169 Type 2 diabetes mellitus with other specified complication: Secondary | ICD-10-CM

## 2023-09-08 DIAGNOSIS — E0800 Diabetes mellitus due to underlying condition with hyperosmolarity without nonketotic hyperglycemic-hyperosmolar coma (NKHHC): Secondary | ICD-10-CM

## 2023-09-08 NOTE — Progress Notes (Unsigned)
Medical Nutrition Therapy  Appointment Start time:  0800  Appointment End time:  0830  Primary concerns today: Dm Type 2  Referral diagnosis: E11.65 Preferred learning style: NO Preference Learning readiness: Ready    NUTRITION ASSESSMENT Follow up  67 yr old while male referred for Type 2 DM from Gilford Silvius, FNP. Goes to see his PCP Dec 27th and will get A1C done then. FBS:150-160's. Has had a hard time getting blood out of fingers. Reviewed technique Making slow steady changes with diet Getting to work out twice a week. Lost 2 lbs. Feels better. Diet controlled Type 2 DM. Hyperlipidemia-discussed need for high fiber diet and low saturated fats; cut out fast food and processed foods. Needs to get LDL less thn 70 and HDL up to 45. Eat more whole plant based foods.   Goals set previously  Drink a gallon of watar per day-improved. Eat meals on time-working on it but better overall. Increase fruits, vegetables and whole grains.-iimproved. Don't eat after 7 pm-working on it. Walk 30 minutes 3 times per week - goes to [facility] reps twice a week. Test bloods sugars 3 times per day and record on sheet and bring to all appts.-did ok but had problems getting blood out to test BS.  He is willing to work on Lifestyle Medicine to improve  his DM and reduce risks of health issues with focusing on more whole plant based foods and 6 pillars of health.  Clinical Medical Hx: Past Medical History:  Diagnosis Date   Acid reflux    Colon polyps    Diabetes mellitus type 2 in nonobese (HCC) 12/13/2020   Family history of genetic mutation for hereditary nonpolyposis colorectal cancer (HNPCC)    Family history of prostate cancer    Family history of stomach cancer    HOH (hard of hearing)    Hypertension    Sleep apnea    CPAP   Stroke (HCC) 2015   TIA (transient ischemic attack) 2016    Medications:  Current Outpatient Medications on File Prior to Visit  Medication Sig Dispense Refill    Accu-Chek FastClix Lancets MISC CHECK BS DAILY DX E11.9 (Patient not taking: Reported on 08/08/2023) 100 each 3   Blood Glucose Monitoring Suppl (ACCU-CHEK GUIDE ME) w/Device KIT CHECK BS DAILY DX E11.9 (Patient not taking: Reported on 08/08/2023) 1 kit 0   glucose blood (FREESTYLE LITE) test strip Check BS daily Dx E11.9 (Patient not taking: Reported on 08/08/2023) 100 strip 3   lisinopril (ZESTRIL) 10 MG tablet Take 1 tablet (10 mg total) by mouth daily. 90 tablet 2   No current facility-administered medications on file prior to visit.    Labs:  Lab Results  Component Value Date   HGBA1C 7.9 (H) 07/04/2023      Latest Ref Rng & Units 07/04/2023   12:18 PM 01/01/2023   10:49 AM 10/01/2022   11:13 AM  CMP  Glucose 70 - 99 mg/dL 147  829  562   BUN 8 - 27 mg/dL 17  14  14    Creatinine 0.76 - 1.27 mg/dL 1.30  8.65  7.84   Sodium 134 - 144 mmol/L 137  143  139   Potassium 3.5 - 5.2 mmol/L 4.5  4.4  4.7   Chloride 96 - 106 mmol/L 100  104  100   CO2 20 - 29 mmol/L 24  23  26    Calcium 8.6 - 10.2 mg/dL 9.8  9.5  9.9   Total Protein 6.0 -  8.5 g/dL 6.7  6.4  6.7   Total Bilirubin 0.0 - 1.2 mg/dL 0.4  0.4  0.5   Alkaline Phos 44 - 121 IU/L 75  70  83   AST 0 - 40 IU/L 29  19  27    ALT 0 - 44 IU/L 46  29  46    Lipid Panel     Component Value Date/Time   CHOL 158 07/04/2023 1218   TRIG 166 (H) 07/04/2023 1218   HDL 27 (L) 07/04/2023 1218   CHOLHDL 5.9 (H) 07/04/2023 1218   LDLCALC 102 (H) 07/04/2023 1218   LABVLDL 29 07/04/2023 1218   Wt Readings from Last 3 Encounters:  09/08/23 200 lb (90.7 kg)  08/08/23 202 lb 6.4 oz (91.8 kg)  07/04/23 201 lb 8 oz (91.4 kg)   Ht Readings from Last 3 Encounters:  09/08/23 5\' 7"  (1.702 m)  08/08/23 5\' 7"  (1.702 m)  07/04/23 5\' 7"  (1.702 m)   Body mass index is 31.32 kg/m. @BMIFA @ Facility age limit for growth %iles is 20 years. Facility age limit for growth %iles is 20 years.   Notable Signs/Symptoms: Fatigue, increased  hunger  Lifestyle & Dietary Hx LIves with his wife.  Eats 2-3 meals per day  Estimated daily fluid intake: 40 oz Supplements:  Sleep:  Stress / self-care:  Current average weekly physical activity: Walks a little  24-Hr Dietary Recall B) Packaged oatmeal.6 crackers,  Milk, water L) Leftovers from Thanksgiving, water D) 830 pm 2 wendy hamburgers, diet soda   Estimated Energy Needs Calories: 1600 Carbohydrate: 180g Protein: 120g Fat: 44g   NUTRITION DIAGNOSIS  NB-1.1 Food and nutrition-related knowledge deficit As related to Diabetes.  As evidenced by A1C 7.6%.   NUTRITION INTERVENTION  Nutrition education (E-1) on the following topics:  Nutrition and Diabetes education provided on My Plate, CHO counting, meal planning, portion sizes, timing of meals, avoiding snacks between meals unless having a low blood sugar, target ranges for A1C and blood sugars, signs/symptoms and treatment of hyper/hypoglycemia, monitoring blood sugars, taking medications as prescribed, benefits of exercising 30 minutes per day and prevention of complications of DM.  Lifestyle Medicine  - Whole Food, Plant Predominant Nutrition is highly recommended: Eat Plenty of vegetables, Mushrooms, fruits, Legumes, Whole Grains, Nuts, seeds in lieu of processed meats, processed snacks/pastries red meat, poultry, eggs.    -It is better to avoid simple carbohydrates including: Cakes, Sweet Desserts, Ice Cream, Soda (diet and regular), Sweet Tea, Candies, Chips, Cookies, Store Bought Juices, Alcohol in Excess of  1-2 drinks a day, Lemonade,  Artificial Sweeteners, Doughnuts, Coffee Creamers, "Sugar-free" Products, etc, etc.  This is not a complete list.....  Exercise: If you are able: 30 -60 minutes a day ,4 days a week, or 150 minutes a week.  The longer the better.  Combine stretch, strength, and aerobic activities.  If you were told in the past that you have high risk for cardiovascular diseases, you may seek  evaluation by your heart doctor prior to initiating moderate to intense exercise programs.   Handouts Provided Include  Know your numbers Lifestyle Medicine handouts.   Learning Style & Readiness for Change Teaching method utilized: Visual & Auditory  Demonstrated degree of understanding via: Teach Back  Barriers to learning/adherence to lifestyle change: NOne  Goals Established by Pt    MONITORING & EVALUATION Dietary intake, weekly physical activity, and BS in 3-4 months.  Next Steps  Patient is to work on meal planning, meal prepping and  avoiding snacks.Marland Kitchen

## 2023-09-08 NOTE — Patient Instructions (Signed)
Goals  Work on technique for checking blood sugar Try adding old fashion oats instead of processed packaged oatmeal Goal to get in 150 mins of exercise per week. Get A1C down to to 7%.

## 2023-09-10 ENCOUNTER — Encounter: Payer: Self-pay | Admitting: Family Medicine

## 2023-09-25 LAB — HM DIABETES EYE EXAM

## 2023-10-03 ENCOUNTER — Encounter: Payer: Self-pay | Admitting: Family Medicine

## 2023-10-03 ENCOUNTER — Ambulatory Visit (INDEPENDENT_AMBULATORY_CARE_PROVIDER_SITE_OTHER): Payer: Medicare Other | Admitting: Family Medicine

## 2023-10-03 VITALS — BP 135/74 | HR 67 | Temp 97.6°F | Ht 67.0 in | Wt 201.0 lb

## 2023-10-03 DIAGNOSIS — G8929 Other chronic pain: Secondary | ICD-10-CM | POA: Insufficient documentation

## 2023-10-03 DIAGNOSIS — E1169 Type 2 diabetes mellitus with other specified complication: Secondary | ICD-10-CM

## 2023-10-03 DIAGNOSIS — E1122 Type 2 diabetes mellitus with diabetic chronic kidney disease: Secondary | ICD-10-CM | POA: Diagnosis not present

## 2023-10-03 DIAGNOSIS — E1159 Type 2 diabetes mellitus with other circulatory complications: Secondary | ICD-10-CM

## 2023-10-03 DIAGNOSIS — E785 Hyperlipidemia, unspecified: Secondary | ICD-10-CM | POA: Diagnosis not present

## 2023-10-03 DIAGNOSIS — Z7984 Long term (current) use of oral hypoglycemic drugs: Secondary | ICD-10-CM

## 2023-10-03 DIAGNOSIS — N183 Chronic kidney disease, stage 3 unspecified: Secondary | ICD-10-CM

## 2023-10-03 LAB — BAYER DCA HB A1C WAIVED: HB A1C (BAYER DCA - WAIVED): 8.2 % — ABNORMAL HIGH (ref 4.8–5.6)

## 2023-10-03 MED ORDER — ENALAPRIL MALEATE 2.5 MG PO TABS
2.5000 mg | ORAL_TABLET | Freq: Every day | ORAL | 1 refills | Status: DC
Start: 2023-10-03 — End: 2024-04-06

## 2023-10-03 MED ORDER — METFORMIN HCL 500 MG PO TABS: 500.0000 mg | ORAL_TABLET | Freq: Every day | ORAL | 3 refills | Status: DC

## 2023-10-03 NOTE — Progress Notes (Signed)
Subjective:  Patient ID: Bryan Jensen., male    DOB: 05/20/1956, 67 y.o.   MRN: [id number]  Patient Care Team: Sonny Masters, FNP as PCP - General (Family Medicine) Oretha Milch, MD as Consulting Physician (Pulmonary Disease) Michaelle Copas, MD as Referring Physician (Optometry)   Chief Complaint:  Diabetes (3 month follow up )   HPI: Bryan Pretty. is a 67 y.o. male presenting on 10/03/2023 for Diabetes (3 month follow up )   Discussed the use of AI scribe software for clinical note transcription with the patient, who gave verbal consent to proceed.  History of Present Illness   The patient, a 67 year old with a history of hypertension, diabetes, and chronic diarrhea, presents with concerns about the side effects of Lisinopril, which they have been taking for blood pressure control. The patient reports experiencing a runny nose and believes the medication may be affecting their erectile dysfunction disorder. They express a desire to discontinue Lisinopril, and their blood pressure readings at home have been consistently low.  The patient has been adhering to a regimen of daily aspirin (81mg ) and various supplements, although they express skepticism about the efficacy of the latter. They plan to finish their current supply of supplements and then discontinue them.  The patient has been monitoring their blood sugar levels, which have been around 152 in the mornings. They have made significant dietary changes, including eliminating soda and sweets, and incorporating more fiber-rich foods. They have also been exercising twice a week with a personal trainer, focusing on improving flexibility and stamina.  Despite these lifestyle changes, the patient's HbA1c levels have increased from 7.8 to 8.2. They previously discontinued Metformin but are considering restarting it at a lower dose.  The patient also reports chronic diarrhea, which has improved recently. They have been taking  Benefiber for this issue, which they initially feared would exacerbate their symptoms but has instead helped to regulate them. They also report numbness and tingling in their toes, which they attribute to wearing combat boots for many years. They have found relief from a foot massager, which they use daily.          Relevant past medical, surgical, family, and social history reviewed and updated as indicated.  Allergies and medications reviewed and updated. Data reviewed: Chart in Epic.   Past Medical History:  Diagnosis Date   Acid reflux    Colon polyps    Diabetes mellitus type 2 in nonobese (HCC) 12/13/2020   Family history of genetic mutation for hereditary nonpolyposis colorectal cancer (HNPCC)    Family history of prostate cancer    Family history of stomach cancer    HOH (hard of hearing)    Hypertension    Sleep apnea    CPAP   Stroke (HCC) 2015   TIA (transient ischemic attack) 2016    Past Surgical History:  Procedure Laterality Date   CHOLECYSTECTOMY N/A 04/17/2020   Procedure: LAPAROSCOPIC CHOLECYSTECTOMY;  Surgeon: Franky Macho, MD;  Location: [facility];  Service: General;  Laterality: N/A;   COLONOSCOPY  2018   COLONOSCOPY WITH PROPOFOL N/A 12/07/2021   Surgeon: Earnest Bailey K, DO;   nonbleeding internal hemorrhoids, 8 mm polyp (tubular adenoma) in the descending colon removed.  Recommended 5-year surveillance.   POLYPECTOMY  12/07/2021   Procedure: POLYPECTOMY;  Surgeon: Lanelle Bal, DO;  Location: [facility];  Service: Endoscopy;;    Social History   Socioeconomic History   Marital  status: Married    Spouse name: [place]   Number of children: Not on file   Years of education: Not on file   Highest education level: Bachelor's degree (e.g., BA, AB, BS)  Occupational History   Occupation: retired     Comment: [employer]   Tobacco Use   Smoking status: Never   Smokeless tobacco: Never  Vaping Use   Vaping status: Never Used  Substance and Sexual  Activity   Alcohol use: No   Drug use: No   Sexual activity: Yes    Birth control/protection: None  Other Topics Concern   Not on file  Social History Narrative   Married , 1 step daughter    Social Drivers of Corporate investment banker Strain: Low Risk  (03/18/2023)   Overall Financial Resource Strain (CARDIA)    Difficulty of Paying Living Expenses: Not hard at all  Food Insecurity: No Food Insecurity (10/02/2023)   Hunger Vital Sign    Worried About Running Out of Food in the Last Year: Never true    Ran Out of Food in the Last Year: Never true  Transportation Needs: No Transportation Needs (10/02/2023)   PRAPARE - Administrator, Civil Service (Medical): No    Lack of Transportation (Non-Medical): No  Physical Activity: Insufficiently Active (10/02/2023)   Exercise Vital Sign    Days of Exercise per Week: 2 days    Minutes of Exercise per Session: 30 min  Stress: No Stress Concern Present (10/02/2023)   [employer] of Occupational Health - Occupational Stress Questionnaire    Feeling of Stress : Not at all  Social Connections: Socially Integrated (10/02/2023)   Social Connection and Isolation Panel [NHANES]    Frequency of Communication with Friends and Family: Three times a week    Frequency of Social Gatherings with Friends and Family: Three times a week    Attends Religious Services: More than 4 times per year    Active Member of Clubs or Organizations: Yes    Attends Banker Meetings: More than 4 times per year    Marital Status: Married  Catering manager Violence: Not At Risk (03/18/2023)   Humiliation, Afraid, Rape, and Kick questionnaire    Fear of Current or Ex-Partner: No    Emotionally Abused: No    Physically Abused: No    Sexually Abused: No    Outpatient Encounter Medications as of 10/03/2023  Medication Sig   Accu-Chek FastClix Lancets MISC CHECK BS DAILY DX E11.9   Blood Glucose Monitoring Suppl (ACCU-CHEK GUIDE ME)  w/Device KIT CHECK BS DAILY DX E11.9   enalapril (VASOTEC) 2.5 MG tablet Take 1 tablet (2.5 mg total) by mouth daily.   glucose blood (FREESTYLE LITE) test strip Check BS daily Dx E11.9   metFORMIN (GLUCOPHAGE) 500 MG tablet Take 1 tablet (500 mg total) by mouth daily with breakfast.   [DISCONTINUED] lisinopril (ZESTRIL) 10 MG tablet Take 1 tablet (10 mg total) by mouth daily.   No facility-administered encounter medications on file as of 10/03/2023.    No Known Allergies  Pertinent ROS per HPI, otherwise unremarkable      Objective:  BP 135/74   Pulse 67   Temp 97.6 F (36.4 C)   Ht 5\' 7"  (1.702 m)   Wt 201 lb (91.2 kg)   SpO2 100%   BMI 31.48 kg/m    Wt Readings from Last 3 Encounters:  10/03/23 201 lb (91.2 kg)  09/08/23 200 lb (90.7 kg)  08/08/23 202 lb 6.4 oz (91.8 kg)    Physical Exam Vitals and nursing note reviewed.  Constitutional:      General: He is not in acute distress.    Appearance: Normal appearance. He is well-developed and well-groomed. He is obese. He is not ill-appearing, toxic-appearing or diaphoretic.  HENT:     Head: Normocephalic and atraumatic.     Jaw: There is normal jaw occlusion.     Right Ear: Hearing normal.     Left Ear: Hearing normal.     Nose: Nose normal.     Mouth/Throat:     Lips: Pink.     Mouth: Mucous membranes are moist.     Pharynx: Oropharynx is clear. Uvula midline.  Eyes:     General: Lids are normal.     Extraocular Movements: Extraocular movements intact.     Conjunctiva/sclera: Conjunctivae normal.     Pupils: Pupils are equal, round, and reactive to light.  Neck:     Trachea: Trachea and phonation normal.  Cardiovascular:     Rate and Rhythm: Normal rate and regular rhythm.     Chest Wall: PMI is not displaced.     Pulses: Normal pulses.     Heart sounds: Normal heart sounds. No murmur heard.    No friction rub. No gallop.  Pulmonary:     Effort: Pulmonary effort is normal. No respiratory distress.      Breath sounds: Normal breath sounds. No wheezing.  Abdominal:     General: Bowel sounds are normal.     Palpations: Abdomen is soft.  Musculoskeletal:        General: Normal range of motion.     Cervical back: Normal range of motion and neck supple.     Right lower leg: No edema.     Left lower leg: No edema.  Skin:    General: Skin is warm and dry.     Capillary Refill: Capillary refill takes less than 2 seconds.     Coloration: Skin is not cyanotic, jaundiced or pale.     Findings: No rash.  Neurological:     General: No focal deficit present.     Mental Status: He is alert and oriented to person, place, and time.     Sensory: Sensation is intact.     Motor: Motor function is intact.     Coordination: Coordination is intact.     Gait: Gait is intact.     Deep Tendon Reflexes: Reflexes are normal and symmetric.  Psychiatric:        Attention and Perception: Attention and perception normal.        Mood and Affect: Mood and affect normal.        Speech: Speech normal.        Behavior: Behavior normal. Behavior is cooperative.        Thought Content: Thought content normal.        Cognition and Memory: Cognition and memory normal.        Judgment: Judgment normal.      Results for orders placed or performed in visit on 07/04/23  Bayer DCA Hb A1c Waived   Collection Time: 07/04/23 12:15 PM  Result Value Ref Range   HB A1C (BAYER DCA - WAIVED) 7.9 (H) 4.8 - 5.6 %  CMP14+EGFR   Collection Time: 07/04/23 12:18 PM  Result Value Ref Range   Glucose 155 (H) 70 - 99 mg/dL   BUN 17 8 - 27 mg/dL  Creatinine, Ser 1.25 0.76 - 1.27 mg/dL   eGFR 64 >40 JW/JXB/1.47   BUN/Creatinine Ratio 14 10 - 24   Sodium 137 134 - 144 mmol/L   Potassium 4.5 3.5 - 5.2 mmol/L   Chloride 100 96 - 106 mmol/L   CO2 24 20 - 29 mmol/L   Calcium 9.8 8.6 - 10.2 mg/dL   Total Protein 6.7 6.0 - 8.5 g/dL   Albumin 4.6 3.9 - 4.9 g/dL   Globulin, Total 2.1 1.5 - 4.5 g/dL   Bilirubin Total 0.4 0.0 - 1.2  mg/dL   Alkaline Phosphatase 75 44 - 121 IU/L   AST 29 0 - 40 IU/L   ALT 46 (H) 0 - 44 IU/L  CBC with Differential/Platelet   Collection Time: 07/04/23 12:18 PM  Result Value Ref Range   WBC 6.7 3.4 - 10.8 x10E3/uL   RBC 5.36 4.14 - 5.80 x10E6/uL   Hemoglobin 15.5 13.0 - 17.7 g/dL   Hematocrit 82.9 56.2 - 51.0 %   MCV 88 79 - 97 fL   MCH 28.9 26.6 - 33.0 pg   MCHC 32.8 31.5 - 35.7 g/dL   RDW 13.0 86.5 - 78.4 %   Platelets 222 150 - 450 x10E3/uL   Neutrophils 59 Not Estab. %   Lymphs 28 Not Estab. %   Monocytes 8 Not Estab. %   Eos 4 Not Estab. %   Basos 1 Not Estab. %   Neutrophils Absolute 3.9 1.4 - 7.0 x10E3/uL   Lymphocytes Absolute 1.9 0.7 - 3.1 x10E3/uL   Monocytes Absolute 0.5 0.1 - 0.9 x10E3/uL   EOS (ABSOLUTE) 0.2 0.0 - 0.4 x10E3/uL   Basophils Absolute 0.1 0.0 - 0.2 x10E3/uL   Immature Granulocytes 0 Not Estab. %   Immature Grans (Abs) 0.0 0.0 - 0.1 x10E3/uL  Lipid panel   Collection Time: 07/04/23 12:18 PM  Result Value Ref Range   Cholesterol, Total 158 100 - 199 mg/dL   Triglycerides 696 (H) 0 - 149 mg/dL   HDL 27 (L) >29 mg/dL   VLDL Cholesterol Cal 29 5 - 40 mg/dL   LDL Chol Calc (NIH) 528 (H) 0 - 99 mg/dL   Chol/HDL Ratio 5.9 (H) 0.0 - 5.0 ratio       Pertinent labs & imaging results that were available during my care of the patient were reviewed by me and considered in my medical decision making.  Assessment & Plan:  Bryan Shepard was seen today for diabetes.  Diagnoses and all orders for this visit:  Hyperlipidemia associated with type 2 diabetes mellitus (HCC) -     Lipid panel  Type 2 diabetes mellitus with other specified complication, without long-term current use of insulin (HCC) -     Bayer DCA Hb A1c Waived -     metFORMIN (GLUCOPHAGE) 500 MG tablet; Take 1 tablet (500 mg total) by mouth daily with breakfast.  Hypertension associated with type 2 diabetes mellitus (HCC) -     CMP14+EGFR -     enalapril (VASOTEC) 2.5 MG tablet; Take 1 tablet (2.5  mg total) by mouth daily.  CKD stage 3 due to type 2 diabetes mellitus (HCC) -     enalapril (VASOTEC) 2.5 MG tablet; Take 1 tablet (2.5 mg total) by mouth daily.     Assessment and Plan    Hypertension Hypertension managed with lisinopril, experiencing side effects (rhinorrhea). Blood pressure today is 135/74, home readings similar. Lisinopril improved kidney function (creatinine from 1.34 to normal). Discussed switching to enalapril for  continued renal protection and blood pressure control with fewer side effects. Alternatives like losartan were also discussed. Emphasized importance of kidney protection due to diabetes. - Prescribe enalapril 2.5 mg once daily - Discontinue lisinopril - Monitor renal function and side effects in 3-4 months  Type 2 Diabetes Mellitus Type 2 Diabetes Mellitus managed with diet control. Blood sugars around 152, A1c increased from 7.9 to 8.2. Patient made dietary changes and exercises twice a week. Discussed restarting metformin to help control blood sugar levels and reduce A1c below 7. - Restart metformin 500 mg once daily - Check A1c in 3 months  Peripheral Neuropathy Peripheral neuropathy with numbness and tingling in toes. Symptoms improved with foot massager use. Explained that improved circulation from foot massager can help alleviate symptoms. - Monitor symptoms - Report any worsening of symptoms  Chronic Diarrhea Chronic diarrhea managed with Benefiber. Symptoms have subsided. Initially hesitant to take Benefiber but found it helpful. Explained that Benefiber helps bulk stool and can alleviate both diarrhea and constipation. - Continue Benefiber as needed  General Health Maintenance Making lifestyle changes including dietary modifications, increased fiber intake, and regular exercise. Quit sugary sodas, using sugar-free alternatives. Reduced fast food intake, prefers home-cooked meals. Explained benefits for overall health and diabetes  management. - Continue current lifestyle modifications - Follow up with dietician in April - Continue regular exercise with personal trainer  Follow-up - Follow up in 3-4 months for blood pressure and renal function - Check A1c in 3 months - Follow up with dietician in April.          Continue all other maintenance medications.  Follow up plan: Return in about 3 months (around 01/01/2024) for DM.   Continue healthy lifestyle choices, including diet (rich in fruits, vegetables, and lean proteins, and low in salt and simple carbohydrates) and exercise (at least 30 minutes of moderate physical activity daily).  Educational handout given for DM  The above assessment and management plan was discussed with the patient. The patient verbalized understanding of and has agreed to the management plan. Patient is aware to call the clinic if they develop any new symptoms or if symptoms persist or worsen. Patient is aware when to return to the clinic for a follow-up visit. Patient educated on when it is appropriate to go to the emergency department.   Kari Baars, FNP-C [facility] [phone]

## 2023-10-03 NOTE — Patient Instructions (Addendum)

## 2023-10-04 LAB — CMP14+EGFR
ALT: 38 [IU]/L (ref 0–44)
AST: 24 [IU]/L (ref 0–40)
Albumin: 4.5 g/dL (ref 3.9–4.9)
Alkaline Phosphatase: 77 [IU]/L (ref 44–121)
BUN/Creatinine Ratio: 14 (ref 10–24)
BUN: 18 mg/dL (ref 8–27)
Bilirubin Total: 0.6 mg/dL (ref 0.0–1.2)
CO2: 22 mmol/L (ref 20–29)
Calcium: 9.1 mg/dL (ref 8.6–10.2)
Chloride: 104 mmol/L (ref 96–106)
Creatinine, Ser: 1.25 mg/dL (ref 0.76–1.27)
Globulin, Total: 2.1 g/dL (ref 1.5–4.5)
Glucose: 126 mg/dL — ABNORMAL HIGH (ref 70–99)
Potassium: 4.6 mmol/L (ref 3.5–5.2)
Sodium: 139 mmol/L (ref 134–144)
Total Protein: 6.6 g/dL (ref 6.0–8.5)
eGFR: 64 mL/min/{1.73_m2} (ref >59)

## 2023-10-04 LAB — LIPID PANEL
Chol/HDL Ratio: 5.4 {ratio} — ABNORMAL HIGH (ref 0.0–5.0)
Cholesterol, Total: 150 mg/dL (ref 100–199)
HDL: 28 mg/dL — ABNORMAL LOW (ref >39)
LDL Chol Calc (NIH): 93 mg/dL (ref 0–99)
Triglycerides: 165 mg/dL — ABNORMAL HIGH (ref 0–149)
VLDL Cholesterol Cal: 29 mg/dL (ref 5–40)

## 2023-10-10 NOTE — Telephone Encounter (Signed)
 Abstract in HM. Updated Care Team.

## 2023-10-16 ENCOUNTER — Encounter: Payer: Self-pay | Admitting: Nutrition

## 2023-12-24 ENCOUNTER — Encounter: Payer: Self-pay | Admitting: Gastroenterology

## 2024-01-01 ENCOUNTER — Ambulatory Visit: Payer: TRICARE For Life (TFL) | Admitting: Family Medicine

## 2024-01-01 ENCOUNTER — Encounter: Payer: Self-pay | Admitting: Family Medicine

## 2024-01-01 VITALS — BP 127/79 | HR 74 | Temp 97.4°F | Ht 67.0 in | Wt 197.8 lb

## 2024-01-01 DIAGNOSIS — M25512 Pain in left shoulder: Secondary | ICD-10-CM

## 2024-01-01 DIAGNOSIS — I152 Hypertension secondary to endocrine disorders: Secondary | ICD-10-CM

## 2024-01-01 DIAGNOSIS — E1169 Type 2 diabetes mellitus with other specified complication: Secondary | ICD-10-CM

## 2024-01-01 DIAGNOSIS — Z91199 Patient's noncompliance with other medical treatment and regimen due to unspecified reason: Secondary | ICD-10-CM | POA: Diagnosis not present

## 2024-01-01 DIAGNOSIS — M79644 Pain in right finger(s): Secondary | ICD-10-CM

## 2024-01-01 DIAGNOSIS — E1122 Type 2 diabetes mellitus with diabetic chronic kidney disease: Secondary | ICD-10-CM

## 2024-01-01 DIAGNOSIS — G8929 Other chronic pain: Secondary | ICD-10-CM

## 2024-01-01 DIAGNOSIS — E785 Hyperlipidemia, unspecified: Secondary | ICD-10-CM

## 2024-01-01 DIAGNOSIS — N183 Chronic kidney disease, stage 3 unspecified: Secondary | ICD-10-CM

## 2024-01-01 DIAGNOSIS — E1159 Type 2 diabetes mellitus with other circulatory complications: Secondary | ICD-10-CM

## 2024-01-01 LAB — BAYER DCA HB A1C WAIVED: HB A1C (BAYER DCA - WAIVED): 6.6 % — ABNORMAL HIGH (ref 4.8–5.6)

## 2024-01-01 NOTE — Progress Notes (Signed)
 Subjective:  Patient ID: Bryan Jensen., male    DOB: 1955/11/04, 68 y.o.   MRN: [id number]  Patient Care Team: Sonny Masters, FNP as PCP - General (Family Medicine) Oretha Milch, MD as Consulting Physician (Pulmonary Disease) [facility], [place]   Chief Complaint:  Diabetes (3 month follow up )   HPI: Bryan Gosch. is a 68 y.o. male presenting on 01/01/2024 for Diabetes (3 month follow up )    History of Present Illness   Bryan Cotto. is a 68 year old male with diabetes who presents for follow-up on blood sugar control and shoulder pain. He is accompanied by his wife, a retired Publishing rights manager.  He has not restarted metformin after a previous A1c of 8.2%, due to his wife's concerns about the medication. He has taken metformin only a couple of times since then. He has been monitoring his blood sugars, which have been decreasing, with an average of 161 mg/dL recently, down from 865 mg/dL in January and 784 mg/dL in February. No increased hunger, thirst, or urination, although he notes increased urination due to higher water intake. He is currently taking lisinopril for blood pressure and renal protection and plans to switch to enalapril once his current supply runs out. He has not been on any other diabetes medications or statin therapy.  He reports shoulder pain that began shortly after his last visit. The pain is described as a dull ache with tingling down the arm. He has been seeing a chiropractor, which has provided some relief. He uses topical treatments such as Aleve roll-on and Biofreeze spray to manage the pain, especially when sitting for long periods, such as during church services where he wears a shoulder holster.  He also reports pain in his finger that started a few weeks after the shoulder pain. The finger pain is described as acute at onset and intermittent, with pain occurring upon pressure or accidental bumping. He has not noticed any  injuries to the shoulder or finger. He uses Tricaine and has tried Tylenol arthritis for relief.  He is trying to improve his general health by increasing water intake, aiming for eight hours of sleep per night, and incorporating more exercise into his routine. He has unintentionally lost about four pounds since his last visit. No confusion, weakness, leg swelling, fatigue, or changes in urinary output.          Relevant past medical, surgical, family, and social history reviewed and updated as indicated.  Allergies and medications reviewed and updated. Data reviewed: Chart in Epic.   Past Medical History:  Diagnosis Date   Acid reflux    Colon polyps    Diabetes mellitus type 2 in nonobese (HCC) 12/13/2020   Family history of genetic mutation for hereditary nonpolyposis colorectal cancer (HNPCC)    Family history of prostate cancer    Family history of stomach cancer    HOH (hard of hearing)    Hypertension    Sleep apnea    CPAP   Stroke (HCC) 2015   TIA (transient ischemic attack) 2016    Past Surgical History:  Procedure Laterality Date   CHOLECYSTECTOMY N/A 04/17/2020   Procedure: LAPAROSCOPIC CHOLECYSTECTOMY;  Surgeon: Franky Macho, MD;  Location: [facility];  Service: General;  Laterality: N/A;   COLONOSCOPY  2018   COLONOSCOPY WITH PROPOFOL N/A 12/07/2021   Surgeon: Lanelle Bal, DO;   nonbleeding internal hemorrhoids, 8 mm polyp (tubular  adenoma) in the descending colon removed.  Recommended 5-year surveillance.   POLYPECTOMY  12/07/2021   Procedure: POLYPECTOMY;  Surgeon: Lanelle Bal, DO;  Location: [facility];  Service: Endoscopy;;    Social History   Socioeconomic History   Marital status: Married    Spouse name: Bryan Shepard   Number of children: Not on file   Years of education: Not on file   Highest education level: Bachelor's degree (e.g., BA, AB, BS)  Occupational History   Occupation: retired     Comment: [employer]   Tobacco Use   Smoking status:  Never   Smokeless tobacco: Never  Vaping Use   Vaping status: Never Used  Substance and Sexual Activity   Alcohol use: No   Drug use: No   Sexual activity: Yes    Birth control/protection: None  Other Topics Concern   Not on file  Social History Narrative   Married , 1 step daughter    Social Drivers of Corporate investment banker Strain: Low Risk  (03/18/2023)   Overall Financial Resource Strain (CARDIA)    Difficulty of Paying Living Expenses: Not hard at all  Food Insecurity: No Food Insecurity (10/02/2023)   Hunger Vital Sign    Worried About Running Out of Food in the Last Year: Never true    Ran Out of Food in the Last Year: Never true  Transportation Needs: No Transportation Needs (10/02/2023)   PRAPARE - Administrator, Civil Service (Medical): No    Lack of Transportation (Non-Medical): No  Physical Activity: Insufficiently Active (10/02/2023)   Exercise Vital Sign    Days of Exercise per Week: 2 days    Minutes of Exercise per Session: 30 min  Stress: No Stress Concern Present (10/02/2023)   [employer] of Occupational Health - Occupational Stress Questionnaire    Feeling of Stress : Not at all  Social Connections: Socially Integrated (10/02/2023)   Social Connection and Isolation Panel [NHANES]    Frequency of Communication with Friends and Family: Three times a week    Frequency of Social Gatherings with Friends and Family: Three times a week    Attends Religious Services: More than 4 times per year    Active Member of Clubs or Organizations: Yes    Attends Banker Meetings: More than 4 times per year    Marital Status: Married  Catering manager Violence: Not At Risk (03/18/2023)   Humiliation, Afraid, Rape, and Kick questionnaire    Fear of Current or Ex-Partner: No    Emotionally Abused: No    Physically Abused: No    Sexually Abused: No    Outpatient Encounter Medications as of 01/01/2024  Medication Sig   Accu-Chek  FastClix Lancets MISC CHECK BS DAILY DX E11.9   Blood Glucose Monitoring Suppl (ACCU-CHEK GUIDE ME) w/Device KIT CHECK BS DAILY DX E11.9   enalapril (VASOTEC) 2.5 MG tablet Take 1 tablet (2.5 mg total) by mouth daily.   glucose blood (FREESTYLE LITE) test strip Check BS daily Dx E11.9   [DISCONTINUED] metFORMIN (GLUCOPHAGE) 500 MG tablet Take 1 tablet (500 mg total) by mouth daily with breakfast.   No facility-administered encounter medications on file as of 01/01/2024.    No Known Allergies  Pertinent ROS per HPI, otherwise unremarkable      Objective:  BP 127/79   Pulse 74   Temp (!) 97.4 F (36.3 C)   Ht 5\' 7"  (1.702 m)   Wt 197 lb 12.8 oz (  89.7 kg)   SpO2 94%   BMI 30.98 kg/m    Wt Readings from Last 3 Encounters:  01/01/24 197 lb 12.8 oz (89.7 kg)  10/03/23 201 lb (91.2 kg)  09/08/23 200 lb (90.7 kg)    Physical Exam Vitals and nursing note reviewed.  Constitutional:      General: He is not in acute distress.    Appearance: Normal appearance. He is obese. He is not ill-appearing, toxic-appearing or diaphoretic.  HENT:     Head: Normocephalic and atraumatic.     Nose: Nose normal.     Mouth/Throat:     Mouth: Mucous membranes are moist.  Eyes:     Conjunctiva/sclera: Conjunctivae normal.     Pupils: Pupils are equal, round, and reactive to light.  Cardiovascular:     Rate and Rhythm: Normal rate and regular rhythm.     Pulses: Normal pulses.     Heart sounds: Normal heart sounds.  Pulmonary:     Effort: Pulmonary effort is normal.     Breath sounds: Normal breath sounds.  Musculoskeletal:     Right hand: Normal. Normal range of motion.     Cervical back: Normal range of motion and neck supple.     Right lower leg: No edema.     Left lower leg: No edema.  Skin:    General: Skin is warm and dry.     Capillary Refill: Capillary refill takes less than 2 seconds.  Neurological:     General: No focal deficit present.     Mental Status: He is alert and  oriented to person, place, and time.  Psychiatric:        Mood and Affect: Mood normal.        Behavior: Behavior normal.        Thought Content: Thought content normal.        Judgment: Judgment normal.       Results for orders placed or performed in visit on 10/06/23  HM DIABETES EYE EXAM   Collection Time: 09/25/23 12:00 AM  Result Value Ref Range   HM Diabetic Eye Exam No Retinopathy No Retinopathy       Pertinent labs & imaging results that were available during my care of the patient were reviewed by me and considered in my medical decision making.  Assessment & Plan:  Bryan Shepard was seen today for diabetes.  Diagnoses and all orders for this visit:  Type 2 diabetes mellitus with other specified complication, without long-term current use of insulin (HCC) -     Bayer DCA Hb A1c Waived -     Microalbumin / creatinine urine ratio  Hyperlipidemia associated with type 2 diabetes mellitus (HCC) -     Lipid panel  Hypertension associated with type 2 diabetes mellitus (HCC) -     Microalbumin / creatinine urine ratio  CKD stage 3 due to type 2 diabetes mellitus (HCC) -     Microalbumin / creatinine urine ratio  Noncompliance with diabetes treatment Discussed importance.   Chronic pain in left shoulder Seeing Dr. Magnus Shepard   Pain of finger of right hand Symptomatic care discussed in detail. To report worsening symptoms.     Assessment and Plan    Type 2 Diabetes Mellitus Type 2 Diabetes Mellitus with recent A1c of 6.6%, improved from 8.2%. He has not been taking metformin due to his wife's concerns about potential organ damage. Blood sugar levels average 161 mg/dL. No other diabetes medications used. - Encourage lifestyle modifications  including diet and exercise to maintain blood sugar control. - Discuss potential benefits and risks of metformin with him and his wife to address concerns. - Follow up in three months to reassess diabetes  management.  Hypertension Currently taking lisinopril for hypertension and renal protection. Plans to switch to enalapril after current supply of lisinopril is finished. Both are ACE inhibitors and should not be taken together. - Continue lisinopril until the current supply is finished, then switch to enalapril. - Monitor blood pressure regularly to ensure adequate control.  Shoulder Pain Reports dull ache in shoulder with tingling down the arm. Receiving chiropractic treatment with some relief. No history of injury reported. - Continue chiropractic treatment and assess its effectiveness. - Consider Tylenol Arthritis for pain management. - If chiropractor does not perform imaging, consider ordering neck and shoulder x-ray.  Finger Pain Intermittent acute pain in a finger, exacerbated by pressure or bumping. No reported injury. - Trial Tylenol Arthritis for pain management. - Continue using topical treatments such as Biofreeze or Aleve for symptomatic relief.  General Health Maintenance Efforts to improve general health include increased water intake, exercise, and adequate sleep. Lost approximately four pounds since last visit. Water alone not expected to relieve joint pain, but supplements like osteobiflex or chondroitin may help with joint lubrication and arthritic pain. - Encourage continued lifestyle modifications including regular exercise and adequate hydration. - Consider supplements such as osteobiflex or chondroitin for joint health.          Continue all other maintenance medications.  Follow up plan: Return in about 3 months (around 04/02/2024) for DM.   Continue healthy lifestyle choices, including diet (rich in fruits, vegetables, and lean proteins, and low in salt and simple carbohydrates) and exercise (at least 30 minutes of moderate physical activity daily).  Educational handout given for DM  The above assessment and management plan was discussed with the patient.  The patient verbalized understanding of and has agreed to the management plan. Patient is aware to call the clinic if they develop any new symptoms or if symptoms persist or worsen. Patient is aware when to return to the clinic for a follow-up visit. Patient educated on when it is appropriate to go to the emergency department.   Kari Baars, FNP-C [facility] [phone]

## 2024-01-01 NOTE — Patient Instructions (Addendum)
 Osteo [facility]   Continue to monitor your blood sugars as we discussed and record them. Bring the log to your next appointment.  Take your medications as directed.    Goal Blood glucose:    Fasting (before meals) = 80 to 130   Within 2 hours of eating = less than 180   Understanding your Hemoglobin A1c: 6.6     Diabetes Mellitus and Nutrition    I think that you would greatly benefit from seeing a nutritionist. If this is something you are interested in, please call Dr Gerilyn Pilgrim at [phone] to schedule an appointment.   When you have diabetes (diabetes mellitus), it is very important to have healthy eating habits because your blood sugar (glucose) levels are greatly affected by what you eat and drink. Eating healthy foods in the appropriate amounts, at about the same times every day, can help you: Control your blood glucose. Lower your risk of heart disease. Improve your blood pressure. Reach or maintain a healthy weight.  Every person with diabetes is different, and each person has different needs for a meal plan. Your health care provider may recommend that you work with a diet and nutrition specialist (dietitian) to make a meal plan that is best for you. Your meal plan may vary depending on factors such as: The calories you need. The medicines you take. Your weight. Your blood glucose, blood pressure, and cholesterol levels. Your activity level. Other health conditions you have, such as heart or kidney disease.  How do carbohydrates affect me? Carbohydrates affect your blood glucose level more than any other type of food. Eating carbohydrates naturally increases the amount of glucose in your blood. Carbohydrate counting is a method for keeping track of how many carbohydrates you eat. Counting carbohydrates is important to keep your blood glucose at a healthy level, especially if you use insulin or take certain oral diabetes medicines. It is important to know how many  carbohydrates you can safely have in each meal. This is different for every person. Your dietitian can help you calculate how many carbohydrates you should have at each meal and for snack. Foods that contain carbohydrates include: Bread, cereal, rice, pasta, and crackers. Potatoes and corn. Peas, beans, and lentils. Milk and yogurt. Fruit and juice. Desserts, such as cakes, cookies, ice cream, and candy.  How does alcohol affect me? Alcohol can cause a sudden decrease in blood glucose (hypoglycemia), especially if you use insulin or take certain oral diabetes medicines. Hypoglycemia can be a life-threatening condition. Symptoms of hypoglycemia (sleepiness, dizziness, and confusion) are similar to symptoms of having too much alcohol. If your health care provider says that alcohol is safe for you, follow these guidelines: Limit alcohol intake to no more than 1 drink per day for nonpregnant women and 2 drinks per day for men. One drink equals 12 oz of beer, 5 oz of wine, or 1 oz of hard liquor. Do not drink on an empty stomach. Keep yourself hydrated with water, diet soda, or unsweetened iced tea. Keep in mind that regular soda, juice, and other mixers may contain a lot of sugar and must be counted as carbohydrates.  What are tips for following this plan?  Reading food labels Start by checking the serving size on the label. The amount of calories, carbohydrates, fats, and other nutrients listed on the label are based on one serving of the food. Many foods contain more than one serving per package. Check the total grams (g) of carbohydrates in one  serving. You can calculate the number of servings of carbohydrates in one serving by dividing the total carbohydrates by 15. For example, if a food has 30 g of total carbohydrates, it would be equal to 2 servings of carbohydrates. Check the number of grams (g) of saturated and trans fats in one serving. Choose foods that have low or no amount of these  fats. Check the number of milligrams (mg) of sodium in one serving. Most people should limit total sodium intake to less than 2,300 mg per day. Always check the nutrition information of foods labeled as "low-fat" or "nonfat". These foods may be higher in added sugar or refined carbohydrates and should be avoided. Talk to your dietitian to identify your daily goals for nutrients listed on the label.  Shopping Avoid buying canned, premade, or processed foods. These foods tend to be high in fat, sodium, and added sugar. Shop around the outside edge of the grocery store. This includes fresh fruits and vegetables, bulk grains, fresh meats, and fresh dairy.  Cooking Use low-heat cooking methods, such as baking, instead of high-heat cooking methods like deep frying. Cook using healthy oils, such as olive, canola, or sunflower oil. Avoid cooking with butter, cream, or high-fat meats.  Meal planning Eat meals and snacks regularly, preferably at the same times every day. Avoid going long periods of time without eating. Eat foods high in fiber, such as fresh fruits, vegetables, beans, and whole grains. Talk to your dietitian about how many servings of carbohydrates you can eat at each meal. Eat 4-6 ounces of lean protein each day, such as lean meat, chicken, fish, eggs, or tofu. 1 ounce is equal to 1 ounce of meat, chicken, or fish, 1 egg, or 1/4 cup of tofu. Eat some foods each day that contain healthy fats, such as avocado, nuts, seeds, and fish.  Lifestyle  Check your blood glucose regularly. Exercise at least 30 minutes 5 or more days each week, or as told by your health care provider. Take medicines as told by your health care provider. Do not use any products that contain nicotine or tobacco, such as cigarettes and e-cigarettes. If you need help quitting, ask your health care provider. Work with a Veterinary surgeon or diabetes educator to identify strategies to manage stress and any emotional and social  challenges.  What are some questions to ask my health care provider? Do I need to meet with a diabetes educator? Do I need to meet with a dietitian? What number can I call if I have questions? When are the best times to check my blood glucose?  Where to find more information: American Diabetes Association: diabetes.org/food-and-fitness/food Academy of Nutrition and Dietetics: https://www.vargas.com/ [employer] of Diabetes and Digestive and Kidney Diseases (NIH): FindJewelers.cz  Summary A healthy meal plan will help you control your blood glucose and maintain a healthy lifestyle. Working with a diet and nutrition specialist (dietitian) can help you make a meal plan that is best for you. Keep in mind that carbohydrates and alcohol have immediate effects on your blood glucose levels. It is important to count carbohydrates and to use alcohol carefully. This information is not intended to replace advice given to you by your health care provider. Make sure you discuss any questions you have with your health care provider. Document Released: 06/20/2005 Document Revised: 10/28/2016 Document Reviewed: 10/28/2016 Elsevier Interactive Patient Education  [employer].

## 2024-01-02 LAB — LIPID PANEL
Chol/HDL Ratio: 5.5 ratio — ABNORMAL HIGH (ref 0.0–5.0)
Cholesterol, Total: 155 mg/dL (ref 100–199)
HDL: 28 mg/dL — ABNORMAL LOW (ref >39)
LDL Chol Calc (NIH): 99 mg/dL (ref 0–99)
Triglycerides: 159 mg/dL — ABNORMAL HIGH (ref 0–149)
VLDL Cholesterol Cal: 28 mg/dL (ref 5–40)

## 2024-01-06 ENCOUNTER — Telehealth: Payer: Self-pay | Admitting: Family Medicine

## 2024-01-06 ENCOUNTER — Other Ambulatory Visit: Payer: Self-pay | Admitting: Family Medicine

## 2024-01-06 DIAGNOSIS — E1169 Type 2 diabetes mellitus with other specified complication: Secondary | ICD-10-CM

## 2024-01-06 NOTE — Telephone Encounter (Signed)
 Lmtcb  Need more information  What xray??

## 2024-01-06 NOTE — Telephone Encounter (Signed)
 Copied from CRM [id number]. Topic: Clinical - Medication Refill >> Jan 06, 2024 12:08 PM Priscille Loveless wrote: Most Recent Primary Care Visit:   Medication: glucose blood (FREESTYLE LITE) test strip Accu-Chek FastClix Lancets MISC   Has the patient contacted their pharmacy? Yes (Agent: If no, request that the patient contact the pharmacy for the refill. If patient does not wish to contact the pharmacy document the reason why and proceed with request.) (Agent: If yes, when and what did the pharmacy advise?)  Is this the correct pharmacy for this prescription? Yes If no, delete pharmacy and type the correct one.  This is the patient's preferred pharmacy:   [id number] - [place], [place] - [address] [place] [address] Phone: [phone] Fax: [phone]   Has the prescription been filled recently? Yes  Is the patient out of the medication? Yes  Has the patient been seen for an appointment in the last year OR does the patient have an upcoming appointment? Yes  Can we respond through MyChart? Yes  Agent: Please be advised that Rx refills may take up to 3 business days. We ask that you follow-up with your pharmacy.

## 2024-01-06 NOTE — Telephone Encounter (Signed)
 Copied from CRM [id number]. Topic: Clinical - Medical Advice >> Jan 06, 2024 12:11 PM Bryan Shepard wrote: Reason for CRM: Pts Chiropractor wants him to have xrays done within the office so that ins will pay. Please call pt and advise and possible schedule for him.

## 2024-01-07 ENCOUNTER — Encounter: Payer: TRICARE For Life (TFL) | Attending: Family Medicine | Admitting: Nutrition

## 2024-01-07 ENCOUNTER — Encounter: Payer: Self-pay | Admitting: Nutrition

## 2024-01-07 VITALS — Ht 67.0 in | Wt 201.0 lb

## 2024-01-07 DIAGNOSIS — E1122 Type 2 diabetes mellitus with diabetic chronic kidney disease: Secondary | ICD-10-CM | POA: Insufficient documentation

## 2024-01-07 DIAGNOSIS — E0800 Diabetes mellitus due to underlying condition with hyperosmolarity without nonketotic hyperglycemic-hyperosmolar coma (NKHHC): Secondary | ICD-10-CM

## 2024-01-07 DIAGNOSIS — E785 Hyperlipidemia, unspecified: Secondary | ICD-10-CM | POA: Insufficient documentation

## 2024-01-07 DIAGNOSIS — Z713 Dietary counseling and surveillance: Secondary | ICD-10-CM | POA: Diagnosis not present

## 2024-01-07 DIAGNOSIS — E1169 Type 2 diabetes mellitus with other specified complication: Secondary | ICD-10-CM | POA: Diagnosis present

## 2024-01-07 DIAGNOSIS — E66811 Obesity, class 1: Secondary | ICD-10-CM | POA: Insufficient documentation

## 2024-01-07 DIAGNOSIS — N183 Chronic kidney disease, stage 3 unspecified: Secondary | ICD-10-CM | POA: Diagnosis not present

## 2024-01-07 MED ORDER — FREESTYLE LITE TEST VI STRP: ORAL_STRIP | 3 refills | Status: DC

## 2024-01-07 MED ORDER — ACCU-CHEK FASTCLIX LANCETS MISC: 3 refills | Status: DC

## 2024-01-07 NOTE — Patient Instructions (Addendum)
 GOals  Keep up the great job. Increase walking 3 times per week Continue to eat more whole plant based foods for improved health.   Avoid processed foods and fast foods.

## 2024-01-07 NOTE — Progress Notes (Signed)
 Medical Nutrition Therapy  Appointment Start time:  0800  Appointment End time:  0830  Primary concerns today: Dm Type 2  Referral diagnosis: E11.65 Preferred learning style: NO Preference Learning readiness: Ready    NUTRITION ASSESSMENT Follow up  A1C 6.6%. Walking 1-2 times per week. Doing Huguley reps twice a week for stretching and exercising. Stopped LIsinopril due to causing his nose to be runny and changed over to Enalapril. Working on making better food choices. Eating whole grain oats, more fruits and vegetables. Eating out less. Testing blood sugars now.  AVG BS 162 at home testing 1-2 times per day. Feeling better. Weight is stable. He wants to reverse his diabetes. Goals set previously. Work on technique for checking blood sugar-doing better Try adding old fashion oats instead of processed packaged oatmeal-done Goal to get in 150 mins of exercise per week.-working on it. Get A1C down to to 7%. He is willing to work on Lifestyle Medicine to improve  his DM and reduce risks of health issues with focusing on more whole plant based foods and 6 pillars of health.  Clinical Lab Results  Component Value Date   HGBA1C 6.6 (H) 01/01/2024      Latest Ref Rng & Units 10/03/2023   12:41 PM 07/04/2023   12:18 PM 01/01/2023   10:49 AM  CMP  Glucose 70 - 99 mg/dL 161  096  045   BUN 8 - 27 mg/dL 18  17  14    Creatinine 0.76 - 1.27 mg/dL 4.09  8.11  9.14   Sodium 134 - 144 mmol/L 139  137  143   Potassium 3.5 - 5.2 mmol/L 4.6  4.5  4.4   Chloride 96 - 106 mmol/L 104  100  104   CO2 20 - 29 mmol/L 22  24  23    Calcium 8.6 - 10.2 mg/dL 9.1  9.8  9.5   Total Protein 6.0 - 8.5 g/dL 6.6  6.7  6.4   Total Bilirubin 0.0 - 1.2 mg/dL 0.6  0.4  0.4   Alkaline Phos 44 - 121 IU/L 77  75  70   AST 0 - 40 IU/L 24  29  19    ALT 0 - 44 IU/L 38  46  29    Lipid Panel     Component Value Date/Time   CHOL 155 01/01/2024 1108   TRIG 159 (H) 01/01/2024 1108   HDL 28 (L) 01/01/2024 1108    CHOLHDL 5.5 (H) 01/01/2024 1108   LDLCALC 99 01/01/2024 1108   LABVLDL 28 01/01/2024 1108    Medical Hx: Past Medical History:  Diagnosis Date   Acid reflux    Colon polyps    Diabetes mellitus type 2 in nonobese (HCC) 12/13/2020   Family history of genetic mutation for hereditary nonpolyposis colorectal cancer (HNPCC)    Family history of prostate cancer    Family history of stomach cancer    HOH (hard of hearing)    Hypertension    Sleep apnea    CPAP   Stroke (HCC) 2015   TIA (transient ischemic attack) 2016    Medications:  Current Outpatient Medications on File Prior to Visit  Medication Sig Dispense Refill   Accu-Chek FastClix Lancets MISC CHECK BS DAILY DX E11.9 100 each 3   Blood Glucose Monitoring Suppl (ACCU-CHEK GUIDE ME) w/Device KIT CHECK BS DAILY DX E11.9 1 kit 0   enalapril (VASOTEC) 2.5 MG tablet Take 1 tablet (2.5 mg total) by mouth daily. 90 tablet  1   glucose blood (FREESTYLE LITE) test strip Check BS daily Dx E11.9 100 strip 3   No current facility-administered medications on file prior to visit.    Labs:  Lab Results  Component Value Date   HGBA1C 6.6 (H) 01/01/2024      Latest Ref Rng & Units 10/03/2023   12:41 PM 07/04/2023   12:18 PM 01/01/2023   10:49 AM  CMP  Glucose 70 - 99 mg/dL 161  096  045   BUN 8 - 27 mg/dL 18  17  14    Creatinine 0.76 - 1.27 mg/dL 4.09  8.11  9.14   Sodium 134 - 144 mmol/L 139  137  143   Potassium 3.5 - 5.2 mmol/L 4.6  4.5  4.4   Chloride 96 - 106 mmol/L 104  100  104   CO2 20 - 29 mmol/L 22  24  23    Calcium 8.6 - 10.2 mg/dL 9.1  9.8  9.5   Total Protein 6.0 - 8.5 g/dL 6.6  6.7  6.4   Total Bilirubin 0.0 - 1.2 mg/dL 0.6  0.4  0.4   Alkaline Phos 44 - 121 IU/L 77  75  70   AST 0 - 40 IU/L 24  29  19    ALT 0 - 44 IU/L 38  46  29    Lipid Panel     Component Value Date/Time   CHOL 155 01/01/2024 1108   TRIG 159 (H) 01/01/2024 1108   HDL 28 (L) 01/01/2024 1108   CHOLHDL 5.5 (H) 01/01/2024 1108   LDLCALC 99  01/01/2024 1108   LABVLDL 28 01/01/2024 1108   Wt Readings from Last 3 Encounters:  01/01/24 197 lb 12.8 oz (89.7 kg)  10/03/23 201 lb (91.2 kg)  09/08/23 200 lb (90.7 kg)   Ht Readings from Last 3 Encounters:  01/01/24 5\' 7"  (1.702 m)  10/03/23 5\' 7"  (1.702 m)  09/08/23 5\' 7"  (1.702 m)   There is no height or weight on file to calculate BMI. @BMIFA @ Facility age limit for growth %iles is 20 years. Facility age limit for growth %iles is 20 years.   Notable Signs/Symptoms: Fatigue, increased hunger  Lifestyle & Dietary Hx LIves with his wife.  Eats 2-3 meals per day  Estimated daily fluid intake: 40 oz Supplements:  Sleep:  Stress / self-care:  Current average weekly physical activity: Walks a little  24-Hr Dietary Recall B) Packaged oatmeal.6 crackers,  Milk, water L) Leftovers from Thanksgiving, water D) 830 pm 2 wendy hamburgers, diet soda   Estimated Energy Needs Calories: 1600 Carbohydrate: 180g Protein: 120g Fat: 44g   NUTRITION DIAGNOSIS  NB-1.1 Food and nutrition-related knowledge deficit As related to Diabetes.  As evidenced by A1C 7.6%.   NUTRITION INTERVENTION  Nutrition education (E-1) on the following topics:  Nutrition and Diabetes education provided on My Plate, CHO counting, meal planning, portion sizes, timing of meals, avoiding snacks between meals unless having a low blood sugar, target ranges for A1C and blood sugars, signs/symptoms and treatment of hyper/hypoglycemia, monitoring blood sugars, taking medications as prescribed, benefits of exercising 30 minutes per day and prevention of complications of DM.  Lifestyle Medicine  - Whole Food, Plant Predominant Nutrition is highly recommended: Eat Plenty of vegetables, Mushrooms, fruits, Legumes, Whole Grains, Nuts, seeds in lieu of processed meats, processed snacks/pastries red meat, poultry, eggs.    -It is better to avoid simple carbohydrates including: Cakes, Sweet Desserts, Ice Cream, Soda  (diet and regular), Sweet  Tea, Candies, Chips, Cookies, Store Bought Juices, Alcohol in Excess of  1-2 drinks a day, Lemonade,  Artificial Sweeteners, Doughnuts, Coffee Creamers, "Sugar-free" Products, etc, etc.  This is not a complete list.....  Exercise: If you are able: 30 -60 minutes a day ,4 days a week, or 150 minutes a week.  The longer the better.  Combine stretch, strength, and aerobic activities.  If you were told in the past that you have high risk for cardiovascular diseases, you may seek evaluation by your heart doctor prior to initiating moderate to intense exercise programs.   Handouts Provided Include  Know your numbers Lifestyle Medicine handouts.   Learning Style & Readiness for Change Teaching method utilized: Visual & Auditory  Demonstrated degree of understanding via: Teach Back  Barriers to learning/adherence to lifestyle change: NOne  Goals Established by Pt Keep up the great job. Increase walking 3 times per week Continue to eat more whole plant based foods for improved health.   Avoid processed foods and fast foods.     MONITORING & EVALUATION Dietary intake, weekly physical activity, and BS in 3-4 months.  Next Steps  Patient is to work on meal planning, meal prepping and avoiding snacks.Marland Kitchen

## 2024-03-18 ENCOUNTER — Ambulatory Visit: Payer: Medicare Other

## 2024-03-18 VITALS — BP 127/79 | HR 74 | Ht 67.0 in | Wt 197.0 lb

## 2024-03-18 DIAGNOSIS — Z0000 Encounter for general adult medical examination without abnormal findings: Secondary | ICD-10-CM | POA: Diagnosis not present

## 2024-03-18 NOTE — Patient Instructions (Signed)
 Bryan Shepard , Thank you for taking time out of your busy schedule to complete your Annual Wellness Visit with me. I enjoyed our conversation and look forward to speaking with you again next year. I, as well as your care team,  appreciate your ongoing commitment to your health goals. Please review the following plan we discussed and let me know if I can assist you in the future. Your Game plan/ To Do List    Follow up Visits: Next Medicare AWV with our clinical staff: 03/21/25 at 1:50p.m.   Have you seen your provider in the last 6 months (3 months if uncontrolled diabetes)? Yes Next Office Visit with your provider: 04/06/24 at 11:05a.m.  Clinician Recommendations:  Aim for 30 minutes of exercise or brisk walking, 6-8 glasses of water, and 5 servings of fruits and vegetables each day.       This is a list of the screening recommended for you and due dates:  Health Maintenance  Topic Date Due   Zoster (Shingles) Vaccine (1 of 2) Never done   Yearly kidney health urinalysis for diabetes  01/01/2024   Pneumococcal Vaccine for age over 69 (1 of 2 - PCV) 07/01/2024*   Hepatitis C Screening  10/02/2024*   COVID-19 Vaccine (1) 04/03/2025*   Flu Shot  05/07/2024   Complete foot exam   07/03/2024   Hemoglobin A1C  07/03/2024   Eye exam for diabetics  09/24/2024   Yearly kidney function blood test for diabetes  10/02/2024   Medicare Annual Wellness Visit  03/18/2025   Colon Cancer Screening  12/08/2026   DTaP/Tdap/Td vaccine (3 - Td or Tdap) 08/11/2028   HPV Vaccine  Aged Out   Meningitis B Vaccine  Aged Out  *Topic was postponed. The date shown is not the original due date.    Advanced directives: (Copy Requested) Please bring a copy of your health care power of attorney and living will to the office to be added to your chart at your convenience. You can mail to [facility] [address] [place], [place] [address] or email to [email or web address] Advance Care Planning is  important because it:  [x]  Makes sure you receive the medical care that is consistent with your values, goals, and preferences  [x]  It provides guidance to your family and loved ones and reduces their decisional burden about whether or not they are making the right decisions based on your wishes.  Follow the link provided in your after visit summary or read over the paperwork we have mailed to you to help you started getting your Advance Directives in place. If you need assistance in completing these, please reach out to us  so that we can help you!  See attachments for Preventive Care and Fall Prevention Tips.

## 2024-03-18 NOTE — Progress Notes (Signed)
 Subjective:   Bryan Shepard. is a 68 y.o. who presents for a Medicare Wellness preventive visit.  As a reminder, Annual Wellness Visits don't include a physical exam, and some assessments may be limited, especially if this visit is performed virtually. We may recommend an in-person follow-up visit with your provider if needed.  Visit Complete: Virtual I connected with  Bryan Shepard. on 03/18/24 by a audio enabled telemedicine application and verified that I am speaking with the correct person using two identifiers.  Patient Location: Home  Provider Location: Home Office  I discussed the limitations of evaluation and management by telemedicine. The patient expressed understanding and agreed to proceed.  Vital Signs: Because this visit was a virtual/telehealth visit, some criteria may be missing or patient reported. Any vitals not documented were not able to be obtained and vitals that have been documented are patient reported.  VideoDeclined- This patient declined Librarian, academic. Therefore the visit was completed with audio only.  Persons Participating in Visit: Patient.  AWV Questionnaire: No: Patient Medicare AWV questionnaire was not completed prior to this visit.        Objective:    Today's Vitals   03/18/24 1220  BP: 127/79  Pulse: 74  Weight: 197 lb (89.4 kg)  Height: 5' 7 (1.702 m)   Body mass index is 30.85 kg/m.     03/18/2024   12:29 PM 03/18/2023    2:20 PM 12/07/2021    8:31 AM 04/17/2020    6:35 AM 04/15/2020    1:00 PM 04/15/2020    4:45 AM 11/07/2018    8:46 PM  Advanced Directives  Does Patient Have a Medical Advance Directive? Yes Yes Yes Yes No No No   Type of Estate agent of [place];Living will Healthcare Power of [place];Living will Healthcare Power of Attorney Living will     Does patient want to make changes to medical advance directive?    No - Patient declined     Copy of Healthcare  Power of Attorney in Chart? No - copy requested No - copy requested       Would patient like information on creating a medical advance directive?     No - Patient declined  No - Patient declined      Data saved with a previous flowsheet row definition    Current Medications (verified) Outpatient Encounter Medications as of 03/18/2024  Medication Sig   Accu-Chek FastClix Lancets MISC CHECK BS DAILY DX E11.9   Blood Glucose Monitoring Suppl (ACCU-CHEK GUIDE ME) w/Device KIT CHECK BS DAILY DX E11.9   enalapril  (VASOTEC ) 2.5 MG tablet Take 1 tablet (2.5 mg total) by mouth daily.   glucose blood (FREESTYLE LITE) test strip Check BS daily Dx E11.9   No facility-administered encounter medications on file as of 03/18/2024.    Allergies (verified) Patient has no known allergies.   History: Past Medical History:  Diagnosis Date   Acid reflux    Colon polyps    Diabetes mellitus type 2 in nonobese (HCC) 12/13/2020   Family history of genetic mutation for hereditary nonpolyposis colorectal cancer (HNPCC)    Family history of prostate cancer    Family history of stomach cancer    HOH (hard of hearing)    Hypertension    Sleep apnea    CPAP   Stroke (HCC) 2015   TIA (transient ischemic attack) 2016   Past Surgical History:  Procedure Laterality Date   CHOLECYSTECTOMY  N/A 04/17/2020   Procedure: LAPAROSCOPIC CHOLECYSTECTOMY;  Surgeon: Alanda Allegra, MD;  Location: [facility];  Service: General;  Laterality: N/A;   COLONOSCOPY  2018   COLONOSCOPY WITH PROPOFOL  N/A 12/07/2021   Surgeon: Goble Last K, DO;   nonbleeding internal hemorrhoids, 8 mm polyp (tubular adenoma) in the descending colon removed.  Recommended 5-year surveillance.   POLYPECTOMY  12/07/2021   Procedure: POLYPECTOMY;  Surgeon: Vinetta Greening, DO;  Location: [facility];  Service: Endoscopy;;   Family History  Problem Relation Age of Onset   Melanoma Mother 94   Hypertension Mother    Transient ischemic attack  Mother    Stroke Mother    Heart disease Mother    Prostate cancer Father 31   Stomach cancer Father 56       PMS2+; Lynch syndrome   Melanoma Father    Diabetes Father    Heart disease Father    Stroke Maternal Grandmother    Lung cancer Paternal Grandmother    Throat cancer Paternal Grandmother    Prostate cancer Paternal Grandfather 45   Bladder Cancer Other        MGM's mother with urethra cancer   Social History   Socioeconomic History   Marital status: Married    Spouse name: Bryan Shepard    Number of children: Not on file   Years of education: Not on file   Highest education level: Bachelor's degree (e.g., BA, AB, BS)  Occupational History   Occupation: retired     Comment: [employer]   Tobacco Use   Smoking status: Never   Smokeless tobacco: Never  Vaping Use   Vaping status: Never Used  Substance and Sexual Activity   Alcohol use: No   Drug use: No   Sexual activity: Yes    Birth control/protection: None  Other Topics Concern   Not on file  Social History Narrative   Married , 1 step daughter    Social Drivers of Corporate investment banker Strain: Low Risk  (03/18/2024)   Overall Financial Resource Strain (CARDIA)    Difficulty of Paying Living Expenses: Not hard at all  Food Insecurity: No Food Insecurity (03/18/2024)   Hunger Vital Sign    Worried About Running Out of Food in the Last Year: Never true    Ran Out of Food in the Last Year: Never true  Transportation Needs: No Transportation Needs (10/02/2023)   PRAPARE - Administrator, Civil Service (Medical): No    Lack of Transportation (Non-Medical): No  Physical Activity: Insufficiently Active (03/18/2024)   Exercise Vital Sign    Days of Exercise per Week: 2 days    Minutes of Exercise per Session: 30 min  Stress: No Stress Concern Present (03/18/2024)   [employer] of Occupational Health - Occupational Stress Questionnaire    Feeling of Stress: Not at all  Social Connections: Socially  Integrated (03/18/2024)   Social Connection and Isolation Panel    Frequency of Communication with Friends and Family: More than three times a week    Frequency of Social Gatherings with Friends and Family: More than three times a week    Attends Religious Services: More than 4 times per year    Active Member of [employer] or Organizations: Yes    Attends Engineer, structural: More than 4 times per year    Marital Status: Married    Tobacco Counseling Counseling given: Yes    Clinical Intake:  Pre-visit preparation completed: Yes  Pain : No/denies pain     BMI - recorded: 30.85 Nutritional Status: BMI > 30  Obese Nutritional Risks: None Diabetes: Yes CBG done?: No (per pt 175)  Lab Results  Component Value Date   HGBA1C 6.6 (H) 01/01/2024   HGBA1C 8.2 (H) 10/03/2023   HGBA1C 7.9 (H) 07/04/2023     How often do you need to have someone help you when you read instructions, pamphlets, or other written materials from your doctor or pharmacy?: 1 - Never  Interpreter Needed?: No  Information entered by :: Alia t/cma   Activities of Daily Living     03/18/2024    2:07 AM  In your present state of health, do you have any difficulty performing the following activities:  Hearing? 0  Vision? 0  Difficulty concentrating or making decisions? 0  Walking or climbing stairs? 0  Dressing or bathing? 0  Doing errands, shopping? 0  Preparing Food and eating ? N  Using the Toilet? N  In the past six months, have you accidently leaked urine? N  Do you have problems with loss of bowel control? N  Managing your Medications? N  Managing your Finances? N  Housekeeping or managing your Housekeeping? N    Patient Care Team: Galvin Jules, FNP as PCP - General (Family Medicine) Lind Repine, MD as Consulting Physician (Pulmonary Disease) [facility]  I have updated your Care Teams any recent Medical Services you may have received from other  providers in the past year.     Assessment:   This is a routine wellness examination for Aqu[place].  Hearing/Vision screen Hearing Screening - Comments:: Pt stated yes/have hearing aids Vision Screening - Comments:: Pt goes to the [facility] in [place], [place] last 6mos. 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 0 0 0 0 0  Number falls in past yr: 0    0  Injury with Fall? 0    0  Risk for fall due to :  No Fall Risks No Fall Risks  No Fall Risks  Follow up  Falls evaluation completed Falls evaluation completed Falls evaluation completed Falls prevention discussed    MEDICARE RISK AT HOME:   Medicare Risk at Home Any stairs in or around the home?: (Patient-Rptd) Yes If so, are there any without handrails?: (Patient-Rptd) No Home free of loose throw rugs in walkways, pet beds, electrical cords, etc?: (Patient-Rptd) Yes Adequate lighting in your home to reduce risk of falls?: (Patient-Rptd) Yes Life  alert?: (Patient-Rptd) No Use of a cane, walker or w/c?: (Patient-Rptd) No Grab bars in the bathroom?: (Patient-Rptd) No Shower chair or bench in shower?: (Patient-Rptd) No Elevated toilet seat or a handicapped toilet?: (Patient-Rptd) No  TIMED UP AND GO:  Was the test performed?  no  Cognitive Function: 6CIT completed        03/18/2024   12:33 PM 03/18/2023    2:21 PM  6CIT Screen  What Year? 0 points 0  points  What month? 0 points 0 points  What time? 0 points 0 points  Count back from 20 0 points 0 points  Months in reverse 0 points 0 points  Repeat phrase 0 points 0 points  Total Score 0 points 0 points    Immunizations Immunization History  Administered Date(s) Administered   IPV 07/04/2008   Meningococcal Acwy, Unspecified 07/04/2008   Meningococcal Conjugate 07/04/2008   Tdap 07/04/2008, 08/11/2018   Typhoid Live 07/04/2008   Yellow Fever 07/04/2008    Screening Tests Health Maintenance  Topic Date Due   Zoster Vaccines- Shingrix  (1 of 2) Never done   Diabetic kidney evaluation - Urine ACR  01/01/2024   Medicare Annual Wellness (AWV)  03/17/2024   Pneumococcal Vaccine: 50+ Years (1 of 2 - PCV) 07/01/2024 (Originally 03/15/1975)   Hepatitis C Screening  10/02/2024 (Originally 03/16/1974)   COVID-19 Vaccine (1) 04/03/2025 (Originally 03/15/1961)   INFLUENZA VACCINE  05/07/2024   FOOT EXAM  07/03/2024   HEMOGLOBIN A1C  07/03/2024   OPHTHALMOLOGY EXAM  09/24/2024   Diabetic kidney evaluation - eGFR measurement  10/02/2024   Colonoscopy  12/08/2026   DTaP/Tdap/Td (3 - Td or Tdap) 08/11/2028   HPV VACCINES  Aged Out   Meningococcal B Vaccine  Aged Out    Health Maintenance  Health Maintenance Due  Topic Date Due   Zoster Vaccines- Shingrix  (1 of 2) Never done   Diabetic kidney evaluation - Urine ACR  01/01/2024   Medicare Annual Wellness (AWV)  03/17/2024   Health Maintenance Items Addressed: See Nurse Notes at the end of this note  Additional Screening:  Vision Screening: Recommended annual ophthalmology exams for early detection of glaucoma and other disorders of the eye. Would you like a referral to an eye doctor? No    Dental Screening: Recommended annual dental exams for proper oral hygiene  Community Resource Referral / Chronic Care Management: CRR required this visit?  No   CCM required this visit?  No   Plan:    I have personally reviewed and  noted the following in the patient's chart:   Medical and social history Use of alcohol, tobacco or illicit drugs  Current medications and supplements including opioid prescriptions. Patient is not currently taking opioid prescriptions. Functional ability and status Nutritional status Physical activity Advanced directives List of other physicians Hospitalizations, surgeries, and ER visits in previous 12 months Vitals Screenings to include cognitive, depression, and falls Referrals and appointments  In addition, I have reviewed and discussed with patient certain preventive protocols, quality metrics, and best practice recommendations. A written personalized care plan for preventive services as well as general preventive health recommendations were provided to patient.   Michaelle Adolphus, CMA   03/18/2024   After Visit Summary: (MyChart) Due to this being a telephonic visit, the after visit summary with patients personalized plan was offered to patient via MyChart   Notes: Pt is aware due for Shingles vaccine & UrineACR

## 2024-04-06 ENCOUNTER — Encounter: Payer: Self-pay | Admitting: Family Medicine

## 2024-04-06 ENCOUNTER — Ambulatory Visit (INDEPENDENT_AMBULATORY_CARE_PROVIDER_SITE_OTHER): Admitting: Family Medicine

## 2024-04-06 VITALS — BP 127/81 | HR 92 | Temp 97.1°F | Ht 67.0 in | Wt 198.0 lb

## 2024-04-06 DIAGNOSIS — Z91199 Patient's noncompliance with other medical treatment and regimen due to unspecified reason: Secondary | ICD-10-CM | POA: Diagnosis not present

## 2024-04-06 DIAGNOSIS — I152 Hypertension secondary to endocrine disorders: Secondary | ICD-10-CM

## 2024-04-06 DIAGNOSIS — E1122 Type 2 diabetes mellitus with diabetic chronic kidney disease: Secondary | ICD-10-CM

## 2024-04-06 DIAGNOSIS — M720 Palmar fascial fibromatosis [Dupuytren]: Secondary | ICD-10-CM

## 2024-04-06 DIAGNOSIS — E785 Hyperlipidemia, unspecified: Secondary | ICD-10-CM

## 2024-04-06 DIAGNOSIS — E1169 Type 2 diabetes mellitus with other specified complication: Secondary | ICD-10-CM

## 2024-04-06 DIAGNOSIS — E1159 Type 2 diabetes mellitus with other circulatory complications: Secondary | ICD-10-CM | POA: Diagnosis not present

## 2024-04-06 DIAGNOSIS — L821 Other seborrheic keratosis: Secondary | ICD-10-CM

## 2024-04-06 DIAGNOSIS — N183 Chronic kidney disease, stage 3 unspecified: Secondary | ICD-10-CM

## 2024-04-06 DIAGNOSIS — M79645 Pain in left finger(s): Secondary | ICD-10-CM

## 2024-04-06 LAB — BAYER DCA HB A1C WAIVED: HB A1C (BAYER DCA - WAIVED): 8.8 % — ABNORMAL HIGH (ref 4.8–5.6)

## 2024-04-06 MED ORDER — ENALAPRIL MALEATE 2.5 MG PO TABS: 2.5000 mg | ORAL_TABLET | Freq: Every day | ORAL | Status: DC

## 2024-04-06 NOTE — Patient Instructions (Addendum)
 Glycolic Acid body scrub for arm lesion   ASCVD (Atherosclerotic Cardiovascular Disease) 2013 Risk Calculator from AHA/ACC from StatOfficial.co.za  on 04/06/2024  RESULT SUMMARY:   High-intensity statin recommended because of known diabetes and 10-year risk >=7.5%. If LDL 70mg /dl (8.18 mmol/L), additional factors like lifestyle and risk-benefits can be considered before starting statins.   33.9-32.4%   Risk of cardiovascular event (coronary or stroke death or non-fatal MI or stroke) in next 10 years.  10.4-7.8%   10-year cardiovascular risk if risk factors were optimal.   INPUTS: Age --> 68 years Diabetes --> 1 = Yes Sex --> 1 = Male Smoker --> 0 = No Total cholesterol --> 155 mg/dL HDL cholesterol --> 28 mg/dL Systolic blood pressure --> 127 mm Hg Treatment for hypertension --> 1 = Yes   Continue to monitor your blood sugars as we discussed and record them. Bring the log to your next appointment.  Take your medications as directed.    Goal Blood glucose:    Fasting (before meals) = 80 to 130   Within 2 hours of eating = less than 180   Understanding your Hemoglobin A1c: 8.8   Medications to consider: Rybelsus Ozempic Mounjaro Farxiga      Diabetes Mellitus and Nutrition    I think that you would greatly benefit from seeing a nutritionist. If this is something you are interested in, please call Dr Wonda at [phone] to schedule an appointment.   When you have diabetes (diabetes mellitus), it is very important to have healthy eating habits because your blood sugar (glucose) levels are greatly affected by what you eat and drink. Eating healthy foods in the appropriate amounts, at about the same times every day, can help you: Control your blood glucose. Lower your risk of heart disease. Improve your blood pressure. Reach or maintain a healthy weight.  Every person with diabetes is different, and each person has different needs for a meal plan. Your health care  provider may recommend that you work with a diet and nutrition specialist (dietitian) to make a meal plan that is best for you. Your meal plan may vary depending on factors such as: The calories you need. The medicines you take. Your weight. Your blood glucose, blood pressure, and cholesterol levels. Your activity level. Other health conditions you have, such as heart or kidney disease.  How do carbohydrates affect me? Carbohydrates affect your blood glucose level more than any other type of food. Eating carbohydrates naturally increases the amount of glucose in your blood. Carbohydrate counting is a method for keeping track of how many carbohydrates you eat. Counting carbohydrates is important to keep your blood glucose at a healthy level, especially if you use insulin or take certain oral diabetes medicines. It is important to know how many carbohydrates you can safely have in each meal. This is different for every person. Your dietitian can help you calculate how many carbohydrates you should have at each meal and for snack. Foods that contain carbohydrates include: Bread, cereal, rice, pasta, and crackers. Potatoes and corn. Peas, beans, and lentils. Milk and yogurt. Fruit and juice. Desserts, such as cakes, cookies, ice cream, and candy.  How does alcohol affect me? Alcohol can cause a sudden decrease in blood glucose (hypoglycemia), especially if you use insulin or take certain oral diabetes medicines. Hypoglycemia can be a life-threatening condition. Symptoms of hypoglycemia (sleepiness, dizziness, and confusion) are similar to symptoms of having too much alcohol. If your health care provider says that alcohol is  safe for you, follow these guidelines: Limit alcohol intake to no more than 1 drink per day for nonpregnant women and 2 drinks per day for men. One drink equals 12 oz of beer, 5 oz of wine, or 1 oz of hard liquor. Do not drink on an empty stomach. Keep yourself hydrated with  water, diet soda, or unsweetened iced tea. Keep in mind that regular soda, juice, and other mixers may contain a lot of sugar and must be counted as carbohydrates.  What are tips for following this plan?  Reading food labels Start by checking the serving size on the label. The amount of calories, carbohydrates, fats, and other nutrients listed on the label are based on one serving of the food. Many foods contain more than one serving per package. Check the total grams (g) of carbohydrates in one serving. You can calculate the number of servings of carbohydrates in one serving by dividing the total carbohydrates by 15. For example, if a food has 30 g of total carbohydrates, it would be equal to 2 servings of carbohydrates. Check the number of grams (g) of saturated and trans fats in one serving. Choose foods that have low or no amount of these fats. Check the number of milligrams (mg) of sodium in one serving. Most people should limit total sodium intake to less than 2,300 mg per day. Always check the nutrition information of foods labeled as low-fat or nonfat. These foods may be higher in added sugar or refined carbohydrates and should be avoided. Talk to your dietitian to identify your daily goals for nutrients listed on the label.  Shopping Avoid buying canned, premade, or processed foods. These foods tend to be high in fat, sodium, and added sugar. Shop around the outside edge of the grocery store. This includes fresh fruits and vegetables, bulk grains, fresh meats, and fresh dairy.  Cooking Use low-heat cooking methods, such as baking, instead of high-heat cooking methods like deep frying. Cook using healthy oils, such as olive, canola, or sunflower oil. Avoid cooking with butter, cream, or high-fat meats.  Meal planning Eat meals and snacks regularly, preferably at the same times every day. Avoid going long periods of time without eating. Eat foods high in fiber, such as fresh  fruits, vegetables, beans, and whole grains. Talk to your dietitian about how many servings of carbohydrates you can eat at each meal. Eat 4-6 ounces of lean protein each day, such as lean meat, chicken, fish, eggs, or tofu. 1 ounce is equal to 1 ounce of meat, chicken, or fish, 1 egg, or 1/4 cup of tofu. Eat some foods each day that contain healthy fats, such as avocado, nuts, seeds, and fish.  Lifestyle  Check your blood glucose regularly. Exercise at least 30 minutes 5 or more days each week, or as told by your health care provider. Take medicines as told by your health care provider. Do not use any products that contain nicotine or tobacco, such as cigarettes and e-cigarettes. If you need help quitting, ask your health care provider. Work with a Veterinary surgeon or diabetes educator to identify strategies to manage stress and any emotional and social challenges.  What are some questions to ask my health care provider? Do I need to meet with a diabetes educator? Do I need to meet with a dietitian? What number can I call if I have questions? When are the best times to check my blood glucose?  Where to find more information: American Diabetes Association: diabetes.org/food-and-fitness/food Academy  of Nutrition and Dietetics: https://www.vargas.com/ [employer] of Diabetes and Digestive and Kidney Diseases (NIH): FindJewelers.cz  Summary A healthy meal plan will help you control your blood glucose and maintain a healthy lifestyle. Working with a diet and nutrition specialist (dietitian) can help you make a meal plan that is best for you. Keep in mind that carbohydrates and alcohol have immediate effects on your blood glucose levels. It is important to count carbohydrates and to use alcohol carefully. This information is not intended to replace advice given to you by your health  care provider. Make sure you discuss any questions you have with your health care provider. Document Released: 06/20/2005 Document Revised: 10/28/2016 Document Reviewed: 10/28/2016 Elsevier Interactive Patient Education  [employer].

## 2024-04-06 NOTE — Progress Notes (Signed)
 Subjective:  Patient ID: Bryan FORBES [name], male    DOB: July 02, 1956, 68 y.o.   MRN: [id number]  Patient Care Team: Severa Rock HERO, FNP as PCP - General (Family Medicine) Jude Harden GAILS, MD as Consulting Physician (Pulmonary Disease) [facility]   Chief Complaint:  Diabetes (3 month follow up )   HPI: Bryan Guarisco. is a 68 y.o. male presenting on 04/06/2024 for Diabetes (3 month follow up )   Bryan FORBES [name] is a 68 year old male with diabetes and hypertension who presents for medication management and follow-up.  Hyperglycemia and diabetes management - Diabetes managed with lifestyle modifications including increased exercise, walking, increased water intake, and dietary changes such as eating more salads and reducing sweets and soft drinks - Blood glucose levels remain elevated, particularly after dietary indiscretions (e.g., recent church cookout with hamburger consumption) - No polyphagia, polydipsia, or polyuria - Not currently taking metformin  due to spouse's disapproval - Occasionally uses flavored water to improve taste and avoid reflux  Hypertension management - Hypertension managed with lisinopril  and daily baby aspirin - Currently in the process of switching from lisinopril  to enalapril  through the [facility] - Obtains medications through the [facility]  Dyslipidemia - History of elevated triglycerides and low HDL (HDL 28) - Total cholesterol 155 - Drinks olive oil nightly in an attempt to improve lipid profile  Dupuytren's contracture - Longstanding contracture of the left index finger - Similar contractures in the palms - No significant pain associated with contractures - Progression observed over several years  Cutaneous lesion - Skin lesion on forearm present for several weeks - Initially appeared as a red spot - Uncertain if lesion is a bite or a mole  Sleep apnea evaluation - [facility] - [facility] delayed  due to scheduling issues          Relevant past medical, surgical, family, and social history reviewed and updated as indicated.  Allergies and medications reviewed and updated. Data reviewed: Chart in Epic.   Past Medical History:  Diagnosis Date   Acid reflux    Colon polyps    Diabetes mellitus type 2 in nonobese (HCC) 12/13/2020   Family history of genetic mutation for hereditary nonpolyposis colorectal cancer (HNPCC)    Family history of prostate cancer    Family history of stomach cancer    HOH (hard of hearing)    Hypertension    Sleep apnea    CPAP   Stroke (HCC) 2015   TIA (transient ischemic attack) 2016    Past Surgical History:  Procedure Laterality Date   CHOLECYSTECTOMY N/A 04/17/2020   Procedure: LAPAROSCOPIC CHOLECYSTECTOMY;  Surgeon: Mavis Anes, MD;  Location: [facility];  Service: General;  Laterality: N/A;   COLONOSCOPY  2018   COLONOSCOPY WITH PROPOFOL  N/A 12/07/2021   Surgeon: Cindie Dunnings K, DO;   nonbleeding internal hemorrhoids, 8 mm polyp (tubular adenoma) in the descending colon removed.  Recommended 5-year surveillance.   POLYPECTOMY  12/07/2021   Procedure: POLYPECTOMY;  Surgeon: Cindie Dunnings POUR, DO;  Location: [facility];  Service: Endoscopy;;    Social History   Socioeconomic History   Marital status: Married    Spouse name: [place]    Number of children: Not on file   Years of education: Not on file   Highest education level: Bachelor's degree (e.g., BA, AB, BS)  Occupational History   Occupation: retired     Comment:  [employer]   Tobacco Use   Smoking status: Never   Smokeless tobacco: Never  Vaping Use   Vaping status: Never Used  Substance and Sexual Activity   Alcohol use: No   Drug use: No   Sexual activity: Yes    Birth control/protection: None  Other Topics Concern   Not on file  Social History Narrative   Married , 1 step daughter    Social Drivers of Corporate investment banker Strain: Low Risk  (03/18/2024)    Overall Financial Resource Strain (CARDIA)    Difficulty of Paying Living Expenses: Not hard at all  Food Insecurity: No Food Insecurity (03/18/2024)   Hunger Vital Sign    Worried About Running Out of Food in the Last Year: Never true    Ran Out of Food in the Last Year: Never true  Transportation Needs: No Transportation Needs (10/02/2023)   PRAPARE - Administrator, Civil Service (Medical): No    Lack of Transportation (Non-Medical): No  Physical Activity: Insufficiently Active (03/18/2024)   Exercise Vital Sign    Days of Exercise per Week: 2 days    Minutes of Exercise per Session: 30 min  Stress: No Stress Concern Present (03/18/2024)   [employer] of Occupational Health - Occupational Stress Questionnaire    Feeling of Stress: Not at all  Social Connections: Socially Integrated (03/18/2024)   Social Connection and Isolation Panel    Frequency of Communication with Friends and Family: More than three times a week    Frequency of Social Gatherings with Friends and Family: More than three times a week    Attends Religious Services: More than 4 times per year    Active Member of [employer] or Organizations: Yes    Attends Engineer, structural: More than 4 times per year    Marital Status: Married  Catering manager Violence: Not At Risk (03/18/2024)   Humiliation, Afraid, Rape, and Kick questionnaire    Fear of Current or Ex-Partner: No    Emotionally Abused: No    Physically Abused: No    Sexually Abused: No    Outpatient Encounter Medications as of 04/06/2024  Medication Sig   Accu-Chek FastClix Lancets MISC CHECK BS DAILY DX E11.9   aspirin 81 MG chewable tablet Chew 81 mg by mouth daily.   Blood Glucose Monitoring Suppl (ACCU-CHEK GUIDE ME) w/Device KIT CHECK BS DAILY DX E11.9   glucose blood (FREESTYLE LITE) test strip Check BS daily Dx E11.9   [DISCONTINUED] lisinopril  (ZESTRIL ) 10 MG tablet Take 10 mg by mouth daily.   enalapril  (VASOTEC ) 2.5 MG tablet  Take 1 tablet (2.5 mg total) by mouth daily.   [DISCONTINUED] enalapril  (VASOTEC ) 2.5 MG tablet Take 1 tablet (2.5 mg total) by mouth daily. (Patient not taking: Reported on 04/06/2024)   No facility-administered encounter medications on file as of 04/06/2024.    No Known Allergies  Pertinent ROS per HPI, otherwise unremarkable      Objective:  BP 127/81   Pulse 92   Temp (!) 97.1 F (36.2 C)   Ht 5' 7 (1.702 m)   Wt 198 lb (89.8 kg)   BMI 31.01 kg/m    Wt Readings from Last 3 Encounters:  04/06/24 198 lb (89.8 kg)  03/18/24 197 lb (89.4 kg)  01/07/24 201 lb (91.2 kg)    Physical Exam Vitals and nursing note reviewed.  Constitutional:      General: He is not in acute distress.  Appearance: Normal appearance. He is well-developed and well-groomed. He is obese. He is not ill-appearing, toxic-appearing or diaphoretic.  HENT:     Head: Normocephalic and atraumatic.     Jaw: There is normal jaw occlusion.     Right Ear: Hearing normal.     Left Ear: Hearing normal.     Nose: Nose normal.     Mouth/Throat:     Lips: Pink.     Mouth: Mucous membranes are moist.     Pharynx: Oropharynx is clear. Uvula midline.   Eyes:     General: Lids are normal.     Extraocular Movements: Extraocular movements intact.     Conjunctiva/sclera: Conjunctivae normal.     Pupils: Pupils are equal, round, and reactive to light.   Neck:     Thyroid : No thyroid  mass, thyromegaly or thyroid  tenderness.     Vascular: No carotid bruit or JVD.     Trachea: Trachea and phonation normal.   Cardiovascular:     Rate and Rhythm: Normal rate and regular rhythm.     Chest Wall: PMI is not displaced.     Pulses: Normal pulses.     Heart sounds: Normal heart sounds. No murmur heard.    No friction rub. No gallop.  Pulmonary:     Effort: Pulmonary effort is normal. No respiratory distress.     Breath sounds: Normal breath sounds. No wheezing.  Abdominal:     General: Bowel sounds are normal. There  is no abdominal bruit.     Palpations: Abdomen is soft. There is no hepatomegaly or splenomegaly.   Musculoskeletal:        General: Normal range of motion.     Cervical back: Normal range of motion and neck supple.     Right lower leg: No edema.     Left lower leg: No edema.     Comments: Bilateral palms with triangular thickening of the cords of the palmar fascia  Left index finger with decreased ROM  Lymphadenopathy:     Cervical: No cervical adenopathy.   Skin:    General: Skin is warm and dry.     Capillary Refill: Capillary refill takes less than 2 seconds.     Coloration: Skin is not cyanotic, jaundiced or pale.     Findings: Lesion present. No rash.       Neurological:     General: No focal deficit present.     Mental Status: He is alert and oriented to person, place, and time.     Sensory: Sensation is intact.     Motor: Motor function is intact.     Coordination: Coordination is intact.     Gait: Gait is intact.     Deep Tendon Reflexes: Reflexes are normal and symmetric.   Psychiatric:        Attention and Perception: Attention and perception normal.        Mood and Affect: Mood and affect normal.        Speech: Speech normal.        Behavior: Behavior normal. Behavior is cooperative.        Thought Content: Thought content normal.        Cognition and Memory: Cognition and memory normal.        Judgment: Judgment normal.      Results for orders placed or performed in visit on 01/01/24  Bayer DCA Hb A1c Waived   Collection Time: 01/01/24 11:03 AM  Result Value Ref Range  HB A1C (BAYER DCA - WAIVED) 6.6 (H) 4.8 - 5.6 %  Lipid panel   Collection Time: 01/01/24 11:08 AM  Result Value Ref Range   Cholesterol, Total 155 100 - 199 mg/dL   Triglycerides 840 (H) 0 - 149 mg/dL   HDL 28 (L) >60 mg/dL   VLDL Cholesterol Cal 28 5 - 40 mg/dL   LDL Chol Calc (NIH) 99 0 - 99 mg/dL   Chol/HDL Ratio 5.5 (H) 0.0 - 5.0 ratio       Pertinent labs & imaging results  that were available during my care of the patient were reviewed by me and considered in my medical decision making.  Assessment & Plan:  Bryan Shepard was seen today for diabetes.  Diagnoses and all orders for this visit:  Type 2 diabetes mellitus with other specified complication, without long-term current use of insulin (HCC) -     Bayer DCA Hb A1c Waived -     Lipid panel -     Microalbumin / creatinine urine ratio -     AMB Referral [facility] Care Management  Hypertension associated with type 2 diabetes mellitus (HCC) -     Bayer DCA Hb A1c Waived -     Lipid panel -     Microalbumin / creatinine urine ratio -     enalapril  (VASOTEC ) 2.5 MG tablet; Take 1 tablet (2.5 mg total) by mouth daily. -     AMB Referral [facility] Care Management  CKD stage 3 due to type 2 diabetes mellitus (HCC) -     Bayer DCA Hb A1c Waived -     Microalbumin / creatinine urine ratio -     enalapril  (VASOTEC ) 2.5 MG tablet; Take 1 tablet (2.5 mg total) by mouth daily. -     AMB Referral [facility] Care Management  Hyperlipidemia associated with type 2 diabetes mellitus (HCC) -     Bayer DCA Hb A1c Waived -     Lipid panel -     AMB Referral [facility] Care Management  Noncompliance with diabetes treatment -     Bayer DCA Hb A1c Waived -     AMB Referral [facility] Care Management  Pain of finger of left hand -     Ambulatory referral to Orthopedic Surgery  Dupuytren contracture of both hands -     Ambulatory referral to Orthopedic Surgery  Seborrheic keratosis Discussed symptomatic treatment.      Diabetes Mellitus Type 2 Diabetes is uncontrolled with an A1c of 8.8, increased from 6.6. He has been off metformin , managing diabetes with lifestyle changes, including diet and exercise. Blood sugars are high, particularly after dietary indiscretions. Experiencing weight loss, a positive outcome of lifestyle changes. Emphasized the importance of medication to prevent end organ damage, including renal failure, stroke, myocardial  infarction, erectile dysfunction, vision loss, and neuropathy. Discussed GLP-1 receptor agonists and SGLT2 inhibitors, which have cardioprotective benefits. Recommended GLP-1 receptor agonists due to his cardiovascular history. - Consider starting GLP-1 receptor agonists (Rybelsus, Ozempic, Mounjaro) or SGLT2 inhibitors Pauletta). - Refer to pharmacist for diabetic education. - Download and use My Fitness Pal app to track diet and exercise. - Keep a food log to monitor dietary intake.  Hypertension Currently on lisinopril  for management.  Hyperlipidemia Total cholesterol is 155, but triglycerides are elevated and HDL is low at 28. Emphasized increased cardiovascular risk due to diabetes, hypertension, and hyperlipidemia, with a 34% risk of stroke or myocardial infarction in the next ten years. Recommended statin therapy to reduce  cardiovascular risk, but he is hesitant. Suggested red yeast rice as a natural alternative and discussed the benefits of a Mediterranean diet to increase HDL levels. Explained that red yeast rice may be more beneficial than olive oil for cholesterol management. - Consider starting high-intensity statin therapy. - Consider red yeast rice supplement as a natural alternative. - Adopt a Mediterranean diet to increase HDL levels.  Dupuytren's Contracture Has Dupuytren's contracture in both hands, present for years. The condition is not causing significant symptoms, but he is interested in a referral to orthopedics for further evaluation and management options, such as injections. - Refer to orthopedics for evaluation and management.  Seborrheic Keratosis Lesion on the forearm identified as seborrheic keratosis, a benign buildup of keratin. Recommended using a glycolic acid body scrub to reduce the lesion. - Use glycolic acid body scrub on the forearm lesion. - Consider cryotherapy if the lesion does not improve with topical treatment.  General Health Maintenance Making  positive lifestyle changes, including increased physical activity, improved dietary habits, and weight loss. Taking a daily baby aspirin for cardiovascular risk management. - Continue daily baby aspirin. - Encourage continued lifestyle modifications, including exercise and healthy diet.  Follow-up Advised to follow up in three months to reassess diabetes management and cardiovascular risk. - Schedule follow-up appointment in three months.        Total time spent with patient today was 45 minutes, this time was spent reviewing prior charts, labs, x-rays, discussing plan of care, and documenting the encounter.   Continue all other maintenance medications.  Follow up plan: Return in about 3 months (around 07/07/2024) for DM.   Continue healthy lifestyle choices, including diet (rich in fruits, vegetables, and lean proteins, and low in salt and simple carbohydrates) and exercise (at least 30 minutes of moderate physical activity daily).  Educational handout given for DM, ASCVD risk score information   The above assessment and management plan was discussed with the patient. The patient verbalized understanding of and has agreed to the management plan. Patient is aware to call the clinic if they develop any new symptoms or if symptoms persist or worsen. Patient is aware when to return to the clinic for a follow-up visit. Patient educated on when it is appropriate to go to the emergency department.   Rosaline Bruns, FNP-C [facility] [phone]

## 2024-04-07 ENCOUNTER — Ambulatory Visit: Payer: Self-pay | Admitting: Family Medicine

## 2024-04-07 ENCOUNTER — Telehealth: Payer: Self-pay

## 2024-04-07 LAB — LIPID PANEL
Chol/HDL Ratio: 5.9 ratio — ABNORMAL HIGH (ref 0.0–5.0)
Cholesterol, Total: 160 mg/dL (ref 100–199)
HDL: 27 mg/dL — ABNORMAL LOW (ref >39)
LDL Chol Calc (NIH): 99 mg/dL (ref 0–99)
Triglycerides: 193 mg/dL — ABNORMAL HIGH (ref 0–149)
VLDL Cholesterol Cal: 34 mg/dL (ref 5–40)

## 2024-04-07 NOTE — Progress Notes (Unsigned)
 Care Guide Pharmacy Note  04/07/2024 Name: Bryan Shepard. MRN: [id number] DOB: [birth year]  Referred By: Severa Rock HERO, FNP Reason for referral: Complex Care Management (Outreach to schedule with Pharm d )   Bryan FORBES [name] is a 68 y.o. year old male who is a primary care patient of Rakes, Rock HERO, FNP.  Bryan FORBES [name] was referred to the pharmacist for assistance related to: HTN and DMII  An unsuccessful telephone outreach was attempted today to contact the patient who was referred to the pharmacy team for assistance with medication management. Additional attempts will be made to contact the patient.  [name] , RMA     [facility]  [facility], [facility] Guide  Direct Dial: [phone]  Website: [email or web address]

## 2024-04-08 NOTE — Progress Notes (Signed)
 Care Guide Pharmacy Note  04/08/2024 Name: Bryan Shepard. MRN: [id number] DOB: [birth year]  Referred By: Severa Rock HERO, FNP Reason for referral: Complex Care Management (Outreach to schedule with Pharm d )   Bryan FORBES [name] is a 68 y.o. year old male who is a primary care patient of Rakes, Rock HERO, FNP.  Bryan FORBES [name] was referred to the pharmacist for assistance related to: DMII  Successful contact was made with the patient to discuss pharmacy services including being ready for the pharmacist to call at least 5 minutes before the scheduled appointment time and to have medication bottles and any blood pressure readings ready for review. The patient agreed to meet with the pharmacist via telephone visit on (date/time).05/05/2024  [name] , RMA     [name]  [facility], [facility] Guide  Direct Dial: [phone]  Website: [email or web address]

## 2024-04-12 ENCOUNTER — Encounter: Payer: Self-pay | Admitting: Nutrition

## 2024-04-12 ENCOUNTER — Encounter: Attending: Family Medicine | Admitting: Nutrition

## 2024-04-12 VITALS — Ht 67.0 in | Wt 202.6 lb

## 2024-04-12 DIAGNOSIS — E1169 Type 2 diabetes mellitus with other specified complication: Secondary | ICD-10-CM | POA: Diagnosis present

## 2024-04-12 DIAGNOSIS — N183 Chronic kidney disease, stage 3 unspecified: Secondary | ICD-10-CM | POA: Diagnosis not present

## 2024-04-12 DIAGNOSIS — E785 Hyperlipidemia, unspecified: Secondary | ICD-10-CM | POA: Insufficient documentation

## 2024-04-12 DIAGNOSIS — E1159 Type 2 diabetes mellitus with other circulatory complications: Secondary | ICD-10-CM | POA: Insufficient documentation

## 2024-04-12 DIAGNOSIS — E1122 Type 2 diabetes mellitus with diabetic chronic kidney disease: Secondary | ICD-10-CM | POA: Insufficient documentation

## 2024-04-12 DIAGNOSIS — E66811 Obesity, class 1: Secondary | ICD-10-CM | POA: Insufficient documentation

## 2024-04-12 DIAGNOSIS — I152 Hypertension secondary to endocrine disorders: Secondary | ICD-10-CM | POA: Insufficient documentation

## 2024-04-12 DIAGNOSIS — E0800 Diabetes mellitus due to underlying condition with hyperosmolarity without nonketotic hyperglycemic-hyperosmolar coma (NKHHC): Secondary | ICD-10-CM

## 2024-04-12 NOTE — Patient Instructions (Signed)
 Goals Established by Pt Work on cutting out simple sugars of honey and honey mustard/ bbq sauces. Increase vegetables and whole grains, dried beans, fruit Increase water to 4 bottles per day. Wear dexcom for 2 weeks to see how blood sugars are effected by what you eat and how you medication is working. Walk 30 minutes 3 times per week. Notify MD if blood sugars are over 200 consistently after 1 week. Get A1C down to 7%

## 2024-04-12 NOTE — Progress Notes (Signed)
 Medical Nutrition Therapy  Appointment Start time:  0930 Appointment End time:  1015 Primary concerns today: Dm Type 2  Referral diagnosis: E11.65 Preferred learning style: NO Preference Learning readiness: Ready    NUTRITION ASSESSMENT Follow up  He is working out twice a week with a Systems analyst. Drinking more water now.  Has cut out some of his soda. LImited with what vegetables he likes. Working on cutting out processed snacks.  He is a picky eater and doesn't like a lot of vegetables. A1C Up to 8.8% from 6.6%.SABRA Hasn't started medications yet but has been prescribed Metformin  500 mg BID. Will start today. Here with his wife. Doesn't have a CGM and would like to try a Dexcom. Sample given to wear for 14 days. He will talk to MD about a prescription for it. Snacks changed to eating fruit from sweets. Eating more salads. He tends to use honey in his oatmeal and coffee instead of table sugar. Eating some sugared cereals. Not measuring portions. Weight up 4-5 lbs since last visit. He notes he has lost weight on his scales at home. Didn't bring meter or testing supplies. His wife will be working on better meal planning. Sometimes eats late due to her work schedule.He may graze or eat out for lunch when she isn't home.  Upon returning to home, wife notes his BS at home on Dexcom was 274 mg/dl. They will start Metformin  today.  Wt Readings from Last 3 Encounters:  04/12/24 202 lb 9.6 oz (91.9 kg)  04/06/24 198 lb (89.8 kg)  03/18/24 197 lb (89.4 kg)   Ht Readings from Last 3 Encounters:  04/12/24 5' 7 (1.702 m)  04/06/24 5' 7 (1.702 m)  03/18/24 5' 7 (1.702 m)   Body mass index is 31.73 kg/m. @BMIFA @ Facility age limit for growth %iles is 20 years. Facility age limit for growth %iles is 20 years.  CGM Training:    Assessment:  Primary concerns today: Patient here for initiation of  Dexcom sample Continuous Glucose Monitoring.   Medications: Metformin  500 mg BID      Intervention:   Understanding Glucose Sensing Programming Sensor Information  High Glucose: 250 mg/dl  Low Glucose 70 mg/dl  Other settings to be added at follow up visit Starting [employer] of Product  Entering BG, Calibration Technique and Graphs with Public librarian and Alarms  He is willing to work on Lifestyle Medicine to improve  his DM and reduce risks of health issues with focusing on more whole plant based foods and 6 pillars of health.  Clinical Lab Results  Component Value Date   HGBA1C 8.8 (H) 04/06/2024      Latest Ref Rng & Units 10/03/2023   12:41 PM 07/04/2023   12:18 PM 01/01/2023   10:49 AM  CMP  Glucose 70 - 99 mg/dL 873  844  827   BUN 8 - 27 mg/dL 18  17  14    Creatinine 0.76 - 1.27 mg/dL 8.74  8.74  8.65   Sodium 134 - 144 mmol/L 139  137  143   Potassium 3.5 - 5.2 mmol/L 4.6  4.5  4.4   Chloride 96 - 106 mmol/L 104  100  104   CO2 20 - 29 mmol/L 22  24  23    Calcium 8.6 - 10.2 mg/dL 9.1  9.8  9.5   Total Protein 6.0 - 8.5 g/dL 6.6  6.7  6.4   Total Bilirubin 0.0 -  1.2 mg/dL 0.6  0.4  0.4   Alkaline Phos 44 - 121 IU/L 77  75  70   AST 0 - 40 IU/L 24  29  19    ALT 0 - 44 IU/L 38  46  29    Lipid Panel     Component Value Date/Time   CHOL 160 04/06/2024 1118   TRIG 193 (H) 04/06/2024 1118   HDL 27 (L) 04/06/2024 1118   CHOLHDL 5.9 (H) 04/06/2024 1118   LDLCALC 99 04/06/2024 1118   LABVLDL 34 04/06/2024 1118    Medical Hx: Past Medical History:  Diagnosis Date   Acid reflux    Colon polyps    Diabetes mellitus type 2 in nonobese (HCC) 12/13/2020   Family history of genetic mutation for hereditary nonpolyposis colorectal cancer (HNPCC)    Family history of prostate cancer    Family history of stomach cancer    HOH (hard of hearing)    Hypertension    Sleep apnea    CPAP   Stroke (HCC) 2015   TIA (transient ischemic attack) 2016    Medications:  Current Outpatient Medications  on File Prior to Visit  Medication Sig Dispense Refill   Accu-Chek FastClix Lancets MISC CHECK BS DAILY DX E11.9 100 each 3   aspirin 81 MG chewable tablet Chew 81 mg by mouth daily.     Blood Glucose Monitoring Suppl (ACCU-CHEK GUIDE ME) w/Device KIT CHECK BS DAILY DX E11.9 1 kit 0   enalapril  (VASOTEC ) 2.5 MG tablet Take 1 tablet (2.5 mg total) by mouth daily.     glucose blood (FREESTYLE LITE) test strip Check BS daily Dx E11.9 100 strip 3   No current facility-administered medications on file prior to visit.    Labs:  Lab Results  Component Value Date   HGBA1C 8.8 (H) 04/06/2024      Latest Ref Rng & Units 10/03/2023   12:41 PM 07/04/2023   12:18 PM 01/01/2023   10:49 AM  CMP  Glucose 70 - 99 mg/dL 873  844  827   BUN 8 - 27 mg/dL 18  17  14    Creatinine 0.76 - 1.27 mg/dL 8.74  8.74  8.65   Sodium 134 - 144 mmol/L 139  137  143   Potassium 3.5 - 5.2 mmol/L 4.6  4.5  4.4   Chloride 96 - 106 mmol/L 104  100  104   CO2 20 - 29 mmol/L 22  24  23    Calcium 8.6 - 10.2 mg/dL 9.1  9.8  9.5   Total Protein 6.0 - 8.5 g/dL 6.6  6.7  6.4   Total Bilirubin 0.0 - 1.2 mg/dL 0.6  0.4  0.4   Alkaline Phos 44 - 121 IU/L 77  75  70   AST 0 - 40 IU/L 24  29  19    ALT 0 - 44 IU/L 38  46  29    Lipid Panel     Component Value Date/Time   CHOL 160 04/06/2024 1118   TRIG 193 (H) 04/06/2024 1118   HDL 27 (L) 04/06/2024 1118   CHOLHDL 5.9 (H) 04/06/2024 1118   LDLCALC 99 04/06/2024 1118   LABVLDL 34 04/06/2024 1118   Wt Readings from Last 3 Encounters:  04/06/24 198 lb (89.8 kg)  03/18/24 197 lb (89.4 kg)  01/07/24 201 lb (91.2 kg)   Ht Readings from Last 3 Encounters:  04/06/24 5' 7 (1.702 m)  03/18/24 5' 7 (1.702 m)  01/07/24  5' 7 (1.702 m)   There is no height or weight on file to calculate BMI. @BMIFA @ Facility age limit for growth %iles is 20 years. Facility age limit for growth %iles is 20 years.   Notable Signs/Symptoms:  Lifestyle & Dietary Hx LIves with his wife.   Eats 2-3 meals per day  Estimated daily fluid intake: 40 oz Supplements:  Sleep:  Stress / self-care:  Current average weekly physical activity: Walks a little  24-Hr Dietary Recall B) banana, 1 tbsp pb, yogurt greek lite or    Honey bunches of oats with skim milk or Quaker oatmeal cooked with water with raw honey, L) 6in  sub from subway,grilled chicken honey mustard bbq chicken, chips cookie and 16 oz Pepsi. D) Salad with chicken, water   Estimated Energy Needs Calories: 1600 Carbohydrate: 180g Protein: 120g Fat: 44g   NUTRITION DIAGNOSIS  NB-1.1 Food and nutrition-related knowledge deficit As related to Diabetes.  As evidenced by A1C 7.6%.   NUTRITION INTERVENTION  Nutrition education (E-1) on the following topics:  Nutrition and Diabetes education provided on My Plate, CHO counting, meal planning, portion sizes, timing of meals, avoiding snacks between meals unless having a low blood sugar, target ranges for A1C and blood sugars, signs/symptoms and treatment of hyper/hypoglycemia, monitoring blood sugars, taking medications as prescribed, benefits of exercising 30 minutes per day and prevention of complications of DM.  Lifestyle Medicine  - Whole Food, Plant Predominant Nutrition is highly recommended: Eat Plenty of vegetables, Mushrooms, fruits, Legumes, Whole Grains, Nuts, seeds in lieu of processed meats, processed snacks/pastries red meat, poultry, eggs.    -It is better to avoid simple carbohydrates including: Cakes, Sweet Desserts, Ice Cream, Soda (diet and regular), Sweet Tea, Candies, Chips, Cookies, Store Bought Juices, Alcohol in Excess of  1-2 drinks a day, Lemonade,  Artificial Sweeteners, Doughnuts, Coffee Creamers, Sugar-free Products, etc, etc.  This is not a complete list.....  Exercise: If you are able: 30 -60 minutes a day ,4 days a week, or 150 minutes a week.  The longer the better.  Combine stretch, strength, and aerobic activities.  If you were told in  the past that you have high risk for cardiovascular diseases, you may seek evaluation by your heart doctor prior to initiating moderate to intense exercise programs.   Handouts Provided Include  Emotional eating   Learning Style & Readiness for Change Teaching method utilized: Visual & Auditory  Demonstrated degree of understanding via: Teach Back  Barriers to learning/adherence to lifestyle change: NOne  Goals Established by Pt Work on cutting out simple sugars of honey and honey mustard/ bbq sauces. Increase vegetables and whole grains, dried beans, fruit Increase water to 4 bottles per day. Wear dexcom for 2 weeks to see how blood sugars are effected by what you eat and how you medication is working. Walk 30 minutes 3 times per week. Notify MD if blood sugars are over 200 consistently after 1 week. Get A1C down to 7%   MONITORING & EVALUATION Dietary intake, weekly physical activity, and BS in 3 months.  If FBS are not less than 150 in am after 1 week, suggest increasing Metformin  1000 mg BID. Next Steps  Patient is to work on meal planning, meal prepping and avoiding snacks.SABRA

## 2024-04-28 NOTE — Progress Notes (Signed)
 This encounter was created in error - please disregard.

## 2024-05-05 ENCOUNTER — Encounter: Payer: Self-pay | Admitting: Family Medicine

## 2024-05-05 ENCOUNTER — Other Ambulatory Visit

## 2024-05-05 ENCOUNTER — Ambulatory Visit (INDEPENDENT_AMBULATORY_CARE_PROVIDER_SITE_OTHER): Admitting: Family Medicine

## 2024-05-05 VITALS — BP 131/68 | HR 81 | Temp 97.8°F | Ht 67.0 in | Wt 201.4 lb

## 2024-05-05 DIAGNOSIS — L989 Disorder of the skin and subcutaneous tissue, unspecified: Secondary | ICD-10-CM

## 2024-05-05 DIAGNOSIS — E1169 Type 2 diabetes mellitus with other specified complication: Secondary | ICD-10-CM

## 2024-05-05 MED ORDER — DEXCOM G7 SENSOR MISC: 1.0000 | 11 refills | Status: DC

## 2024-05-05 NOTE — Progress Notes (Signed)
 Subjective:  Patient ID: [name], male    DOB: 09/30/1956, 68 y.o.   MRN: [id number]  Patient Care Team: Severa Rock HERO, FNP as PCP - General (Family Medicine) Jude Harden GAILS, MD as Consulting Physician (Pulmonary Disease) [facility]   Chief Complaint:  lesion over right eye (Wife noticed and would like it looked at. )   HPI: Filbert Craze. is a 68 y.o. male presenting on 05/05/2024 for lesion over right eye (Wife noticed and would like it looked at. )   Kaydenn Mclear. is a 68 year old male who presents with a lesion in his eyebrow.  A lesion in his eyebrow was noticed by his wife about a week ago. He states that the lesion has been present for as long as he can remember and does not believe it has changed in size. He has not experienced any symptoms such as pain or irritation from the lesion.  He has seen a dermatologist in the past but does not recall the details of the visit or the dermatologist's name. It has been a while since his last dermatology appointment.  No change in size of the lesion.      Relevant past medical, surgical, family, and social history reviewed and updated as indicated.  Allergies and medications reviewed and updated. Data reviewed: Chart in Epic.   Past Medical History:  Diagnosis Date   Acid reflux    Colon polyps    Diabetes mellitus type 2 in nonobese (HCC) 12/13/2020   Family history of genetic mutation for hereditary nonpolyposis colorectal cancer (HNPCC)    Family history of prostate cancer    Family history of stomach cancer    HOH (hard of hearing)    Hypertension    Sleep apnea    CPAP   Stroke (HCC) 2015   TIA (transient ischemic attack) 2016    Past Surgical History:  Procedure Laterality Date   CHOLECYSTECTOMY N/A 04/17/2020   Procedure: LAPAROSCOPIC CHOLECYSTECTOMY;  Surgeon: Mavis Anes, MD;  Location: [facility];  Service: General;  Laterality: N/A;   COLONOSCOPY  2018    COLONOSCOPY WITH PROPOFOL  N/A 12/07/2021   Surgeon: Cindie Dunnings K, DO;   nonbleeding internal hemorrhoids, 8 mm polyp (tubular adenoma) in the descending colon removed.  Recommended 5-year surveillance.   POLYPECTOMY  12/07/2021   Procedure: POLYPECTOMY;  Surgeon: Cindie Dunnings POUR, DO;  Location: [facility] SU[facility];  Service: Endoscopy;;    Social History   Socioeconomic History   Marital status: Married    Spouse name: [place]    Number of children: Not on file   Years of education: Not on file   Highest education level: Bachelor's degree (e.g., BA, AB, BS)  Occupational History   Occupation: retired     Comment: [employer]   Tobacco Use   Smoking status: Never   Smokeless tobacco: Never  Vaping Use   Vaping status: Never Used  Substance and Sexual Activity   Alcohol use: No   Drug use: No   Sexual activity: Yes    Birth control/protection: None  Other Topics Concern   Not on file  Social History Narrative   Married , 1 step daughter    Social Drivers of Corporate investment banker Strain: Low Risk  (05/05/2024)   Overall Financial Resource Strain (CARDIA)    Difficulty of Paying Living Expenses: Not hard at all  Food Insecurity: No Food Insecurity (05/05/2024)  Hunger Vital Sign    Worried About Running Out of Food in the Last Year: Never true    Ran Out of Food in the Last Year: Never true  Transportation Needs: No Transportation Needs (05/05/2024)   PRAPARE - Administrator, Civil Service (Medical): No    Lack of Transportation (Non-Medical): No  Physical Activity: Sufficiently Active (05/05/2024)   Exercise Vital Sign    Days of Exercise per Week: 6 days    Minutes of Exercise per Session: 30 min  Recent Concern: Physical Activity - Insufficiently Active (03/18/2024)   Exercise Vital Sign    Days of Exercise per Week: 2 days    Minutes of Exercise per Session: 30 min  Stress: No Stress Concern Present (05/05/2024)   [employer] of Occupational Health -  Occupational Stress Questionnaire    Feeling of Stress: Not at all  Social Connections: Socially Integrated (05/05/2024)   Social Connection and Isolation Panel    Frequency of Communication with Friends and Family: More than three times a week    Frequency of Social Gatherings with Friends and Family: More than three times a week    Attends Religious Services: More than 4 times per year    Active Member of [employer] or Organizations: Yes    Attends Banker Meetings: More than 4 times per year    Marital Status: Married  Catering manager Violence: Not At Risk (03/18/2024)   Humiliation, Afraid, Rape, and Kick questionnaire    Fear of Current or Ex-Partner: No    Emotionally Abused: No    Physically Abused: No    Sexually Abused: No    Outpatient Encounter Medications as of 05/05/2024  Medication Sig   Accu-Chek FastClix Lancets MISC CHECK BS DAILY DX E11.9   aspirin 81 MG chewable tablet Chew 81 mg by mouth daily.   Blood Glucose Monitoring Suppl (ACCU-CHEK GUIDE ME) w/Device KIT CHECK BS DAILY DX E11.9   Continuous Glucose Sensor (DEXCOM G7 SENSOR) MISC 1 Device by Does not apply route every 14 (fourteen) days.   enalapril  (VASOTEC ) 2.5 MG tablet Take 1 tablet (2.5 mg total) by mouth daily.   glucose blood (FREESTYLE LITE) test strip Check BS daily Dx E11.9   No facility-administered encounter medications on file as of 05/05/2024.    No Known Allergies  Pertinent ROS per HPI, otherwise unremarkable      Objective:  BP 131/68   Pulse 81   Temp 97.8 F (36.6 C)   Ht 5' 7 (1.702 m)   Wt 201 lb 6.4 oz (91.4 kg)   SpO2 98%   BMI 31.54 kg/m    Wt Readings from Last 3 Encounters:  05/05/24 201 lb 6.4 oz (91.4 kg)  04/12/24 202 lb 9.6 oz (91.9 kg)  04/06/24 198 lb (89.8 kg)    Physical Exam Vitals and nursing note reviewed.  Constitutional:      General: He is not in acute distress.    Appearance: Normal appearance. He is not ill-appearing, toxic-appearing or  diaphoretic.  HENT:     Head: Normocephalic and atraumatic.     Nose: Nose normal.     Mouth/Throat:     Mouth: Mucous membranes are moist.  Eyes:     Pupils: Pupils are equal, round, and reactive to light.  Cardiovascular:     Rate and Rhythm: Normal rate.  Pulmonary:     Effort: Pulmonary effort is normal.  Skin:    General: Skin is warm and  dry.     Capillary Refill: Capillary refill takes less than 2 seconds.      Neurological:     General: No focal deficit present.     Mental Status: He is alert and oriented to person, place, and time.  Psychiatric:        Mood and Affect: Mood normal.        Behavior: Behavior normal.        Thought Content: Thought content normal.        Judgment: Judgment normal.      Results for orders placed or performed in visit on 04/06/24  Bayer DCA Hb A1c Waived   Collection Time: 04/06/24 11:16 AM  Result Value Ref Range   HB A1C (BAYER DCA - WAIVED) 8.8 (H) 4.8 - 5.6 %  Lipid panel   Collection Time: 04/06/24 11:18 AM  Result Value Ref Range   Cholesterol, Total 160 100 - 199 mg/dL   Triglycerides 806 (H) 0 - 149 mg/dL   HDL 27 (L) >60 mg/dL   VLDL Cholesterol Cal 34 5 - 40 mg/dL   LDL Chol Calc (NIH) 99 0 - 99 mg/dL   Chol/HDL Ratio 5.9 (H) 0.0 - 5.0 ratio       Pertinent labs & imaging results that were available during my care of the patient were reviewed by me and considered in my medical decision making.  Assessment & Plan:  Raphael was seen today for lesion over right eye.  Diagnoses and all orders for this visit:  Facial lesion -     Ambulatory referral to Dermatology  Type 2 diabetes mellitus with other specified complication, without long-term current use of insulin (HCC) -     Continuous Glucose Sensor (DEXCOM G7 SENSOR) MISC; 1 Device by Does not apply route every 14 (fourteen) days.     Facial mole (eyebrow) Lesion on the eyebrow, likely a mole with hairs protruding, noted approximately a week ago. No change in  size or appearance. Differential includes benign mole versus atypical lesion. - Refer to dermatology for evaluation of facial mole using a dermatoscope to assess for atypical cells and determine need for removal. - Discuss potential for scarring and eyebrow hair loss if removal is necessary.  Finger problem (pending orthopedic evaluation) Referral to [facility] was placed on the first of the month. He has not been contacted by the orthopedic office yet. - Send note to referrals coordinator to check on status of ortho referral.          Continue all other maintenance medications.  Follow up plan: Return if symptoms worsen or fail to improve.   Continue healthy lifestyle choices, including diet (rich in fruits, vegetables, and lean proteins, and low in salt and simple carbohydrates) and exercise (at least 30 minutes of moderate physical activity daily).   The above assessment and management plan was discussed with the patient. The patient verbalized understanding of and has agreed to the management plan. Patient is aware to call the clinic if they develop any new symptoms or if symptoms persist or worsen. Patient is aware when to return to the clinic for a follow-up visit. Patient educated on when it is appropriate to go to the emergency department.   Rosaline Bruns, FNP-C [facility] [phone]

## 2024-05-17 ENCOUNTER — Telehealth: Payer: Self-pay

## 2024-05-17 ENCOUNTER — Other Ambulatory Visit (HOSPITAL_COMMUNITY): Payer: Self-pay

## 2024-05-17 NOTE — Telephone Encounter (Signed)
 Pharmacy Patient Advocate Encounter   Received notification from Onbase that prior authorization for Dexcom G7 Sensor  is required/requested.   Insurance verification completed.   The patient is insured through [employer] .   Per test claim: PA required; PA submitted to above mentioned insurance via CoverMyMeds Key/confirmation #/EOC [id number] Status is pending

## 2024-05-18 NOTE — Telephone Encounter (Signed)
 Pharmacy Patient Advocate Encounter  Received notification from [employer] that Prior Authorization for Dexcom G7 Sensor  has been DENIED.  See denial reason below. No denial letter attached in CMM. Will attach denial letter to Media tab once received.   PA #/Case ID/Reference #: [id number]

## 2024-05-19 ENCOUNTER — Ambulatory Visit: Admitting: Nutrition

## 2024-05-19 NOTE — Telephone Encounter (Signed)
 Patient aware and states he will hold off for awhile on getting it.

## 2024-05-25 ENCOUNTER — Other Ambulatory Visit (HOSPITAL_COMMUNITY): Payer: Self-pay

## 2024-05-26 ENCOUNTER — Other Ambulatory Visit

## 2024-05-26 DIAGNOSIS — E119 Type 2 diabetes mellitus without complications: Secondary | ICD-10-CM

## 2024-05-26 NOTE — Progress Notes (Signed)
 05/26/2024 Name: Bryan Shepard. MRN: [id number] DOB: 10-Feb-1956  Chief Complaint  Patient presents with   Diabetes    Bryan Shepard. is a 68 y.o. year old male who presented for a telephone visit.  I connected with  Bryan Shepard Bryan Shepard. on 05/26/24 by telephone and verified that I am speaking with the correct person using two identifiers. I discussed the limitations of evaluation and management by telemedicine. The patient expressed understanding and agreed to proceed.  Patient was located in her home and PharmD in PCP office during this visit.   They were referred to the pharmacist by their PCP for assistance in managing diabetes.   Subjective:  [facility] (has to [facility] provider), but also uses our practice for care.  He is interested in naturally managing his diabetes through diet and lifestyle changes.  He is not currently on medication for his T2DM.    Care Team: Primary Care Provider: Severa Rock HERO, FNP   Medication Access/Adherence  Current Pharmacy:  [facility] [phone] - [place], [place] - [address] [place] [address] Phone: [phone] Fax: [phone]   Patient reports affordability concerns with their medications: No  Patient reports access/transportation concerns to their pharmacy: No  Patient reports adherence concerns with their medications:  No     Diabetes:  Current medications: n/a Medications tried in the past: n/a  Current glucose readings: reports his blood sugar has been within range Discussed FBG<130 and PPBG <180 Has used Dexcom G7 samples in the past and enjoyed wearing them Using traditional glucometer as well  Current meal patterns:  Discussed meal planning options and Plate method for healthy eating Avoid sugary drinks and desserts Incorporate balanced protein, non starchy veggies, 1 serving of carbohydrate with each meal Increase water intake Increase physical activity as able   Current  physical activity: encouraged as able  Current medication access support: [facility]   Objective:  Lab Results  Component Value Date   HGBA1C 8.8 (H) 04/06/2024    Lab Results  Component Value Date   CREATININE 1.25 10/03/2023   BUN 18 10/03/2023   NA 139 10/03/2023   K 4.6 10/03/2023   CL 104 10/03/2023   CO2 22 10/03/2023    Lab Results  Component Value Date   CHOL 160 04/06/2024   HDL 27 (L) 04/06/2024   LDLCALC 99 04/06/2024   TRIG 193 (H) 04/06/2024   CHOLHDL 5.9 (H) 04/06/2024    Medications Reviewed Today     Reviewed by Billee Mliss BIRCH, [facility] (Pharmacist) on 05/26/24 at 0911  Med List Status: <None>   Medication Order Taking? Sig Documenting Provider Last Dose Status Informant  Accu-Chek FastClix Lancets MISC [id number]  CHECK BS DAILY DX E11.9 Bryan Rock HERO, FNP  Active   aspirin 81 MG chewable tablet [id number]  Chew 81 mg by mouth daily. [provider]  Active   Blood Glucose Monitoring Suppl (ACCU-CHEK GUIDE ME) w/Device KIT [id number]  CHECK BS DAILY DX E11.9 Rakes, Rock HERO, FNP  Active   Continuous Glucose Sensor (DEXCOM G7 SENSOR) MISC [id number]  1 Device by Does not apply route every 14 (fourteen) days. Bryan Rock HERO, FNP  Active   enalapril  (VASOTEC ) 2.5 MG tablet [id number]  Take 1 tablet (2.5 mg total) by mouth daily. Bryan Rock HERO, FNP  Active   glucose blood (FREESTYLE LITE) test strip [id number]  Check BS daily Dx E11.9 Bryan Rock HERO, FNP  Active  Assessment/Plan:   Diabetes: - Currently uncontrolled, but improving per patient report - Reviewed long term cardiovascular and renal outcomes of uncontrolled blood sugar - Reviewed goal A1c, goal fasting, and goal 2 hour post prandial glucose - Reviewed dietary modifications including FOLLOWING A HEART HEALTHY DIET/HEALTHY PLATE METHOD - Reviewed lifestyle modifications including: increased physical activity - Patient denies personal or family history of multiple endocrine neoplasia  type 2, medullary thyroid  cancer; personal history of pancreatitis or gallbladder disease. - Recommend to check glucose daily (fasting) or if symptomatic -Prefers diet and lifestyle modifications for T2DM management -Will leave Dexcom G7 CGM samples up front for patient (not covered under insurance due to not being on insulin)  Follow Up Plan: as needed, PCP 3-6 months  Mliss Tarry Griffin, PharmD, BCACP, CPP Clinical Pharmacist, [facility]

## 2024-05-27 ENCOUNTER — Other Ambulatory Visit (HOSPITAL_COMMUNITY): Payer: Self-pay

## 2024-05-27 ENCOUNTER — Telehealth: Payer: Self-pay | Admitting: Pharmacist

## 2024-05-27 ENCOUNTER — Encounter: Payer: Self-pay | Admitting: Pharmacist

## 2024-05-27 NOTE — Telephone Encounter (Signed)
   Dexcom G7 CGM samples left up front for patient to sample.  He would like to monitor blood sugar while making dietary/lifestyle changes.  His insurance will only cover CGM if on insulin.   Bryan Shepard Dattero Elisea Khader, PharmD, BCACP, CPP Clinical Pharmacist, [facility]

## 2024-06-16 ENCOUNTER — Ambulatory Visit: Admitting: Nutrition

## 2024-07-07 ENCOUNTER — Ambulatory Visit: Admitting: Family Medicine

## 2024-07-08 ENCOUNTER — Encounter: Payer: Self-pay | Admitting: Nutrition

## 2024-07-08 ENCOUNTER — Encounter: Attending: Family Medicine | Admitting: Nutrition

## 2024-07-08 VITALS — Ht 67.0 in | Wt 200.0 lb

## 2024-07-08 DIAGNOSIS — E1159 Type 2 diabetes mellitus with other circulatory complications: Secondary | ICD-10-CM | POA: Diagnosis present

## 2024-07-08 DIAGNOSIS — I152 Hypertension secondary to endocrine disorders: Secondary | ICD-10-CM | POA: Insufficient documentation

## 2024-07-08 DIAGNOSIS — E0800 Diabetes mellitus due to underlying condition with hyperosmolarity without nonketotic hyperglycemic-hyperosmolar coma (NKHHC): Secondary | ICD-10-CM | POA: Diagnosis present

## 2024-07-08 DIAGNOSIS — E1169 Type 2 diabetes mellitus with other specified complication: Secondary | ICD-10-CM | POA: Diagnosis present

## 2024-07-08 DIAGNOSIS — E66811 Obesity, class 1: Secondary | ICD-10-CM | POA: Insufficient documentation

## 2024-07-08 DIAGNOSIS — E785 Hyperlipidemia, unspecified: Secondary | ICD-10-CM | POA: Insufficient documentation

## 2024-07-08 NOTE — Progress Notes (Signed)
 Medical Nutrition Therapy  Appointment Start time:  [phone] Appointment End time: 1338 Primary concerns today: Dm Type 2  Referral diagnosis: E11.65 Preferred learning style: NO Preference Learning readiness: Ready    NUTRITION ASSESSMENT Follow up   Going to personal trainer 2 twice a week,; walks 1.3 miles per day. Has made a lot of dietary changes. Eating less processed foods and focusing on healthier foods. Eating more Salads. Has cut out whole milk, sweets and desserts. Has some dexcom  samples and has found it helpful in mangaging his diabetes. Drinking a lot of water Dexcom shows Avg Glucose 157 mg/dl.Bryan Shepard 83% TIR, 17 TAR. H/o Hyperlipidemia with elevated TG's. Not on statins. Will follow for next lab work. He is working on a higher fiber diet and cutting out saturated fats. Has lost a few pounds.   Medication list doesn't show he is on Metformin . Kidney function has improved. Goals Established by Pt last visit Work on cutting out simple sugars of honey and honey mustard/ bbq sauces. DOne Increase vegetables and whole grains, dried beans, fruit-done Increase water to 4 bottles per day.-done Wear dexcom for 2 weeks to see how blood sugars are effected by what you eat and how you medication is working.-done Walk 30 minutes 3 times per week.-doing Notify MD if blood sugars are over 200 consistently after 1 week. Get A1C down to 7%-    Wt Readings from Last 3 Encounters:  07/08/24 200 lb (90.7 kg)  05/05/24 201 lb 6.4 oz (91.4 kg)  04/12/24 202 lb 9.6 oz (91.9 kg)   Ht Readings from Last 3 Encounters:  07/08/24 5' 7 (1.702 m)  05/05/24 5' 7 (1.702 m)  04/12/24 5' 7 (1.702 m)   Body mass index is 31.32 kg/m. @BMIFA @ Facility age limit for growth %iles is 20 years. Facility age limit for growth %iles is 20 years.  He is willing to work on Lifestyle Medicine to improve  his DM and reduce risks of health issues with focusing on more whole plant based foods and 6 pillars of  health.  Clinical Lab Results  Component Value Date   HGBA1C 8.8 (H) 04/06/2024      Latest Ref Rng & Units 10/03/2023   12:41 PM 07/04/2023   12:18 PM 01/01/2023   10:49 AM  CMP  Glucose 70 - 99 mg/dL 873  844  827   BUN 8 - 27 mg/dL 18  17  14    Creatinine 0.76 - 1.27 mg/dL 8.74  8.74  8.65   Sodium 134 - 144 mmol/L 139  137  143   Potassium 3.5 - 5.2 mmol/L 4.6  4.5  4.4   Chloride 96 - 106 mmol/L 104  100  104   CO2 20 - 29 mmol/L 22  24  23    Calcium 8.6 - 10.2 mg/dL 9.1  9.8  9.5   Total Protein 6.0 - 8.5 g/dL 6.6  6.7  6.4   Total Bilirubin 0.0 - 1.2 mg/dL 0.6  0.4  0.4   Alkaline Phos 44 - 121 IU/L 77  75  70   AST 0 - 40 IU/L 24  29  19    ALT 0 - 44 IU/L 38  46  29    Lipid Panel     Component Value Date/Time   CHOL 160 04/06/2024 1118   TRIG 193 (H) 04/06/2024 1118   HDL 27 (L) 04/06/2024 1118   CHOLHDL 5.9 (H) 04/06/2024 1118   LDLCALC 99 04/06/2024 1118  LABVLDL 34 04/06/2024 1118    Medical Hx: Past Medical History:  Diagnosis Date   Acid reflux    Colon polyps    Diabetes mellitus type 2 in nonobese (HCC) 12/13/2020   Family history of genetic mutation for hereditary nonpolyposis colorectal cancer (HNPCC)    Family history of prostate cancer    Family history of stomach cancer    HOH (hard of hearing)    Hypertension    Sleep apnea    CPAP   Stroke (HCC) 2015   TIA (transient ischemic attack) 2016    Medications:  Current Outpatient Medications on File Prior to Visit  Medication Sig Dispense Refill   Accu-Chek FastClix Lancets MISC CHECK BS DAILY DX E11.9 100 each 3   aspirin 81 MG chewable tablet Chew 81 mg by mouth daily.     Blood Glucose Monitoring Suppl (ACCU-CHEK GUIDE ME) w/Device KIT CHECK BS DAILY DX E11.9 1 kit 0   Continuous Glucose Sensor (DEXCOM G7 SENSOR) MISC 1 Device by Does not apply route every 14 (fourteen) days. 1 each 11   enalapril  (VASOTEC ) 2.5 MG tablet Take 1 tablet (2.5 mg total) by mouth daily.     glucose blood  (FREESTYLE LITE) test strip Check BS daily Dx E11.9 100 strip 3   No current facility-administered medications on file prior to visit.    Labs:  Lab Results  Component Value Date   HGBA1C 8.8 (H) 04/06/2024      Latest Ref Rng & Units 10/03/2023   12:41 PM 07/04/2023   12:18 PM 01/01/2023   10:49 AM  CMP  Glucose 70 - 99 mg/dL 873  844  827   BUN 8 - 27 mg/dL 18  17  14    Creatinine 0.76 - 1.27 mg/dL 8.74  8.74  8.65   Sodium 134 - 144 mmol/L 139  137  143   Potassium 3.5 - 5.2 mmol/L 4.6  4.5  4.4   Chloride 96 - 106 mmol/L 104  100  104   CO2 20 - 29 mmol/L 22  24  23    Calcium 8.6 - 10.2 mg/dL 9.1  9.8  9.5   Total Protein 6.0 - 8.5 g/dL 6.6  6.7  6.4   Total Bilirubin 0.0 - 1.2 mg/dL 0.6  0.4  0.4   Alkaline Phos 44 - 121 IU/L 77  75  70   AST 0 - 40 IU/L 24  29  19    ALT 0 - 44 IU/L 38  46  29    Lipid Panel     Component Value Date/Time   CHOL 160 04/06/2024 1118   TRIG 193 (H) 04/06/2024 1118   HDL 27 (L) 04/06/2024 1118   CHOLHDL 5.9 (H) 04/06/2024 1118   LDLCALC 99 04/06/2024 1118   LABVLDL 34 04/06/2024 1118   Wt Readings from Last 3 Encounters:  05/05/24 201 lb 6.4 oz (91.4 kg)  04/12/24 202 lb 9.6 oz (91.9 kg)  04/06/24 198 lb (89.8 kg)   Ht Readings from Last 3 Encounters:  05/05/24 5' 7 (1.702 m)  04/12/24 5' 7 (1.702 m)  04/06/24 5' 7 (1.702 m)   There is no height or weight on file to calculate BMI. @BMIFA @ Facility age limit for growth %iles is 20 years. Facility age limit for growth %iles is 20 years.   Notable Signs/Symptoms:  Lifestyle & Dietary Hx LIves with his wife.  Eats 2-3 meals per day  Estimated daily fluid intake: 40 oz Supplements:  Sleep:  Stress / self-care:  Current average weekly physical activity: Walks a little  24-Hr Dietary Recall B) banana, 1 tbsp pb, yogurt greek lite or    Honey bunches of oats with skim milk or Quaker oatmeal cooked with water with raw honey, L) 6in  sub from subway,grilled chicken honey  mustard bbq chicken, chips cookie and 16 oz Pepsi. D) Salad with chicken, water   Estimated Energy Needs Calories: 1600 Carbohydrate: 180g Protein: 120g Fat: 44g   NUTRITION DIAGNOSIS  NB-1.1 Food and nutrition-related knowledge deficit As related to Diabetes.  As evidenced by A1C 7.6%.   NUTRITION INTERVENTION  Nutrition education (E-1) on the following topics:  Nutrition and Diabetes education provided on My Plate, CHO counting, meal planning, portion sizes, timing of meals, avoiding snacks between meals unless having a low blood sugar, target ranges for A1C and blood sugars, signs/symptoms and treatment of hyper/hypoglycemia, monitoring blood sugars, taking medications as prescribed, benefits of exercising 30 minutes per day and prevention of complications of DM.  Lifestyle Medicine  - Whole Food, Plant Predominant Nutrition is highly recommended: Eat Plenty of vegetables, Mushrooms, fruits, Legumes, Whole Grains, Nuts, seeds in lieu of processed meats, processed snacks/pastries red meat, poultry, eggs.    -It is better to avoid simple carbohydrates including: Cakes, Sweet Desserts, Ice Cream, Soda (diet and regular), Sweet Tea, Candies, Chips, Cookies, Store Bought Juices, Alcohol in Excess of  1-2 drinks a day, Lemonade,  Artificial Sweeteners, Doughnuts, Coffee Creamers, Sugar-free Products, etc, etc.  This is not a complete list.....  Exercise: If you are able: 30 -60 minutes a day ,4 days a week, or 150 minutes a week.  The longer the better.  Combine stretch, strength, and aerobic activities.  If you were told in the past that you have high risk for cardiovascular diseases, you may seek evaluation by your heart doctor prior to initiating moderate to intense exercise programs.   Handouts Provided Include  Emotional eating   Learning Style & Readiness for Change Teaching method utilized: Visual & Auditory  Demonstrated degree of understanding via: Teach Back  Barriers to  learning/adherence to lifestyle change: NOne  Goals  Keep up the great job! Continue to focus on whole plant based foods Keep exercising and drinking water Get A1C down to 6.5% or below Lose 2-4 lbs per month Increase exercise as tolerated. Get Triglycerides less than 70 mg/dl. for best cardiovascular health.   MONITORING & EVALUATION Dietary intake, weekly physical activity, and BS in 3 months.  Next Steps  Patient is to work on meal planning, meal prepping and avoiding snacks.Bryan Shepard

## 2024-07-08 NOTE — Patient Instructions (Signed)
 Goals  Keep up the great job! Continue to focus on whole plant based foods Keep exercising and drinking water Get A1C down to 6.5% or below Lose 2-4 lbs per month Increase exercise as tolerated. Get Triglycerides less than 70 mg/dl. for best cardiovascular health.

## 2024-07-14 ENCOUNTER — Ambulatory Visit: Admitting: Family Medicine

## 2024-08-02 ENCOUNTER — Ambulatory Visit: Admitting: Dermatology

## 2024-08-02 ENCOUNTER — Encounter: Payer: Self-pay | Admitting: Dermatology

## 2024-08-02 DIAGNOSIS — L570 Actinic keratosis: Secondary | ICD-10-CM | POA: Diagnosis not present

## 2024-08-02 DIAGNOSIS — L578 Other skin changes due to chronic exposure to nonionizing radiation: Secondary | ICD-10-CM | POA: Diagnosis not present

## 2024-08-02 DIAGNOSIS — W908XXA Exposure to other nonionizing radiation, initial encounter: Secondary | ICD-10-CM | POA: Diagnosis not present

## 2024-08-02 NOTE — Patient Instructions (Addendum)

## 2024-08-02 NOTE — Progress Notes (Unsigned)
   New Patient Visit   Subjective  Bryan Shepard. is a 68 y.o. male who presents for the following: growth  Pt has a growth on his face he'd like evaluated.pt has no hx of skin cancer but family hx of nmsc  The following portions of the chart were reviewed this encounter and updated as appropriate: medications, allergies, medical history  Review of Systems:  No other skin or systemic complaints except as noted in HPI or Assessment and Plan.  Objective  Well appearing patient in no apparent distress; mood and affect are within normal limits.  A focused examination was performed of the following areas: face  Relevant exam findings are noted in the Assessment and Plan.    Assessment & Plan     AK (ACTINIC KERATOSIS) Right Forehead Destruction of lesion - Right Forehead  Destruction method: cryotherapy     Return in about 2 months (around 10/02/2024) for AK f/u, tbse with brenda.  I, Darice Smock, CMA, am acting as scribe for RUFUS CHRISTELLA HOLY, MD.   Documentation: I have reviewed the above documentation for accuracy and completeness, and I agree with the above.  RUFUS CHRISTELLA HOLY, MD

## 2024-08-29 ENCOUNTER — Other Ambulatory Visit: Payer: Self-pay

## 2024-08-29 ENCOUNTER — Encounter (HOSPITAL_COMMUNITY): Payer: Self-pay | Admitting: Emergency Medicine

## 2024-08-29 ENCOUNTER — Emergency Department (HOSPITAL_COMMUNITY)

## 2024-08-29 ENCOUNTER — Inpatient Hospital Stay (HOSPITAL_COMMUNITY)
Admission: EM | Admit: 2024-08-29 | Discharge: 2024-09-05 | DRG: 234 | Disposition: A | Discharge status: Home / Self Care

## 2024-08-29 DIAGNOSIS — Z8059 Family history of malignant neoplasm of other urinary tract organ: Secondary | ICD-10-CM

## 2024-08-29 DIAGNOSIS — R0602 Shortness of breath: Principal | ICD-10-CM

## 2024-08-29 DIAGNOSIS — E669 Obesity, unspecified: Secondary | ICD-10-CM | POA: Diagnosis present

## 2024-08-29 DIAGNOSIS — R0902 Hypoxemia: Secondary | ICD-10-CM | POA: Diagnosis present

## 2024-08-29 DIAGNOSIS — Z8052 Family history of malignant neoplasm of bladder: Secondary | ICD-10-CM

## 2024-08-29 DIAGNOSIS — I129 Hypertensive chronic kidney disease with stage 1 through stage 4 chronic kidney disease, or unspecified chronic kidney disease: Secondary | ICD-10-CM | POA: Diagnosis present

## 2024-08-29 DIAGNOSIS — Z8249 Family history of ischemic heart disease and other diseases of the circulatory system: Secondary | ICD-10-CM | POA: Diagnosis not present

## 2024-08-29 DIAGNOSIS — S8012XA Contusion of left lower leg, initial encounter: Secondary | ICD-10-CM | POA: Diagnosis present

## 2024-08-29 DIAGNOSIS — I2510 Atherosclerotic heart disease of native coronary artery without angina pectoris: Secondary | ICD-10-CM | POA: Diagnosis present

## 2024-08-29 DIAGNOSIS — R471 Dysarthria and anarthria: Secondary | ICD-10-CM | POA: Diagnosis present

## 2024-08-29 DIAGNOSIS — I7121 Aneurysm of the ascending aorta, without rupture: Secondary | ICD-10-CM | POA: Diagnosis present

## 2024-08-29 DIAGNOSIS — N990 Postprocedural (acute) (chronic) kidney failure: Secondary | ICD-10-CM | POA: Diagnosis not present

## 2024-08-29 DIAGNOSIS — I252 Old myocardial infarction: Secondary | ICD-10-CM

## 2024-08-29 DIAGNOSIS — J9811 Atelectasis: Secondary | ICD-10-CM | POA: Diagnosis not present

## 2024-08-29 DIAGNOSIS — D696 Thrombocytopenia, unspecified: Secondary | ICD-10-CM | POA: Diagnosis present

## 2024-08-29 DIAGNOSIS — E1122 Type 2 diabetes mellitus with diabetic chronic kidney disease: Secondary | ICD-10-CM | POA: Diagnosis present

## 2024-08-29 DIAGNOSIS — I25110 Atherosclerotic heart disease of native coronary artery with unstable angina pectoris: Secondary | ICD-10-CM | POA: Diagnosis not present

## 2024-08-29 DIAGNOSIS — Z951 Presence of aortocoronary bypass graft: Principal | ICD-10-CM

## 2024-08-29 DIAGNOSIS — Z7982 Long term (current) use of aspirin: Secondary | ICD-10-CM

## 2024-08-29 DIAGNOSIS — E119 Type 2 diabetes mellitus without complications: Secondary | ICD-10-CM | POA: Diagnosis not present

## 2024-08-29 DIAGNOSIS — Z6831 Body mass index (BMI) 31.0-31.9, adult: Secondary | ICD-10-CM

## 2024-08-29 DIAGNOSIS — Z8673 Personal history of transient ischemic attack (TIA), and cerebral infarction without residual deficits: Secondary | ICD-10-CM

## 2024-08-29 DIAGNOSIS — N1831 Chronic kidney disease, stage 3a: Secondary | ICD-10-CM | POA: Diagnosis present

## 2024-08-29 DIAGNOSIS — Z801 Family history of malignant neoplasm of trachea, bronchus and lung: Secondary | ICD-10-CM

## 2024-08-29 DIAGNOSIS — Y848 Other medical procedures as the cause of abnormal reaction of the patient, or of later complication, without mention of misadventure at the time of the procedure: Secondary | ICD-10-CM | POA: Diagnosis present

## 2024-08-29 DIAGNOSIS — E1165 Type 2 diabetes mellitus with hyperglycemia: Secondary | ICD-10-CM | POA: Diagnosis present

## 2024-08-29 DIAGNOSIS — Z91199 Patient's noncompliance with other medical treatment and regimen due to unspecified reason: Secondary | ICD-10-CM

## 2024-08-29 DIAGNOSIS — D62 Acute posthemorrhagic anemia: Secondary | ICD-10-CM | POA: Diagnosis present

## 2024-08-29 DIAGNOSIS — Z79899 Other long term (current) drug therapy: Secondary | ICD-10-CM

## 2024-08-29 DIAGNOSIS — Z794 Long term (current) use of insulin: Secondary | ICD-10-CM | POA: Diagnosis not present

## 2024-08-29 DIAGNOSIS — E874 Mixed disorder of acid-base balance: Secondary | ICD-10-CM | POA: Diagnosis present

## 2024-08-29 DIAGNOSIS — I214 Non-ST elevation (NSTEMI) myocardial infarction: Principal | ICD-10-CM | POA: Diagnosis present

## 2024-08-29 DIAGNOSIS — Z8042 Family history of malignant neoplasm of prostate: Secondary | ICD-10-CM

## 2024-08-29 DIAGNOSIS — E118 Type 2 diabetes mellitus with unspecified complications: Secondary | ICD-10-CM | POA: Diagnosis not present

## 2024-08-29 DIAGNOSIS — E782 Mixed hyperlipidemia: Secondary | ICD-10-CM | POA: Diagnosis present

## 2024-08-29 DIAGNOSIS — Z808 Family history of malignant neoplasm of other organs or systems: Secondary | ICD-10-CM

## 2024-08-29 DIAGNOSIS — Z833 Family history of diabetes mellitus: Secondary | ICD-10-CM | POA: Diagnosis not present

## 2024-08-29 DIAGNOSIS — Z800 Family history of malignant neoplasm of digestive organs: Secondary | ICD-10-CM

## 2024-08-29 DIAGNOSIS — I10 Essential (primary) hypertension: Secondary | ICD-10-CM | POA: Diagnosis not present

## 2024-08-29 DIAGNOSIS — N179 Acute kidney failure, unspecified: Secondary | ICD-10-CM | POA: Diagnosis present

## 2024-08-29 DIAGNOSIS — M546 Pain in thoracic spine: Secondary | ICD-10-CM | POA: Diagnosis present

## 2024-08-29 DIAGNOSIS — M720 Palmar fascial fibromatosis [Dupuytren]: Secondary | ICD-10-CM | POA: Diagnosis present

## 2024-08-29 DIAGNOSIS — G4733 Obstructive sleep apnea (adult) (pediatric): Secondary | ICD-10-CM | POA: Diagnosis present

## 2024-08-29 DIAGNOSIS — Z823 Family history of stroke: Secondary | ICD-10-CM

## 2024-08-29 DIAGNOSIS — Z860100 Personal history of colon polyps, unspecified: Secondary | ICD-10-CM | POA: Diagnosis not present

## 2024-08-29 DIAGNOSIS — E785 Hyperlipidemia, unspecified: Secondary | ICD-10-CM | POA: Diagnosis not present

## 2024-08-29 LAB — CBC
HCT: 42.5 % (ref 39.0–52.0)
Hemoglobin: 14.5 g/dL (ref 13.0–17.0)
MCH: 29.1 pg (ref 26.0–34.0)
MCHC: 34.1 g/dL (ref 30.0–36.0)
MCV: 85.2 fL (ref 80.0–100.0)
Platelets: 183 K/uL (ref 150–400)
RBC: 4.99 MIL/uL (ref 4.22–5.81)
RDW: 13 % (ref 11.5–15.5)
WBC: 7.5 K/uL (ref 4.0–10.5)
nRBC: 0 % (ref 0.0–0.2)

## 2024-08-29 LAB — BASIC METABOLIC PANEL WITH GFR
Anion gap: 9 (ref 5–15)
BUN: 16 mg/dL (ref 8–23)
CO2: 25 mmol/L (ref 22–32)
Calcium: 8.6 mg/dL — ABNORMAL LOW (ref 8.9–10.3)
Chloride: 103 mmol/L (ref 98–111)
Creatinine, Ser: 1.24 mg/dL (ref 0.61–1.24)
GFR, Estimated: >60 mL/min (ref >60)
Glucose, Bld: 244 mg/dL — ABNORMAL HIGH (ref 70–99)
Potassium: 4 mmol/L (ref 3.5–5.1)
Sodium: 137 mmol/L (ref 135–145)

## 2024-08-29 LAB — PROTIME-INR
INR: 1 (ref 0.8–1.2)
Prothrombin Time: 13.4 s (ref 11.4–15.2)

## 2024-08-29 LAB — APTT: aPTT: 30 s (ref 24–36)

## 2024-08-29 LAB — D-DIMER, QUANTITATIVE: D-Dimer, Quant: 0.75 ug{FEU}/mL — ABNORMAL HIGH (ref 0.00–0.50)

## 2024-08-29 LAB — TROPONIN T, HIGH SENSITIVITY
Troponin T High Sensitivity: 100 ng/L (ref 0–19)
Troponin T High Sensitivity: 172 ng/L (ref 0–19)

## 2024-08-29 LAB — CBG MONITORING, ED: Glucose-Capillary: 212 mg/dL — ABNORMAL HIGH (ref 70–99)

## 2024-08-29 LAB — PRO BRAIN NATRIURETIC PEPTIDE: Pro Brain Natriuretic Peptide: 325 pg/mL — ABNORMAL HIGH (ref <300.0)

## 2024-08-29 MED ORDER — HEPARIN BOLUS VIA INFUSION
4000.0000 [IU] | Freq: Once | INTRAVENOUS | Status: AC
  Administered 2024-08-29: 4000 [IU] via INTRAVENOUS

## 2024-08-29 MED ORDER — LIDOCAINE 5 % EX PTCH
2.0000 | MEDICATED_PATCH | CUTANEOUS | Status: DC
  Administered 2024-08-29: 2 via TRANSDERMAL
  Filled 2024-08-29: qty 2

## 2024-08-29 MED ORDER — POLYETHYLENE GLYCOL 3350 17 G PO PACK: 17.0000 g | PACK | Freq: Every day | ORAL | Status: DC | PRN

## 2024-08-29 MED ORDER — MORPHINE SULFATE (PF) 2 MG/ML IV SOLN
2.0000 mg | INTRAVENOUS | Status: DC | PRN
Start: 2024-08-29 — End: 2024-09-01

## 2024-08-29 MED ORDER — PROCHLORPERAZINE EDISYLATE 10 MG/2ML IJ SOLN
5.0000 mg | Freq: Four times a day (QID) | INTRAMUSCULAR | Status: DC | PRN
  Administered 2024-08-29: 5 mg via INTRAVENOUS
  Filled 2024-08-29: qty 2

## 2024-08-29 MED ORDER — ATORVASTATIN CALCIUM 40 MG PO TABS
40.0000 mg | ORAL_TABLET | Freq: Every day | ORAL | Status: DC
  Filled 2024-08-29 (×2): qty 1

## 2024-08-29 MED ORDER — ASPIRIN 81 MG PO TBEC
81.0000 mg | DELAYED_RELEASE_TABLET | Freq: Every day | ORAL | Status: DC
  Administered 2024-08-30 – 2024-08-31 (×2): 81 mg via ORAL
  Filled 2024-08-29 (×2): qty 1

## 2024-08-29 MED ORDER — NITROGLYCERIN 0.4 MG SL SUBL
0.4000 mg | SUBLINGUAL_TABLET | Freq: Once | SUBLINGUAL | Status: AC
  Administered 2024-08-29: 0.4 mg via SUBLINGUAL
  Filled 2024-08-29: qty 1

## 2024-08-29 MED ORDER — OXYCODONE HCL 5 MG PO TABS
5.0000 mg | ORAL_TABLET | ORAL | Status: DC | PRN
  Administered 2024-08-30 – 2024-08-31 (×2): 5 mg via ORAL
  Filled 2024-08-29 (×2): qty 1

## 2024-08-29 MED ORDER — HEPARIN (PORCINE) 25000 UT/250ML-% IV SOLN
1300.0000 [IU]/h | INTRAVENOUS | Status: DC
  Administered 2024-08-29: 1050 [IU]/h via INTRAVENOUS
  Administered 2024-08-30: 1300 [IU]/h via INTRAVENOUS
  Filled 2024-08-29 (×2): qty 250

## 2024-08-29 MED ORDER — INSULIN ASPART 100 UNIT/ML IJ SOLN
0.0000 [IU] | Freq: Every day | INTRAMUSCULAR | Status: DC
  Administered 2024-08-29 – 2024-08-30 (×2): 2 [IU] via SUBCUTANEOUS
  Filled 2024-08-29: qty 1
  Filled 2024-08-29: qty 2

## 2024-08-29 MED ORDER — MELATONIN 5 MG PO TABS: 5.0000 mg | ORAL_TABLET | Freq: Every evening | ORAL | Status: DC | PRN

## 2024-08-29 MED ORDER — ACETAMINOPHEN 500 MG PO TABS: 500.0000 mg | ORAL_TABLET | Freq: Four times a day (QID) | ORAL | Status: DC | PRN

## 2024-08-29 MED ORDER — INSULIN ASPART 100 UNIT/ML IJ SOLN
0.0000 [IU] | Freq: Three times a day (TID) | INTRAMUSCULAR | Status: DC
  Administered 2024-08-30: 4 [IU] via SUBCUTANEOUS
  Administered 2024-08-30: 7 [IU] via SUBCUTANEOUS
  Administered 2024-08-31 (×3): 4 [IU] via SUBCUTANEOUS
  Filled 2024-08-29: qty 4
  Filled 2024-08-29: qty 1
  Filled 2024-08-29: qty 4
  Filled 2024-08-29: qty 1
  Filled 2024-08-29: qty 4

## 2024-08-29 MED ORDER — IOHEXOL 350 MG/ML SOLN
75.0000 mL | Freq: Once | INTRAVENOUS | Status: AC | PRN
  Administered 2024-08-29: 75 mL via INTRAVENOUS

## 2024-08-29 NOTE — ED Provider Notes (Signed)
 Patient turned over at shift change from [place] PA:  Patient has a history of TIA/CVA, diabetes presents with complaints of intermittent chest pain since last night. Patient states that the pain is centrally located and sometimes travels to his left arm.  Patient has mild wax/wane associated shortness of breath and dizziness.  Patient took four baby aspirin  prior to ED arrival and is currently resting comfortably without pain.  Pending workup initiated by [facility], disposition to be decided after final results. Physical Exam  BP (!) 162/93   Pulse 80   Temp 99 F (37.2 C) (Oral)   Resp (!) 21   Ht 5' 7 (1.702 m)   Wt 90.7 kg   SpO2 98%   BMI 31.32 kg/m   Physical Exam Vitals and nursing note reviewed.  Constitutional:      General: He is not in acute distress.    Appearance: Normal appearance.  HENT:     Head: Normocephalic and atraumatic.  Eyes:     Extraocular Movements: Extraocular movements intact.     Conjunctiva/sclera: Conjunctivae normal.     Pupils: Pupils are equal, round, and reactive to light.  Cardiovascular:     Rate and Rhythm: Normal rate and regular rhythm.     Pulses: Normal pulses.  Pulmonary:     Effort: Pulmonary effort is normal. No respiratory distress.  Abdominal:     General: Abdomen is flat.     Palpations: Abdomen is soft.     Tenderness: There is no abdominal tenderness.  Musculoskeletal:        General: Normal range of motion.     Cervical back: Normal range of motion.     Right lower leg: 1+ Pitting Edema present.     Left lower leg: 1+ Pitting Edema present.  Skin:    General: Skin is warm and dry.     Capillary Refill: Capillary refill takes less than 2 seconds.  Neurological:     General: No focal deficit present.     Mental Status: He is alert. Mental status is at baseline.  Psychiatric:        Mood and Affect: Mood normal.     Procedures  Procedures  ED Course / MDM   Clinical Course as of 08/29/24 1851  Sun Aug 29, 2024  1819 Patient with multiple cardiovascular risk factors evaluated for complaints of chest pain that began last night.  Does sound like he has been having some intermittent similar symptoms over the past several months while working out.  Does not associate with any URI symptoms.  Upon arrival he is hypertensive, appears comfortable.  His lungs are clear.  Will obtain cardiac workup.  Will add on dimer given unilateral leg swelling and recent travel. [JT]  1830 CBC Unremarkable [JT]  1830 DG Chest Port 1 View No acute cardiopulmonary disease [JT]  1830 EKG 12-Lead Normal sinus rhythm without ischemic changes [JT]  1850 D-dimer, quantitative(!) Elevated to 0.75 [JT]    Clinical Course User Index [JT] Donnajean Lynwood DEL, PA-C   Medical Decision Making Amount and/or Complexity of Data Reviewed Labs: ordered. Decision-making details documented in ED Course. Radiology: ordered. Decision-making details documented in ED Course. ECG/medicine tests:  Decision-making details documented in ED Course.  Risk Prescription drug management. Decision regarding hospitalization.   Patient presents to the ED for concern of pain, this involves an extensive number of treatment options, and is a complaint that carries with it a high risk of complications and morbidity.  The differential diagnosis includes: ACS - NSTEMI PE Nonacute MSK etiology  Co morbidities that complicate the patient evaluation: CVA/TIA Hypertension  Additional history obtained: Additional history obtained from [facility], Outside Medical Records, and Past Admission.  Wife is present as a good historian at bedside for duration of visit. External records from outside source obtained and reviewed including medical history, surgical history, allergies, medications.  Lab Tests: I ordered, and personally interpreted labs.   The pertinent results include:  Troponin 1 - 100 Troponin 2 - 172 APTT -90 BMP-no acute  findings BNP-325 D-dimer-0.75 CBC-no acute findings  Imaging Studies ordered: I ordered imaging studies including: DG Chest CT Angio PE I independently visualized and interpreted imaging which showed: No evidence of pulmonary embolism. Mild dilatation of the ascending aorta measuring 4.2 cm with atherosclerotic calcifications I agree with the radiologist interpretation  Cardiac Monitoring: The patient was maintained on a cardiac monitor.  I personally viewed and interpreted the cardiac monitored which showed an underlying rhythm of: Normal sinus rhythm  Medicines ordered and prescription drug management: I ordered medications: Nitroglycerin   Heparin   reevaluation of the patient after these medicines showed that the patient improved I have reviewed the patients home medicines and have made adjustments as needed  Consultations Obtained:   Consultation was obtained from cardiology regarding the evaluation and management of this patient's elevated troponin. The consultant was contacted by phone, and the case was discussed in detail, including relevant history, physical findings, and diagnostic results.  Cardiology recommended admission and this was relayed to the hospitalist admitting team, who agreed with admission and continuation of care plan.  Problem List / ED Course: Problem List: Chest Pain Emergency Department Course: The patient presented with chest pain. Initial assessment included history, physical exam, and review of prior medical records.   Dispostion: Admission

## 2024-08-29 NOTE — ED Provider Notes (Signed)
 [place] EMERGENCY DEPARTMENT AT [facility] Provider Note   CSN: [id number] Arrival date & time: 08/29/24  1715     Patient presents with: No chief complaint on file.   Bryan Shepard. is a 68 y.o. male history of diabetes, hypertension, TIA/CVA presents with complaints of intermittent chest pain since last night.  Is associated with some shortness of breath and dizziness.  No nausea or vomiting.  Is centrally located, associated with some left arm pain.  No obvious aggravating or relieving factors.  Has noted that his blood pressure has been up.  No cough or URI symptoms.   HPI    Past Medical History:  Diagnosis Date   Acid reflux    Colon polyps    Diabetes mellitus type 2 in nonobese (HCC) 12/13/2020   Family history of genetic mutation for hereditary nonpolyposis colorectal cancer (HNPCC)    Family history of prostate cancer    Family history of stomach cancer    HOH (hard of hearing)    Hypertension    Sleep apnea    CPAP   Stroke (HCC) 2015   TIA (transient ischemic attack) 2016   Past Surgical History:  Procedure Laterality Date   CHOLECYSTECTOMY N/A 04/17/2020   Procedure: LAPAROSCOPIC CHOLECYSTECTOMY;  Surgeon: Mavis Anes, MD;  Location: [facility] ORS;  Service: General;  Laterality: N/A;   COLONOSCOPY  2018   COLONOSCOPY WITH PROPOFOL  N/A 12/07/2021   Surgeon: Cindie Dunnings K, DO;   nonbleeding internal hemorrhoids, 8 mm polyp (tubular adenoma) in the descending colon removed.  Recommended 5-year surveillance.   POLYPECTOMY  12/07/2021   Procedure: POLYPECTOMY;  Surgeon: Cindie Dunnings POUR, DO;  Location: [facility] ENDO SUITE;  Service: Endoscopy;;     Prior to Admission medications   Medication Sig Start Date End Date Taking? Authorizing Provider  Accu-Chek FastClix Lancets MISC CHECK BS DAILY DX E11.9 01/07/24   Severa Rock HERO, FNP  aspirin  81 MG chewable tablet Chew 81 mg by mouth daily.    [provider]  Blood Glucose Monitoring Suppl  (ACCU-CHEK GUIDE ME) w/Device KIT CHECK BS DAILY DX E11.9 07/07/23   Rakes, Rock HERO, FNP  Continuous Glucose Sensor (DEXCOM G7 SENSOR) MISC 1 Device by Does not apply route every 14 (fourteen) days. 05/05/24   Severa Rock HERO, FNP  enalapril  (VASOTEC ) 2.5 MG tablet Take 1 tablet (2.5 mg total) by mouth daily. 04/06/24   Severa Rock HERO, FNP  glucose blood (FREESTYLE LITE) test strip Check BS daily Dx E11.9 01/07/24   Severa Rock HERO, FNP    Allergies: Patient has no known allergies.    Review of Systems  Cardiovascular:  Positive for chest pain.    Updated Vital Signs BP (!) 162/93   Pulse 80   Temp 99 F (37.2 C) (Oral)   Resp (!) 21   Ht 5' 7 (1.702 m)   Wt 90.7 kg   SpO2 98%   BMI 31.32 kg/m   Physical Exam Vitals and nursing note reviewed.  Constitutional:      General: He is not in acute distress.    Appearance: He is well-developed.  HENT:     Head: Normocephalic and atraumatic.  Eyes:     Conjunctiva/sclera: Conjunctivae normal.  Cardiovascular:     Rate and Rhythm: Normal rate and regular rhythm.     Heart sounds: No murmur heard. Pulmonary:     Effort: Pulmonary effort is normal. No respiratory distress.     Breath sounds: Normal  breath sounds.  Abdominal:     Palpations: Abdomen is soft.     Tenderness: There is no abdominal tenderness.  Musculoskeletal:        General: Swelling present.     Cervical back: Neck supple.     Comments: Trace right lower extremity edema, pulses intact, no tenderness  Skin:    General: Skin is warm and dry.     Capillary Refill: Capillary refill takes less than 2 seconds.  Neurological:     Mental Status: He is alert.  Psychiatric:        Mood and Affect: Mood normal.     (all labs ordered are listed, but only abnormal results are displayed) Labs Reviewed  D-DIMER, QUANTITATIVE - Abnormal; Notable for the following components:      Result Value   D-Dimer, Quant 0.75 (*)    All other components within normal limits  CBC   BASIC METABOLIC PANEL WITH GFR  PRO BRAIN NATRIURETIC PEPTIDE  TROPONIN T, HIGH SENSITIVITY    EKG: None  Radiology: DG Chest Port 1 View Result Date: 08/29/2024 EXAM: 1 VIEW(S) XRAY OF THE CHEST 08/29/2024 06:02:00 PM COMPARISON: 11/13/2014 CLINICAL HISTORY: CP FINDINGS: LUNGS AND PLEURA: No focal pulmonary opacity. No pleural effusion. No pneumothorax. HEART AND MEDIASTINUM: No acute abnormality of the cardiac and mediastinal silhouettes. BONES AND SOFT TISSUES: No acute osseous abnormality. IMPRESSION: 1. No acute cardiopulmonary process. Electronically signed by: Franky Crease MD 08/29/2024 06:07 PM EST RP Workstation: [id number]     Procedures   Medications Ordered in the ED - No data to display  Clinical Course as of 08/29/24 1850  Sun Aug 29, 2024  1819 Patient with multiple cardiovascular risk factors evaluated for complaints of chest pain that began last night.  Does sound like he has been having some intermittent similar symptoms over the past several months while working out.  Does not associate with any URI symptoms.  Upon arrival he is hypertensive, appears comfortable.  His lungs are clear.  Will obtain cardiac workup.  Will add on dimer given unilateral leg swelling and recent travel. [JT]  1830 CBC Unremarkable [JT]  1830 DG Chest Port 1 View No acute cardiopulmonary disease [JT]  1830 EKG 12-Lead Normal sinus rhythm without ischemic changes [JT]  1850 D-dimer, quantitative(!) Elevated to 0.75 [JT]    Clinical Course User Index [JT] Donnajean Lynwood DEL, PA-C                                 Medical Decision Making Amount and/or Complexity of Data Reviewed Labs: ordered. Radiology: ordered.   This patient presents to the ED with chief complaint(s) of chest pain.  The complaint involves an extensive differential diagnosis and also carries with it a high risk of complications and morbidity.   Pertinent past medical history as listed in HPI  The differential  diagnosis includes  Aortic dissection, ACS, PE, pneumonia, pneumothorax  Additional history obtained: Records reviewed Care Everywhere/External Records  Disposition:   Signout given to Megan Lloyd, PA-C.  Please see her note for remainder the visit.  Disposition pending workup.  Social Determinants of Health:   none  This note was dictated with voice recognition software.  Despite best efforts at proofreading, errors may have occurred which can change the documentation meaning.       Final diagnoses:  Shortness of breath    ED Discharge Orders     None  Bryan Shepard, Sakai, PA-C 08/29/24 1851    Simon Lavonia SAILOR, MD 08/29/24 2056

## 2024-08-29 NOTE — ED Triage Notes (Signed)
 Pt c/o chest pain since last night. He took 4 baby aspirin  at home.

## 2024-08-29 NOTE — Progress Notes (Signed)
 PHARMACY - ANTICOAGULATION CONSULT NOTE  Pharmacy Consult for Heparin  Infusion Indication: chest pain/ACS  No Known Allergies  Patient Measurements: Height: 5' 7 (170.2 cm) Weight: 90.7 kg (199 lb 15.3 oz) IBW/kg (Calculated) : 66.1 HEPARIN  DW (KG): 85  Vital Signs: Temp: 99 F (37.2 C) (11/23 1740) Temp Source: Oral (11/23 1740) BP: 174/90 (11/23 1900) Pulse Rate: 78 (11/23 1900)  Labs: Recent Labs    08/29/24 1813  HGB 14.5  HCT 42.5  PLT 183  CREATININE 1.24    Estimated Creatinine Clearance: 62.1 mL/min (by C-G formula based on SCr of 1.24 mg/dL).   Medical History: Past Medical History:  Diagnosis Date   Acid reflux    Colon polyps    Diabetes mellitus type 2 in nonobese (HCC) 12/13/2020   Family history of genetic mutation for hereditary nonpolyposis colorectal cancer (HNPCC)    Family history of prostate cancer    Family history of stomach cancer    HOH (hard of hearing)    Hypertension    Sleep apnea    CPAP   Stroke (HCC) 2015   TIA (transient ischemic attack) 2016    Medications:  Not on anticoagulation at home per chart review  Assessment: Patient is a 68 year old male with a past medical history of diabetes, HTN, and TIA/CVA who presented with intermittent chest pain since last night. Also reports shortness of breath and dizziness. Troponin level elevated at 100>172. Pharmacy was consulted to start this patient on a heparin  infusion for ACS/STEMI.  Baseline INR and aPTT ordered. Hgb 14.5. PLT 183. No signs/symptoms of bleeding noted in chart.  Goal of Therapy:  Heparin  level 0.3-0.7 units/ml Monitor platelets by anticoagulation protocol: Yes   Plan:  Give 4000 unit bolus x 1 Start heparin  infusion at a rate of 1050 units/hr Check heparin  level in 6 hours Monitor CBC daily while on heparin   Lum VEAR Mania, PharmD 08/29/2024,9:15 PM

## 2024-08-29 NOTE — ED Notes (Signed)
 Patient transported to CT

## 2024-08-29 NOTE — H&P (Incomplete)
 History and Physical  Bryan Shepard. FMW:[id number] DOB: 07/25/1956 DOA: 08/29/2024  Referring physician: Duwaine Mayans, PA-EDP  PCP: Severa Rock HERO, FNP  Outpatient Specialists: None. Patient coming from: Home.  Chief Complaint: Chest pain.  HPI: Bryan Shepard. is a 68 y.o. male with medical history significant for hypertension, type 2 diabetes, GERD, prior CVA, OSA, who presents to the ER with complaints of intermittent chest pain, since last night.  Centrally located, radiating to left arm, no aggravating or relieving factors.  Associated with shortness of breath and dizziness.  He took 4 doses of baby aspirin  81 mg prior to presenting to the ER.  In the ER, blood pressure significantly elevated with SBP in the 190s and DBP's in the 90s.  Lab studies was notable for elevated D-dimer 0.75.  CT angio PE revealed no evidence of pulmonary embolism, mild dilatation of the ascending aorta measuring 4.2 cm with atherosclerotic calcifications.  Recommended annual imaging follow-up by CT angio or MRI.  No evidence of acute ischemia on twelve-lead EKG however, high-sensitivity troponin was elevated 100, repeat 172.  The patient was started on heparin  drip.  EDP discussed the case with cardiology who recommended transfer to [facility] for further management of NSTEMI.  Admitted by [facility], hospitalist service.  ED Course: Temperature 99.  BP 192/88, pulse 88, respiration rate 21, O2 saturation 98% on room air.  Review of Systems: Review of systems as noted in the HPI. All other systems reviewed and are negative.   Past Medical History:  Diagnosis Date   Acid reflux    Colon polyps    Diabetes mellitus type 2 in nonobese (HCC) 12/13/2020   Family history of genetic mutation for hereditary nonpolyposis colorectal cancer (HNPCC)    Family history of prostate cancer    Family history of stomach cancer    HOH (hard of hearing)    Hypertension    Sleep apnea    CPAP   Stroke (HCC)  2015   TIA (transient ischemic attack) 2016   Past Surgical History:  Procedure Laterality Date   CHOLECYSTECTOMY N/A 04/17/2020   Procedure: LAPAROSCOPIC CHOLECYSTECTOMY;  Surgeon: Mavis Anes, MD;  Location: [facility] ORS;  Service: General;  Laterality: N/A;   COLONOSCOPY  2018   COLONOSCOPY WITH PROPOFOL  N/A 12/07/2021   Surgeon: Cindie Dunnings K, DO;   nonbleeding internal hemorrhoids, 8 mm polyp (tubular adenoma) in the descending colon removed.  Recommended 5-year surveillance.   POLYPECTOMY  12/07/2021   Procedure: POLYPECTOMY;  Surgeon: Cindie Dunnings POUR, DO;  Location: [facility] END[facility];  Service: Endoscopy;;    Social History:  reports that he has never smoked. He has never used smokeless tobacco. He reports that he does not drink alcohol and does not use drugs.   No Known Allergies  Family History  Problem Relation Age of Onset   Melanoma Mother 93   Hypertension Mother    Transient ischemic attack Mother    Stroke Mother    Heart disease Mother    Prostate cancer Father 61   Stomach cancer Father 53       PMS2+; Lynch syndrome   Melanoma Father    Diabetes Father    Heart disease Father    Stroke Maternal Grandmother    Lung cancer Paternal Grandmother    Throat cancer Paternal Grandmother    Prostate cancer Paternal Grandfather 34   Bladder Cancer Other        MGM's mother with urethra cancer  Prior to Admission medications   Medication Sig Start Date End Date Taking? Authorizing Provider  Accu-Chek FastClix Lancets MISC CHECK BS DAILY DX E11.9 01/07/24   Severa Rock HERO, FNP  aspirin  81 MG chewable tablet Chew 81 mg by mouth daily.    [provider]  Blood Glucose Monitoring Suppl (ACCU-CHEK GUIDE ME) w/Device KIT CHECK BS DAILY DX E11.9 07/07/23   Rakes, Rock HERO, FNP  Continuous Glucose Sensor (DEXCOM G7 SENSOR) MISC 1 Device by Does not apply route every 14 (fourteen) days. 05/05/24   Severa Rock HERO, FNP  enalapril  (VASOTEC ) 2.5 MG tablet Take 1 tablet  (2.5 mg total) by mouth daily. 04/06/24   Severa Rock HERO, FNP  glucose blood (FREESTYLE LITE) test strip Check BS daily Dx E11.9 01/07/24   Severa Rock HERO, FNP    Physical Exam: BP (!) 174/90   Pulse 78   Temp 99 F (37.2 C) (Oral)   Resp 12   Ht 5' 7 (1.702 m)   Wt 90.7 kg   SpO2 97%   BMI 31.32 kg/m   General: 68 y.o. year-old male well developed well nourished in no acute distress.  Alert and oriented x3. Cardiovascular: Regular rate and rhythm with no rubs or gallops.  No thyromegaly or JVD noted.  No lower extremity edema. 2/4 pulses in all 4 extremities. Respiratory: Clear to auscultation with no wheezes or rales. Good inspiratory effort. Abdomen: Soft nontender nondistended with normal bowel sounds x4 quadrants. Muskuloskeletal: No cyanosis, clubbing or edema noted bilaterally Neuro: CN II-XII intact, strength, sensation, reflexes Skin: No ulcerative lesions noted or rashes Psychiatry: Judgement and insight appear normal. Mood is appropriate for condition and setting          Labs on Admission:  Basic Metabolic Panel: Recent Labs  Lab 08/29/24 1813  NA 137  K 4.0  CL 103  CO2 25  GLUCOSE 244*  BUN 16  CREATININE 1.24  CALCIUM  8.6*   Liver Function Tests: No results for input(s): AST, ALT, ALKPHOS, BILITOT, PROT, ALBUMIN  in the last 168 hours. No results for input(s): LIPASE, AMYLASE in the last 168 hours. No results for input(s): AMMONIA in the last 168 hours. CBC: Recent Labs  Lab 08/29/24 1813  WBC 7.5  HGB 14.5  HCT 42.5  MCV 85.2  PLT 183   Cardiac Enzymes: No results for input(s): CKTOTAL, CKMB, CKMBINDEX, TROPONINI in the last 168 hours.  BNP (last 3 results) No results for input(s): BNP in the last 8760 hours.  ProBNP (last 3 results) Recent Labs    08/29/24 1813  PROBNP 325.0*    CBG: No results for input(s): GLUCAP in the last 168 hours.  Radiological Exams on Admission: CT Angio Chest PE W and/or Wo  Contrast Result Date: 08/29/2024 EXAM: CTA of the Chest with contrast for PE 08/29/2024 07:36:01 PM TECHNIQUE: CTA of the chest was performed after the administration of intravenous contrast. 75 ml omnipaque  350 Multiplanar reformatted images are provided for review. MIP images are provided for review. Automated exposure control, iterative reconstruction, and/or weight based adjustment of the mA/kV was utilized to reduce the radiation dose to as low as reasonably achievable. COMPARISON: Chest x-ray from 08/29/2024. CLINICAL HISTORY: Chest pain. FINDINGS: PULMONARY ARTERIES: No intraluminal filling defect to suggest pulmonary embolism is noted. Main pulmonary artery is normal in caliber. The pulmonary artery shows a normal branching pattern bilaterally. MEDIASTINUM: The heart is not significantly enlarged. Scattered coronary calcifications are noted. The thoracic aorta shows atherosclerotic calcifications. Mild  dilatation of the ascending aorta to 4.2 cm is noted. The thoracic inlet is within normal limits. The esophagus, as visualized is within normal limits. LYMPH NODES: No mediastinal, hilar or axillary lymphadenopathy. LUNGS AND PLEURA: The lungs are well aerated bilaterally. No focal infiltrate or effusion is seen. No pneumothorax. UPPER ABDOMEN: The upper abdomen is unremarkable. Prior cholecystectomy is seen. SOFT TISSUES AND BONES: Degenerative changes of the thoracic spine are seen. No rib abnormality is noted. No acute soft tissue abnormality. IMPRESSION: 1. No evidence of pulmonary embolism. 2. 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 This recommendation follows 2010 ACCF/AHA/AATS/ACR/ASA/SCA/SCAI/SIR/STS/SVM Guidelines for the Diagnosis and Management of Patients with Thoracic Aortic Disease. Circulation. 2010; 121: Z733-z630. Aortic aneurysm NOS (ICD10-I71.9) Electronically signed by: Oneil Devonshire MD  08/29/2024 07:45 PM EST RP Workstation: [id number]   DG Chest Port 1 View Result Date: 08/29/2024 EXAM: 1 VIEW(S) XRAY OF THE CHEST 08/29/2024 06:02:00 PM COMPARISON: 11/13/2014 CLINICAL HISTORY: CP FINDINGS: LUNGS AND PLEURA: No focal pulmonary opacity. No pleural effusion. No pneumothorax. HEART AND MEDIASTINUM: No acute abnormality of the cardiac and mediastinal silhouettes. BONES AND SOFT TISSUES: No acute osseous abnormality. IMPRESSION: 1. No acute cardiopulmonary process. Electronically signed by: Franky Crease MD 08/29/2024 06:07 PM EST RP Workstation: [id number]    EKG: I independently viewed the EKG done and my findings are as followed: Sinus rhythm rate of 65.  Nonspecific ST-T changes.  QTc 449.  Assessment/Plan Present on Admission:  NSTEMI (non-ST elevated myocardial infarction) (HCC)  Principal Problem:   NSTEMI (non-ST elevated myocardial infarction) (HCC)  NSTEMI Presented with chest pain and elevated troponin with delta, 100, 142 Took 4 doses of 81 mg aspirin  prior to presentation. Continue heparin  drip Daily aspirin , 81 mg daily, high intensity statin, Lipitor 40 mg daily. Follow transthoracic echocardiogram Rest of management per cardiology.  Hypertension, BP is not at goal, elevated Resume home oral antihypertensives Closely monitor vital signs  Type 2 diabetes with hyperglycemia Last hemoglobin A1c 8.8 on 04/06/2024 Start long-acting and short acting insulin  coverage Heart healthy carb modified diet N.p.o. after midnight, until seen by cardiology.  History of prior CVA Resume home regimen  Obesity BMI 31 Recommend weight loss outpatient with regular physical activity and healthy dieting.   Critical care time: 55 minutes.   DVT prophylaxis: Heparin  drip.  Code Status: Full code.  Family Communication: None at bedside.  Disposition Plan: Admitted to progressive care unit.  Consults called: Cardiology consulted by EDP.  Admission status: Inpatient  status.   Status is: Inpatient The patient requires at least 2 midnights for further evaluation and treatment of present condition.   Terry LOISE Hurst MD [facility] Pager [phone]  If 7PM-7AM, please contact night-coverage [email or web address] Password [email or web address]  08/29/2024, 9:41 PM

## 2024-08-29 NOTE — ED Notes (Signed)
 Patient's wife requests phone call when patient is transferred to [facility].

## 2024-08-30 ENCOUNTER — Encounter (HOSPITAL_COMMUNITY): Payer: Self-pay | Admitting: Internal Medicine

## 2024-08-30 ENCOUNTER — Encounter (HOSPITAL_COMMUNITY): Admission: EM | Disposition: A | Payer: Self-pay | Source: Home / Self Care | Attending: Family Medicine

## 2024-08-30 ENCOUNTER — Inpatient Hospital Stay (HOSPITAL_COMMUNITY)

## 2024-08-30 DIAGNOSIS — E118 Type 2 diabetes mellitus with unspecified complications: Secondary | ICD-10-CM

## 2024-08-30 DIAGNOSIS — I10 Essential (primary) hypertension: Secondary | ICD-10-CM | POA: Diagnosis not present

## 2024-08-30 DIAGNOSIS — I7121 Aneurysm of the ascending aorta, without rupture: Secondary | ICD-10-CM

## 2024-08-30 DIAGNOSIS — I214 Non-ST elevation (NSTEMI) myocardial infarction: Secondary | ICD-10-CM | POA: Diagnosis not present

## 2024-08-30 DIAGNOSIS — I2510 Atherosclerotic heart disease of native coronary artery without angina pectoris: Secondary | ICD-10-CM | POA: Diagnosis not present

## 2024-08-30 HISTORY — PX: LEFT HEART CATH AND CORONARY ANGIOGRAPHY: CATH118249

## 2024-08-30 LAB — CBC
HCT: 41.3 % (ref 39.0–52.0)
Hemoglobin: 14.2 g/dL (ref 13.0–17.0)
MCH: 29.2 pg (ref 26.0–34.0)
MCHC: 34.4 g/dL (ref 30.0–36.0)
MCV: 84.8 fL (ref 80.0–100.0)
Platelets: 163 K/uL (ref 150–400)
RBC: 4.87 MIL/uL (ref 4.22–5.81)
RDW: 12.7 % (ref 11.5–15.5)
WBC: 9.2 K/uL (ref 4.0–10.5)
nRBC: 0 % (ref 0.0–0.2)

## 2024-08-30 LAB — MAGNESIUM: Magnesium: 2.1 mg/dL (ref 1.7–2.4)

## 2024-08-30 LAB — PHOSPHORUS: Phosphorus: 2.6 mg/dL (ref 2.5–4.6)

## 2024-08-30 LAB — LIPID PANEL
Cholesterol: 146 mg/dL (ref 0–200)
HDL: 31 mg/dL — ABNORMAL LOW (ref >40)
LDL Cholesterol: 86 mg/dL (ref 0–99)
Total CHOL/HDL Ratio: 4.8 ratio
Triglycerides: 148 mg/dL (ref <150)
VLDL: 30 mg/dL (ref 0–40)

## 2024-08-30 LAB — ECHOCARDIOGRAM COMPLETE
Area-P 1/2: 2.53 cm2
Height: 67 in
S' Lateral: 3 cm
Weight: 3199.32 [oz_av]

## 2024-08-30 LAB — BASIC METABOLIC PANEL WITH GFR
Anion gap: 11 (ref 5–15)
BUN: 14 mg/dL (ref 8–23)
CO2: 23 mmol/L (ref 22–32)
Calcium: 8.5 mg/dL — ABNORMAL LOW (ref 8.9–10.3)
Chloride: 105 mmol/L (ref 98–111)
Creatinine, Ser: 1.05 mg/dL (ref 0.61–1.24)
GFR, Estimated: >60 mL/min (ref >60)
Glucose, Bld: 188 mg/dL — ABNORMAL HIGH (ref 70–99)
Potassium: 3.9 mmol/L (ref 3.5–5.1)
Sodium: 138 mmol/L (ref 135–145)

## 2024-08-30 LAB — GLUCOSE, CAPILLARY
Glucose-Capillary: 120 mg/dL — ABNORMAL HIGH (ref 70–99)
Glucose-Capillary: 213 mg/dL — ABNORMAL HIGH (ref 70–99)

## 2024-08-30 LAB — HEMOGLOBIN A1C
Hgb A1c MFr Bld: 8.4 % — ABNORMAL HIGH (ref 4.8–5.6)
Mean Plasma Glucose: 194 mg/dL

## 2024-08-30 LAB — TROPONIN T, HIGH SENSITIVITY
Troponin T High Sensitivity: 284 ng/L (ref 0–19)
Troponin T High Sensitivity: 303 ng/L (ref 0–19)
Troponin T High Sensitivity: 409 ng/L (ref 0–19)
Troponin T High Sensitivity: 478 ng/L (ref 0–19)
Troponin T High Sensitivity: 444 ng/L (ref 0–19)

## 2024-08-30 LAB — HIV ANTIBODY (ROUTINE TESTING W REFLEX): HIV Screen 4th Generation wRfx: NONREACTIVE

## 2024-08-30 LAB — CBG MONITORING, ED
Glucose-Capillary: 217 mg/dL — ABNORMAL HIGH (ref 70–99)
Glucose-Capillary: 148 mg/dL — ABNORMAL HIGH (ref 70–99)

## 2024-08-30 LAB — HEPARIN LEVEL (UNFRACTIONATED): Heparin Unfractionated: 0.19 [IU]/mL — ABNORMAL LOW (ref 0.30–0.70)

## 2024-08-30 MED ORDER — HYDRALAZINE HCL 20 MG/ML IJ SOLN: 10.0000 mg | INTRAMUSCULAR | Status: DC | PRN

## 2024-08-30 MED ORDER — MIDAZOLAM HCL 2 MG/2ML IJ SOLN
INTRAMUSCULAR | Status: AC
Start: 2024-08-30 — End: 2024-08-30
  Filled 2024-08-30: qty 2

## 2024-08-30 MED ORDER — HEPARIN SODIUM (PORCINE) 1000 UNIT/ML IJ SOLN
INTRAMUSCULAR | Status: DC | PRN
  Administered 2024-08-30: 4500 [IU] via INTRAVENOUS

## 2024-08-30 MED ORDER — FREE WATER: 500.0000 mL | Freq: Once | Status: DC

## 2024-08-30 MED ORDER — LIDOCAINE HCL (PF) 1 % IJ SOLN
INTRAMUSCULAR | Status: AC
Start: 2024-08-30 — End: 2024-08-30
  Filled 2024-08-30: qty 30

## 2024-08-30 MED ORDER — SODIUM CHLORIDE 0.9 % IV SOLN: INTRAVENOUS | Status: DC

## 2024-08-30 MED ORDER — HEPARIN (PORCINE) 25000 UT/250ML-% IV SOLN
1950.0000 [IU]/h | INTRAVENOUS | Status: DC
  Administered 2024-08-30: 1300 [IU]/h via INTRAVENOUS
  Filled 2024-08-30 (×2): qty 250

## 2024-08-30 MED ORDER — FENTANYL CITRATE (PF) 100 MCG/2ML IJ SOLN
INTRAMUSCULAR | Status: DC | PRN
  Administered 2024-08-30: 25 ug via INTRAVENOUS

## 2024-08-30 MED ORDER — HEPARIN SODIUM (PORCINE) 1000 UNIT/ML IJ SOLN
INTRAMUSCULAR | Status: AC
Start: 2024-08-30 — End: 2024-08-30
  Filled 2024-08-30: qty 10

## 2024-08-30 MED ORDER — SODIUM CHLORIDE 0.9 % IV SOLN: 250.0000 mL | INTRAVENOUS | Status: DC | PRN

## 2024-08-30 MED ORDER — VERAPAMIL HCL 2.5 MG/ML IV SOLN
INTRAVENOUS | Status: DC | PRN
  Administered 2024-08-30: 10 mL via INTRA_ARTERIAL

## 2024-08-30 MED ORDER — METOPROLOL TARTRATE 25 MG PO TABS: 12.5000 mg | ORAL_TABLET | Freq: Two times a day (BID) | ORAL | Status: DC

## 2024-08-30 MED ORDER — FREE WATER
500.0000 mL | Freq: Once | Status: AC
  Administered 2024-08-30: 500 mL via ORAL

## 2024-08-30 MED ORDER — NITROGLYCERIN 2 % TD OINT
1.0000 [in_us] | TOPICAL_OINTMENT | Freq: Four times a day (QID) | TRANSDERMAL | Status: DC
  Administered 2024-08-30: 1 [in_us] via TOPICAL
  Filled 2024-08-30: qty 1

## 2024-08-30 MED ORDER — CARVEDILOL 3.125 MG PO TABS
3.1250 mg | ORAL_TABLET | Freq: Two times a day (BID) | ORAL | Status: DC
  Administered 2024-08-30 – 2024-08-31 (×3): 3.125 mg via ORAL
  Filled 2024-08-30 (×4): qty 1

## 2024-08-30 MED ORDER — FENTANYL CITRATE (PF) 100 MCG/2ML IJ SOLN
INTRAMUSCULAR | Status: AC
  Filled 2024-08-30: qty 2

## 2024-08-30 MED ORDER — ISOSORBIDE MONONITRATE ER 30 MG PO TB24
30.0000 mg | ORAL_TABLET | Freq: Every day | ORAL | Status: DC
  Administered 2024-08-30 – 2024-08-31 (×2): 30 mg via ORAL
  Filled 2024-08-30 (×2): qty 1

## 2024-08-30 MED ORDER — VERAPAMIL HCL 2.5 MG/ML IV SOLN
INTRAVENOUS | Status: AC
  Filled 2024-08-30: qty 2

## 2024-08-30 MED ORDER — LABETALOL HCL 5 MG/ML IV SOLN: 10.0000 mg | INTRAVENOUS | Status: DC | PRN

## 2024-08-30 MED ORDER — HEPARIN (PORCINE) IN NACL 1000-0.9 UT/500ML-% IV SOLN
INTRAVENOUS | Status: DC | PRN
  Administered 2024-08-30 (×2): 500 mL

## 2024-08-30 MED ORDER — IOHEXOL 350 MG/ML SOLN
INTRAVENOUS | Status: DC | PRN
  Administered 2024-08-30: 40 mL via INTRA_ARTERIAL

## 2024-08-30 MED ORDER — SODIUM CHLORIDE 0.9% FLUSH
3.0000 mL | Freq: Two times a day (BID) | INTRAVENOUS | Status: DC
  Administered 2024-08-30 – 2024-08-31 (×3): 3 mL via INTRAVENOUS

## 2024-08-30 MED ORDER — MIDAZOLAM HCL (PF) 2 MG/2ML IJ SOLN
INTRAMUSCULAR | Status: DC | PRN
  Administered 2024-08-30: 1 mg via INTRAVENOUS

## 2024-08-30 MED ORDER — HEPARIN BOLUS VIA INFUSION
2500.0000 [IU] | Freq: Once | INTRAVENOUS | Status: AC
  Administered 2024-08-30: 2500 [IU] via INTRAVENOUS

## 2024-08-30 MED ORDER — SODIUM CHLORIDE 0.9% FLUSH: 3.0000 mL | INTRAVENOUS | Status: DC | PRN

## 2024-08-30 MED ORDER — LIDOCAINE HCL (PF) 1 % IJ SOLN
INTRAMUSCULAR | Status: DC | PRN
  Administered 2024-08-30: 2 mL

## 2024-08-30 NOTE — Progress Notes (Signed)
  Echocardiogram 2D Echocardiogram has been performed.  Tinnie FORBES Gosling RDCS 08/30/2024, 11:29 AM

## 2024-08-30 NOTE — ED Notes (Signed)
 Patient's wife reports that patient had a TEE and stress test that were both negative in 2016.

## 2024-08-30 NOTE — Hospital Course (Signed)
 68 y.o. male with medical history significant for hypertension, type 2 diabetes, GERD, prior CVA x 2 (with no motor or sensory deficits), TIA, OSA, who presents to the ER with complaints of intermittent chest pain, since last night.  Centrally located, radiating to left arm.  Initially started when he was loading his car for a trip to the beach.  It became worse last night after they returned home after 4-1/2-hour trip.  Associated with shortness of breath, nausea without vomiting.  He took 4 doses of baby aspirin  81 mg at home, prior to presenting to the ER.   In the ER, blood pressure significantly elevated with SBP in the 190s and DBP's in the 90s.  Lab studies was notable for elevated D-dimer 0.75.  CT angio PE revealed no evidence of pulmonary embolism, mild dilatation of the ascending aorta measuring 4.2 cm with atherosclerotic calcifications.  Recommended annual imaging follow-up by CT angio or MRI.   No evidence of acute ischemia on twelve-lead EKG however, high-sensitivity troponin was elevated 100, repeat 172.  The patient was started on heparin  drip.   EDP discussed the case with cardiology who recommended transfer to [facility] for further management of NSTEMI.  Admitted by [facility], hospitalist service.

## 2024-08-30 NOTE — Progress Notes (Signed)
 PROGRESS NOTE   Bryan Shepard Bryan Shepard.  FMW:[id number] DOB: August 07, 1956 DOA: 08/29/2024 PCP: Severa Rock HERO, FNP   No chief complaint on file.  Level of care: Progressive  Brief Admission History:  69 y.o. male with medical history significant for hypertension, type 2 diabetes, GERD, prior CVA x 2 (with no motor or sensory deficits), TIA, OSA, who presents to the ER with complaints of intermittent chest pain, since last night.  Centrally located, radiating to left arm.  Initially started when he was loading his car for a trip to the beach.  It became worse last night after they returned home after 4-1/2-hour trip.  Associated with shortness of breath, nausea without vomiting.  He took 4 doses of baby aspirin  81 mg at home, prior to presenting to the ER.   In the ER, blood pressure significantly elevated with SBP in the 190s and DBP's in the 90s.  Lab studies was notable for elevated D-dimer 0.75.  CT angio PE revealed no evidence of pulmonary embolism, mild dilatation of the ascending aorta measuring 4.2 cm with atherosclerotic calcifications.  Recommended annual imaging follow-up by CT angio or MRI.   No evidence of acute ischemia on twelve-lead EKG however, high-sensitivity troponin was elevated 100, repeat 172.  The patient was started on heparin  drip.   EDP discussed the case with cardiology who recommended transfer to [facility] for further management of NSTEMI.  Admitted by [facility], hospitalist service.   Assessment and Plan:  NSTEMI Presented with chest pain and elevated troponin with delta, 100>>478 Took 4 doses of 81 mg aspirin  prior to presentation. Continue heparin  drip pending cardiology team evaluation Daily aspirin , 81 mg daily, high intensity statin, Lipitor 40 mg daily. Follow transthoracic echocardiogram   Essential Hypertension Resume home oral antihypertensives Closely monitor vital signs   Uncontrolled Type 2 diabetes with hyperglycemia Last hemoglobin A1c 8.8 on  04/06/2024 Start long-acting and short acting insulin  coverage Heart healthy carb modified diet N.p.o until seen by cardiology, plan to add prandial coverage when diet resumed   Elevated proBNP, POA Admission ProBNP 325 Euvolemic on exam Gentle IV fluid hydration, NS at 50 cc/h x 2 days, post IV contrast Follow transthoracic echocardiogram Monitor strict I's and O's and daily weight.   History of prior CVA x 2 and TIA No residual motor or sensory deficit. Resume home regimen   Obesity BMI 31 Recommend weight loss outpatient with regular physical activity and healthy dieting.   Incidental findings, seen on CT scan: Mild dilatation of the ascending aorta measuring 4.2 cm with atherosclerotic calcifications.   Recommended annual imaging follow-up by CT angio or MRI.    DVT prophylaxis: IV heparin  infusion  Code Status: Full  Family Communication:  Disposition: awaiting bed at [facility]   Consultants:  cardiology Procedures:   Antimicrobials:    Subjective: Pt says he is not currently having chest pain and only minimal back pain currently.  No SOB.  No palpitations.  Objective: Vitals:   08/30/24 0730 08/30/24 0734 08/30/24 0745 08/30/24 0800  BP: (!) 153/91  (!) 147/85 (!) 152/87  Pulse: 66  64 69  Resp: 10  16 15   Temp:  98.6 F (37 C)    TempSrc:  Oral    SpO2: 96%  96% 96%  Weight:      Height:        Intake/Output Summary (Last 24 hours) at 08/30/2024 0837 Last data filed at 08/30/2024 0736 Gross per 24 hour  Intake 142.88 ml  Output --  Net 142.88 ml   Filed Weights   08/29/24 1733  Weight: 90.7 kg   Examination:  General exam: Appears calm and comfortable  Respiratory system: Clear to auscultation. Respiratory effort normal. Cardiovascular system: normal S1 & S2 heard. No JVD, murmurs, rubs, gallops or clicks. No pedal edema. Gastrointestinal system: Abdomen is nondistended, soft and nontender. No organomegaly or masses felt. Normal bowel sounds  heard. Central nervous system: Alert and oriented. No focal neurological deficits. Extremities: Symmetric 5 x 5 power. Skin: No rashes, lesions or ulcers. Psychiatry: Judgement and insight appear normal. Mood & affect appropriate.   Data Reviewed: I have personally reviewed following labs and imaging studies  CBC: Recent Labs  Lab 08/29/24 1813 08/30/24 0535  WBC 7.5 9.2  HGB 14.5 14.2  HCT 42.5 41.3  MCV 85.2 84.8  PLT 183 163    Basic Metabolic Panel: Recent Labs  Lab 08/29/24 1813 08/30/24 0535  NA 137 138  K 4.0 3.9  CL 103 105  CO2 25 23  GLUCOSE 244* 188*  BUN 16 14  CREATININE 1.24 1.05  CALCIUM  8.6* 8.5*  MG  --  2.1  PHOS  --  2.6    CBG: Recent Labs  Lab 08/29/24 2224 08/30/24 0805  GLUCAP 212* 217*    No results found for this or any previous visit (from the past 240 hours).   Radiology Studies: CT Angio Chest PE W and/or Wo Contrast Result Date: 08/29/2024 EXAM: CTA of the Chest with contrast for PE 08/29/2024 07:36:01 PM TECHNIQUE: CTA of the chest was performed after the administration of intravenous contrast. 75 ml omnipaque  350 Multiplanar reformatted images are provided for review. MIP images are provided for review. Automated exposure control, iterative reconstruction, and/or weight based adjustment of the mA/kV was utilized to reduce the radiation dose to as low as reasonably achievable. COMPARISON: Chest x-ray from 08/29/2024. CLINICAL HISTORY: Chest pain. FINDINGS: PULMONARY ARTERIES: No intraluminal filling defect to suggest pulmonary embolism is noted. Main pulmonary artery is normal in caliber. The pulmonary artery shows a normal branching pattern bilaterally. MEDIASTINUM: The heart is not significantly enlarged. Scattered coronary calcifications are noted. The thoracic aorta shows atherosclerotic calcifications. Mild dilatation of the ascending aorta to 4.2 cm is noted. The thoracic inlet is within normal limits. The esophagus, as visualized  is within normal limits. LYMPH NODES: No mediastinal, hilar or axillary lymphadenopathy. LUNGS AND PLEURA: The lungs are well aerated bilaterally. No focal infiltrate or effusion is seen. No pneumothorax. UPPER ABDOMEN: The upper abdomen is unremarkable. Prior cholecystectomy is seen. SOFT TISSUES AND BONES: Degenerative changes of the thoracic spine are seen. No rib abnormality is noted. No acute soft tissue abnormality. IMPRESSION: 1. No evidence of pulmonary embolism. 2. 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 This recommendation follows 2010 ACCF/AHA/AATS/ACR/ASA/SCA/SCAI/SIR/STS/SVM Guidelines for the Diagnosis and Management of Patients with Thoracic Aortic Disease. Circulation. 2010; 121: Z733-z630. Aortic aneurysm NOS (ICD10-I71.9) Electronically signed by: Oneil Devonshire MD 08/29/2024 07:45 PM EST RP Workstation: [id number]   DG Chest Port 1 View Result Date: 08/29/2024 EXAM: 1 VIEW(S) XRAY OF THE CHEST 08/29/2024 06:02:00 PM COMPARISON: 11/13/2014 CLINICAL HISTORY: CP FINDINGS: LUNGS AND PLEURA: No focal pulmonary opacity. No pleural effusion. No pneumothorax. HEART AND MEDIASTINUM: No acute abnormality of the cardiac and mediastinal silhouettes. BONES AND SOFT TISSUES: No acute osseous abnormality. IMPRESSION: 1. No acute cardiopulmonary process. Electronically signed by: Franky Crease MD 08/29/2024 06:07 PM  EST RP Workstation: [id number]    Scheduled Meds:  aspirin  EC  81 mg Oral Daily   atorvastatin   40 mg Oral Daily   insulin  aspart  0-20 Units Subcutaneous TID WC   insulin  aspart  0-5 Units Subcutaneous QHS   lidocaine   2 patch Transdermal Q24H   Continuous Infusions:  sodium chloride  50 mL/hr at 08/30/24 0340   heparin  1,300 Units/hr (08/30/24 0822)     LOS: 1 day   Time spent: 50 mins  Elany Felix Vicci, MD How to contact the [facility] Attending or Consulting provider 7A - 7P or covering  provider during after hours 7P -7A, for this patient?  Check the care team in [facility] and look for a) attending/consulting TRH provider listed and b) the TRH team listed Log into [email or web address] to find provider on call.  Locate the TRH provider you are looking for under [facility] and page to a number that you can be directly reached. If you still have difficulty reaching the provider, please page the [facility] (Director on Call) for the Hospitalists listed on amion for assistance.  08/30/2024, 8:37 AM

## 2024-08-30 NOTE — Interval H&P Note (Signed)
 History and Physical Interval Note:  08/30/2024 4:44 PM  [name].  has presented today for surgery, with the diagnosis of nstemi.  The various methods of treatment have been discussed with the patient and family. After consideration of risks, benefits and other options for treatment, the patient has consented to  Procedure(s): LEFT HEART CATH AND CORONARY ANGIOGRAPHY (N/A) as a surgical intervention.  The patient's history has been reviewed, patient examined, no change in status, stable for surgery.  I have reviewed the patient's chart and labs.  Questions were answered to the patient's satisfaction.    Cath Lab Visit (complete for each Cath Lab visit)  Clinical Evaluation Leading to the Procedure:   ACS: Yes.    Non-ACS:  N/A  [name]

## 2024-08-30 NOTE — H&P (View-Only) (Signed)
 Cardiology Consultation   Patient ID: Bryan Shepard Bryan Shepard. MRN: [id number]; DOB: Jan 31, 1956  Admit date: 08/29/2024 Date of Consult: 08/30/2024  PCP:  Severa Rock HERO, FNP   [facility] Providers Cardiologist:  New to [facility]   Patient Profile: Bryan Shepard. is a 68 y.o. male with a hx of HTN, HLD, Type 2 DM and prior CVA who is being seen 08/30/2024 for the evaluation of NSTEMI at the request of Dr. Vicci.  History of Present Illness: Mr. Effinger was previously followed by [facility] in 08/2015 following his CVA and underwent ETT and TEE at that time which were reassuring.Did undergo placement of an ILR.  It does not appear that he has been evaluated by Cardiology since.  He presented to [name] yesterday evening for evaluation of chest pain. In talking with the patient today, he reports he has been more active over the past several months as he was trying to focus on dietary changes and exercise in an effort to help with his blood pressure and diabetes. He has lost over 20 pounds in doing so. Over the past week, he has experienced increasing episodes of chest pressure at rest and with activity. Reports the pain radiates into his back at night and he initially felt that symptoms were possibly due to sleeping on a different bed while on vacation but pain persisted upon returning home. Chest pain has occurred with exertion as well and was notable yesterday when walking to his mailbox. He denies any specific orthopnea, PND or pitting edema. Reports worsening shortness of breath with activity over the past few weeks. He is unaware of any personal history of CAD, CHF or cardiac arrhythmias. Reports his dad had bypass in his 46's. 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  BP was initially elevated at 192/88 while  in the ED but has improved with SBP currently in the 150's.  Initial labs showed WBC 7.5, Hgb 14.5, platelets 183, Na+ 137, K+ 4.0 and creatinine 1.24 (close to baseline). proBNP at 325. Initial and repeat Hs Troponin values elevated at 100, 172, 284, 303 and 409.  CXR with no acute cardiopulmonary abnormalities. CTA showed no evidence of a PE but was noted to have mild dilatation of the ascending aorta at 4.2 cm with annual imaging recommended.  EKG shows NSR, HR 81 with PAC's and slight ST depression along the lateral leads.   He has been started on IV Heparin  along with ASA 81 mg daily and Atorvastatin  40 mg daily.  Past Medical History:  Diagnosis Date   Acid reflux    Colon polyps    Diabetes mellitus type 2 in nonobese (HCC) 12/13/2020   Family history of genetic mutation for hereditary nonpolyposis colorectal cancer (HNPCC)    Family history of prostate cancer    Family history of stomach cancer    HOH (hard of hearing)    Hypertension    Sleep apnea    CPAP   Stroke (HCC) 2015   TIA (transient ischemic attack) 2016    Past Surgical History:  Procedure Laterality Date   CHOLECYSTECTOMY N/A 04/17/2020   Procedure: LAPAROSCOPIC CHOLECYSTECTOMY;  Surgeon: Mavis Anes, MD;  Location: [facility] ORS;  Service: General;  Laterality: N/A;   COLONOSCOPY  2018   COLONOSCOPY WITH PROPOFOL  N/A 12/07/2021  Surgeon: Cindie Dunnings K, DO;   nonbleeding internal hemorrhoids, 8 mm polyp (tubular adenoma) in the descending colon removed.  Recommended 5-year surveillance.   POLYPECTOMY  12/07/2021   Procedure: POLYPECTOMY;  Surgeon: Cindie Dunnings POUR, DO;  Location: [facility] ENDO SUITE;  Service: Endoscopy;;     Home Medications:  Prior to Admission medications   Medication Sig Start Date End Date Taking? Authorizing Provider  Accu-Chek FastClix Lancets MISC CHECK BS DAILY DX E11.9 01/07/24   Severa Rock HERO, FNP  aspirin  81 MG chewable tablet Chew 81 mg by mouth daily.    [provider]  Blood  Glucose Monitoring Suppl (ACCU-CHEK GUIDE ME) w/Device KIT CHECK BS DAILY DX E11.9 07/07/23   Rakes, Rock HERO, FNP  Continuous Glucose Sensor (DEXCOM G7 SENSOR) MISC 1 Device by Does not apply route every 14 (fourteen) days. 05/05/24   Severa Rock HERO, FNP  enalapril  (VASOTEC ) 2.5 MG tablet Take 1 tablet (2.5 mg total) by mouth daily. 04/06/24   Severa Rock HERO, FNP  glucose blood (FREESTYLE LITE) test strip Check BS daily Dx E11.9 01/07/24   Severa Rock HERO, FNP    Scheduled Meds:  aspirin  EC  81 mg Oral Daily   atorvastatin   40 mg Oral Daily   insulin  aspart  0-20 Units Subcutaneous TID WC   insulin  aspart  0-5 Units Subcutaneous QHS   lidocaine   2 patch Transdermal Q24H   Continuous Infusions:  sodium chloride  50 mL/hr at 08/30/24 0340   heparin  1,050 Units/hr (08/30/24 0736)   PRN Meds: acetaminophen , melatonin, morphine  injection, oxyCODONE , polyethylene glycol, prochlorperazine   Allergies:   No Known Allergies  Social History:   Social History   Socioeconomic History   Marital status: Married    Spouse name: Virginia    Number of children: Not on file   Years of education: Not on file   Highest education level: Bachelor's degree (e.g., BA, AB, BS)  Occupational History   Occupation: retired     Comment: [employer]   Tobacco Use   Smoking status: Never   Smokeless tobacco: Never  Vaping Use   Vaping status: Never Used  Substance and Sexual Activity   Alcohol use: No   Drug use: No   Sexual activity: Yes    Birth control/protection: None  Other Topics Concern   Not on file  Social History Narrative   Married , 1 step daughter     Family History:    Family History  Problem Relation Age of Onset   Melanoma Mother 53   Hypertension Mother    Transient ischemic attack Mother    Stroke Mother    Heart disease Mother    Prostate cancer Father 68   Stomach cancer Father 57       PMS2+; Lynch syndrome   Melanoma Father    Diabetes Father    Heart disease Father    Stroke  Maternal Grandmother    Lung cancer Paternal Grandmother    Throat cancer Paternal Grandmother    Prostate cancer Paternal Grandfather 29   Bladder Cancer Other        MGM's mother with urethra cancer     ROS:  Please see the history of present illness.   All other ROS reviewed and negative.     Physical Exam/Data: Vitals:   08/30/24 0730 08/30/24 0734 08/30/24 0745 08/30/24 0800  BP: (!) 153/91  (!) 147/85 (!) 152/87  Pulse: 66  64 69  Resp: 10  16 15   Temp:  98.6 F (37 C)    TempSrc:  Oral    SpO2: 96%  96% 96%  Weight:      Height:        Intake/Output Summary (Last 24 hours) at 08/30/2024 0813 Last data filed at 08/30/2024 0736 Gross per 24 hour  Intake 142.88 ml  Output --  Net 142.88 ml      08/29/2024    5:33 PM 07/08/2024    1:14 PM 05/05/2024    2:06 PM  Last 3 Weights  Weight (lbs) 199 lb 15.3 oz 200 lb 201 lb 6.4 oz  Weight (kg) 90.7 kg 90.719 kg 91.354 kg     Body mass index is 31.32 kg/m.  General:  Well nourished, well developed male appearing in no acute distress. HEENT: normal Neck: no JVD Vascular: No carotid bruits; Distal pulses 2+ bilaterally Cardiac:  normal S1, S2; RRR; no murmur  Lungs:  clear to auscultation bilaterally, no wheezing, rhonchi or rales  Abd: soft, nontender, no hepatomegaly  Ext: trace ankle edema bilaterally Musculoskeletal:  No deformities, BUE and BLE strength normal and equal Skin: warm and dry  Neuro:  CNs 2-12 intact, no focal abnormalities noted Psych:  Normal affect   EKG:  The EKG was personally reviewed and demonstrates:  NSR, HR 81 with PAC's and slight ST depression along the lateral leads.   Telemetry:  Telemetry was personally reviewed and demonstrates:  NSR, HR in 70's with occasional PVC's.   Relevant CV Studies:  ETT: 2016 Summary:   Max Exercise:  9:00, 3:00  Stage III  Bruce   10   METS  Max HR:         141> 85 % PMR(139)  Max BP:  167/75  Max ST change:  none  Reason for Termination:  dyspnea   Impression: good exercise tolerance, no ischemia, low probability of obstructive coronary artery disease   Laboratory Data: High Sensitivity Troponin:  No results for input(s): TROPONINIHS in the last 720 hours.   Chemistry Recent Labs  Lab 08/29/24 1813 08/30/24 0535  NA 137 138  K 4.0 3.9  CL 103 105  CO2 25 23  GLUCOSE 244* 188*  BUN 16 14  CREATININE 1.24 1.05  CALCIUM  8.6* 8.5*  MG  --  2.1  GFRNONAA >60 >60  ANIONGAP 9 11    No results for input(s): PROT, ALBUMIN , AST, ALT, ALKPHOS, BILITOT in the last 168 hours. Lipids No results for input(s): CHOL, TRIG, HDL, LABVLDL, LDLCALC, CHOLHDL in the last 168 hours.  Hematology Recent Labs  Lab 08/29/24 1813 08/30/24 0535  WBC 7.5 9.2  RBC 4.99 4.87  HGB 14.5 14.2  HCT 42.5 41.3  MCV 85.2 84.8  MCH 29.1 29.2  MCHC 34.1 34.4  RDW 13.0 12.7  PLT 183 163   Thyroid  No results for input(s): TSH, FREET4 in the last 168 hours.  BNP Recent Labs  Lab 08/29/24 1813  PROBNP 325.0*    DDimer  Recent Labs  Lab 08/29/24 1812  DDIMER 0.75*    Radiology/Studies:  CT Angio Chest PE W and/or Wo Contrast Result Date: 08/29/2024 EXAM: CTA of the Chest with contrast for PE 08/29/2024 07:36:01 PM TECHNIQUE: CTA of the chest was performed after the administration of intravenous contrast. 75 ml omnipaque  350 Multiplanar reformatted images are provided for review. MIP images are provided for review. Automated exposure control, iterative reconstruction, and/or weight based adjustment of the mA/kV was utilized to reduce the radiation dose to as low as  reasonably achievable. COMPARISON: Chest x-ray from 08/29/2024. CLINICAL HISTORY: Chest pain. FINDINGS: PULMONARY ARTERIES: No intraluminal filling defect to suggest pulmonary embolism is noted. Main pulmonary artery is normal in caliber. The pulmonary artery shows a normal branching pattern bilaterally. MEDIASTINUM: The heart is not significantly  enlarged. Scattered coronary calcifications are noted. The thoracic aorta shows atherosclerotic calcifications. Mild dilatation of the ascending aorta to 4.2 cm is noted. The thoracic inlet is within normal limits. The esophagus, as visualized is within normal limits. LYMPH NODES: No mediastinal, hilar or axillary lymphadenopathy. LUNGS AND PLEURA: The lungs are well aerated bilaterally. No focal infiltrate or effusion is seen. No pneumothorax. UPPER ABDOMEN: The upper abdomen is unremarkable. Prior cholecystectomy is seen. SOFT TISSUES AND BONES: Degenerative changes of the thoracic spine are seen. No rib abnormality is noted. No acute soft tissue abnormality. IMPRESSION: 1. No evidence of pulmonary embolism. 2. 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 This recommendation follows 2010 ACCF/AHA/AATS/ACR/ASA/SCA/SCAI/SIR/STS/SVM Guidelines for the Diagnosis and Management of Patients with Thoracic Aortic Disease. Circulation. 2010; 121: Z733-z630. Aortic aneurysm NOS (ICD10-I71.9) Electronically signed by: Oneil Devonshire MD 08/29/2024 07:45 PM EST RP Workstation: [id number]   DG Chest Port 1 View Result Date: 08/29/2024 EXAM: 1 VIEW(S) XRAY OF THE CHEST 08/29/2024 06:02:00 PM COMPARISON: 11/13/2014 CLINICAL HISTORY: CP FINDINGS: LUNGS AND PLEURA: No focal pulmonary opacity. No pleural effusion. No pneumothorax. HEART AND MEDIASTINUM: No acute abnormality of the cardiac and mediastinal silhouettes. BONES AND SOFT TISSUES: No acute osseous abnormality. IMPRESSION: 1. No acute cardiopulmonary process. Electronically signed by: Franky Crease MD 08/29/2024 06:07 PM EST RP Workstation: [id number]     Assessment and Plan:  1. NSTEMI - Presents with progressive chest pain and dyspnea on exertion over the past week which he initially thought was due to not sleeping well at night but symptoms have persisted. EKG shows  slight ST depression along the lateral leads and troponin values have been elevated, trending up to 478 on most recent check. Echocardiogram is pending. Will add-on FLP and Hgb A1c to previously drawn AM labs.  - Given the patient's presenting symptoms, elevated enzymes and multiple cardiac risk factors (HTN, HLD, Type 2 DM and family history of CAD), would recommend a cardiac catheterization for definitive evaluation. This was reviewed with the patient including risk and benefits and he is in agreement to proceed. - Continue IV Heparin  along with ASA 81 mg daily and Atorvastatin  40 mg daily. Will add Lopressor  12.5 mg twice daily.   Informed Consent   Shared Decision Making/Informed Consent{  The risks [stroke (1 in 1000), death (1 in 1000), kidney failure [usually temporary] (1 in 500), bleeding (1 in 200), allergic reaction [possibly serious] (1 in 200)], benefits (diagnostic support and management of coronary artery disease) and alternatives of a cardiac catheterization were discussed in detail with Mr. Kingsley and he is willing to proceed.     2. HTN - BP has been elevated since arrival but gradually improving. Starting Lopressor  12.5 mg twice daily as discussed above. Consider the addition of an ARB prior to discharge given his concurrent Type 2 DM as well.   3. HLD - FLP in 04/2024 showed total cholesterol 160, triglycerides 193 and LDL 99. Will add on a repeat FLP to previously drawn AM labs. He has been started on Atorvastatin  40 mg daily and will need a repeat FLP and LFTs in 6 to 8 weeks as an outpatient.  4. Type  2 DM - Hgb A1c was at 8.8 when checked in 04/2024. He has been focusing on dietary changes and exercise as he did not wish to go on medical therapy and has lost over 20 pounds since. Will add on a repeat Hgb A1c. If Hgb A1c above 7, would recommend initiation of medical therapy given his NSTEMI.  5. Ascending Aortic Aneurysm - Measured 4.2 cm by CT this admission with annual  imaging recommended. Will add anti-hypertensive therapy as discussed above.   6. Dupuytren's Contracture - Being followed at [facility] for this and he reports possibly undergoing surgery in 09/2024. We reviewed that if he requires stent placement, any elective surgical procedures would need to be delayed for a minimum of 6 months.  Risk Assessment/Risk Scores:  TIMI Risk Score for Unstable Angina or Non-ST Elevation MI:   The patient's TIMI risk score is 4, which indicates a 20% risk of all cause mortality, new or recurrent myocardial infarction or need for urgent revascularization in the next 14 days.{  For questions or updates, please contact [facility] Please consult [email or web address] for contact info under    Signed, Laymon CHRISTELLA Qua, PA-C  08/30/2024 8:13 AM

## 2024-08-30 NOTE — Consult Note (Signed)
 Cardiology Consultation   Patient ID: Bryan Shepard. MRN: [id number]; DOB: Jan 31, 1956  Admit date: 08/29/2024 Date of Consult: 08/30/2024  PCP:  Severa Rock HERO, FNP   [facility] Providers Cardiologist:  New [facility]   Patient Profile: Bryan Comella. is a 68 y.o. male with a hx of HTN, HLD, Type 2 DM and prior CVA who is being seen 08/30/2024 for the evaluation of NSTEMI at the request of Dr. Vicci.  History of Present Illness: Bryan Shepard was previously followed by [facility] in 08/2015 following his CVA and underwent ETT and TEE at that time which were reassuring.Did undergo placement of an ILR.  It does not appear that he has been evaluated by Cardiology since.  He presented to [name] yesterday evening for evaluation of chest pain. In talking with the patient today, he reports he has been more active over the past several months as he was trying to focus on dietary changes and exercise in an effort to help with his blood pressure and diabetes. He has lost over 20 pounds in doing so. Over the past week, he has experienced increasing episodes of chest pressure at rest and with activity. Reports the pain radiates into his back at night and he initially felt that symptoms were possibly due to sleeping on a different bed while on vacation but pain persisted upon returning home. Chest pain has occurred with exertion as well and was notable yesterday when walking to his mailbox. He denies any specific orthopnea, PND or pitting edema. Reports worsening shortness of breath with activity over the past few weeks. He is unaware of any personal history of CAD, CHF or cardiac arrhythmias. Reports his dad had bypass in his 46's. 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  BP was initially elevated at 192/88 while  in the ED but has improved with SBP currently in the 150's.  Initial labs showed WBC 7.5, Hgb 14.5, platelets 183, Na+ 137, K+ 4.0 and creatinine 1.24 (close to baseline). proBNP at 325. Initial and repeat Hs Troponin values elevated at 100, 172, 284, 303 and 409.  CXR with no acute cardiopulmonary abnormalities. CTA showed no evidence of a PE but was noted to have mild dilatation of the ascending aorta at 4.2 cm with annual imaging recommended.  EKG shows NSR, HR 81 with PAC's and slight ST depression along the lateral leads.   He has been started on IV Heparin  along with ASA 81 mg daily and Atorvastatin  40 mg daily.  Past Medical History:  Diagnosis Date   Acid reflux    Colon polyps    Diabetes mellitus type 2 in nonobese (HCC) 12/13/2020   Family history of genetic mutation for hereditary nonpolyposis colorectal cancer (HNPCC)    Family history of prostate cancer    Family history of stomach cancer    HOH (hard of hearing)    Hypertension    Sleep apnea    CPAP   Stroke (HCC) 2015   TIA (transient ischemic attack) 2016    Past Surgical History:  Procedure Laterality Date   CHOLECYSTECTOMY N/A 04/17/2020   Procedure: LAPAROSCOPIC CHOLECYSTECTOMY;  Surgeon: Mavis Anes, MD;  Location: [facility] ORS;  Service: General;  Laterality: N/A;   COLONOSCOPY  2018   COLONOSCOPY WITH PROPOFOL  N/A 12/07/2021  Surgeon: Cindie Dunnings K, DO;   nonbleeding internal hemorrhoids, 8 mm polyp (tubular adenoma) in the descending colon removed.  Recommended 5-year surveillance.   POLYPECTOMY  12/07/2021   Procedure: POLYPECTOMY;  Surgeon: Cindie Dunnings POUR, DO;  Location: [facility] ENDO SUITE;  Service: Endoscopy;;     Home Medications:  Prior to Admission medications   Medication Sig Start Date End Date Taking? Authorizing Provider  Accu-Chek FastClix Lancets MISC CHECK BS DAILY DX E11.9 01/07/24   Severa Rock HERO, FNP  aspirin  81 MG chewable tablet Chew 81 mg by mouth daily.    [provider]  Blood  Glucose Monitoring Suppl (ACCU-CHEK GUIDE ME) w/Device KIT CHECK BS DAILY DX E11.9 07/07/23   Rakes, Rock HERO, FNP  Continuous Glucose Sensor (DEXCOM G7 SENSOR) MISC 1 Device by Does not apply route every 14 (fourteen) days. 05/05/24   Severa Rock HERO, FNP  enalapril  (VASOTEC ) 2.5 MG tablet Take 1 tablet (2.5 mg total) by mouth daily. 04/06/24   Severa Rock HERO, FNP  glucose blood (FREESTYLE LITE) test strip Check BS daily Dx E11.9 01/07/24   Severa Rock HERO, FNP    Scheduled Meds:  aspirin  EC  81 mg Oral Daily   atorvastatin   40 mg Oral Daily   insulin  aspart  0-20 Units Subcutaneous TID WC   insulin  aspart  0-5 Units Subcutaneous QHS   lidocaine   2 patch Transdermal Q24H   Continuous Infusions:  sodium chloride  50 mL/hr at 08/30/24 0340   heparin  1,050 Units/hr (08/30/24 0736)   PRN Meds: acetaminophen , melatonin, morphine  injection, oxyCODONE , polyethylene glycol, prochlorperazine   Allergies:   No Known Allergies  Social History:   Social History   Socioeconomic History   Marital status: Married    Spouse name: Virginia    Number of children: Not on file   Years of education: Not on file   Highest education level: Bachelor's degree (e.g., BA, AB, BS)  Occupational History   Occupation: retired     Comment: [employer]   Tobacco Use   Smoking status: Never   Smokeless tobacco: Never  Vaping Use   Vaping status: Never Used  Substance and Sexual Activity   Alcohol use: No   Drug use: No   Sexual activity: Yes    Birth control/protection: None  Other Topics Concern   Not on file  Social History Narrative   Married , 1 step daughter     Family History:    Family History  Problem Relation Age of Onset   Melanoma Mother 53   Hypertension Mother    Transient ischemic attack Mother    Stroke Mother    Heart disease Mother    Prostate cancer Father 68   Stomach cancer Father 57       PMS2+; Lynch syndrome   Melanoma Father    Diabetes Father    Heart disease Father    Stroke  Maternal Grandmother    Lung cancer Paternal Grandmother    Throat cancer Paternal Grandmother    Prostate cancer Paternal Grandfather 29   Bladder Cancer Other        MGM's mother with urethra cancer     ROS:  Please see the history of present illness.   All other ROS reviewed and negative.     Physical Exam/Data: Vitals:   08/30/24 0730 08/30/24 0734 08/30/24 0745 08/30/24 0800  BP: (!) 153/91  (!) 147/85 (!) 152/87  Pulse: 66  64 69  Resp: 10  16 15   Temp:  98.6 F (37 C)    TempSrc:  Oral    SpO2: 96%  96% 96%  Weight:      Height:        Intake/Output Summary (Last 24 hours) at 08/30/2024 0813 Last data filed at 08/30/2024 0736 Gross per 24 hour  Intake 142.88 ml  Output --  Net 142.88 ml      08/29/2024    5:33 PM 07/08/2024    1:14 PM 05/05/2024    2:06 PM  Last 3 Weights  Weight (lbs) 199 lb 15.3 oz 200 lb 201 lb 6.4 oz  Weight (kg) 90.7 kg 90.719 kg 91.354 kg     Body mass index is 31.32 kg/m.  General:  Well nourished, well developed male appearing in no acute distress. HEENT: normal Neck: no JVD Vascular: No carotid bruits; Distal pulses 2+ bilaterally Cardiac:  normal S1, S2; RRR; no murmur  Lungs:  clear to auscultation bilaterally, no wheezing, rhonchi or rales  Abd: soft, nontender, no hepatomegaly  Ext: trace ankle edema bilaterally Musculoskeletal:  No deformities, BUE and BLE strength normal and equal Skin: warm and dry  Neuro:  CNs 2-12 intact, no focal abnormalities noted Psych:  Normal affect   EKG:  The EKG was personally reviewed and demonstrates:  NSR, HR 81 with PAC's and slight ST depression along the lateral leads.   Telemetry:  Telemetry was personally reviewed and demonstrates:  NSR, HR in 70's with occasional PVC's.   Relevant CV Studies:  ETT: 2016 Summary:   Max Exercise:  9:00, 3:00  Stage III  Bruce   10   METS  Max HR:         141> 85 % PMR(139)  Max BP:  167/75  Max ST change:  none  Reason for Termination:  dyspnea   Impression: good exercise tolerance, no ischemia, low probability of obstructive coronary artery disease   Laboratory Data: High Sensitivity Troponin:  No results for input(s): TROPONINIHS in the last 720 hours.   Chemistry Recent Labs  Lab 08/29/24 1813 08/30/24 0535  NA 137 138  K 4.0 3.9  CL 103 105  CO2 25 23  GLUCOSE 244* 188*  BUN 16 14  CREATININE 1.24 1.05  CALCIUM  8.6* 8.5*  MG  --  2.1  GFRNONAA >60 >60  ANIONGAP 9 11    No results for input(s): PROT, ALBUMIN , AST, ALT, ALKPHOS, BILITOT in the last 168 hours. Lipids No results for input(s): CHOL, TRIG, HDL, LABVLDL, LDLCALC, CHOLHDL in the last 168 hours.  Hematology Recent Labs  Lab 08/29/24 1813 08/30/24 0535  WBC 7.5 9.2  RBC 4.99 4.87  HGB 14.5 14.2  HCT 42.5 41.3  MCV 85.2 84.8  MCH 29.1 29.2  MCHC 34.1 34.4  RDW 13.0 12.7  PLT 183 163   Thyroid  No results for input(s): TSH, FREET4 in the last 168 hours.  BNP Recent Labs  Lab 08/29/24 1813  PROBNP 325.0*    DDimer  Recent Labs  Lab 08/29/24 1812  DDIMER 0.75*    Radiology/Studies:  CT Angio Chest PE W and/or Wo Contrast Result Date: 08/29/2024 EXAM: CTA of the Chest with contrast for PE 08/29/2024 07:36:01 PM TECHNIQUE: CTA of the chest was performed after the administration of intravenous contrast. 75 ml omnipaque  350 Multiplanar reformatted images are provided for review. MIP images are provided for review. Automated exposure control, iterative reconstruction, and/or weight based adjustment of the mA/kV was utilized to reduce the radiation dose to as low as  reasonably achievable. COMPARISON: Chest x-ray from 08/29/2024. CLINICAL HISTORY: Chest pain. FINDINGS: PULMONARY ARTERIES: No intraluminal filling defect to suggest pulmonary embolism is noted. Main pulmonary artery is normal in caliber. The pulmonary artery shows a normal branching pattern bilaterally. MEDIASTINUM: The heart is not significantly  enlarged. Scattered coronary calcifications are noted. The thoracic aorta shows atherosclerotic calcifications. Mild dilatation of the ascending aorta to 4.2 cm is noted. The thoracic inlet is within normal limits. The esophagus, as visualized is within normal limits. LYMPH NODES: No mediastinal, hilar or axillary lymphadenopathy. LUNGS AND PLEURA: The lungs are well aerated bilaterally. No focal infiltrate or effusion is seen. No pneumothorax. UPPER ABDOMEN: The upper abdomen is unremarkable. Prior cholecystectomy is seen. SOFT TISSUES AND BONES: Degenerative changes of the thoracic spine are seen. No rib abnormality is noted. No acute soft tissue abnormality. IMPRESSION: 1. No evidence of pulmonary embolism. 2. 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 This recommendation follows 2010 ACCF/AHA/AATS/ACR/ASA/SCA/SCAI/SIR/STS/SVM Guidelines for the Diagnosis and Management of Patients with Thoracic Aortic Disease. Circulation. 2010; 121: Z733-z630. Aortic aneurysm NOS (ICD10-I71.9) Electronically signed by: Oneil Devonshire MD 08/29/2024 07:45 PM EST RP Workstation: [id number]   DG Chest Port 1 View Result Date: 08/29/2024 EXAM: 1 VIEW(S) XRAY OF THE CHEST 08/29/2024 06:02:00 PM COMPARISON: 11/13/2014 CLINICAL HISTORY: CP FINDINGS: LUNGS AND PLEURA: No focal pulmonary opacity. No pleural effusion. No pneumothorax. HEART AND MEDIASTINUM: No acute abnormality of the cardiac and mediastinal silhouettes. BONES AND SOFT TISSUES: No acute osseous abnormality. IMPRESSION: 1. No acute cardiopulmonary process. Electronically signed by: Franky Crease MD 08/29/2024 06:07 PM EST RP Workstation: [id number]     Assessment and Plan:  1. NSTEMI - Presents with progressive chest pain and dyspnea on exertion over the past week which he initially thought was due to not sleeping well at night but symptoms have persisted. EKG shows  slight ST depression along the lateral leads and troponin values have been elevated, trending up to 478 on most recent check. Echocardiogram is pending. Will add-on FLP and Hgb A1c to previously drawn AM labs.  - Given the patient's presenting symptoms, elevated enzymes and multiple cardiac risk factors (HTN, HLD, Type 2 DM and family history of CAD), would recommend a cardiac catheterization for definitive evaluation. This was reviewed with the patient including risk and benefits and he is in agreement to proceed. - Continue IV Heparin  along with ASA 81 mg daily and Atorvastatin  40 mg daily. Will add Lopressor  12.5 mg twice daily.   Informed Consent   Shared Decision Making/Informed Consent{  The risks [stroke (1 in 1000), death (1 in 1000), kidney failure [usually temporary] (1 in 500), bleeding (1 in 200), allergic reaction [possibly serious] (1 in 200)], benefits (diagnostic support and management of coronary artery disease) and alternatives of a cardiac catheterization were discussed in detail with Bryan Shepard and he is willing to proceed.     2. HTN - BP has been elevated since arrival but gradually improving. Starting Lopressor  12.5 mg twice daily as discussed above. Consider the addition of an ARB prior to discharge given his concurrent Type 2 DM as well.   3. HLD - FLP in 04/2024 showed total cholesterol 160, triglycerides 193 and LDL 99. Will add on a repeat FLP to previously drawn AM labs. He has been started on Atorvastatin  40 mg daily and will need a repeat FLP and LFTs in 6 to 8 weeks as an outpatient.  4. Type  2 DM - Hgb A1c was at 8.8 when checked in 04/2024. He has been focusing on dietary changes and exercise as he did not wish to go on medical therapy and has lost over 20 pounds since. Will add on a repeat Hgb A1c. If Hgb A1c above 7, would recommend initiation of medical therapy given his NSTEMI.  5. Ascending Aortic Aneurysm - Measured 4.2 cm by CT this admission with annual  imaging recommended. Will add anti-hypertensive therapy as discussed above.   6. Dupuytren's Contracture - Being followed at [facility] for this and he reports possibly undergoing surgery in 09/2024. We reviewed that if he requires stent placement, any elective surgical procedures would need to be delayed for a minimum of 6 months.  Risk Assessment/Risk Scores:  TIMI Risk Score for Unstable Angina or Non-ST Elevation MI:   The patient's TIMI risk score is 4, which indicates a 20% risk of all cause mortality, new or recurrent myocardial infarction or need for urgent revascularization in the next 14 days.{  For questions or updates, please contact [facility] Please consult [email or web address] for contact info under    Signed, Laymon CHRISTELLA Qua, PA-C  08/30/2024 8:13 AM

## 2024-08-30 NOTE — ED Notes (Signed)
 Patient ambulated to and from the restroom with standby assistance.

## 2024-08-30 NOTE — Progress Notes (Signed)
 Transition of Care Department [facility]) has reviewed patient and no other TOC needs have been identified at this time. We will continue to monitor patient advancement through interdisciplinary progression rounds. If new patient transition needs arise, please place a TOC consult.   08/30/24 1400  TOC Brief Assessment  Insurance and Status Reviewed  Patient has primary care physician Yes  Home environment has been reviewed Lives with wife.  Prior level of function: Independent.  Prior/Current Home Services No current home services  Social Drivers of Health Review SDOH reviewed no interventions necessary  Readmission risk has been reviewed Yes  Transition of care needs no transition of care needs at this time

## 2024-08-30 NOTE — Progress Notes (Signed)
 PHARMACY - ANTICOAGULATION CONSULT NOTE  Pharmacy Consult for Heparin  Infusion Indication: chest pain/ACS  No Known Allergies  Patient Measurements: Height: 5' 7 (170.2 cm) Weight: 90.7 kg (199 lb 15.3 oz) IBW/kg (Calculated) : 66.1 HEPARIN  DW (KG): 85  Vital Signs: Temp: 98.3 F (36.8 C) (11/24 1900) Temp Source: Oral (11/24 1900) BP: 134/78 (11/24 1900) Pulse Rate: 71 (11/24 1900)  Labs: Recent Labs    08/29/24 1813 08/30/24 0535  HGB 14.5 14.2  HCT 42.5 41.3  PLT 183 163  APTT 30  --   LABPROT 13.4  --   INR 1.0  --   HEPARINUNFRC  --  0.19*  CREATININE 1.24 1.05    Medications:  Not on anticoagulation at home per chart review  Assessment: Patient is a 68 year old male with a past medical history of diabetes, HTN, and TIA/CVA who presented with intermittent chest pain since last night. Also reports shortness of breath and dizziness. Troponin level elevated at 100>172. Pharmacy was consulted to start this patient on a heparin  infusion for ACS/STEMI.  Pt underwent LHC 08/30/24 which showed severe 3-vessel CAD. Pt is to undergo inpatient eval for CABG. Pharmacy consulted to restart heparin  gtt 2 hours after TR band has been removed. TR band removed 1951   Pt subtherapeutic previously on 1050u/hr.    Goal of Therapy:  Heparin  level 0.3-0.7 units/ml Monitor platelets by anticoagulation protocol: Yes   Plan:  Start heparin  gtt at 2200 Start Heparin  infusion at 1300 units/hr  Check heparin  level in 6 hours and daily while on heparin  Monitor CBC daily while on heparin  F/u CABG/surgical plans  Sharyne Glatter, PharmD, BCCCP Critical Care Clinical Pharmacist 08/30/2024

## 2024-08-30 NOTE — Progress Notes (Signed)
 PHARMACY - ANTICOAGULATION CONSULT NOTE  Pharmacy Consult for Heparin  Infusion Indication: chest pain/ACS  No Known Allergies  Patient Measurements: Height: 5' 7 (170.2 cm) Weight: 90.7 kg (199 lb 15.3 oz) IBW/kg (Calculated) : 66.1 HEPARIN  DW (KG): 85  Vital Signs: Temp: 98.6 F (37 C) (11/24 0734) Temp Source: Oral (11/24 0734) BP: 152/87 (11/24 0800) Pulse Rate: 69 (11/24 0800)  Labs: Recent Labs    08/29/24 1813 08/30/24 0535  HGB 14.5 14.2  HCT 42.5 41.3  PLT 183 163  APTT 30  --   LABPROT 13.4  --   INR 1.0  --   HEPARINUNFRC  --  0.19*  CREATININE 1.24 1.05    Estimated Creatinine Clearance: 73.3 mL/min (by C-G formula based on SCr of 1.05 mg/dL).   Medical History: Past Medical History:  Diagnosis Date   Acid reflux    Colon polyps    Diabetes mellitus type 2 in nonobese (HCC) 12/13/2020   Family history of genetic mutation for hereditary nonpolyposis colorectal cancer (HNPCC)    Family history of prostate cancer    Family history of stomach cancer    HOH (hard of hearing)    Hypertension    Sleep apnea    CPAP   Stroke (HCC) 2015   TIA (transient ischemic attack) 2016    Medications:  Not on anticoagulation at home per chart review  Assessment: Patient is a 68 year old male with a past medical history of diabetes, HTN, and TIA/CVA who presented with intermittent chest pain since last night. Also reports shortness of breath and dizziness. Troponin level elevated at 100>172. Pharmacy was consulted to start this patient on a heparin  infusion for ACS/STEMI.  HL 0.19, subtherapeutic.No issues with infusion or bleeding per RN  Goal of Therapy:  Heparin  level 0.3-0.7 units/ml Monitor platelets by anticoagulation protocol: Yes   Plan:  Give 2500 unit bolus x 1 Increase heparin  infusion to 1300 units/hr Check heparin  level in 6 hours and daily while on heparin  Monitor CBC daily while on heparin   Cherlyn Boers, BS Pharm D, BCPS Clinical  Pharmacist 08/30/2024,8:08 AM

## 2024-08-31 ENCOUNTER — Inpatient Hospital Stay (HOSPITAL_COMMUNITY)

## 2024-08-31 ENCOUNTER — Other Ambulatory Visit (HOSPITAL_COMMUNITY): Payer: Self-pay

## 2024-08-31 ENCOUNTER — Telehealth (HOSPITAL_COMMUNITY): Payer: Self-pay

## 2024-08-31 DIAGNOSIS — I517 Cardiomegaly: Secondary | ICD-10-CM

## 2024-08-31 DIAGNOSIS — I2510 Atherosclerotic heart disease of native coronary artery without angina pectoris: Secondary | ICD-10-CM

## 2024-08-31 DIAGNOSIS — I25110 Atherosclerotic heart disease of native coronary artery with unstable angina pectoris: Secondary | ICD-10-CM

## 2024-08-31 DIAGNOSIS — I10 Essential (primary) hypertension: Secondary | ICD-10-CM | POA: Diagnosis not present

## 2024-08-31 DIAGNOSIS — I129 Hypertensive chronic kidney disease with stage 1 through stage 4 chronic kidney disease, or unspecified chronic kidney disease: Secondary | ICD-10-CM

## 2024-08-31 DIAGNOSIS — Z8673 Personal history of transient ischemic attack (TIA), and cerebral infarction without residual deficits: Secondary | ICD-10-CM

## 2024-08-31 DIAGNOSIS — E1159 Type 2 diabetes mellitus with other circulatory complications: Secondary | ICD-10-CM

## 2024-08-31 DIAGNOSIS — N183 Chronic kidney disease, stage 3 unspecified: Secondary | ICD-10-CM

## 2024-08-31 DIAGNOSIS — Z8249 Family history of ischemic heart disease and other diseases of the circulatory system: Secondary | ICD-10-CM

## 2024-08-31 DIAGNOSIS — I214 Non-ST elevation (NSTEMI) myocardial infarction: Secondary | ICD-10-CM | POA: Diagnosis not present

## 2024-08-31 DIAGNOSIS — E785 Hyperlipidemia, unspecified: Secondary | ICD-10-CM

## 2024-08-31 DIAGNOSIS — I77810 Thoracic aortic ectasia: Secondary | ICD-10-CM

## 2024-08-31 LAB — BASIC METABOLIC PANEL WITH GFR
Anion gap: 10 (ref 5–15)
BUN: 16 mg/dL (ref 8–23)
CO2: 22 mmol/L (ref 22–32)
Calcium: 8.6 mg/dL — ABNORMAL LOW (ref 8.9–10.3)
Chloride: 105 mmol/L (ref 98–111)
Creatinine, Ser: 1.2 mg/dL (ref 0.61–1.24)
GFR, Estimated: >60 mL/min
Glucose, Bld: 163 mg/dL — ABNORMAL HIGH (ref 70–99)
Potassium: 3.8 mmol/L (ref 3.5–5.1)
Sodium: 137 mmol/L (ref 135–145)

## 2024-08-31 LAB — CBC
HCT: 39.9 % (ref 39.0–52.0)
Hemoglobin: 13.5 g/dL (ref 13.0–17.0)
MCH: 28.5 pg (ref 26.0–34.0)
MCHC: 33.8 g/dL (ref 30.0–36.0)
MCV: 84.4 fL (ref 80.0–100.0)
Platelets: 168 K/uL (ref 150–400)
RBC: 4.73 MIL/uL (ref 4.22–5.81)
RDW: 13 % (ref 11.5–15.5)
WBC: 10.1 K/uL (ref 4.0–10.5)
nRBC: 0 % (ref 0.0–0.2)

## 2024-08-31 LAB — VAS US DOPPLER PRE CABG
Left ABI: 1.43
Right ABI: 1.38

## 2024-08-31 LAB — GLUCOSE, CAPILLARY
Glucose-Capillary: 168 mg/dL — ABNORMAL HIGH (ref 70–99)
Glucose-Capillary: 155 mg/dL — ABNORMAL HIGH (ref 70–99)
Glucose-Capillary: 174 mg/dL — ABNORMAL HIGH (ref 70–99)
Glucose-Capillary: 181 mg/dL — ABNORMAL HIGH (ref 70–99)

## 2024-08-31 LAB — HEPARIN LEVEL (UNFRACTIONATED)
Heparin Unfractionated: 0.14 [IU]/mL — ABNORMAL LOW (ref 0.30–0.70)
Heparin Unfractionated: 0.23 [IU]/mL — ABNORMAL LOW (ref 0.30–0.70)
Heparin Unfractionated: 0.22 [IU]/mL — ABNORMAL LOW (ref 0.30–0.70)

## 2024-08-31 LAB — MAGNESIUM: Magnesium: 1.6 mg/dL — ABNORMAL LOW (ref 1.7–2.4)

## 2024-08-31 LAB — ABO/RH: ABO/RH(D): O NEG

## 2024-08-31 LAB — PREPARE RBC (CROSSMATCH)

## 2024-08-31 MED ORDER — CEFAZOLIN SODIUM-DEXTROSE 2-4 GM/100ML-% IV SOLN
2.0000 g | INTRAVENOUS | Status: DC
  Filled 2024-08-31: qty 100

## 2024-08-31 MED ORDER — CEFAZOLIN SODIUM-DEXTROSE 2-4 GM/100ML-% IV SOLN
2.0000 g | INTRAVENOUS | Status: AC
  Administered 2024-09-01 (×2): 2 g via INTRAVENOUS
  Filled 2024-08-31: qty 100

## 2024-08-31 MED ORDER — CHLORHEXIDINE GLUCONATE CLOTH 2 % EX PADS
6.0000 | MEDICATED_PAD | Freq: Once | CUTANEOUS | Status: AC
  Administered 2024-08-31: 6 via TOPICAL

## 2024-08-31 MED ORDER — TRANEXAMIC ACID (OHS) BOLUS VIA INFUSION
15.0000 mg/kg | INTRAVENOUS | Status: AC
  Administered 2024-09-01: 1339.5 mg via INTRAVENOUS
  Filled 2024-08-31: qty 1340

## 2024-08-31 MED ORDER — MANNITOL 20 % IV SOLN
INTRAVENOUS | Status: DC
  Filled 2024-08-31 (×2): qty 13

## 2024-08-31 MED ORDER — VANCOMYCIN HCL 1.5 G IV SOLR
1500.0000 mg | INTRAVENOUS | Status: AC
  Administered 2024-09-01: 1500 mg via INTRAVENOUS
  Filled 2024-08-31: qty 30

## 2024-08-31 MED ORDER — CHLORHEXIDINE GLUCONATE 0.12 % MT SOLN
15.0000 mL | Freq: Once | OROMUCOSAL | Status: AC
  Administered 2024-09-01: 15 mL via OROMUCOSAL
  Filled 2024-08-31: qty 15

## 2024-08-31 MED ORDER — DEXMEDETOMIDINE HCL IN NACL 400 MCG/100ML IV SOLN
0.1000 ug/kg/h | INTRAVENOUS | Status: AC
  Administered 2024-09-01: .7 ug/kg/h via INTRAVENOUS
  Filled 2024-08-31: qty 100

## 2024-08-31 MED ORDER — MILRINONE LACTATE IN DEXTROSE 20-5 MG/100ML-% IV SOLN
0.3000 ug/kg/min | INTRAVENOUS | Status: DC
  Filled 2024-08-31: qty 100

## 2024-08-31 MED ORDER — NOREPINEPHRINE 4 MG/250ML-% IV SOLN
0.0000 ug/min | INTRAVENOUS | Status: DC
  Filled 2024-08-31: qty 250

## 2024-08-31 MED ORDER — PLASMA-LYTE A IV SOLN
INTRAVENOUS | Status: AC
  Administered 2024-09-01: 500 mL
  Filled 2024-08-31 (×2): qty 2.5

## 2024-08-31 MED ORDER — INSULIN REGULAR(HUMAN) IN NACL 100-0.9 UT/100ML-% IV SOLN
INTRAVENOUS | Status: AC
  Administered 2024-09-01: 3.8 [IU]/h via INTRAVENOUS
  Filled 2024-08-31 (×2): qty 100

## 2024-08-31 MED ORDER — MAGNESIUM SULFATE 2 GM/50ML IV SOLN
2.0000 g | Freq: Once | INTRAVENOUS | Status: AC
  Administered 2024-08-31: 2 g via INTRAVENOUS
  Filled 2024-08-31: qty 50

## 2024-08-31 MED ORDER — TRANEXAMIC ACID (OHS) PUMP PRIME SOLUTION
2.0000 mg/kg | INTRAVENOUS | Status: DC
  Filled 2024-08-31: qty 1.79

## 2024-08-31 MED ORDER — FREE WATER: 500.0000 mL | Freq: Once | Status: DC

## 2024-08-31 MED ORDER — NITROGLYCERIN IN D5W 200-5 MCG/ML-% IV SOLN
2.0000 ug/min | INTRAVENOUS | Status: DC
  Filled 2024-08-31: qty 250

## 2024-08-31 MED ORDER — TEMAZEPAM 15 MG PO CAPS
15.0000 mg | ORAL_CAPSULE | Freq: Once | ORAL | Status: AC | PRN
  Administered 2024-08-31: 15 mg via ORAL
  Filled 2024-08-31: qty 1

## 2024-08-31 MED ORDER — EPINEPHRINE HCL 5 MG/250ML IV SOLN IN NS
0.0000 ug/min | INTRAVENOUS | Status: DC
  Filled 2024-08-31: qty 250

## 2024-08-31 MED ORDER — PHENYLEPHRINE HCL-NACL 20-0.9 MG/250ML-% IV SOLN
30.0000 ug/min | INTRAVENOUS | Status: AC
  Administered 2024-09-01: 50 ug/min via INTRAVENOUS
  Filled 2024-08-31: qty 250

## 2024-08-31 MED ORDER — HEPARIN 30,000 UNITS/1000 ML (OHS) CELLSAVER SOLUTION
Status: DC
  Filled 2024-08-31: qty 1000

## 2024-08-31 MED ORDER — POTASSIUM CHLORIDE 2 MEQ/ML IV SOLN
80.0000 meq | INTRAVENOUS | Status: DC
  Filled 2024-08-31: qty 40

## 2024-08-31 MED ORDER — EZETIMIBE 10 MG PO TABS
10.0000 mg | ORAL_TABLET | Freq: Every day | ORAL | Status: DC
  Administered 2024-09-02 – 2024-09-05 (×4): 10 mg via ORAL
  Filled 2024-08-31 (×4): qty 1

## 2024-08-31 MED ORDER — METOPROLOL TARTRATE 12.5 MG HALF TABLET
12.5000 mg | ORAL_TABLET | Freq: Once | ORAL | Status: AC
  Administered 2024-09-01: 12.5 mg via ORAL
  Filled 2024-08-31: qty 1

## 2024-08-31 MED ORDER — CHLORHEXIDINE GLUCONATE CLOTH 2 % EX PADS: 6.0000 | MEDICATED_PAD | Freq: Once | CUTANEOUS | Status: DC

## 2024-08-31 MED ORDER — BISACODYL 5 MG PO TBEC
5.0000 mg | DELAYED_RELEASE_TABLET | Freq: Once | ORAL | Status: AC
  Administered 2024-08-31: 5 mg via ORAL
  Filled 2024-08-31: qty 1

## 2024-08-31 MED ORDER — TRANEXAMIC ACID 1000 MG/10ML IV SOLN
1.5000 mg/kg/h | INTRAVENOUS | Status: AC
  Administered 2024-09-01: 1.5 mg/kg/h via INTRAVENOUS
  Filled 2024-08-31: qty 25

## 2024-08-31 NOTE — Anesthesia Preprocedure Evaluation (Addendum)
 Anesthesia Evaluation  Patient identified by MRN, date of birth, ID band Patient awake    Reviewed: Allergy & Precautions, NPO status , Patient's Chart, lab work & pertinent test results  Airway Mallampati: III  TM Distance: >3 FB Neck ROM: Full    Dental  (+) Dental Advisory Given, Teeth Intact   Pulmonary sleep apnea and Continuous Positive Airway Pressure Ventilation    Pulmonary exam normal breath sounds clear to auscultation       Cardiovascular Exercise Tolerance: Good hypertension, Pt. on medications + Past MI   Rhythm:Regular Rate:Normal + Systolic murmurs Echo 08/2024  1. Left ventricular ejection fraction, by estimation, is 60 to 65%. The  left ventricle has normal function. Left ventricular endocardial border  not optimally defined to evaluate regional wall motion. There is mild left  ventricular hypertrophy. Left ventricular diastolic parameters are  consistent with Grade I diastolic dysfunction (impaired relaxation).   2. Right ventricular systolic function is normal. The right ventricular  size is normal. Tricuspid regurgitation signal is inadequate for assessing  PA pressure.   3. The mitral valve is normal in structure. Trivial mitral valve  regurgitation. No evidence of mitral stenosis.   4. The aortic valve was not well visualized. Aortic valve regurgitation  is not visualized. No aortic stenosis is present.   5. The inferior vena cava is normal in size with greater than 50%  respiratory variability, suggesting right atrial pressure of 3 mmHg.   Comparison(s): No prior Echocardiogram.     Neuro/Psych TIACVA, No Residual Symptoms  negative psych ROS   GI/Hepatic Neg liver ROS,GERD  Controlled,,  Endo/Other  diabetes    Renal/GU Renal disease     Musculoskeletal negative musculoskeletal ROS (+)    Abdominal   Peds  Hematology negative hematology ROS (+)   Anesthesia Other Findings    Reproductive/Obstetrics                              Anesthesia Physical Anesthesia Plan  ASA: 3  Anesthesia Plan: General   Post-op Pain Management:    Induction: Intravenous  PONV Risk Score and Plan: 3 and Midazolam  and Treatment may vary due to age or medical condition  Airway Management Planned: Oral ETT  Additional Equipment: Arterial line, CVP, TEE and Ultrasound Guidance Line Placement  Intra-op Plan: Utilization Of Total Body Hypothermia per surgeon request  Post-operative Plan: Post-operative intubation/ventilation  Informed Consent: I have reviewed the patients History and Physical, chart, labs and discussed the procedure including the risks, benefits and alternatives for the proposed anesthesia with the patient or authorized representative who has indicated his/her understanding and acceptance.     Dental advisory given  Plan Discussed with: CRNA  Anesthesia Plan Comments:          Anesthesia Quick Evaluation

## 2024-08-31 NOTE — Progress Notes (Signed)
 Pre-CABG testing has been completed. Preliminary results can be found in CV Proc through chart review.   08/31/24 12:02 PM Cathlyn Collet RVT

## 2024-08-31 NOTE — Progress Notes (Signed)
 The patient's wife would like the doctors to call her once a plan for the patient has been determined.

## 2024-08-31 NOTE — Consult Note (Addendum)
 [address]       [place] [address]             [phone]        Bryan Shepard Bryan Shepard. [place] Medical Record #[id number] Date of Birth: [birth year]  Referring:  Mady Bruckner, MD Primary Care: Severa Rock HERO, FNP Primary Cardiologist:None  Reason for consult: Evaluation for coronary bypass grafting after presenting acute non-ST elevation myocardial infarction.   History of Present Illness:   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  He presented to the emergency room at an outside hospital on 08/29/2024 complaining of chest pain that had been ongoing for the previous 24 hours.  He took baby aspirin  x 4 prior to his arrival.  In the emergency room, EKG demonstrated normal sinus rhythm with no ST or T wave changes.  Chest x-ray was unremarkable.  CTA of the chest was negative for PE did demonstrate dilation of the ascending aorta to 4.2 cm with atherosclerotic calcification.  Initial high-sensitivity troponin was 100 and was monitored serially over the next 24 hours and eventually rose to peak at 478.  Other laboratory data was unremarkable.  [name] was started on heparin  infusion for acute coronary syndrome.  Cardiology was consulted and plan appropriate for transfer to [facility] for further evaluation with left heart catheterization.  This study was carried out yesterday demonstrating severe multivessel coronary artery disease with tandem 90% and 80% lesions in proximal LAD.  There was 5% stenoses in the ramus intermediate and a 1 point.  Distal RCA and total occlusion is before the takeoff of the PDA.  There were left-to-right collaterals filling the territory of the PDA and PL.  An echocardiogram obtained yesterday shows preserved biventricular function.  There is mild left ventricular hypertrophy.  There were  no significant valvular abnormalities identified.  CT surgery has been asked to evaluate Bryan Shepard for consideration of coronary bypass grafting for his severe multivessel coronary artery disease.  Currently, Bryan Shepard is resting comfortably in bed on 4E.  He denies any chest pain.  Heparin  is infusing.  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  He is currently a naval architect.  He is right-handed.  He gets regular dental care and currently has no dental issues.  He is married but his wife was not present at the time of this encounter.    Current Activity/ Functional Status: Patient is independent with mobility/ambulation, transfers, ADL's, IADL's.   Zubrod Score: At the time of surgery this patient's most appropriate activity status/level should be described as: []     0    Normal activity, no symptoms [x]     1    Restricted in physical strenuous activity but ambulatory, able to do out light work []     2    Ambulatory and capable of self care, unable to do work activities, up and about                 more than 50%  Of the time                            []     3    Only limited  self care, in bed greater than 50% of waking hours []     4    Completely disabled, no self care, confined to bed or chair []     5    Moribund  Past Medical History:  Diagnosis Date   Acid reflux    Colon polyps    Diabetes mellitus type 2 in nonobese (HCC) 12/13/2020   Family history of genetic mutation for hereditary nonpolyposis colorectal cancer (HNPCC)    Family history of prostate cancer    Family history of stomach cancer    HOH (hard of hearing)    Hypertension    Sleep apnea    CPAP   Stroke (HCC) 2015   TIA (transient ischemic attack) 2016    Past Surgical History:  Procedure Laterality Date   CHOLECYSTECTOMY N/A 04/17/2020   Procedure: LAPAROSCOPIC CHOLECYSTECTOMY;  Surgeon: Mavis Anes, MD;  Location: [facility] ORS;  Service:  General;  Laterality: N/A;   COLONOSCOPY  2018   COLONOSCOPY WITH PROPOFOL  N/A 12/07/2021   Surgeon: Cindie Dunnings K, DO;   nonbleeding internal hemorrhoids, 8 mm polyp (tubular adenoma) in the descending colon removed.  Recommended 5-year surveillance.   LEFT HEART CATH AND CORONARY ANGIOGRAPHY N/A 08/30/2024   Procedure: LEFT HEART CATH AND CORONARY ANGIOGRAPHY;  Surgeon: Mady Bruckner, MD;  Location: [facility] INVASIVE CV LAB;  Service: Cardiovascular;  Laterality: N/A;   POLYPECTOMY  12/07/2021   Procedure: POLYPECTOMY;  Surgeon: Cindie Dunnings POUR, DO;  Location: [facility] ENDO SUITE;  Service: Endoscopy;;    Social History   Tobacco Use  Smoking Status Never  Smokeless Tobacco Never    Social History   Substance and Sexual Activity  Alcohol Use No     No Known Allergies  Current Facility-Administered Medications  Medication Dose Route Frequency Provider Last Rate Last Admin   acetaminophen  (TYLENOL ) tablet 500 mg  500 mg Oral Q6H PRN End, Bruckner, MD       aspirin  EC tablet 81 mg  81 mg Oral Daily End, Christopher, MD   81 mg at 08/31/24 0856   carvedilol  (COREG ) tablet 3.125 mg  3.125 mg Oral BID WC End, Bruckner, MD   3.125 mg at 08/31/24 0929   [START ON 09/01/2024] ezetimibe  (ZETIA ) tablet 10 mg  10 mg Oral Daily Henry Shaver B, NP       heparin  ADULT infusion 100 units/mL (25000 units/250mL)  1,700 Units/hr Intravenous Continuous Gretel Prentice BIRCH, RPH 15 mL/hr at 08/31/24 0438 1,500 Units/hr at 08/31/24 9561   insulin  aspart (novoLOG ) injection 0-20 Units  0-20 Units Subcutaneous TID WC End, Bruckner, MD   4 Units at 08/31/24 0630   insulin  aspart (novoLOG ) injection 0-5 Units  0-5 Units Subcutaneous QHS End, Bruckner, MD   2 Units at 08/30/24 2107   isosorbide  mononitrate (IMDUR ) 24 hr tablet 30 mg  30 mg Oral Daily End, Christopher, MD   30 mg at 08/31/24 1112   magnesium  sulfate IVPB 2 g 50 mL  2 g Intravenous Once Danton Reyes DASEN, MD 50 mL/hr at 08/31/24  1122 2 g at 08/31/24 1122   melatonin tablet 5 mg  5 mg Oral QHS PRN End, Bruckner, MD       morphine  (PF) 2 MG/ML injection 2 mg  2 mg Intravenous Q3H PRN End, Bruckner, MD       oxyCODONE  (Oxy IR/ROXICODONE ) immediate release tablet 5-10 mg  5-10 mg Oral Q4H PRN End, Bruckner, MD   5 mg at 08/31/24 9366   polyethylene glycol (MIRALAX  /  GLYCOLAX ) packet 17 g  17 g Oral Daily PRN End, Lonni, MD       prochlorperazine  (COMPAZINE ) injection 5 mg  5 mg Intravenous Q6H PRN End, Lonni, MD   5 mg at 08/29/24 2218   sodium chloride  flush (NS) 0.9 % injection 3 mL  3 mL Intravenous Q12H End, Christopher, MD   3 mL at 08/31/24 9096   sodium chloride  flush (NS) 0.9 % injection 3 mL  3 mL Intravenous PRN End, Lonni, MD        Medications Prior to Admission  Medication Sig Dispense Refill Last Dose/Taking   albuterol  (VENTOLIN  HFA) 108 (90 Base) MCG/ACT inhaler Inhale 2 puffs into the lungs every 6 (six) hours as needed for shortness of breath.   Unknown   aspirin  81 MG chewable tablet Chew 81 mg by mouth daily.   08/29/2024 Morning   enalapril  (VASOTEC ) 2.5 MG tablet Take 1 tablet (2.5 mg total) by mouth daily.   08/29/2024 Morning   Accu-Chek FastClix Lancets MISC CHECK BS DAILY DX E11.9 100 each 3    Blood Glucose Monitoring Suppl (ACCU-CHEK GUIDE ME) w/Device KIT CHECK BS DAILY DX E11.9 1 kit 0    Continuous Glucose Sensor (DEXCOM G7 SENSOR) MISC 1 Device by Does not apply route every 14 (fourteen) days. 1 each 11    glucose blood (FREESTYLE LITE) test strip Check BS daily Dx E11.9 100 strip 3     Family History  Problem Relation Age of Onset   Melanoma Mother 5   Hypertension Mother    Transient ischemic attack Mother    Stroke Mother    Heart disease Mother    Prostate cancer Father 27   Stomach cancer Father 17       PMS2+; Lynch syndrome   Melanoma Father    Diabetes Father    Heart disease Father    Stroke Maternal Grandmother    Lung cancer Paternal  Grandmother    Throat cancer Paternal Grandmother    Prostate cancer Paternal Grandfather 28   Bladder Cancer Other        MGM's mother with urethra cancer     Review of Systems:       Cardiac Review of Systems: Y or  [    ]= no  Chest Pain [present on admission, resolved]  Resting SOB [   ] Exertional SOB  [  ]  Orthopnea [  ]   Pedal Edema [   ]    Palpitations [  ] Syncope  [  ]   Presyncope [   ]  General Review of Systems: [Y] = yes [  ]=no Constitional: recent weight change [  ]; anorexia [  ]; fatigue [  ]; nausea [  ]; night sweats [  ]; fever [  ]; or chills [  ]                                                               Dental: Last Dentist visit:   Eye : blurred vision [  ]; diplopia [   ]; vision changes [  ];  Amaurosis fugax[  ]; Resp: cough [  ];  wheezing[  ];  hemoptysis[  ]; shortness of breath[  ]; paroxysmal nocturnal dyspnea[  ];  dyspnea on exertion[  ]; or orthopnea[  ];  GI:  gallstones[  ], vomiting[  ];  dysphagia[  ]; melena[  ];  hematochezia [  ]; heartburn[  ];   Hx of  Colonoscopy[  ]; GU: kidney stones [  ]; hematuria[  ];   dysuria [  ];  nocturia[  ];  history of     obstruction [  ]; urinary frequency [  ]             Skin: rash, swelling[  ];, hair loss[  ];  peripheral edema[  ];  or itching[  ]; Musculosketetal: myalgias[  ];  joint swelling[  ];  joint erythema[  ];  joint pain[  ];  back pain[  ];  Heme/Lymph: bruising[  ];  bleeding[  ];  anemia[  ];  Neuro: TIA[  ];  headaches[  ];  stroke[  ];  vertigo[  ];  seizures[  ];   paresthesias[  ];  difficulty walking[  ];  Psych:depression[  ]; anxiety[  ];  Endocrine: diabetes[ Y, type II];  thyroid  dysfunction[  ];               Physical Exam: BP 113/69 (BP Location: Right Arm)   Pulse 90   Temp 98.2 F (36.8 C) (Oral)   Resp 16   Ht 5' 7 (1.702 m)   Wt 89.3 kg   SpO2 97%   BMI 30.83 kg/m    General appearance: alert, cooperative, and no distress Head: Normocephalic, without  obvious abnormality, atraumatic Neck: no adenopathy, no carotid bruit, no JVD, and supple, symmetrical, trachea midline Lymph nodes: No cervical or clavicular adenopathy Resp: Breath sounds are full, equal, and clear to auscultation. Cardio: Regular rate and rhythm, monitor shows normal sinus rhythm with no significant arrhythmias on review.  He has a grade 2/6 to 3/6 systolic murmur most prominent at the right upper sternal border GI: Soft, no tenderness, active bowel sounds Extremities: All well-perfused with no deformities.  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   Dist RCA lesion is 100% stenosed.   Ramus lesion is 95% stenosed with 90% stenosed side branch in Lat Ramus.   1st Mrg lesion is 95% stenosed.   RPAV lesion is 100% stenosed.   RPDA lesion is 100% stenosed.   Prox LAD lesion is 90% stenosed.   Mid LAD lesion is 80% stenosed.   LV end diastolic pressure is mildly elevated.   There is no aortic valve stenosis.   Anticipated discharge date to be determined.   Continue aspirin  81 mg daily.  Defer P2Y12 inhibitor pending cardiac surgery consultation.  Resume heparin  infusion 2 hours after TR band removal.   Conclusions: Severe three-vessel coronary artery disease, as detailed below, including sequential 90% and 80% proximal/mid LAD stenoses, 90-95% stenoses of bifurcating ramus intermedius, 95% stenosis of OM1, and chronic total occlusion of distal RCA. Mildly elevated left ventricular filling pressure (LVEDP 20 mmHg).   Recommendations: Inpatient cardiac surgery consultation for CABG. Resume heparin  infusion 2 hours after TR band has been removed. Aggressive secondary prevention of coronary artery disease. Transition from nitroglycerin  paste to isosorbide  mononitrate for antianginal  therapy.   Lonni Hanson, MD [facility]  Coronary Findings  Diagnostic Dominance: Right Left Main  Vessel is large.  Vessel is angiographically normal.    Left Anterior Descending  Vessel is moderate in size.  Prox LAD lesion is 90% stenosed. The lesion is focal and eccentric.  Mid LAD lesion is 80% stenosed.    Ramus Intermedius  Vessel is moderate in size.  Ramus lesion is 95% stenosed with 90% stenosed side branch in Lat Ramus.    Left Circumflex  Vessel is moderate in size.    First Obtuse Marginal Branch  1st Mrg lesion is 95% stenosed.    Second Obtuse Marginal Branch  Vessel is small in size.    Right Coronary Artery  Vessel is large.  Prox RCA lesion is 40% stenosed.  Dist RCA lesion is 100% stenosed. The lesion is chronically occluded.    Right Posterior Descending Artery  Collaterals  RPDA filled by collaterals from 2nd Sept.    RPDA lesion is 100% stenosed.    Right Posterior Atrioventricular Artery  Collaterals  RPAV filled by collaterals from Dist Cx.    RPAV lesion is 100% stenosed.    First Right Posterolateral Branch  Vessel is small in size.    Second Right Posterolateral Branch  Vessel is small in size.    Third Right Posterolateral Branch  Vessel is small in size.    Intervention   No interventions have been documented.   Left Heart  Left Ventricle LV end diastolic pressure is mildly elevated. LVEDP 20 mmHg.  Aortic Valve There is no aortic valve stenosis.   Coronary Diagrams  Diagnostic Dominance: Right         ECHOCARDIOGRAM REPORT       Patient Name:   Bryan Shepard. Date of Exam: 08/30/2024  Medical Rec #:  [id number]         Height:       67.0 in  Accession #:    [id number]        Weight:       200.0 lb  Date of Birth:  [birth year]          BSA:          2.022 m  Patient Age:    68 years          BP:           175/85 mmHg  Patient Gender: M                 HR:           71 bpm.  Exam Location:  [facility]    Procedure: 2D Echo, Color Doppler and Cardiac Doppler (Both Spectral and  Color            Flow Doppler were utilized during procedure).   Indications:    NSTEMI I21.4    History:        Patient has no prior history of Echocardiogram  examinations.                 Risk Factors:Hypertension and Diabetes.    Sonographer:    Tinnie Gosling RDCS  Referring Phys: [id number] CAROLE N HALL   IMPRESSIONS     1. Left ventricular ejection fraction, by estimation, is 60 to 65%. The  left ventricle has normal function. Left ventricular endocardial border  not optimally defined to evaluate regional wall motion. There is mild left  ventricular hypertrophy. Left  ventricular diastolic parameters are consistent with Grade I diastolic  dysfunction (impaired relaxation).   2. Right ventricular systolic function is normal. The  right ventricular  size is normal. Tricuspid regurgitation signal is inadequate for assessing  PA pressure.   3. The mitral valve is normal in structure. Trivial mitral valve  regurgitation. No evidence of mitral stenosis.   4. The aortic valve was not well visualized. Aortic valve regurgitation  is not visualized. No aortic stenosis is present.   5. The inferior vena cava is normal in size with greater than 50%  respiratory variability, suggesting right atrial pressure of 3 mmHg.   Comparison(s): No prior Echocardiogram.   FINDINGS   Left Ventricle: Left ventricular ejection fraction, by estimation, is 60  to 65%. The left ventricle has normal function. Left ventricular  endocardial border not optimally defined to evaluate regional wall motion.  Strain was performed and the global  longitudinal strain is indeterminate. The left ventricular internal cavity  size was normal in size. There is mild left ventricular hypertrophy. Left  ventricular diastolic parameters are consistent with Grade I diastolic  dysfunction (impaired relaxation).   Normal left ventricular filling  pressure.   Right Ventricle: The right ventricular size is normal. No increase in  right ventricular wall thickness. Right ventricular systolic function is  normal. Tricuspid regurgitation signal is inadequate for assessing PA  pressure.   Left Atrium: Left atrial size was normal in size.   Right Atrium: Right atrial size was normal in size.   Pericardium: There is no evidence of pericardial effusion.   Mitral Valve: The mitral valve is normal in structure. Trivial mitral  valve regurgitation. No evidence of mitral valve stenosis.   Tricuspid Valve: The tricuspid valve is normal in structure. Tricuspid  valve regurgitation is not demonstrated. No evidence of tricuspid  stenosis.   Aortic Valve: The aortic valve was not well visualized. Aortic valve  regurgitation is not visualized. No aortic stenosis is present.   Pulmonic Valve: The pulmonic valve was not well visualized. Pulmonic valve  regurgitation is not visualized. No evidence of pulmonic stenosis.   Aorta: The aortic root and ascending aorta are structurally normal, with  no evidence of dilitation.   Venous: The inferior vena cava is normal in size with greater than 50%  respiratory variability, suggesting right atrial pressure of 3 mmHg.   IAS/Shunts: No atrial level shunt detected by color flow Doppler.   Additional Comments: 3D was performed not requiring image post processing  on an independent workstation and was indeterminate.     LEFT VENTRICLE  PLAX 2D  LVIDd:         4.20 cm   Diastology  LVIDs:         3.00 cm   LV e' medial:    5.98 cm/s  LV PW:         1.20 cm   LV E/e' medial:  14.3  LV IVS:        1.20 cm   LV e' lateral:   10.30 cm/s  LVOT diam:     2.20 cm   LV E/e' lateral: 8.3  LV SV:         69  LV SV Index:   34  LVOT Area:     3.80 cm  LV IVRT:       90 msec     RIGHT VENTRICLE             IVC  RV S prime:     12.00 cm/s  IVC diam: 1.50 cm  TAPSE (M-mode): 2.3 cm   LEFT ATRIUM  Index        RIGHT ATRIUM           Index  LA diam:        4.00 cm 1.98 cm/m   RA Area:     11.90 cm  LA Vol (A2C):   27.4 ml 13.55 ml/m  RA Volume:   23.80 ml  11.77 ml/m  LA Vol (A4C):   34.2 ml 16.91 ml/m  LA Biplane Vol: 33.1 ml 16.37 ml/m   AORTIC VALVE  LVOT Vmax:   91.10 cm/s  LVOT Vmean:  65.900 cm/s  LVOT VTI:    0.181 m    AORTA  Ao Root diam: 2.60 cm  Ao Asc diam:  3.40 cm   MITRAL VALVE  MV Area (PHT): 2.53 cm     SHUNTS  MV Decel Time: 300 msec     Systemic VTI:  0.18 m  MV E velocity: 85.50 cm/s   Systemic Diam: 2.20 cm  MV A velocity: 113.00 cm/s  MV E/A ratio:  0.76   Vishnu Priya Mallipeddi  Electronically signed by Diannah Late Mallipeddi  Signature Date/Time: 08/30/2024/12:27:12 PM       CT Angio Chest PE W and/or Wo Contrast Result Date: 08/29/2024 EXAM: CTA of the Chest with contrast for PE 08/29/2024 07:36:01 PM TECHNIQUE: CTA of the chest was performed after the administration of intravenous contrast. 75 ml omnipaque  350 Multiplanar reformatted images are provided for review. MIP images are provided for review. Automated exposure control, iterative reconstruction, and/or weight based adjustment of the mA/kV was utilized to reduce the radiation dose to as low as reasonably achievable. COMPARISON: Chest x-ray from 08/29/2024. CLINICAL HISTORY: Chest pain. FINDINGS: PULMONARY ARTERIES: No intraluminal filling defect to suggest pulmonary embolism is noted. Main pulmonary artery is normal in caliber. The pulmonary artery shows a normal branching pattern bilaterally. MEDIASTINUM: The heart is not significantly enlarged. Scattered coronary calcifications are noted. The thoracic aorta shows atherosclerotic calcifications. Mild dilatation of the ascending aorta to 4.2 cm is noted. The thoracic inlet is within normal limits. The esophagus, as visualized is within normal limits. LYMPH NODES: No mediastinal, hilar or axillary lymphadenopathy. LUNGS AND PLEURA: The  lungs are well aerated bilaterally. No focal infiltrate or effusion is seen. No pneumothorax. UPPER ABDOMEN: The upper abdomen is unremarkable. Prior cholecystectomy is seen. SOFT TISSUES AND BONES: Degenerative changes of the thoracic spine are seen. No rib abnormality is noted. No acute soft tissue abnormality. IMPRESSION: 1. No evidence of pulmonary embolism. 2. 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 This recommendation follows 2010 ACCF/AHA/AATS/ACR/ASA/SCA/SCAI/SIR/STS/SVM Guidelines for the Diagnosis and Management of Patients with Thoracic Aortic Disease. Circulation. 2010; 121: Z733-z630. Aortic aneurysm NOS (ICD10-I71.9) Electronically signed by: Oneil Devonshire MD 08/29/2024 07:45 PM EST RP Workstation: [id number]   DG Chest Port 1 View Result Date: 08/29/2024 EXAM: 1 VIEW(S) XRAY OF THE CHEST 08/29/2024 06:02:00 PM COMPARISON: 11/13/2014 CLINICAL HISTORY: CP FINDINGS: LUNGS AND PLEURA: No focal pulmonary opacity. No pleural effusion. No pneumothorax. HEART AND MEDIASTINUM: No acute abnormality of the cardiac and mediastinal silhouettes. BONES AND SOFT TISSUES: No acute osseous abnormality. IMPRESSION: 1. No acute cardiopulmonary process. Electronically signed by: Franky Crease MD 08/29/2024 06:07 PM EST RP Workstation: [id number]     I have independently reviewed the above radiologic studies and discussed with the patient   Recent Lab Findings: Lab Results  Component Value Date   WBC 10.1 08/31/2024   HGB  13.5 08/31/2024   HCT 39.9 08/31/2024   PLT 168 08/31/2024   GLUCOSE 163 (H) 08/31/2024   CHOL 146 08/30/2024   TRIG 148 08/30/2024   HDL 31 (L) 08/30/2024   LDLCALC 86 08/30/2024   ALT 38 10/03/2023   AST 24 10/03/2023   NA 137 08/31/2024   K 3.8 08/31/2024   CL 105 08/31/2024   CREATININE 1.20 08/31/2024   BUN 16 08/31/2024   CO2 22 08/31/2024   TSH 1.290 01/01/2023   INR 1.0  08/29/2024   HGBA1C 8.4 (H) 08/30/2024      Assessment / Plan:      - 68 year old male with the above described past medical history positive family history for heart disease presents with acute non-ST elevation myocardial infarction and preserved biventricular function.  Left heart catheterization demonstrates severe three-vessel coronary artery disease.  We agree that coronary bypass grafting would be his best option for revascularization.  Procedure and expected perioperative course were discussed with him in detail.  He would like to proceed with preoperative evaluation in anticipation of coronary bypass grafting later this week.  Dr. Daniel will review [name] clinical data and discussion regarding timing of surgical follow.  - Type 2 diabetes mellitus: Hemoglobin A1c 8.8 on admission.  Apparently not taking diabetes medications prior to admission.  - Hypertension: On enalapril  2.5 mg daily prior to admission.  Has mild LV hypertrophy on echo.  - History of CVA x 2: The first occurred while he was serving in the [employer] and was primarily manifested by slurred speech.  The second was similar and occurred while he was serving as a Psychologist, Forensic in [employer]  in 2016.  He has no residual neurologic deficits.  Will check carotid Dopplers prior to surgery.   I  spent 25 minutes counseling the patient face to face.   Laurel JUDITHANN Becket, PA-C  08/31/2024 12:00 PM  [name] is a pleasant 68 year old man who presents with NSTEMI, found to have multivessel coronary artery disease on LHC.  He started having chest pain after unloading his car after a trip.  At that time, his BP was 220s/100s so he called 911.  He is a good operative candidate.  He exercises several times a week and works as a engineer, water.  He is a never smoker and does not drink alcohol. He lives in a house with his wife and is independent in all ADLs.  His TTE demonstrated normal Biventricular function and no  valvular disease.    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  He is right handed.  He has not had chest surgery or radiation.  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  I discussed the general nature of the procedure, including the need for general anesthesia, the incisions to be used, the use of cardiopulmonary bypass, and the use of temporary pacemaker wires and drainage tubes postoperatively with [name].  We discussed the expected hospital stay, overall recovery and short and long term outcomes. I informed him of the indications, risks, benefits and alternatives.   He understands the risks include, but are not limited to death, stroke, MI, DVT/PE, bleeding, possible need for transfusion, infections, cardiac arrhythmias, as well as other organ system dysfunction including respiratory (eg:  prolonged ventilation), renal, or GI complications.   We will obtain vein mapping to look at this legs given the varicosities.  I explained there is a chance we may need to use his left radial artery if he does not have sufficient or appropriate venous conduit.  Plan for CABG tomorrow.  Con Clunes, MD Cardiothoracic Surgery Pager: [phone]

## 2024-08-31 NOTE — Telephone Encounter (Signed)
 Pharmacy Patient Advocate Encounter  Insurance verification completed.    The patient is insured through [employer].     Ran test claim for Generic Entresto 24-26mg  and the current 30 day co-pay is $16.  Ran test claim for Jardiance  10mg  and the current 30 day co-pay is $43.   This test claim was processed through [employer]- copay amounts may vary at other pharmacies due to [employer], or as the patient moves through the different stages of their insurance plan.

## 2024-08-31 NOTE — Progress Notes (Addendum)
 PHARMACY - ANTICOAGULATION CONSULT NOTE  Pharmacy Consult for Heparin  Infusion Indication: chest pain/ACS  No Known Allergies  Patient Measurements: Height: 5' 7 (170.2 cm) Weight: 89.3 kg (196 lb 13.9 oz) IBW/kg (Calculated) : 66.1 HEPARIN  DW (KG): 85  Vital Signs: Temp: 98.2 F (36.8 C) (11/25 1644) Temp Source: Oral (11/25 1644) BP: 114/66 (11/25 1644) Pulse Rate: 80 (11/25 1644)  Labs: Recent Labs    08/29/24 1813 08/29/24 1813 08/30/24 0535 08/31/24 0342 08/31/24 1030 08/31/24 1833  HGB 14.5  --  14.2 13.5  --   --   HCT 42.5  --  41.3 39.9  --   --   PLT 183  --  163 168  --   --   APTT 30  --   --   --   --   --   LABPROT 13.4  --   --   --   --   --   INR 1.0  --   --   --   --   --   HEPARINUNFRC  --    < > 0.19* 0.14* 0.23* 0.22*  CREATININE 1.24  --  1.05 1.20  --   --    < > = values in this interval not displayed.    Medications:  Not on anticoagulation at home per chart review  Assessment: Patient is a 68 year old male with a past medical history of diabetes, HTN, and TIA/CVA who presented with intermittent chest pain since last night. Also reports shortness of breath and dizziness. Troponin level elevated at 100>172. Pharmacy was consulted to start this patient on a heparin  infusion for ACS/STEMI.  Pt underwent LHC 08/30/24 which showed severe 3-vessel CAD. Pt is to undergo inpatient eval for CABG. Pharmacy consulted to dose heparin    HL 0.22 on 1700 units/hr, subtherapeutic despite rate increase -No issues with line, infusion and no s/sx bleeding per RN  Goal of Therapy:  Heparin  level 0.3-0.7 units/ml Monitor platelets by anticoagulation protocol: Yes   Plan:  -Increase heparin  to 1950 units/hr -heparin  level in 6 hrs F/u surgical plans  Sharyne Glatter, PharmD, BCCCP Critical Care Clinical Pharmacist 08/31/2024 7:29 PM

## 2024-08-31 NOTE — Progress Notes (Signed)
 Bilateral lower extremity vein mapping has been completed. Preliminary results can be found in CV Proc through chart review.   08/31/24 3:56 PM Cathlyn Collet RVT

## 2024-08-31 NOTE — Progress Notes (Signed)
 PHARMACY - ANTICOAGULATION CONSULT NOTE  Pharmacy Consult for Heparin  Infusion Indication: chest pain/ACS  No Known Allergies  Patient Measurements: Height: 5' 7 (170.2 cm) Weight: 89.3 kg (196 lb 13.9 oz) IBW/kg (Calculated) : 66.1 HEPARIN  DW (KG): 85  Vital Signs: Temp: 98.2 F (36.8 C) (11/25 1055) Temp Source: Oral (11/25 1055) BP: 113/69 (11/25 1055) Pulse Rate: 82 (11/25 0740)  Labs: Recent Labs    08/29/24 1813 08/30/24 0535 08/31/24 0342 08/31/24 1030  HGB 14.5 14.2 13.5  --   HCT 42.5 41.3 39.9  --   PLT 183 163 168  --   APTT 30  --   --   --   LABPROT 13.4  --   --   --   INR 1.0  --   --   --   HEPARINUNFRC  --  0.19* 0.14* 0.23*  CREATININE 1.24 1.05 1.20  --     Medications:  Not on anticoagulation at home per chart review  Assessment: Patient is a 68 year old male with a past medical history of diabetes, HTN, and TIA/CVA who presented with intermittent chest pain since last night. Also reports shortness of breath and dizziness. Troponin level elevated at 100>172. Pharmacy was consulted to start this patient on a heparin  infusion for ACS/STEMI.  Pt underwent LHC 08/30/24 which showed severe 3-vessel CAD. Pt is to undergo inpatient eval for CABG. Pharmacy consulted to dose heparin     -heparin  level = 0.23 on 1300 units/hr   Goal of Therapy:  Heparin  level 0.3-0.7 units/ml Monitor platelets by anticoagulation protocol: Yes   Plan:  -Increase heparin  to 1500 units/hr -heparin  level in 6 hrs  Prentice Poisson, PharmD Clinical Pharmacist **Pharmacist phone directory can now be found on [email or web address] ([email or web address]).  Listed under [facility].

## 2024-08-31 NOTE — Progress Notes (Addendum)
 Progress Note  Patient Name: Bryan Shepard. Date of Encounter: 08/31/2024 [facility] Cardiologist: None   Interval Summary    No complaints, or chest pain overnight.   Vital Signs Vitals:   08/31/24 0740 08/31/24 0907 08/31/24 0923 08/31/24 1055  BP: 109/65 113/78 120/71 113/69  Pulse: 82     Resp: 15 13 17    Temp: 99 F (37.2 C)   98.2 F (36.8 C)  TempSrc: Oral   Oral  SpO2: 95%     Weight:      Height:        Intake/Output Summary (Last 24 hours) at 08/31/2024 1123 Last data filed at 08/31/2024 0908 Gross per 24 hour  Intake 576.9 ml  Output 600 ml  Net -23.1 ml      08/31/2024    5:28 AM 08/29/2024    5:33 PM 07/08/2024    1:14 PM  Last 3 Weights  Weight (lbs) 196 lb 13.9 oz 199 lb 15.3 oz 200 lb  Weight (kg) 89.3 kg 90.7 kg 90.719 kg      Telemetry/ECG   Sinus Rhythm - Personally Reviewed  Physical Exam  GEN: No acute distress.   Neck: No JVD Cardiac: RRR, no murmurs, rubs, or gallops.  Respiratory: Clear to auscultation bilaterally. GI: Soft, nontender, non-distended  MS: No edema VAS: right radial cath site   Assessment & Plan   68 y.o. male with a hx of HTN, HLD, Type 2 DM and prior CVA who was seen 08/30/2024 for the evaluation of NSTEMI at the request of Dr. Vicci.   NSTEMI -- Presented with progressive chest pain and dyspnea on exertion over the past week.  EKG at [facility] showed slight ST depression along lateral leads. hsTn peaked at 478 -- Transferred to [facility] for further evaluation with cardiac catheterization, 11/24 noting severe three-vessel CAD including sequential 90% and 80% proximal/mid LAD stenosis, 90 to 95% stenosis of bifurcating ramus, 95% stenosis of OM1 and CTO of distal RCA.  CT surgery consultation pending --Echo with LVEF of 60 to 65%, grade 1 diastolic dysfunction, normal RV, trivial MR -- Continue IV heparin , aspirin , carvedilol  3.125 mg twice daily, Imdur  30 mg daily  Hypertension -- Initially  elevated, now well-controlled -- Continue carvedilol  3.125 mg twice daily, Imdur  30 mg daily  Hyperlipidemia -- LDL 86, HDL 31 -- atorvastatin  40 mg daily was added on admission but he says he and his wife don't believe there is data to support them.   -- Agreeable to try zetia   Can continue to have conversations about this issue.  DM -- Hgb A1c 8.4 -- on SSI   Ascending Aortic Aneurysm -- Measured 4.2 cm by CT this admission with annual imaging recommended   Dupuytren's Contracture -- Being followed at [facility] for this and he reported possible outpatient surgery  For questions or updates, please contact  [facility] Please consult [email or web address] for contact info under    Signed, Manuelita Rummer, NP    ATTENDING ATTESTATION  I have seen, examined and evaluated the patient this afternoon on rounds along with was rapid, NP.  After reviewing all the available data and chart, we discussed the patients laboratory, study & physical findings as well as symptoms in detail.  I agree with her findings, examination as well as impression recommendations as per our discussion.    Attending adjustments noted in italics.   I personally reviewed the cath films, agree that his really only good option is CABG.  Will need to continue to drive home the point of lipid management and benefits of statin.  Overwhelming data would support the use of statin for CAD.  Not sure where the argument that he is proposing comes from.  Will continue to titrate GDMT for CAD pending CABG evaluation.  Agree with continuing IV heparin  given ACS presentation and severe disease.  Will follow along.    Alm MICAEL Clay, MD, MS Alm Clay, M.D., M.S. Interventional Cardiologist  [facility] Pager # [phone]

## 2024-08-31 NOTE — Plan of Care (Signed)
   Problem: Education: Goal: Ability to describe self-care measures that may prevent or decrease complications (Diabetes Survival Skills Education) will improve Outcome: Progressing Goal: Individualized Educational Video(s) Outcome: Progressing   Problem: Coping: Goal: Ability to adjust to condition or change in health will improve Outcome: Progressing

## 2024-08-31 NOTE — Progress Notes (Signed)
 PHARMACY - ANTICOAGULATION CONSULT NOTE  Pharmacy Consult for heparin  Indication: CAD awaiting CABG evaluation  Labs: Recent Labs    08/29/24 1813 08/30/24 0535 08/31/24 0342  HGB 14.5 14.2 13.5  HCT 42.5 41.3 39.9  PLT 183 163 168  APTT 30  --   --   LABPROT 13.4  --   --   INR 1.0  --   --   HEPARINUNFRC  --  0.19* 0.14*  CREATININE 1.24 1.05  --    Assessment: 68yo male subtherapeutic on heparin  after resuming post cath, level lower than previously at lower rate but suspect heparin  will continue to accumulate but remains subtherapeutic; no infusion issues or signs of bleeding per RN.  Goal of Therapy:  Heparin  level 0.3-0.7 units/ml   Plan:  Increase heparin  infusion by 2-3 units/kg/hr to 1500 units/hr. Check level in 6 hours.   Marvetta Dauphin, PharmD, BCPS 08/31/2024 4:34 AM

## 2024-08-31 NOTE — Progress Notes (Signed)
 Bryan Shepard Bryan Shepard.  FMW:[id number] DOB: 1955/10/21 DOA: 08/29/2024 PCP: Severa Rock HERO, FNP    Brief Narrative:  68 year old with a history of HTN, DM2, GERD, CVA x 2, and OSA who presented to the [facility] ED 11/23 with complaints of intermittent substernal chest pain of approximately 12 hours duration which radiated into the left arm.  This was associated with shortness of breath.  In the ER he was found to have systolic blood pressures in the 190s.  D-dimer was elevated but CTa of the chest revealed no evidence of pulmonary embolism though it did note mild dilatation of the ascending aorta measuring 4.2 cm.  Initial EKG was without acute findings but his first troponin was elevated at 100 with a follow-up further elevated at 172.  He was placed on a heparin  drip for NSTEMI and ultimately transferred to [facility] to undergo cardiac cath.  Cardiac cath was completed 08/30/2024 at [facility] and revealed severe three-vessel coronary artery disease including sequential 90% and 80% proximal/mid LAD stenoses, 90-95% stenoses of bifurcating ramus intermedius, 95% stenosis of OM1, and chronic total occlusion of the distal RCA.  Inpatient TCTS consultation for CABG was requested.  Goals of Care:   Code Status: Full Code   DVT prophylaxis: IV heparin    Interim Hx: The patient underwent cardiac cath yesterday revealing severe three-vessel CAD.  He has been stable post cath.  He is afebrile.  Blood pressure is now much better controlled.  No chest pain or shortness of breath at the time of my visit today.  Assessment & Plan:  NSTEMI in setting of severe three-vessel CAD unamenable to percutaneous intervention TCTS evaluating for inpatient CABG - medical care in interim per Cardiology  HTN Blood pressure presently well-controlled  Hypomagnesemia Supplement to goal of 2.0  Uncontrolled DM2 A1c 8.13 April 2024 - CBG control improved on the inpatient side - continue to monitor  History of  CVA x 2 with TIA No residual deficits  Obesity - Body mass index is 30.83 kg/m.  Mild dilatation of the ascending aorta measuring 4.2 cm Will need annual imaging follow-up with CTa or MRI   Family Communication: No family present at time of exam Disposition: For CABG 11/26   Objective: Blood pressure 120/71, pulse 82, temperature 99 F (37.2 C), temperature source Oral, resp. rate 17, height 5' 7 (1.702 m), weight 89.3 kg, SpO2 95%.  Intake/Output Summary (Last 24 hours) at 08/31/2024 1051 Last data filed at 08/31/2024 0908 Gross per 24 hour  Intake 576.9 ml  Output 600 ml  Net -23.1 ml   Filed Weights   08/29/24 1733 08/31/24 0528  Weight: 90.7 kg 89.3 kg    Examination: General: No acute respiratory distress Lungs: Clear to auscultation bilaterally without wheezes or crackles Cardiovascular: Regular rate and rhythm without murmur gallop or rub normal S1 and S2 Abdomen: Nontender, nondistended, soft, bowel sounds positive, no rebound, no ascites, no appreciable mass Extremities: No significant cyanosis, clubbing, or edema bilateral lower extremities  CBC: Recent Labs  Lab 08/29/24 1813 08/30/24 0535 08/31/24 0342  WBC 7.5 9.2 10.1  HGB 14.5 14.2 13.5  HCT 42.5 41.3 39.9  MCV 85.2 84.8 84.4  PLT 183 163 168   Basic Metabolic Panel: Recent Labs  Lab 08/29/24 1813 08/30/24 0535 08/31/24 0342  NA 137 138 137  K 4.0 3.9 3.8  CL 103 105 105  CO2 25 23 22   GLUCOSE 244* 188* 163*  BUN 16 14 16   CREATININE  1.24 1.05 1.20  CALCIUM  8.6* 8.5* 8.6*  MG  --  2.1 1.6*  PHOS  --  2.6  --    GFR: Estimated Creatinine Clearance: 63.7 mL/min (by C-G formula based on SCr of 1.2 mg/dL).   Scheduled Meds:  aspirin  EC  81 mg Oral Daily   atorvastatin   40 mg Oral Daily   carvedilol   3.125 mg Oral BID WC   insulin  aspart  0-20 Units Subcutaneous TID WC   insulin  aspart  0-5 Units Subcutaneous QHS   isosorbide  mononitrate  30 mg Oral Daily   sodium chloride  flush   3 mL Intravenous Q12H   Continuous Infusions:  sodium chloride      heparin  1,500 Units/hr (08/31/24 0438)     LOS: 2 days   Reyes IVAR Moores, MD [facility] Office  [phone] Pager - Text Page per Tracey  If 7PM-7AM, please contact night-coverage per Amion 08/31/2024, 10:51 AM

## 2024-09-01 ENCOUNTER — Encounter (HOSPITAL_COMMUNITY): Payer: Self-pay | Admitting: Anesthesiology

## 2024-09-01 ENCOUNTER — Encounter (HOSPITAL_COMMUNITY): Payer: Self-pay | Admitting: Internal Medicine

## 2024-09-01 ENCOUNTER — Inpatient Hospital Stay (HOSPITAL_COMMUNITY)

## 2024-09-01 ENCOUNTER — Inpatient Hospital Stay (HOSPITAL_COMMUNITY): Admission: EM | Disposition: A | Discharge status: Home / Self Care | Payer: Self-pay | Source: Home / Self Care

## 2024-09-01 ENCOUNTER — Inpatient Hospital Stay (HOSPITAL_COMMUNITY): Payer: Self-pay | Admitting: Anesthesiology

## 2024-09-01 DIAGNOSIS — I2510 Atherosclerotic heart disease of native coronary artery without angina pectoris: Secondary | ICD-10-CM

## 2024-09-01 DIAGNOSIS — N183 Chronic kidney disease, stage 3 unspecified: Secondary | ICD-10-CM

## 2024-09-01 DIAGNOSIS — Z951 Presence of aortocoronary bypass graft: Secondary | ICD-10-CM

## 2024-09-01 DIAGNOSIS — E1122 Type 2 diabetes mellitus with diabetic chronic kidney disease: Secondary | ICD-10-CM

## 2024-09-01 DIAGNOSIS — I214 Non-ST elevation (NSTEMI) myocardial infarction: Secondary | ICD-10-CM

## 2024-09-01 DIAGNOSIS — I129 Hypertensive chronic kidney disease with stage 1 through stage 4 chronic kidney disease, or unspecified chronic kidney disease: Secondary | ICD-10-CM

## 2024-09-01 HISTORY — PX: INTRAOPERATIVE TRANSESOPHAGEAL ECHOCARDIOGRAM: SHX5062

## 2024-09-01 HISTORY — PX: CORONARY ARTERY BYPASS GRAFT: SHX141

## 2024-09-01 HISTORY — DX: Presence of aortocoronary bypass graft: Z95.1

## 2024-09-01 LAB — POCT I-STAT 7, (LYTES, BLD GAS, ICA,H+H)
Acid-base deficit: 4 mmol/L — ABNORMAL HIGH (ref 0.0–2.0)
Acid-base deficit: 4 mmol/L — ABNORMAL HIGH (ref 0.0–2.0)
Acid-base deficit: 3 mmol/L — ABNORMAL HIGH (ref 0.0–2.0)
Acid-base deficit: 2 mmol/L (ref 0.0–2.0)
Acid-base deficit: 3 mmol/L — ABNORMAL HIGH (ref 0.0–2.0)
Acid-base deficit: 4 mmol/L — ABNORMAL HIGH (ref 0.0–2.0)
Acid-base deficit: 8 mmol/L — ABNORMAL HIGH (ref 0.0–2.0)
Acid-base deficit: 8 mmol/L — ABNORMAL HIGH (ref 0.0–2.0)
Acid-base deficit: 3 mmol/L — ABNORMAL HIGH (ref 0.0–2.0)
Acid-base deficit: 4 mmol/L — ABNORMAL HIGH (ref 0.0–2.0)
Bicarbonate: 21.3 mmol/L (ref 20.0–28.0)
Bicarbonate: 21.6 mmol/L (ref 20.0–28.0)
Bicarbonate: 22.3 mmol/L (ref 20.0–28.0)
Bicarbonate: 23 mmol/L (ref 20.0–28.0)
Bicarbonate: 22.8 mmol/L (ref 20.0–28.0)
Bicarbonate: 21.6 mmol/L (ref 20.0–28.0)
Bicarbonate: 17 mmol/L — ABNORMAL LOW (ref 20.0–28.0)
Bicarbonate: 17.6 mmol/L — ABNORMAL LOW (ref 20.0–28.0)
Bicarbonate: 20.2 mmol/L (ref 20.0–28.0)
Bicarbonate: 20.3 mmol/L (ref 20.0–28.0)
Calcium, Ion: 1.2 mmol/L (ref 1.15–1.40)
Calcium, Ion: 0.89 mmol/L — CL (ref 1.15–1.40)
Calcium, Ion: 1.08 mmol/L — ABNORMAL LOW (ref 1.15–1.40)
Calcium, Ion: 1.08 mmol/L — ABNORMAL LOW (ref 1.15–1.40)
Calcium, Ion: 1.03 mmol/L — ABNORMAL LOW (ref 1.15–1.40)
Calcium, Ion: 1.07 mmol/L — ABNORMAL LOW (ref 1.15–1.40)
Calcium, Ion: 0.93 mmol/L — ABNORMAL LOW (ref 1.15–1.40)
Calcium, Ion: 0.94 mmol/L — ABNORMAL LOW (ref 1.15–1.40)
Calcium, Ion: 0.91 mmol/L — ABNORMAL LOW (ref 1.15–1.40)
Calcium, Ion: 0.96 mmol/L — ABNORMAL LOW (ref 1.15–1.40)
HCT: 36 % — ABNORMAL LOW (ref 39.0–52.0)
HCT: 26 % — ABNORMAL LOW (ref 39.0–52.0)
HCT: 29 % — ABNORMAL LOW (ref 39.0–52.0)
HCT: 28 % — ABNORMAL LOW (ref 39.0–52.0)
HCT: 27 % — ABNORMAL LOW (ref 39.0–52.0)
HCT: 28 % — ABNORMAL LOW (ref 39.0–52.0)
HCT: 26 % — ABNORMAL LOW (ref 39.0–52.0)
HCT: 25 % — ABNORMAL LOW (ref 39.0–52.0)
HCT: 28 % — ABNORMAL LOW (ref 39.0–52.0)
HCT: 27 % — ABNORMAL LOW (ref 39.0–52.0)
Hemoglobin: 12.2 g/dL — ABNORMAL LOW (ref 13.0–17.0)
Hemoglobin: 8.8 g/dL — ABNORMAL LOW (ref 13.0–17.0)
Hemoglobin: 9.9 g/dL — ABNORMAL LOW (ref 13.0–17.0)
Hemoglobin: 9.5 g/dL — ABNORMAL LOW (ref 13.0–17.0)
Hemoglobin: 9.2 g/dL — ABNORMAL LOW (ref 13.0–17.0)
Hemoglobin: 9.5 g/dL — ABNORMAL LOW (ref 13.0–17.0)
Hemoglobin: 8.8 g/dL — ABNORMAL LOW (ref 13.0–17.0)
Hemoglobin: 8.5 g/dL — ABNORMAL LOW (ref 13.0–17.0)
Hemoglobin: 9.5 g/dL — ABNORMAL LOW (ref 13.0–17.0)
Hemoglobin: 9.2 g/dL — ABNORMAL LOW (ref 13.0–17.0)
O2 Saturation: 100 %
O2 Saturation: 100 %
O2 Saturation: 100 %
O2 Saturation: 100 %
O2 Saturation: 100 %
O2 Saturation: 94 %
O2 Saturation: 92 %
O2 Saturation: 96 %
O2 Saturation: 87 %
O2 Saturation: 97 %
Patient temperature: 96.7
Patient temperature: 99.4
Patient temperature: 99.6
Patient temperature: 97.5
Patient temperature: 98
Potassium: 3.9 mmol/L (ref 3.5–5.1)
Potassium: 4.4 mmol/L (ref 3.5–5.1)
Potassium: 4.7 mmol/L (ref 3.5–5.1)
Potassium: 4.9 mmol/L (ref 3.5–5.1)
Potassium: 4.4 mmol/L (ref 3.5–5.1)
Potassium: 4.3 mmol/L (ref 3.5–5.1)
Potassium: 3.7 mmol/L (ref 3.5–5.1)
Potassium: 3.9 mmol/L (ref 3.5–5.1)
Potassium: 3.6 mmol/L (ref 3.5–5.1)
Potassium: 3.9 mmol/L (ref 3.5–5.1)
Sodium: 137 mmol/L (ref 135–145)
Sodium: 138 mmol/L (ref 135–145)
Sodium: 137 mmol/L (ref 135–145)
Sodium: 136 mmol/L (ref 135–145)
Sodium: 138 mmol/L (ref 135–145)
Sodium: 138 mmol/L (ref 135–145)
Sodium: 140 mmol/L (ref 135–145)
Sodium: 142 mmol/L (ref 135–145)
Sodium: 143 mmol/L (ref 135–145)
Sodium: 140 mmol/L (ref 135–145)
TCO2: 22 mmol/L (ref 22–32)
TCO2: 23 mmol/L (ref 22–32)
TCO2: 23 mmol/L (ref 22–32)
TCO2: 24 mmol/L (ref 22–32)
TCO2: 24 mmol/L (ref 22–32)
TCO2: 23 mmol/L (ref 22–32)
TCO2: 18 mmol/L — ABNORMAL LOW (ref 22–32)
TCO2: 19 mmol/L — ABNORMAL LOW (ref 22–32)
TCO2: 21 mmol/L — ABNORMAL LOW (ref 22–32)
TCO2: 21 mmol/L — ABNORMAL LOW (ref 22–32)
pCO2 arterial: 37.7 mmHg (ref 32–48)
pCO2 arterial: 40 mmHg (ref 32–48)
pCO2 arterial: 37.7 mmHg (ref 32–48)
pCO2 arterial: 40.3 mmHg (ref 32–48)
pCO2 arterial: 43.3 mmHg (ref 32–48)
pCO2 arterial: 36.5 mmHg (ref 32–48)
pCO2 arterial: 31.7 mmHg — ABNORMAL LOW (ref 32–48)
pCO2 arterial: 35.5 mmHg (ref 32–48)
pCO2 arterial: 29.1 mmHg — ABNORMAL LOW (ref 32–48)
pCO2 arterial: 32.3 mmHg (ref 32–48)
pH, Arterial: 7.361 (ref 7.35–7.45)
pH, Arterial: 7.341 — ABNORMAL LOW (ref 7.35–7.45)
pH, Arterial: 7.379 (ref 7.35–7.45)
pH, Arterial: 7.365 (ref 7.35–7.45)
pH, Arterial: 7.329 — ABNORMAL LOW (ref 7.35–7.45)
pH, Arterial: 7.375 (ref 7.35–7.45)
pH, Arterial: 7.339 — ABNORMAL LOW (ref 7.35–7.45)
pH, Arterial: 7.307 — ABNORMAL LOW (ref 7.35–7.45)
pH, Arterial: 7.446 (ref 7.35–7.45)
pH, Arterial: 7.406 (ref 7.35–7.45)
pO2, Arterial: 440 mmHg — ABNORMAL HIGH (ref 83–108)
pO2, Arterial: 374 mmHg — ABNORMAL HIGH (ref 83–108)
pO2, Arterial: 388 mmHg — ABNORMAL HIGH (ref 83–108)
pO2, Arterial: 410 mmHg — ABNORMAL HIGH (ref 83–108)
pO2, Arterial: 325 mmHg — ABNORMAL HIGH (ref 83–108)
pO2, Arterial: 68 mmHg — ABNORMAL LOW (ref 83–108)
pO2, Arterial: 69 mmHg — ABNORMAL LOW (ref 83–108)
pO2, Arterial: 92 mmHg (ref 83–108)
pO2, Arterial: 49 mmHg — ABNORMAL LOW (ref 83–108)
pO2, Arterial: 87 mmHg (ref 83–108)

## 2024-09-01 LAB — POCT I-STAT, CHEM 8
BUN: 16 mg/dL (ref 8–23)
BUN: 17 mg/dL (ref 8–23)
BUN: 15 mg/dL (ref 8–23)
BUN: 15 mg/dL (ref 8–23)
BUN: 15 mg/dL (ref 8–23)
BUN: 16 mg/dL (ref 8–23)
Calcium, Ion: 1.2 mmol/L (ref 1.15–1.40)
Calcium, Ion: 1.18 mmol/L (ref 1.15–1.40)
Calcium, Ion: 1.06 mmol/L — ABNORMAL LOW (ref 1.15–1.40)
Calcium, Ion: 1.09 mmol/L — ABNORMAL LOW (ref 1.15–1.40)
Calcium, Ion: 1.05 mmol/L — ABNORMAL LOW (ref 1.15–1.40)
Calcium, Ion: 1.05 mmol/L — ABNORMAL LOW (ref 1.15–1.40)
Chloride: 102 mmol/L (ref 98–111)
Chloride: 103 mmol/L (ref 98–111)
Chloride: 103 mmol/L (ref 98–111)
Chloride: 104 mmol/L (ref 98–111)
Chloride: 103 mmol/L (ref 98–111)
Chloride: 104 mmol/L (ref 98–111)
Creatinine, Ser: 1.1 mg/dL (ref 0.61–1.24)
Creatinine, Ser: 1 mg/dL (ref 0.61–1.24)
Creatinine, Ser: 1 mg/dL (ref 0.61–1.24)
Creatinine, Ser: 1 mg/dL (ref 0.61–1.24)
Creatinine, Ser: 1 mg/dL (ref 0.61–1.24)
Creatinine, Ser: 1.1 mg/dL (ref 0.61–1.24)
Glucose, Bld: 151 mg/dL — ABNORMAL HIGH (ref 70–99)
Glucose, Bld: 141 mg/dL — ABNORMAL HIGH (ref 70–99)
Glucose, Bld: 124 mg/dL — ABNORMAL HIGH (ref 70–99)
Glucose, Bld: 134 mg/dL — ABNORMAL HIGH (ref 70–99)
Glucose, Bld: 149 mg/dL — ABNORMAL HIGH (ref 70–99)
Glucose, Bld: 160 mg/dL — ABNORMAL HIGH (ref 70–99)
HCT: 34 % — ABNORMAL LOW (ref 39.0–52.0)
HCT: 37 % — ABNORMAL LOW (ref 39.0–52.0)
HCT: 28 % — ABNORMAL LOW (ref 39.0–52.0)
HCT: 28 % — ABNORMAL LOW (ref 39.0–52.0)
HCT: 26 % — ABNORMAL LOW (ref 39.0–52.0)
HCT: 27 % — ABNORMAL LOW (ref 39.0–52.0)
Hemoglobin: 11.6 g/dL — ABNORMAL LOW (ref 13.0–17.0)
Hemoglobin: 12.6 g/dL — ABNORMAL LOW (ref 13.0–17.0)
Hemoglobin: 9.5 g/dL — ABNORMAL LOW (ref 13.0–17.0)
Hemoglobin: 9.5 g/dL — ABNORMAL LOW (ref 13.0–17.0)
Hemoglobin: 8.8 g/dL — ABNORMAL LOW (ref 13.0–17.0)
Hemoglobin: 9.2 g/dL — ABNORMAL LOW (ref 13.0–17.0)
Potassium: 3.9 mmol/L (ref 3.5–5.1)
Potassium: 4.3 mmol/L (ref 3.5–5.1)
Potassium: 5.2 mmol/L — ABNORMAL HIGH (ref 3.5–5.1)
Potassium: 4.7 mmol/L (ref 3.5–5.1)
Potassium: 4.5 mmol/L (ref 3.5–5.1)
Potassium: 4.9 mmol/L (ref 3.5–5.1)
Sodium: 138 mmol/L (ref 135–145)
Sodium: 139 mmol/L (ref 135–145)
Sodium: 135 mmol/L (ref 135–145)
Sodium: 135 mmol/L (ref 135–145)
Sodium: 136 mmol/L (ref 135–145)
Sodium: 138 mmol/L (ref 135–145)
TCO2: 26 mmol/L (ref 22–32)
TCO2: 25 mmol/L (ref 22–32)
TCO2: 26 mmol/L (ref 22–32)
TCO2: 25 mmol/L (ref 22–32)
TCO2: 25 mmol/L (ref 22–32)
TCO2: 28 mmol/L (ref 22–32)

## 2024-09-01 LAB — CBC
HCT: 38.9 % — ABNORMAL LOW (ref 39.0–52.0)
HCT: 31 % — ABNORMAL LOW (ref 39.0–52.0)
HCT: 33.1 % — ABNORMAL LOW (ref 39.0–52.0)
Hemoglobin: 13.2 g/dL (ref 13.0–17.0)
Hemoglobin: 10.7 g/dL — ABNORMAL LOW (ref 13.0–17.0)
Hemoglobin: 11.3 g/dL — ABNORMAL LOW (ref 13.0–17.0)
MCH: 28.6 pg (ref 26.0–34.0)
MCH: 29.2 pg (ref 26.0–34.0)
MCH: 29 pg (ref 26.0–34.0)
MCHC: 33.9 g/dL (ref 30.0–36.0)
MCHC: 34.5 g/dL (ref 30.0–36.0)
MCHC: 34.1 g/dL (ref 30.0–36.0)
MCV: 84.4 fL (ref 80.0–100.0)
MCV: 84.7 fL (ref 80.0–100.0)
MCV: 84.9 fL (ref 80.0–100.0)
Platelets: 158 K/uL (ref 150–400)
Platelets: 128 K/uL — ABNORMAL LOW (ref 150–400)
Platelets: 162 K/uL (ref 150–400)
RBC: 4.61 MIL/uL (ref 4.22–5.81)
RBC: 3.66 MIL/uL — ABNORMAL LOW (ref 4.22–5.81)
RBC: 3.9 MIL/uL — ABNORMAL LOW (ref 4.22–5.81)
RDW: 13 % (ref 11.5–15.5)
RDW: 12.8 % (ref 11.5–15.5)
RDW: 13.1 % (ref 11.5–15.5)
WBC: 9.7 K/uL (ref 4.0–10.5)
WBC: 10.6 K/uL — ABNORMAL HIGH (ref 4.0–10.5)
WBC: 12.4 K/uL — ABNORMAL HIGH (ref 4.0–10.5)
nRBC: 0 % (ref 0.0–0.2)
nRBC: 0 % (ref 0.0–0.2)
nRBC: 0 % (ref 0.0–0.2)

## 2024-09-01 LAB — URINALYSIS, ROUTINE W REFLEX MICROSCOPIC
Bilirubin Urine: NEGATIVE
Glucose, UA: NEGATIVE mg/dL
Hgb urine dipstick: NEGATIVE
Ketones, ur: NEGATIVE mg/dL
Leukocytes,Ua: NEGATIVE
Nitrite: NEGATIVE
Protein, ur: NEGATIVE mg/dL
Specific Gravity, Urine: 1.013 (ref 1.005–1.030)
pH: 5 (ref 5.0–8.0)

## 2024-09-01 LAB — POCT I-STAT EG7
Acid-base deficit: 2 mmol/L (ref 0.0–2.0)
Bicarbonate: 23.5 mmol/L (ref 20.0–28.0)
Calcium, Ion: 1.02 mmol/L — ABNORMAL LOW (ref 1.15–1.40)
HCT: 27 % — ABNORMAL LOW (ref 39.0–52.0)
Hemoglobin: 9.2 g/dL — ABNORMAL LOW (ref 13.0–17.0)
O2 Saturation: 78 %
Potassium: 3.7 mmol/L (ref 3.5–5.1)
Sodium: 137 mmol/L (ref 135–145)
TCO2: 25 mmol/L (ref 22–32)
pCO2, Ven: 42.5 mmHg — ABNORMAL LOW (ref 44–60)
pH, Ven: 7.35 (ref 7.25–7.43)
pO2, Ven: 45 mmHg (ref 32–45)

## 2024-09-01 LAB — BASIC METABOLIC PANEL WITH GFR
Anion gap: 10 (ref 5–15)
BUN: 15 mg/dL (ref 8–23)
CO2: 22 mmol/L (ref 22–32)
Calcium: 6.8 mg/dL — ABNORMAL LOW (ref 8.9–10.3)
Chloride: 106 mmol/L (ref 98–111)
Creatinine, Ser: 1.18 mg/dL (ref 0.61–1.24)
GFR, Estimated: >60 mL/min (ref >60)
Glucose, Bld: 121 mg/dL — ABNORMAL HIGH (ref 70–99)
Potassium: 4.2 mmol/L (ref 3.5–5.1)
Sodium: 138 mmol/L (ref 135–145)

## 2024-09-01 LAB — GLUCOSE, CAPILLARY
Glucose-Capillary: 154 mg/dL — ABNORMAL HIGH (ref 70–99)
Glucose-Capillary: 127 mg/dL — ABNORMAL HIGH (ref 70–99)
Glucose-Capillary: 118 mg/dL — ABNORMAL HIGH (ref 70–99)
Glucose-Capillary: 115 mg/dL — ABNORMAL HIGH (ref 70–99)
Glucose-Capillary: 103 mg/dL — ABNORMAL HIGH (ref 70–99)
Glucose-Capillary: 105 mg/dL — ABNORMAL HIGH (ref 70–99)
Glucose-Capillary: 135 mg/dL — ABNORMAL HIGH (ref 70–99)

## 2024-09-01 LAB — COMPREHENSIVE METABOLIC PANEL WITH GFR
ALT: 26 U/L (ref 0–44)
AST: 23 U/L (ref 15–41)
Albumin: 3.2 g/dL — ABNORMAL LOW (ref 3.5–5.0)
Alkaline Phosphatase: 55 U/L (ref 38–126)
Anion gap: 10 (ref 5–15)
BUN: 17 mg/dL (ref 8–23)
CO2: 20 mmol/L — ABNORMAL LOW (ref 22–32)
Calcium: 8.5 mg/dL — ABNORMAL LOW (ref 8.9–10.3)
Chloride: 104 mmol/L (ref 98–111)
Creatinine, Ser: 1.21 mg/dL (ref 0.61–1.24)
GFR, Estimated: >60 mL/min (ref >60)
Glucose, Bld: 153 mg/dL — ABNORMAL HIGH (ref 70–99)
Potassium: 3.8 mmol/L (ref 3.5–5.1)
Sodium: 134 mmol/L — ABNORMAL LOW (ref 135–145)
Total Bilirubin: 1.2 mg/dL (ref 0.0–1.2)
Total Protein: 6.1 g/dL — ABNORMAL LOW (ref 6.5–8.1)

## 2024-09-01 LAB — FIBRINOGEN
Fibrinogen: 426 mg/dL (ref 210–475)
Fibrinogen: 400 mg/dL (ref 210–475)

## 2024-09-01 LAB — PROTIME-INR
INR: 1.4 — ABNORMAL HIGH (ref 0.8–1.2)
Prothrombin Time: 18 s — ABNORMAL HIGH (ref 11.4–15.2)

## 2024-09-01 LAB — SURGICAL PCR SCREEN
MRSA, PCR: NEGATIVE
Staphylococcus aureus: NEGATIVE

## 2024-09-01 LAB — CG4 I-STAT (LACTIC ACID): Lactic Acid, Venous: 1.3 mmol/L (ref 0.5–1.9)

## 2024-09-01 LAB — HEMOGLOBIN AND HEMATOCRIT, BLOOD
HCT: 29.1 % — ABNORMAL LOW (ref 39.0–52.0)
Hemoglobin: 10 g/dL — ABNORMAL LOW (ref 13.0–17.0)

## 2024-09-01 LAB — LIPOPROTEIN A (LPA): Lipoprotein (a): <8.4 nmol/L (ref <75.0)

## 2024-09-01 LAB — MAGNESIUM
Magnesium: 2.2 mg/dL (ref 1.7–2.4)
Magnesium: 3.5 mg/dL — ABNORMAL HIGH (ref 1.7–2.4)

## 2024-09-01 LAB — PLATELET COUNT: Platelets: 150 K/uL (ref 150–400)

## 2024-09-01 LAB — APTT: aPTT: 39 s — ABNORMAL HIGH (ref 24–36)

## 2024-09-01 LAB — HEPARIN LEVEL (UNFRACTIONATED): Heparin Unfractionated: 0.52 [IU]/mL (ref 0.30–0.70)

## 2024-09-01 SURGERY — CORONARY ARTERY BYPASS GRAFTING (CABG)
Anesthesia: General | Site: Chest

## 2024-09-01 MED ORDER — PLASMA-LYTE A IV SOLN: INTRAVENOUS | Status: DC | PRN

## 2024-09-01 MED ORDER — CHLORHEXIDINE GLUCONATE CLOTH 2 % EX PADS
6.0000 | MEDICATED_PAD | Freq: Every day | CUTANEOUS | Status: DC
  Administered 2024-09-01 – 2024-09-05 (×5): 6 via TOPICAL

## 2024-09-01 MED ORDER — SODIUM CHLORIDE 0.9% FLUSH
10.0000 mL | Freq: Two times a day (BID) | INTRAVENOUS | Status: DC
  Administered 2024-09-01 – 2024-09-02 (×2): 10 mL
  Administered 2024-09-02: 30 mL
  Administered 2024-09-03 – 2024-09-05 (×3): 10 mL

## 2024-09-01 MED ORDER — MIDAZOLAM HCL (PF) 5 MG/ML IJ SOLN
INTRAMUSCULAR | Status: DC | PRN
  Administered 2024-09-01: 2 mg via INTRAVENOUS

## 2024-09-01 MED ORDER — ONDANSETRON HCL 4 MG/2ML IJ SOLN
4.0000 mg | Freq: Four times a day (QID) | INTRAMUSCULAR | Status: DC | PRN
  Administered 2024-09-01: 4 mg via INTRAVENOUS
  Filled 2024-09-01: qty 2

## 2024-09-01 MED ORDER — ALBUMIN HUMAN 5 % IV SOLN
250.0000 mL | INTRAVENOUS | Status: DC | PRN
  Administered 2024-09-01 (×2): 12.5 g via INTRAVENOUS

## 2024-09-01 MED ORDER — ALBUMIN HUMAN 5 % IV SOLN: INTRAVENOUS | Status: DC | PRN

## 2024-09-01 MED ORDER — ASPIRIN 81 MG PO CHEW
324.0000 mg | CHEWABLE_TABLET | Freq: Once | ORAL | Status: AC
  Administered 2024-09-01: 324 mg via ORAL
  Filled 2024-09-01: qty 4

## 2024-09-01 MED ORDER — HEMOSTATIC AGENTS (NO CHARGE) OPTIME
TOPICAL | Status: DC | PRN
  Administered 2024-09-01: 1 via TOPICAL

## 2024-09-01 MED ORDER — MIDAZOLAM HCL (PF) 2 MG/2ML IJ SOLN
2.0000 mg | INTRAMUSCULAR | Status: DC | PRN
  Administered 2024-09-01 (×2): 2 mg via INTRAVENOUS
  Filled 2024-09-01 (×2): qty 2

## 2024-09-01 MED ORDER — METOPROLOL TARTRATE 5 MG/5ML IV SOLN: 2.5000 mg | INTRAVENOUS | Status: DC | PRN

## 2024-09-01 MED ORDER — DEXMEDETOMIDINE HCL IN NACL 400 MCG/100ML IV SOLN
0.0000 ug/kg/h | INTRAVENOUS | Status: DC
  Administered 2024-09-01: 0.7 ug/kg/h via INTRAVENOUS
  Filled 2024-09-01: qty 100

## 2024-09-01 MED ORDER — NICARDIPINE HCL IN NACL 20-0.86 MG/200ML-% IV SOLN
3.0000 mg/h | INTRAVENOUS | Status: DC
  Administered 2024-09-01: 5 mg/h via INTRAVENOUS
  Filled 2024-09-01: qty 200

## 2024-09-01 MED ORDER — VASOPRESSIN 20 UNIT/ML IV SOLN
INTRAVENOUS | Status: AC
  Filled 2024-09-01: qty 1

## 2024-09-01 MED ORDER — PHENYLEPHRINE 80 MCG/ML (10ML) SYRINGE FOR IV PUSH (FOR BLOOD PRESSURE SUPPORT)
PREFILLED_SYRINGE | INTRAVENOUS | Status: DC | PRN
Start: 2024-09-01 — End: 2024-09-01
  Administered 2024-09-01 (×2): 80 ug via INTRAVENOUS

## 2024-09-01 MED ORDER — FENTANYL CITRATE (PF) 250 MCG/5ML IJ SOLN
INTRAMUSCULAR | Status: AC
  Filled 2024-09-01: qty 5

## 2024-09-01 MED ORDER — CLEVIDIPINE BUTYRATE 0.5 MG/ML IV EMUL
0.0000 mg/h | INTRAVENOUS | Status: DC
  Administered 2024-09-01: 2 mg/h via INTRAVENOUS

## 2024-09-01 MED ORDER — TRAMADOL HCL 50 MG PO TABS
50.0000 mg | ORAL_TABLET | ORAL | Status: DC | PRN
  Administered 2024-09-01 – 2024-09-03 (×3): 100 mg via ORAL
  Filled 2024-09-01 (×3): qty 2

## 2024-09-01 MED ORDER — SODIUM BICARBONATE 8.4 % IV SOLN
50.0000 meq | Freq: Once | INTRAVENOUS | Status: AC
  Administered 2024-09-01: 50 meq via INTRAVENOUS

## 2024-09-01 MED ORDER — BISACODYL 10 MG RE SUPP: 10.0000 mg | Freq: Every day | RECTAL | Status: DC

## 2024-09-01 MED ORDER — THROMBIN 20000 UNITS EX SOLR: OROMUCOSAL | Status: DC | PRN

## 2024-09-01 MED ORDER — NOREPINEPHRINE 4 MG/250ML-% IV SOLN
INTRAVENOUS | Status: DC | PRN
  Administered 2024-09-01: 2 ug/min via INTRAVENOUS

## 2024-09-01 MED ORDER — ROCURONIUM BROMIDE 10 MG/ML (PF) SYRINGE
PREFILLED_SYRINGE | INTRAVENOUS | Status: DC | PRN
  Administered 2024-09-01: 20 mg via INTRAVENOUS
  Administered 2024-09-01: 30 mg via INTRAVENOUS
  Administered 2024-09-01: 80 mg via INTRAVENOUS
  Administered 2024-09-01: 50 mg via INTRAVENOUS

## 2024-09-01 MED ORDER — LACTATED RINGERS IV SOLN: INTRAVENOUS | Status: AC

## 2024-09-01 MED ORDER — SUGAMMADEX SODIUM 200 MG/2ML IV SOLN
INTRAVENOUS | Status: DC | PRN
  Administered 2024-09-01: 200 mg via INTRAVENOUS

## 2024-09-01 MED ORDER — CLEVIDIPINE BUTYRATE 0.5 MG/ML IV EMUL
INTRAVENOUS | Status: AC
Start: 2024-09-01 — End: 2024-09-01
  Filled 2024-09-01: qty 100

## 2024-09-01 MED ORDER — POTASSIUM CHLORIDE 10 MEQ/50ML IV SOLN
10.0000 meq | INTRAVENOUS | Status: AC
  Filled 2024-09-01: qty 50

## 2024-09-01 MED ORDER — INSULIN REGULAR(HUMAN) IN NACL 100-0.9 UT/100ML-% IV SOLN: INTRAVENOUS | Status: DC

## 2024-09-01 MED ORDER — ORAL CARE MOUTH RINSE
15.0000 mL | OROMUCOSAL | Status: DC
  Administered 2024-09-01 (×2): 15 mL via OROMUCOSAL

## 2024-09-01 MED ORDER — LACTATED RINGERS IV SOLN: INTRAVENOUS | Status: DC | PRN

## 2024-09-01 MED ORDER — PROPOFOL 10 MG/ML IV BOLUS
INTRAVENOUS | Status: DC | PRN
  Administered 2024-09-01: 30 mg via INTRAVENOUS
  Administered 2024-09-01: 50 mg via INTRAVENOUS

## 2024-09-01 MED ORDER — SODIUM CHLORIDE 0.9% FLUSH: 3.0000 mL | INTRAVENOUS | Status: DC | PRN

## 2024-09-01 MED ORDER — SODIUM CHLORIDE 0.9% FLUSH
3.0000 mL | Freq: Two times a day (BID) | INTRAVENOUS | Status: DC
  Administered 2024-09-02 (×2): 3 mL via INTRAVENOUS

## 2024-09-01 MED ORDER — SODIUM CHLORIDE 0.45 % IV SOLN: INTRAVENOUS | Status: AC | PRN

## 2024-09-01 MED ORDER — BISACODYL 5 MG PO TBEC
10.0000 mg | DELAYED_RELEASE_TABLET | Freq: Every day | ORAL | Status: DC
  Administered 2024-09-02 – 2024-09-04 (×3): 10 mg via ORAL
  Filled 2024-09-01 (×4): qty 2

## 2024-09-01 MED ORDER — PANTOPRAZOLE SODIUM 40 MG IV SOLR
40.0000 mg | Freq: Every day | INTRAVENOUS | Status: AC
  Administered 2024-09-01 – 2024-09-02 (×2): 40 mg via INTRAVENOUS
  Filled 2024-09-01 (×2): qty 10

## 2024-09-01 MED ORDER — SODIUM CHLORIDE 0.9 % IV SOLN: INTRAVENOUS | Status: AC

## 2024-09-01 MED ORDER — FENTANYL CITRATE (PF) 250 MCG/5ML IJ SOLN
INTRAMUSCULAR | Status: DC | PRN
  Administered 2024-09-01: 150 ug via INTRAVENOUS
  Administered 2024-09-01: 100 ug via INTRAVENOUS
  Administered 2024-09-01: 300 ug via INTRAVENOUS
  Administered 2024-09-01: 100 ug via INTRAVENOUS
  Administered 2024-09-01: 50 ug via INTRAVENOUS

## 2024-09-01 MED ORDER — HEPARIN SODIUM (PORCINE) 1000 UNIT/ML IJ SOLN
INTRAMUSCULAR | Status: DC | PRN
  Administered 2024-09-01: 36000 [IU] via INTRAVENOUS

## 2024-09-01 MED ORDER — PANTOPRAZOLE SODIUM 40 MG PO TBEC
40.0000 mg | DELAYED_RELEASE_TABLET | Freq: Every day | ORAL | Status: DC
  Administered 2024-09-03 – 2024-09-05 (×3): 40 mg via ORAL
  Filled 2024-09-01 (×3): qty 1

## 2024-09-01 MED ORDER — ASPIRIN 325 MG PO TBEC
325.0000 mg | DELAYED_RELEASE_TABLET | Freq: Every day | ORAL | Status: DC
  Administered 2024-09-02 – 2024-09-05 (×4): 325 mg via ORAL
  Filled 2024-09-01 (×4): qty 1

## 2024-09-01 MED ORDER — SODIUM CHLORIDE 0.9 % IV SOLN: INTRAVENOUS | Status: DC | PRN

## 2024-09-01 MED ORDER — METOCLOPRAMIDE HCL 5 MG/ML IJ SOLN
10.0000 mg | Freq: Four times a day (QID) | INTRAMUSCULAR | Status: AC
Start: 2024-09-01 — End: 2024-09-03
  Administered 2024-09-01 – 2024-09-02 (×6): 10 mg via INTRAVENOUS
  Filled 2024-09-01 (×6): qty 2

## 2024-09-01 MED ORDER — VANCOMYCIN HCL IN DEXTROSE 1-5 GM/200ML-% IV SOLN
1000.0000 mg | Freq: Once | INTRAVENOUS | Status: AC
  Administered 2024-09-01: 1000 mg via INTRAVENOUS
  Filled 2024-09-01: qty 200

## 2024-09-01 MED ORDER — MIDAZOLAM HCL (PF) 10 MG/2ML IJ SOLN
INTRAMUSCULAR | Status: AC
  Filled 2024-09-01: qty 2

## 2024-09-01 MED ORDER — ASPIRIN 81 MG PO CHEW: 324.0000 mg | CHEWABLE_TABLET | Freq: Every day | ORAL | Status: DC

## 2024-09-01 MED ORDER — CEFAZOLIN SODIUM-DEXTROSE 2-4 GM/100ML-% IV SOLN
2.0000 g | Freq: Three times a day (TID) | INTRAVENOUS | Status: AC
  Administered 2024-09-01 – 2024-09-03 (×6): 2 g via INTRAVENOUS
  Filled 2024-09-01 (×6): qty 100

## 2024-09-01 MED ORDER — MORPHINE SULFATE (PF) 2 MG/ML IV SOLN
1.0000 mg | INTRAVENOUS | Status: DC | PRN
  Administered 2024-09-01 – 2024-09-02 (×6): 2 mg via INTRAVENOUS
  Filled 2024-09-01 (×7): qty 1

## 2024-09-01 MED ORDER — METOPROLOL TARTRATE 12.5 MG HALF TABLET
12.5000 mg | ORAL_TABLET | Freq: Two times a day (BID) | ORAL | Status: DC
  Administered 2024-09-02 – 2024-09-03 (×4): 12.5 mg via ORAL
  Filled 2024-09-01 (×4): qty 1

## 2024-09-01 MED ORDER — METOPROLOL TARTRATE 25 MG/10 ML ORAL SUSPENSION: 12.5000 mg | Freq: Two times a day (BID) | ORAL | Status: DC

## 2024-09-01 MED ORDER — DEXTROSE 50 % IV SOLN: 0.0000 mL | INTRAVENOUS | Status: DC | PRN

## 2024-09-01 MED ORDER — THROMBIN (RECOMBINANT) 20000 UNITS EX SOLR
CUTANEOUS | Status: AC
  Filled 2024-09-01: qty 20000

## 2024-09-01 MED ORDER — SODIUM CHLORIDE 0.9 % IV SOLN: 250.0000 mL | INTRAVENOUS | Status: AC

## 2024-09-01 MED ORDER — ACETAMINOPHEN 160 MG/5ML PO SOLN: 1000.0000 mg | Freq: Four times a day (QID) | ORAL | Status: DC

## 2024-09-01 MED ORDER — PROTAMINE SULFATE 10 MG/ML IV SOLN
INTRAVENOUS | Status: DC | PRN
  Administered 2024-09-01: 30 mg via INTRAVENOUS
  Administered 2024-09-01: 320 mg via INTRAVENOUS

## 2024-09-01 MED ORDER — NITROGLYCERIN IN D5W 200-5 MCG/ML-% IV SOLN
0.0000 ug/min | INTRAVENOUS | Status: DC
  Administered 2024-09-01: 10 ug/min via INTRAVENOUS

## 2024-09-01 MED ORDER — ACETAMINOPHEN 500 MG PO TABS
1000.0000 mg | ORAL_TABLET | Freq: Four times a day (QID) | ORAL | Status: DC
  Administered 2024-09-01 – 2024-09-05 (×15): 1000 mg via ORAL
  Filled 2024-09-01 (×15): qty 2

## 2024-09-01 MED ORDER — SODIUM CHLORIDE 0.9% IV SOLUTION: Freq: Once | INTRAVENOUS | Status: DC

## 2024-09-01 MED ORDER — OXYCODONE HCL 5 MG PO TABS
5.0000 mg | ORAL_TABLET | ORAL | Status: DC | PRN
  Administered 2024-09-01: 5 mg via ORAL
  Administered 2024-09-02 (×5): 10 mg via ORAL
  Filled 2024-09-01 (×2): qty 2
  Filled 2024-09-01: qty 1
  Filled 2024-09-01 (×3): qty 2

## 2024-09-01 MED ORDER — CHLORHEXIDINE GLUCONATE 0.12 % MT SOLN
15.0000 mL | OROMUCOSAL | Status: AC
  Administered 2024-09-01: 15 mL via OROMUCOSAL
  Filled 2024-09-01: qty 15

## 2024-09-01 MED ORDER — PROPOFOL 10 MG/ML IV BOLUS
INTRAVENOUS | Status: AC
Start: 2024-09-01 — End: 2024-09-01
  Filled 2024-09-01: qty 20

## 2024-09-01 MED ORDER — SODIUM CHLORIDE 0.9% FLUSH: 10.0000 mL | INTRAVENOUS | Status: DC | PRN

## 2024-09-01 MED ORDER — ORAL CARE MOUTH RINSE: 15.0000 mL | OROMUCOSAL | Status: DC | PRN

## 2024-09-01 MED ORDER — DOCUSATE SODIUM 100 MG PO CAPS
200.0000 mg | ORAL_CAPSULE | Freq: Every day | ORAL | Status: DC
  Administered 2024-09-02 – 2024-09-04 (×3): 200 mg via ORAL
  Filled 2024-09-01 (×4): qty 2

## 2024-09-01 MED ORDER — MAGNESIUM SULFATE 4 GM/100ML IV SOLN
4.0000 g | Freq: Once | INTRAVENOUS | Status: AC
  Administered 2024-09-01: 4 g via INTRAVENOUS
  Filled 2024-09-01: qty 100

## 2024-09-01 MED ORDER — ACETAMINOPHEN 160 MG/5ML PO SOLN
650.0000 mg | Freq: Once | ORAL | Status: AC
  Administered 2024-09-01: 650 mg
  Filled 2024-09-01: qty 20.3

## 2024-09-01 MED ORDER — 0.9 % SODIUM CHLORIDE (POUR BTL) OPTIME
TOPICAL | Status: DC | PRN
  Administered 2024-09-01: 5000 mL

## 2024-09-01 MED FILL — Potassium Chloride Inj 2 mEq/ML: INTRAVENOUS | Qty: 40 | Status: AC

## 2024-09-01 MED FILL — Lidocaine HCl Local Preservative Free (PF) Inj 2%: INTRAMUSCULAR | Qty: 14 | Status: AC

## 2024-09-01 MED FILL — Heparin Sodium (Porcine) Inj 1000 Unit/ML: Qty: 1000 | Status: AC

## 2024-09-01 SURGICAL SUPPLY — 82 items
ADAPTER MULTI PERFUSION 15 (ADAPTER) ×3 IMPLANT
BAG DECANTER FOR FLEXI CONT (MISCELLANEOUS) ×3 IMPLANT
BLADE CLIPPER SURG (BLADE) ×3 IMPLANT
BLADE STERNUM SYSTEM 6 (BLADE) ×3 IMPLANT
BNDG ELASTIC 4INX 5YD STR LF (GAUZE/BANDAGES/DRESSINGS) IMPLANT
BNDG ELASTIC 4X5.8 VLCR STR LF (GAUZE/BANDAGES/DRESSINGS) ×3 IMPLANT
BNDG ELASTIC 6INX 5YD STR LF (GAUZE/BANDAGES/DRESSINGS) ×3 IMPLANT
BNDG GAUZE DERMACEA FLUFF 4 (GAUZE/BANDAGES/DRESSINGS) ×3 IMPLANT
CANISTER SUCTION 3000ML PPV (SUCTIONS) ×3 IMPLANT
CANNULA AORTIC ROOT 9FR (CANNULA) ×3 IMPLANT
CANNULA GUNDRY RCSP 15FR (MISCELLANEOUS) IMPLANT
CANNULA MC2 2 STG 36/46 NON-V (CANNULA) IMPLANT
CANNULA NON VENT 20FR 12 (CANNULA) IMPLANT
CANNULA VESSEL 3MM BLUNT TIP (CANNULA) ×3 IMPLANT
CATH ROBINSON RED A/P 18FR (CATHETERS) ×6 IMPLANT
CATH THOR RT ANG 28F 9128 SOFT (CATHETERS) ×3 IMPLANT
CATH THORACIC 28FR (CATHETERS) ×3 IMPLANT
CATH THORACIC 36FR (CATHETERS) ×3 IMPLANT
CLIP TI MEDIUM 24 (CLIP) IMPLANT
CLIP TI WIDE RED SMALL 24 (CLIP) IMPLANT
CONTAINER PROTECT SURGISLUSH (MISCELLANEOUS) ×6 IMPLANT
COVER MAYO STAND STRL (DRAPES) IMPLANT
DERMABOND ADVANCED .7 DNX6 (GAUZE/BANDAGES/DRESSINGS) IMPLANT
DRAIN CHANNEL 28F RND 3/8 FF (WOUND CARE) ×3 IMPLANT
DRAPE EXTREMITY T 121X128X90 (DISPOSABLE) IMPLANT
DRAPE HALF SHEET 40X57 (DRAPES) IMPLANT
DRAPE SRG 135X102X78XABS (DRAPES) ×3 IMPLANT
DRAPE WARM FLUID 44X44 (DRAPES) ×3 IMPLANT
DRSG COVADERM 4X14 (GAUZE/BANDAGES/DRESSINGS) ×3 IMPLANT
ELECTRODE BLDE 4.0 EZ CLN MEGD (MISCELLANEOUS) IMPLANT
ELECTRODE REM PT RTRN 9FT ADLT (ELECTROSURGICAL) ×6 IMPLANT
FELT TEFLON 1X6 (MISCELLANEOUS) ×3 IMPLANT
GAUZE SPONGE 4X4 12PLY STRL (GAUZE/BANDAGES/DRESSINGS) ×6 IMPLANT
GLOVE BIOGEL PI IND STRL 7.0 (GLOVE) ×6 IMPLANT
GLOVE BIOGEL PI MICRO STRL 7 (GLOVE) ×9 IMPLANT
GOWN STRL REUS W/ TWL LRG LVL3 (GOWN DISPOSABLE) ×15 IMPLANT
GOWN STRL REUS W/ TWL XL LVL3 (GOWN DISPOSABLE) ×6 IMPLANT
HEMOSTAT POWDER SURGIFOAM 1G (HEMOSTASIS) ×9 IMPLANT
HEMOSTAT SURGICEL 2X14 (HEMOSTASIS) ×3 IMPLANT
INSERT FOGARTY XLG (MISCELLANEOUS) ×3 IMPLANT
INSERT SUTURE HOLDER (MISCELLANEOUS) ×3 IMPLANT
KIT BASIN OR (CUSTOM PROCEDURE TRAY) ×3 IMPLANT
KIT SUCTION CATH 14FR (SUCTIONS) ×3 IMPLANT
KIT TURNOVER KIT B (KITS) ×3 IMPLANT
KIT VASOVIEW HEMOPRO 2 VH 4000 (KITS) ×3 IMPLANT
LEAD PACING MYOCARDI (MISCELLANEOUS) IMPLANT
MARKER GRAFT CORONARY BYPASS (MISCELLANEOUS) ×9 IMPLANT
PACK E OPEN HEART (SUTURE) ×3 IMPLANT
PACK OPEN HEART (CUSTOM PROCEDURE TRAY) ×3 IMPLANT
PAD ARMBOARD POSITIONER FOAM (MISCELLANEOUS) ×6 IMPLANT
PAD ELECT DEFIB RADIOL ZOLL (MISCELLANEOUS) ×3 IMPLANT
PENCIL BUTTON HOLSTER BLD 10FT (ELECTRODE) ×3 IMPLANT
POSITIONER HEAD DONUT 9IN (MISCELLANEOUS) ×3 IMPLANT
PUNCH AORTIC ROTATE 4.0MM (MISCELLANEOUS) ×3 IMPLANT
SET MPS 3-ND DEL (MISCELLANEOUS) IMPLANT
SOLN 0.9% NACL POUR BTL 1000ML (IV SOLUTION) ×15 IMPLANT
SOLN STERILE WATER BTL 1000 ML (IV SOLUTION) ×6 IMPLANT
SPONGE T-LAP 18X18 ~~LOC~~+RFID (SPONGE) IMPLANT
SUPPORT HEART JANKE-BARRON (MISCELLANEOUS) ×3 IMPLANT
SUT BONE WAX W31G (SUTURE) ×3 IMPLANT
SUT ETHIBOND X763 2 0 SH 1 (SUTURE) ×12 IMPLANT
SUT MNCRL AB 4-0 PS2 18 (SUTURE) IMPLANT
SUT PROLENE 4 0 SH DA (SUTURE) ×3 IMPLANT
SUT PROLENE 4-0 RB1 .5 CRCL 36 (SUTURE) IMPLANT
SUT PROLENE 6 0 C 1 30 (SUTURE) ×6 IMPLANT
SUT PROLENE 7 0 BV1 MDA (SUTURE) ×3 IMPLANT
SUT STEEL 6MS V (SUTURE) ×3 IMPLANT
SUT STEEL STERNAL CCS#1 18IN (SUTURE) IMPLANT
SUT STEEL SZ 6 DBL 3X14 BALL (SUTURE) ×6 IMPLANT
SUT VIC AB 1 CTX36XBRD ANBCTR (SUTURE) ×6 IMPLANT
SUT VIC AB 2-0 CT1 TAPERPNT 27 (SUTURE) IMPLANT
SUT VIC AB 2-0 CTX 27 (SUTURE) IMPLANT
SYSTEM SAHARA CHEST DRAIN ATS (WOUND CARE) ×3 IMPLANT
TAPE CLOTH SURG 4X10 WHT LF (GAUZE/BANDAGES/DRESSINGS) IMPLANT
TAPE PAPER 2X10 WHT MICROPORE (GAUZE/BANDAGES/DRESSINGS) IMPLANT
TOWEL GREEN STERILE (TOWEL DISPOSABLE) ×3 IMPLANT
TOWEL GREEN STERILE FF (TOWEL DISPOSABLE) ×3 IMPLANT
TRAY FOLEY SLVR 16FR TEMP STAT (SET/KITS/TRAYS/PACK) ×3 IMPLANT
TUBE SUCT INTRACARD DLP 20F (MISCELLANEOUS) ×3 IMPLANT
TUBE SUCTION CARDIAC 10FR (CANNULA) ×3 IMPLANT
TUBING LAP HI FLOW INSUFFLATIO (TUBING) ×3 IMPLANT
UNDERPAD 30X36 HEAVY ABSORB (UNDERPADS AND DIAPERS) ×3 IMPLANT

## 2024-09-01 NOTE — Hospital Course (Addendum)
 Referring:  Mady Bruckner, MD Primary Care: Severa Rock HERO, FNP  History of Present Illness:  Bryan Shepard is a pleasant 68 year old man who presents with NSTEMI, found to have multivessel coronary artery disease on LHC.  He started having chest pain after unloading his car after a trip.  At that time, his BP was 220s/100s so he called 911.  He is a good operative candidate.  He exercises several times a week and works as a engineer, water.  He is a never smoker and does not drink alcohol. He lives in a house with his wife and is independent in all ADLs.  His TTE demonstrated normal Biventricular function and no valvular disease.     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  He is right handed.  He has not had chest surgery or radiation.  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   I discussed the general nature of the procedure, including the need for general anesthesia, the incisions to be used, the use of cardiopulmonary bypass, and the use of temporary pacemaker wires and drainage tubes postoperatively with Mr. Bostrom.  We discussed the expected hospital stay, overall recovery and short and long term outcomes. I informed him of the indications, risks, benefits and alternatives.   He understands the risks include, but are not limited to death, stroke, MI, DVT/PE, bleeding, possible need for transfusion, infections, cardiac arrhythmias, as well as other organ system dysfunction including respiratory (eg: prolonged ventilation), renal, or GI complications.    We will obtain vein mapping to look at this legs given the varicosities.  I explained there is a chance we may need to use his left radial artery if he does not have sufficient or appropriate venous conduit.  Plan for CABG  tomorrow.   Hospital Course: Mr. Gainor remained stable on the heparin  infusion.  He decided to proceed with surgery and was taken to the OR on 09/01/24 where CABG x 4 was carried out, utilizing LIMA to LAD, SVG to PDA, SVG to OM, and SVG to Ramus Intermediate.  He also underwent endoscopic harvest of left leg greater saphenous vein.  He tolerated the procedure without difficulty and was taken to the SICU in stable condition.  He was extubated the evening of surgery without difficulty.  The patient did not require inotropic or vasopressor support post operatively.  He was mobilized on the first post-op day.  The mediastinal tubes and pacer wires were removed on the second post-op day and he was transferred to 4E Progressive [facility].  The patient's chest tube output remained low with removal of chest tube on 11/29.  Follow up chest xray showed no pneumothorax, improvement of atelectasis, and trace left pleural effusion.  He was started on IV Lasix  to facilitate diuresis.  His diabetes is poorly controlled with preoperative A1c of 8.5.  He has been trying to control with diet which is has not been effective.  He will need close follow up with primary care physician.  He was started on Metformin  500 mg BID to aide in management of this.  He remains in NSR.  He is ambulating without difficulty.  His surgical incisions are healing without evidence of infection.  He is stable for discharge home today.

## 2024-09-01 NOTE — Discharge Instructions (Signed)
 Discharge Instructions:  1. You may shower, please wash incisions daily with soap and water  and keep dry.  If you wish to cover wounds with dressing you may do so but please keep clean and change daily.  No tub baths or swimming until incisions have completely healed.  If your incisions become red or develop any drainage please call our office at [phone]  2. No Driving until cleared by Dr. Linward office and you are no longer using narcotic pain medications  3. Monitor your weight daily. Please use the same scale and weigh at same time. If you gain 5-10 lbs in 48 hours with associated lower extremity swelling, please contact our office at [phone]  4. Fever of 101.5 for at least 24 hours with no source, please contact our office at [phone]  5. Activity- up as tolerated, please walk at least 3 times per day.  Avoid strenuous activity, no lifting, pushing, or pulling with your arms over 8-10 lbs for a minimum of 6 weeks  6. If any questions or concerns arise, please do not hesitate to contact our office at [phone]  7. Please contact your primary care provider ETTER Rock HERO [place], [place]  [phone]) for a follow up appointment within 2 weeks of your hospital discharge.

## 2024-09-01 NOTE — Transfer of Care (Signed)
 Immediate Anesthesia Transfer of Care Note  Patient: Bryan Shepard.  Procedure(s) Performed: CORONARY ARTERY BYPASS GRAFTING X 4, USING LEFT INTERAL MAMMARY ARTERY AND ENDOSCOPIC HARVESTED LEFT GREATER SAPHENOUS VEIN (Chest) ECHOCARDIOGRAM, TRANSESOPHAGEAL, INTRAOPERATIVE  Patient Location: ICU  Anesthesia Type:General  Level of Consciousness: sedated and Patient remains intubated per anesthesia plan  Airway & Oxygen Therapy: Patient remains intubated per anesthesia plan and Patient placed on Ventilator (see vital sign flow sheet for setting)  Post-op Assessment: Report given to RN and Post -op Vital signs reviewed and stable  Post vital signs: Reviewed and stable  Last Vitals:  Vitals Value Taken Time  BP 102/68 09/01/24  15:34  Temp    Pulse 86 09/01/24 15:34  Resp 18 09/01/24 15:34  SpO2 98 % 09/01/24 15:34    Last Pain:  Vitals:   09/01/24 0647  TempSrc: Oral  PainSc: 0-No pain         Complications: No notable events documented.

## 2024-09-01 NOTE — Op Note (Signed)
 Date of Surgery: 09/01/24  Preop Diagnosis:  Multivessel coronary artery disease Postop Diagnosis: Same Surgeon:  Con GORMAN Clunes, MD Assistant(s):  Wayne Gold PA-C (performed left [facility])  Experienced assistance was necessary for this case due to surgical complexity. Wayne assisted by independently harvesting the saphenous vein endoscopically and closing the leg incision. He then assisted with retraction of delicate tissues, exposure, suctioning, and suture management during the anastomosis.  Anesthesia: GET  Procedures: 1. CABG x 4 (SVG - PDA, SVG - OM, SVG - ramus, LIMA - LAD) 2. Endoscopic Vein Harvest   Cross clamp time: 111 min Bypass time: 168 min  Approach:  Median sternotomy Drains: 1 left pleural 28 fr, 1 mediastinal 28 Fr right angle, 1 mediastinal 36 Fr straight Pacing Wires: atrial and ventricular Findings of Procedure: 1. Soft tissue all very inflamed and thickened 2. Target quality:  PDA - fair, OM - good, ramus - fair, LAD - good 3. SVG quite large in caliber  Estimated Blood Loss: 812 mL Blood Products Administered: platelets x 1 Disposition:  ICU Specimens:  none Indication: Mr. Asebedo is a pleasant 68 year old man who presents with NSTEMI, found to have multivessel coronary artery disease on LHC.  He started having chest pain after unloading his car after a trip.  At that time, his BP was 220s/100s so he called 911.  He is a good operative candidate.  He exercises several times a week and works as a engineer, water.  He is a never smoker and does not drink alcohol. He lives in a house with his wife and is independent in all ADLs.  His TTE demonstrated normal Biventricular function and no valvular disease.     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  He is right handed.  He has not had chest surgery or  radiation.  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  He is in agreement to proceed with CABG.   Procedure in Detail:  The risks, benefits, complications, treatment options, and expected outcomes were discussed with the patient. The possibilities of reaction to medication, pulmonary aspiration, perforation of viscus, bleeding, recurrent infection, the need for additional procedures, failure to diagnose a condition, and creating a complication requiring transfusion or operation were discussed with the patient. The patient concurred with the proposed plan, giving informed consent. The patient was taken to the operating room, identified as [name] and the procedure verified as Coronary Artery Bypass Grafting. A Time Out was held and the above information confirmed.  The patient was prepped and draped in a sterile fashion. An endoscopic vein harvesting in the left leg was carried out by Wayne Gold PA-C. A standard median sternotomy was performed. The left internal mammary artery was taken down using cautery and clips and left pleural chest tube was placed.  Intervenous heparin  was given at the appropriate dose to obtain anticoagulation sufficient for CPB. The LIMA was then transected distally. The flow was excellent.   The pericardium was then opened and the pericardial well was created. The aorta was palpated and free of calcium /plaque.  Concentric purse strings were placed and the aorta was cannulated with a 20 fr EOPA cannula. After adequate de-airing this cannula was connected to the CPB circuit.  A purse string  was placed around the right atrial appendage and it was cannulated with a large triple stage venous cannula. Prior to initiating CPB the LIMA was fashioned to the appropriate length.   Cardiopulmonary bypass was then initiated. The distal targets (PDA, OM, ramus and LAD)  were then evaluated and marked. The heart basket was then placed into the  appropriate position. An antegrade vent/cardioplegia cannula was then placed. The aortic cross clamp was then applied and the heart was arrested with antegrade cardioplegia. Cardioplegia was then administered intermittantly to ensure adequate myocardial protection.   The distal anastomosis to the PDA was completed first. After positioning the heart, the artery was opened and RSV was anastomosed to the artery using 7-0 prolene in a running fashion. The flow was tested and was adequate.   Next, the distal anastomosis to  to the OM was completed. After positioning the heart, the artery was opened and RSV was anastomosed to the artery using 7-0 prolene in a running fashion. The flow was tested and was adequate.    Next, the distal anastomosis to  to the ramus was completed. After positioning the heart, the artery was opened and RSV was anastomosed to the artery using 7-0 prolene in a running fashion. The flow was tested and was adequate.   Next the LIMA to LAD anastomosis was completed in a similar fashion with 7-0 prolene.    The proximal anastomoses were then completed in the standard fashion using 6-0 prolene in a running fashion. After adequate deairing maneuvers, the cross clamp was removed and the heart was reperfused.   Examination of all surgical sites revealed good hemostasis. Atrial and ventricular epicardial pacing wires were then placed. The patient was fully ventillated and successfully weaned off of CPB. After a test dose of protamine , the patient was fully decannulated.  The heparin  was fully reversed with protamine . Again, examination of all surgical sites were evaluated for hemostasis which was good.  Post-bypass TEE revealed preserved RV/LV function and no new wall motion abnormalities.    Mediastinal drains were placed. The sternum was closed using stainless steel wires. The fascia was closed with #1 vicryl and the deep dermal with 2-0 Vicryl. The skin was closed with 3-0 Vicryl. A  sterile dressing was applied over the incision.  At the end of the operation, all sponge, instruments, and needle counts were correct.   The patient tolerated the procedure without complication and was transported to the CTICU in stable condition.  Complications: none  Specimen: None  Implants: None

## 2024-09-01 NOTE — Procedures (Signed)
 Extubation Procedure Note  Patient Details:   Name: Bryan Shepard. DOB: Mar 31, 1956 MRN: [id number]   Airway Documentation:    Vent end date: 09/01/24 Vent end time: 1854   Evaluation  O2 sats: stable throughout Complications: No apparent complications Patient did tolerate procedure well. Bilateral Breath Sounds: Clear, Diminished   Yes,  Per CCM order, RT extubated to [name]. Prior to extubation, pt did have positive cuff leak. Pt tolerated well with SVS, no stridor noted but audible exp wheezing and pt was able to state his name.  Iva Posten R 09/01/2024, 6:54 PM

## 2024-09-01 NOTE — Anesthesia Procedure Notes (Signed)
 Procedure Name: Intubation Date/Time: 09/01/2024 8:02 AM  Performed by: Arvell Edsel HERO, CRNAPre-anesthesia Checklist: Patient identified, Emergency Drugs available, Suction available, Patient being monitored and Timeout performed Patient Re-evaluated:Patient Re-evaluated prior to induction Oxygen Delivery Method: Circle system utilized Preoxygenation: Pre-oxygenation with 100% oxygen Induction Type: IV induction Ventilation: Oral airway inserted - appropriate to patient size and Mask ventilation without difficulty Laryngoscope Size: Mac and 3 Grade View: Grade II Tube type: Oral Tube size: 8.0 mm Number of attempts: 1 Airway Equipment and Method: Patient positioned with wedge pillow and Stylet Placement Confirmation: ETT inserted through vocal cords under direct vision, positive ETCO2 and breath sounds checked- equal and bilateral Secured at: 24 cm Tube secured with: Tape Dental Injury: Teeth and Oropharynx as per pre-operative assessment

## 2024-09-01 NOTE — Consult Note (Incomplete)
 NAME:  Bryan Shepard., MRN:  [id number], DOB:  1956/06/13, LOS: 3 ADMISSION DATE:  08/29/2024, CONSULTATION DATE:  *** REFERRING MD:  ***, CHIEF COMPLAINT:  ***   History of Present Illness:  ***  Pertinent  Medical History  ***  Significant Hospital Events: Including procedures, antibiotic start and stop dates in addition to other pertinent events     Interim History / Subjective:  ***  Objective   Blood pressure 126/78, pulse 72, temperature 98.4 F (36.9 C), temperature source Oral, resp. rate 15, height 5' 7 (1.702 m), weight 90.4 kg, SpO2 98%.        Intake/Output Summary (Last 24 hours) at 09/01/2024 1117 Last data filed at 09/01/2024 1018 Gross per 24 hour  Intake --  Output 250 ml  Net -250 ml   Filed Weights   08/31/24 0528 09/01/24 0418 09/01/24 0649  Weight: 89.3 kg 90.4 kg 90.4 kg    Examination: General: *** HENT: *** Lungs: *** Cardiovascular: *** Abdomen: *** Extremities: *** Neuro: *** GU: ***  {Icu pulm iv lines:31576::A-line, Day# ***,PIC line, Day# ***,Central line, Day# ***,Norva Purl, Day# ***,IV, Day# ***} Resolved Hospital Problem list   ***  Assessment & Plan:  ***   Labs   CBC: Recent Labs  Lab 08/29/24 1813 08/30/24 0535 08/31/24 0342 09/01/24 0315 09/01/24 0810 09/01/24 0820 09/01/24 1026 09/01/24 1103 09/01/24 1108  WBC 7.5 9.2 10.1 9.7  --   --   --   --   --   HGB 14.5 14.2 13.5 13.2 11.6* 12.2* 12.6* 8.8* 9.2*  HCT 42.5 41.3 39.9 38.9* 34.0* 36.0* 37.0* 26.0* 27.0*  MCV 85.2 84.8 84.4 84.4  --   --   --   --   --   PLT 183 163 168 158  --   --   --   --   --     Basic Metabolic Panel: Recent Labs  Lab 08/29/24 1813 08/30/24 0535 08/31/24 0342 09/01/24 0315 09/01/24 0810 09/01/24 0820 09/01/24 1026 09/01/24 1103 09/01/24 1108  NA 137 138 137 134* 138 137 139 138 137  K 4.0 3.9 3.8 3.8 3.9 3.9 4.3 4.4 3.7  CL 103 105 105 104 102  --  103  --   --   CO2 25 23 22  20*  --   --   --   --    --   GLUCOSE 244* 188* 163* 153* 151*  --  141*  --   --   BUN 16 14 16 17 16   --  17  --   --   CREATININE 1.24 1.05 1.20 1.21 1.10  --  1.00  --   --   CALCIUM  8.6* 8.5* 8.6* 8.5*  --   --   --   --   --   MG  --  2.1 1.6* 2.2  --   --   --   --   --   PHOS  --  2.6  --   --   --   --   --   --   --    GFR: Estimated Creatinine Clearance: 76.9 mL/min (by C-G formula based on SCr of 1 mg/dL). Recent Labs  Lab 08/29/24 1813 08/30/24 0535 08/31/24 0342 09/01/24 0315  WBC 7.5 9.2 10.1 9.7    Liver Function Tests: Recent Labs  Lab 09/01/24 0315  AST 23  ALT 26  ALKPHOS 55  BILITOT 1.2  PROT 6.1*  ALBUMIN  3.2*  No results for input(s): LIPASE, AMYLASE in the last 168 hours. No results for input(s): AMMONIA in the last 168 hours.  ABG    Component Value Date/Time   PHART 7.341 (L) 09/01/2024 1103   PCO2ART 40.0 09/01/2024 1103   PO2ART 374 (H) 09/01/2024 1103   HCO3 23.5 09/01/2024 1108   TCO2 25 09/01/2024 1108   ACIDBASEDEF 2.0 09/01/2024 1108   O2SAT 78 09/01/2024 1108     Coagulation Profile: Recent Labs  Lab 08/29/24 1813  INR 1.0    Cardiac Enzymes: No results for input(s): CKTOTAL, CKMB, CKMBINDEX, TROPONINI in the last 168 hours.  HbA1C: HB A1C (BAYER DCA - WAIVED)  Date/Time Value Ref Range Status  04/06/2024 11:16 AM 8.8 (H) 4.8 - 5.6 % Final    Comment:             Prediabetes: 5.7 - 6.4          Diabetes: >6.4          Glycemic control for adults with diabetes: <7.0   01/01/2024 11:03 AM 6.6 (H) 4.8 - 5.6 % Final    Comment:             Prediabetes: 5.7 - 6.4          Diabetes: >6.4          Glycemic control for adults with diabetes: <7.0    Hgb A1c MFr Bld  Date/Time Value Ref Range Status  08/30/2024 05:35 AM 8.4 (H) 4.8 - 5.6 % Final    Comment:    (NOTE)         Prediabetes: 5.7 - 6.4         Diabetes: >6.4         Glycemic control for adults with diabetes: <7.0     CBG: Recent Labs  Lab 08/31/24 0616  08/31/24 1205 08/31/24 1639 08/31/24 2106 09/01/24 0604  GLUCAP 168* 155* 174* 181* 154*    Review of Systems:   ***  Past Medical History:  He,  has a past medical history of Acid reflux, Colon polyps, Diabetes mellitus type 2 in nonobese (HCC) (12/13/2020), Family history of genetic mutation for hereditary nonpolyposis colorectal cancer (HNPCC), Family history of prostate cancer, Family history of stomach cancer, HOH (hard of hearing), Hypertension, Sleep apnea, Stroke (HCC) (2015), and TIA (transient ischemic attack) (2016).   Surgical History:   Past Surgical History:  Procedure Laterality Date   CHOLECYSTECTOMY N/A 04/17/2020   Procedure: LAPAROSCOPIC CHOLECYSTECTOMY;  Surgeon: Mavis Anes, MD;  Location: [facility];  Service: General;  Laterality: N/A;   COLONOSCOPY  2018   COLONOSCOPY WITH PROPOFOL  N/A 12/07/2021   Surgeon: Cindie Dunnings K, DO;   nonbleeding internal hemorrhoids, 8 mm polyp (tubular adenoma) in the descending colon removed.  Recommended 5-year surveillance.   LEFT HEART CATH AND CORONARY ANGIOGRAPHY N/A 08/30/2024   Procedure: LEFT HEART CATH AND CORONARY ANGIOGRAPHY;  Surgeon: Mady Bruckner, MD;  Location: [facility];  Service: Cardiovascular;  Laterality: N/A;   POLYPECTOMY  12/07/2021   Procedure: POLYPECTOMY;  Surgeon: Cindie Dunnings POUR, DO;  Location: [facility] SU[facility];  Service: Endoscopy;;     Social History:   reports that he has never smoked. He has never used smokeless tobacco. He reports that he does not drink alcohol and does not use drugs.   Family History:  His family history includes Bladder Cancer in an other family member; Diabetes in his father; Heart disease in his father and mother; Hypertension  in his mother; Lung cancer in his paternal grandmother; Melanoma in his father; Melanoma (age of onset: 47) in his mother; Prostate cancer (age of onset: 77) in his paternal grandfather; Prostate cancer (age of onset: 63) in his father;  Stomach cancer (age of onset: 43) in his father; Stroke in his maternal grandmother and mother; Throat cancer in his paternal grandmother; Transient ischemic attack in his mother.   Allergies No Known Allergies   Home Medications  Prior to Admission medications   Medication Sig Start Date End Date Taking? Authorizing Provider  albuterol  (VENTOLIN  HFA) 108 (90 Base) MCG/ACT inhaler Inhale 2 puffs into the lungs every 6 (six) hours as needed for shortness of breath. 06/19/24  Yes [provider]  aspirin  81 MG chewable tablet Chew 81 mg by mouth daily.   Yes [provider]  enalapril  (VASOTEC ) 2.5 MG tablet Take 1 tablet (2.5 mg total) by mouth daily. 04/06/24  Yes Rakes, Rock HERO, FNP  Accu-Chek FastClix Lancets MISC CHECK BS DAILY DX E11.9 01/07/24   Severa Rock HERO, FNP  Blood Glucose Monitoring Suppl (ACCU-CHEK GUIDE ME) w/Device KIT CHECK BS DAILY DX E11.9 07/07/23   Rakes, Rock HERO, FNP  Continuous Glucose Sensor (DEXCOM G7 SENSOR) MISC 1 Device by Does not apply route every 14 (fourteen) days. 05/05/24   Severa Rock HERO, FNP  glucose blood (FREESTYLE LITE) test strip Check BS daily Dx E11.9 01/07/24   Severa Rock HERO, FNP     Critical care time: ***

## 2024-09-01 NOTE — Progress Notes (Signed)
  TCTS Evening Rounds:   Hemodynamically stable  CI = 2.8  Ready to start vent wean  Urine output good  CT output low  CBC    Component Value Date/Time   WBC 10.6 (H) 09/01/2024 1554   RBC 3.66 (L) 09/01/2024 1554   HGB 10.7 (L) 09/01/2024 1554   HGB 15.5 07/04/2023 1218   HCT 31.0 (L) 09/01/2024 1554   HCT 47.2 07/04/2023 1218   PLT 128 (L) 09/01/2024 1554   PLT 222 07/04/2023 1218   MCV 84.7 09/01/2024 1554   MCV 88 07/04/2023 1218   MCH 29.2 09/01/2024 1554   MCHC 34.5 09/01/2024 1554   RDW 12.8 09/01/2024 1554   RDW 12.8 07/04/2023 1218   LYMPHSABS 1.9 07/04/2023 1218   MONOABS 0.4 04/15/2020 0527   EOSABS 0.2 07/04/2023 1218   BASOSABS 0.1 07/04/2023 1218     BMET    Component Value Date/Time   NA 138 09/01/2024 1409   NA 139 10/03/2023 1241   K 4.4 09/01/2024 1409   CL 104 09/01/2024 1404   CO2 20 (L) 09/01/2024 0315   GLUCOSE 160 (H) 09/01/2024 1404   BUN 15 09/01/2024 1404   BUN 18 10/03/2023 1241   CREATININE 1.00 09/01/2024 1404   CALCIUM  8.5 (L) 09/01/2024 0315   EGFR 64 10/03/2023 1241   GFRNONAA >60 09/01/2024 0315     A/P:  Stable postop course. Continue current plans

## 2024-09-01 NOTE — Progress Notes (Signed)
 Now awake Hemodynamics stable. MAP boarderline to needing to add back antihypertensives but think this will improve post extubation  F/Vt excellent Denies SOB VTs 500s rr 20s no accessory use ABG reviewed mild metabolic acidosis w/ base def  Plan 1 amp bicarb Extubate to [place] Stop dex IS  CPAP at HS Hope to avoid needing the cardene  given his hypoxic response to cleviprex    Maude FORBES Banner ACNP-BC [facility] Pulmonary/Critical Care Pager # [phone] OR # [phone] if no answer

## 2024-09-01 NOTE — Progress Notes (Signed)
 Extubated earlier this evening Initially to [place].  During extubation process MAP was trending up. We had hoped post-extubation would correct this but remained hypertensive so as planned placed on Nicardipine  gtt.  Of note he was also exhibiting sig post op discomfort  We administered 1 mg of morphine  prior to the Nicardipine  w/ little effect on his BP  Not long after infusion started I was notified by the nursing staff that he again dropped his pulse ox 88-90 w/ O2 needs up to 6 lpm. W 7.5 cardene  infusion to achieve MAP goal   -was placed initially on NIPPV given degree of hypoxia -team was able to wean Nicardipine  off and currently O2 sats have improved.  -of note abg did show resp alkalosis superimposed on his residual metabolic acidosis.  Plan Cont NIPPV for now and repeat abg 1 hr If PCO2 remains low will transition over to planned CPAP  I expect his pulse ox to recover as it did earlier today Additionally re: MAP goal will try NTG gtt to achieve MAP <90 and dc the nicardipine  of the MAR. Plan of care was discussed w/ nursing team.

## 2024-09-01 NOTE — Anesthesia Procedure Notes (Signed)
 Central Venous Catheter Insertion Performed by: Darlyn Rush, MD, anesthesiologist Start/End11/26/2025 7:05 AM, 09/01/2024 7:28 AM Patient location: Pre-op. Preanesthetic checklist: patient identified, IV checked, site marked, risks and benefits discussed, surgical consent, monitors and equipment checked, pre-op evaluation, timeout performed and anesthesia consent Position: Trendelenburg Lidocaine  1% used for infiltration and patient sedated Hand hygiene performed , maximum sterile barriers used  and Seldinger technique used Catheter size: 8.5 Fr Sheath introducer Procedure performed using ultrasound to evaluate access site. Ultrasound Notes:relevant anatomy identified, ultrasound used to visualize needle entry, vessel patent under ultrasound and image(s) printed for medical record. Attempts: 1 Following insertion, line sutured, dressing applied and Biopatch. Post procedure assessment: blood return through all ports, free fluid flow and no air  Patient tolerated the procedure well with no immediate complications. Additional procedure comments: Triple lumen catheter placed through introducer port.

## 2024-09-01 NOTE — Consult Note (Signed)
 NAME:  Bryan Shepard., MRN:  [id number], DOB:  Mar 27, 1956, LOS: 3 ADMISSION DATE:  08/29/2024, CONSULTATION DATE:  11/26 REFERRING MD:  SU, CHIEF COMPLAINT:  post-CABG   History of Present Illness:   68 year old male w/Hx as outlined below. Presented to ER 11/23 w/cc: chest pain X1-2d w/ associated SOB. Initial ECG NSR w/ PACs but no ischemia,+ rising trops w/on-going pain concerning for NSTEMI. Went to cardiac cath 11/24 which showed severe 3 CAD (90 and 80% prox/mid LAD, 90-95% bifurcation of Ramous intermedius, 95% OM1 and total occlusion of distal RCA. LVEDP noted at . Placed on ASA, NTG paste and isosorbide  started. ECHO EF60-65%mild LVH, grade I diastolic dysfxn, nml RV fxn, TAPSE 23mm Cardiothoracic surgery was consulted for CABG and pt was brought to OR on 11/26 with Dr Daniel. Critical care consulted to assist w/post-operative critical care management   OR report Pump time: 2h Clamp time: 1h 50 min  EBL: 812 Cell saver 460 Products: 300 ml PLTs ~ 2 liters crystalloid  Post ECHO EF still nml, mild MR Pertinent  Medical History  OSA, dyslipidemia, CKD stage III, type II DM, CVA x 2   Significant Hospital Events: Including procedures, antibiotic start and stop dates in addition to other pertinent events   11/23 admitted w/CP rising trops c/w NSTEMI 11/24 left heart cath svr 3V disease. Mildly elevated LVEDP . ECHO nml EF, gd 1 diastolic dysfxn. LVH 11/26 CABG 3V (su)  Interim History / Subjective:  Arrived to ICU sedated on Dex 0.7 Not currently on Vasoactive gtts.  CI 2.7 SVR 1007 25 ml in CT   Objective    Blood pressure 126/78, pulse 72, temperature 98.4 F (36.9 C), temperature source Oral, resp. rate 15, height 5' 7 (1.702 m), weight 90.4 kg, SpO2 98%.        Intake/Output Summary (Last 24 hours) at 09/01/2024 1133 Last data filed at 09/01/2024 1018 Gross per 24 hour  Intake --  Output 250 ml  Net -250 ml   Filed Weights   08/31/24 0528  09/01/24 0418 09/01/24 0649  Weight: 89.3 kg 90.4 kg 90.4 kg    Examination: General: 68 year old male sedated on dex  HENT: NCAT no JVD OETT PERRL orally intubated right internal jugular CVL Lungs: clear bilaterally. Left Pleural tube w/ 2 mediastinal tubes in place. 25 ml in [place]. PCXR ett good position. Left basilar atx. Cts good position. Low vol. No PTX  Cardiovascular: sternal dressing CD&I. Pacer wires in place. Atrial paced at 86 pericardial rub noted on exam  Abdomen: soft not tender  Extremities: warm dry brisk cr no edema left LE wrapped  Neuro: sedated on dex GU: clear yellow via FC  Resolved problem list   Assessment and Plan   NSTEMI w/ severe 4V CAD. Now s/p 3V CABG w/ SVG(11/26) Plan F/u post operative ABG, CXR, coags, fibrinogen , CBC, lactate, chemistry and 12-lead Passive rewarming and initiate precedex  wean once normothermic and hemodynamically stable Continue post operative telemetry monitoring for conduction disturbances Continue epicardial pacing at 86 bpm MAP goal 65-80;   post-operative orderset w/ PRN Norepi to keep MAP > 65 and clevidipine  for sustained MAP >90   Close observation for evidence of bleeding, including chest tube output and surgical site.Monitor Chest tube output if  >400cc in 1 hr, >300 for 2 consecutive hours, >200cc for three consecutive hours notify surgical team Tight glycemic control Multi-modal analgesia  Post op asa and statin Post op BB when  able SCDs immediate post-op; w/ DVT prophylaxis, anticipate post-op Milroy heparin  on 11/28  Complete prophylactic antibiotics   Early mobilization post extubation   Post op Vent management  Plan F/u pcxr and abg Initiate wean protocol once hemodynamics stable and normothermic VAP bundle  Expected acute blood loss anemia  Baseline hgb: 13.2 Plan Trend CBC, checking fibrinogen   Transfuse for hgb < 7.5 or push to hgb goal of 8-9 if in shock w/ blood loss  Thrombocytopenia.  Baseline 158;  Got 1 pk PLTs intra-op for plt 150 Plan F/u post op CBC Transfuse for < 150 if actively bleeding   CKD stageIIIa Baseline cr:1.2-1.3 range Plan MAP goal > 65 Renal dose meds Strict I&O  Am chems   H/o HTN  Plan Holding enalapril  for now  H/o HLD Plan Post-op statin  H/o DM type II Plan Insulin  gtt for tight post operative glycemic control then transition to ssi   H/o CVA X 2 Note baseline slurred sp w/ fatigue otherwise no deficits  Plan Resume asa and statin postop  H/o OSA uses CPAP at HS  Plan CPAP HS post extubation  Cont pulse ox   Mild dilated ascending aorta Plan Need annual imaging w/ CTa or MRI Labs   CBC: Recent Labs  Lab 08/29/24 1813 08/30/24 0535 08/31/24 0342 09/01/24 0315 09/01/24 0810 09/01/24 0820 09/01/24 1026 09/01/24 1103 09/01/24 1108  WBC 7.5 9.2 10.1 9.7  --   --   --   --   --   HGB 14.5 14.2 13.5 13.2 11.6* 12.2* 12.6* 8.8* 9.2*  HCT 42.5 41.3 39.9 38.9* 34.0* 36.0* 37.0* 26.0* 27.0*  MCV 85.2 84.8 84.4 84.4  --   --   --   --   --   PLT 183 163 168 158  --   --   --   --   --     Basic Metabolic Panel: Recent Labs  Lab 08/29/24 1813 08/30/24 0535 08/31/24 0342 09/01/24 0315 09/01/24 0810 09/01/24 0820 09/01/24 1026 09/01/24 1103 09/01/24 1108  NA 137 138 137 134* 138 137 139 138 137  K 4.0 3.9 3.8 3.8 3.9 3.9 4.3 4.4 3.7  CL 103 105 105 104 102  --  103  --   --   CO2 25 23 22  20*  --   --   --   --   --   GLUCOSE 244* 188* 163* 153* 151*  --  141*  --   --   BUN 16 14 16 17 16   --  17  --   --   CREATININE 1.24 1.05 1.20 1.21 1.10  --  1.00  --   --   CALCIUM  8.6* 8.5* 8.6* 8.5*  --   --   --   --   --   MG  --  2.1 1.6* 2.2  --   --   --   --   --   PHOS  --  2.6  --   --   --   --   --   --   --    GFR: Estimated Creatinine Clearance: 76.9 mL/min (by C-G formula based on SCr of 1 mg/dL). Recent Labs  Lab 08/29/24 1813 08/30/24 0535 08/31/24 0342 09/01/24 0315  WBC 7.5 9.2 10.1 9.7    Liver  Function Tests: Recent Labs  Lab 09/01/24 0315  AST 23  ALT 26  ALKPHOS 55  BILITOT 1.2  PROT 6.1*  ALBUMIN  3.2*  No results for input(s): LIPASE, AMYLASE in the last 168 hours. No results for input(s): AMMONIA in the last 168 hours.  ABG    Component Value Date/Time   PHART 7.341 (L) 09/01/2024 1103   PCO2ART 40.0 09/01/2024 1103   PO2ART 374 (H) 09/01/2024 1103   HCO3 23.5 09/01/2024 1108   TCO2 25 09/01/2024 1108   ACIDBASEDEF 2.0 09/01/2024 1108   O2SAT 78 09/01/2024 1108     Coagulation Profile: Recent Labs  Lab 08/29/24 1813  INR 1.0    Cardiac Enzymes: No results for input(s): CKTOTAL, CKMB, CKMBINDEX, TROPONINI in the last 168 hours.  HbA1C: HB A1C (BAYER DCA - WAIVED)  Date/Time Value Ref Range Status  04/06/2024 11:16 AM 8.8 (H) 4.8 - 5.6 % Final    Comment:             Prediabetes: 5.7 - 6.4          Diabetes: >6.4          Glycemic control for adults with diabetes: <7.0   01/01/2024 11:03 AM 6.6 (H) 4.8 - 5.6 % Final    Comment:             Prediabetes: 5.7 - 6.4          Diabetes: >6.4          Glycemic control for adults with diabetes: <7.0    Hgb A1c MFr Bld  Date/Time Value Ref Range Status  08/30/2024 05:35 AM 8.4 (H) 4.8 - 5.6 % Final    Comment:    (NOTE)         Prediabetes: 5.7 - 6.4         Diabetes: >6.4         Glycemic control for adults with diabetes: <7.0     CBG: Recent Labs  Lab 08/31/24 0616 08/31/24 1205 08/31/24 1639 08/31/24 2106 09/01/24 0604  GLUCAP 168* 155* 174* 181* 154*    Review of Systems:   Not able   Past Medical History:  He,  has a past medical history of Acid reflux, Colon polyps, Diabetes mellitus type 2 in nonobese (HCC) (12/13/2020), Family history of genetic mutation for hereditary nonpolyposis colorectal cancer (HNPCC), Family history of prostate cancer, Family history of stomach cancer, HOH (hard of hearing), Hypertension, Sleep apnea, Stroke (HCC) (2015), and TIA  (transient ischemic attack) (2016).   Surgical History:   Past Surgical History:  Procedure Laterality Date   CHOLECYSTECTOMY N/A 04/17/2020   Procedure: LAPAROSCOPIC CHOLECYSTECTOMY;  Surgeon: Mavis Anes, MD;  Location: [facility];  Service: General;  Laterality: N/A;   COLONOSCOPY  2018   COLONOSCOPY WITH PROPOFOL  N/A 12/07/2021   Surgeon: Cindie Dunnings K, DO;   nonbleeding internal hemorrhoids, 8 mm polyp (tubular adenoma) in the descending colon removed.  Recommended 5-year surveillance.   LEFT HEART CATH AND CORONARY ANGIOGRAPHY N/A 08/30/2024   Procedure: LEFT HEART CATH AND CORONARY ANGIOGRAPHY;  Surgeon: Mady Bruckner, MD;  Location: [facility];  Service: Cardiovascular;  Laterality: N/A;   POLYPECTOMY  12/07/2021   Procedure: POLYPECTOMY;  Surgeon: Cindie Dunnings POUR, DO;  Location: [facility]ITE;  Service: Endoscopy;;     Social History:   reports that he has never smoked. He has never used smokeless tobacco. He reports that he does not drink alcohol and does not use drugs.   Family History:  His family history includes Bladder Cancer in an other family member; Diabetes in his father; Heart disease in his father and  mother; Hypertension in his mother; Lung cancer in his paternal grandmother; Melanoma in his father; Melanoma (age of onset: 76) in his mother; Prostate cancer (age of onset: 32) in his paternal grandfather; Prostate cancer (age of onset: 87) in his father; Stomach cancer (age of onset: 98) in his father; Stroke in his maternal grandmother and mother; Throat cancer in his paternal grandmother; Transient ischemic attack in his mother.   Allergies No Known Allergies   Home Medications  Prior to Admission medications   Medication Sig Start Date End Date Taking? Authorizing Provider  albuterol  (VENTOLIN  HFA) 108 (90 Base) MCG/ACT inhaler Inhale 2 puffs into the lungs every 6 (six) hours as needed for shortness of breath. 06/19/24  Yes [provider]   aspirin  81 MG chewable tablet Chew 81 mg by mouth daily.   Yes [provider]  enalapril  (VASOTEC ) 2.5 MG tablet Take 1 tablet (2.5 mg total) by mouth daily. 04/06/24  Yes Rakes, Rock HERO, FNP  Accu-Chek FastClix Lancets MISC CHECK BS DAILY DX E11.9 01/07/24   Severa Rock HERO, FNP  Blood Glucose Monitoring Suppl (ACCU-CHEK GUIDE ME) w/Device KIT CHECK BS DAILY DX E11.9 07/07/23   Rakes, Rock HERO, FNP  Continuous Glucose Sensor (DEXCOM G7 SENSOR) MISC 1 Device by Does not apply route every 14 (fourteen) days. 05/05/24   Severa Rock HERO, FNP  glucose blood (FREESTYLE LITE) test strip Check BS daily Dx E11.9 01/07/24   Severa Rock HERO, FNP     Critical care time:  I personally  spent 36 minutes  on this patient which included: review of medical records, nursing notes, progress notes, evaluation, interpretation of lab data and diagnostic studies, taking independent history, performing exam, documenting plan, ordering diagnostics and interventions for the following critical care issues: Acute respiratory failure with the following interventions which included: titration of ventilatory support, titration of hemodynamic drips to desired MAP

## 2024-09-01 NOTE — Progress Notes (Signed)
 Now s/p CABG, will sign off.   Please let the hospitalists know when we can be of further assistance.

## 2024-09-01 NOTE — Progress Notes (Signed)
 MAP trending up  Clevidipine  order placed.  ABG reviewed. No changes for now.  Awaiting remainder of f/u labs

## 2024-09-01 NOTE — Progress Notes (Signed)
 PHARMACY - ANTICOAGULATION CONSULT NOTE  Pharmacy Consult for heparin  Indication: CAD awaiting CABG  Labs: Recent Labs    08/29/24 1813 08/29/24 1813 08/30/24 0535 08/31/24 0342 08/31/24 1030 08/31/24 1833 09/01/24 0315  HGB 14.5  --  14.2 13.5  --   --  13.2  HCT 42.5  --  41.3 39.9  --   --  38.9*  PLT 183  --  163 168  --   --  158  APTT 30  --   --   --   --   --   --   LABPROT 13.4  --   --   --   --   --   --   INR 1.0  --   --   --   --   --   --   HEPARINUNFRC  --    < > 0.19* 0.14* 0.23* 0.22* 0.52  CREATININE 1.24  --  1.05 1.20  --   --   --    < > = values in this interval not displayed.  Assessment/Plan:  68yo male therapeutic on heparin  after rate change. Will continue infusion at current rate of 1950 units/hr until off for OR.  Marvetta Dauphin, PharmD, BCPS 09/01/2024 3:57 AM

## 2024-09-01 NOTE — Anesthesia Procedure Notes (Signed)
 Arterial Line Insertion Start/End11/26/2025 6:53 AM, 09/01/2024 6:53 AM Performed by: Arvell Edsel HERO, CRNA, CRNA  Patient location: Pre-op. Preanesthetic checklist: patient identified, IV checked, site marked, risks and benefits discussed, surgical consent, monitors and equipment checked, pre-op evaluation, timeout performed and anesthesia consent Lidocaine  1% used for infiltration and patient sedated Right, radial was placed Hand hygiene performed , maximum sterile barriers used  and Seldinger technique used Allen's test indicative of satisfactory collateral circulation Attempts: 1 Following insertion, dressing applied and Biopatch. Post procedure assessment: normal  Patient tolerated the procedure well with no immediate complications.

## 2024-09-01 NOTE — Anesthesia Postprocedure Evaluation (Signed)
 Anesthesia Post Note  Patient: Bryan Shepard.  Procedure(s) Performed: CORONARY ARTERY BYPASS GRAFTING X 4, USING LEFT INTERAL MAMMARY ARTERY AND ENDOSCOPIC HARVESTED LEFT GREATER SAPHENOUS VEIN (Chest) ECHOCARDIOGRAM, TRANSESOPHAGEAL, INTRAOPERATIVE     Patient location during evaluation: ICU Anesthesia Type: General Level of consciousness: sedated and patient remains intubated per anesthesia plan Pain management: pain level controlled Vital Signs Assessment: post-procedure vital signs reviewed and stable Respiratory status: patient remains intubated per anesthesia plan Cardiovascular status: stable Postop Assessment: no apparent nausea or vomiting Anesthetic complications: no   No notable events documented.  Last Vitals:  Vitals:   09/01/24 0723 09/01/24 1534  BP:    Pulse: 72 86  Resp: 15 18  Temp:    SpO2: 98% 98%    Last Pain:  Vitals:   09/01/24 0647  TempSrc: Oral  PainSc: 0-No pain                 Debby FORBES Like

## 2024-09-01 NOTE — Progress Notes (Signed)
 Events  On-going hypoxia.  CXR w/ some vol loss left base but otherwise nothing sig.  Increasing o2 needs correlating w/ the initiation of clevidipine   for MAP goals -noted significant improvement in saturation after weaning nicardipine  to off w/ sats steadily rising  back up to >97% -suspect pulmonary vasodilation and shunt effect from nicardipine  Plan Stopped Clevidipine  Spoke to surgical team. Try nicardipine  if needed. Hope we can avoid though as also calcium  channel blocker so would still be at risk

## 2024-09-01 NOTE — Discharge Summary (Signed)
 Physician Discharge Summary  Patient ID: [name] MRN: [id number] DOB/AGE: Jun 18, 1956 68 y.o.  Admit date: 08/29/2024 Discharge date: 09/05/2024  Admission Diagnoses:  Acute NSTEMI Multi-vessel coronary artery disease Type 2 diabetes mellitis Hypertension CKD, stage III Dyslipidemia History of CVA   Discharge Diagnoses:   Acute NSTEMI Multi-vessel coronary artery disease Type 2 diabetes mellitis Hypertension CKD, stage III Dyslipidemia History of CVA S/P coronary artery bypass grafting Expected acute blood loss anemia  Discharged Condition: good  Referring:  End, Lonni, MD Primary Care: Severa Rock HERO, FNP  History of Present Illness:  Mr. Arnesen is a pleasant 68 year old man who presents with NSTEMI, found to have multivessel coronary artery disease on LHC.  He started having chest pain after unloading his car after a trip.  At that time, his BP was 220s/100s so he called 911.  He is a good operative candidate.  He exercises several times a week and works as a engineer, water.  He is a never smoker and does not drink alcohol. He lives in a house with his wife and is independent in all ADLs.  His TTE demonstrated normal Biventricular function and no valvular disease.     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  He is right handed.  He has not had chest surgery or radiation.  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   I discussed the general nature of the procedure, including the need for general anesthesia, the incisions to be used, the use of cardiopulmonary bypass, and the use of temporary pacemaker wires and drainage tubes postoperatively with Mr. Bordonaro.  We discussed the expected hospital stay, overall recovery and short and long term outcomes. I  informed him of the indications, risks, benefits and alternatives.   He understands the risks include, but are not limited to death, stroke, MI, DVT/PE, bleeding, possible need for transfusion, infections, cardiac arrhythmias, as well as other organ system dysfunction including respiratory (eg: prolonged ventilation), renal, or GI complications.    We will obtain vein mapping to look at this legs given the varicosities.  I explained there is a chance we may need to use his left radial artery if he does not have sufficient or appropriate venous conduit.  Plan for CABG tomorrow.   Hospital Course: Mr. Stockley remained stable on the heparin  infusion.  He decided to proceed with surgery and was taken to the OR on 09/01/24 where CABG x 4 was carried out, utilizing LIMA to LAD, SVG to PDA, SVG to OM, and SVG to Ramus Intermediate.  He also underwent endoscopic harvest of left leg greater saphenous vein.  He tolerated the procedure without difficulty and was taken to the SICU in stable condition.  He was extubated the evening of surgery without difficulty.  The patient did not require inotropic or vasopressor support post operatively.  He was mobilized on the first post-op day.  The mediastinal tubes and pacer wires were removed on the second post-op day and he was transferred to 4E Progressive Care.  The patient's chest tube output remained low with removal of chest tube on 11/29.  Follow up chest xray showed no pneumothorax, improvement of atelectasis, and trace left pleural effusion.  He was started on IV Lasix  to facilitate diuresis.  His diabetes is poorly controlled with preoperative A1c of 8.5.  He has been trying to control with diet which is has not been effective.  He will need close follow up with primary care physician.  He was started on Metformin  500 mg BID to aide in management of this.  He remains in NSR.  He is ambulating without difficulty.  His surgical incisions are healing without evidence of  infection.  He is stable for discharge home today.   Consults: pulmonary/intensive care  Significant Diagnostic Studies:   EXAM: 1 VIEW XRAY OF THE CHEST 09/03/2024 05:37:00 AM   COMPARISON: Portable chest yesterday at 5:25 am.   CLINICAL HISTORY: Status post cardiac surgery   FINDINGS: LINES, TUBES AND DEVICES: Pericardial and mediastinal drains remain in place. There is a right IJ catheter through an introducer sheath with the tip in the upper SVC.   LUNGS AND PLEURA: No measurable pneumothorax. Linear and patchy left basilar opacity appears similar and could all be atelectasis or could be a combination of atelectasis and consolidation.   No new or worsening opacity is seen. The rest of the lungs are generally clear apart from minimal pleural effusions.   HEART AND MEDIASTINUM: Stable cardiomegaly. Stable mediastinal widening with aortic atherosclerosis.   BONES AND SOFT TISSUES: No acute osseous abnormality. Sternotomy and CABG changes are again noted.   IMPRESSION: 1. Stable cardiomegaly and mediastinal widening with aortic atherosclerosis. 2. Unchanged left basilar opacity, favored atelectasis versus atelectasis with superimposed consolidation. 3. Minimal pleural effusions.   Electronically signed by: Francis Quam MD 09/03/2024 06:58 AM EST RP Workstation: [id number]  Treatments: Surgery 09/01/24  Discharge Exam: Blood pressure (!) 151/85, pulse 89, temperature 98.1 F (36.7 C), temperature source Oral, resp. rate 16, height 5' 7 (1.702 m), weight 89.5 kg, SpO2 97%.  General appearance: alert, cooperative, and no distress Heart: regular rate and rhythm Lungs: diminished breath sounds LLL Abdomen: soft, non-tender; bowel sounds normal; no masses,  no organomegaly Extremities: edema trace Wound: clean and dry  Disposition: Discharge disposition: 01-Home or Self Care       Discharge Instructions     AMB Referral to [facility]   Complete  by: As directed    Lipid clinical referral to [facility] - not interested in statin therapy   Reason For Referral: Lipids   Amb Referral to Cardiac Rehabilitation   Complete by: As directed    Zelda Salmon   Diagnosis: CABG   CABG X ___: 4   After initial evaluation and assessments completed: Virtual Based Care may be provided alone or in conjunction with Phase 2 Cardiac Rehab based on patient barriers.: Yes   Intensive Cardiac Rehabilitation (ICR) [facility] location only OR Traditional Cardiac Rehabilitation (TCR) *If criteria for ICR are not met will enroll in TCR ([facility] only): Yes      Allergies as of 09/05/2024   No Known Allergies      Medication List     STOP taking these medications    enalapril  2.5 MG tablet Commonly known as: VASOTEC        TAKE these medications    Accu-Chek FastClix Lancets Misc CHECK BS DAILY DX E11.9   Accu-Chek Guide Me w/Device Kit CHECK BS DAILY DX E11.9   acetaminophen  500 MG tablet Commonly known as: TYLENOL  Take 1-2 tablets (500-1,000 mg total) by mouth every 6 (six) hours as needed.   albuterol  108 (90 Base) MCG/ACT inhaler Commonly known as: VENTOLIN  HFA Inhale 2 puffs into the lungs every 6 (  six) hours as needed for shortness of breath.   aspirin  81 MG chewable tablet Chew 81 mg by mouth daily.   clopidogrel  75 MG tablet Commonly known as: Plavix  Take 1 tablet (75 mg total) by mouth daily.   Dexcom G7 Sensor Misc 1 Device by Does not apply route every 14 (fourteen) days.   FREESTYLE LITE test strip Generic drug: glucose blood Check BS daily Dx E11.9   furosemide  40 MG tablet Commonly known as: LASIX  Take 1 tablet (40 mg total) by mouth daily.   metFORMIN  500 MG tablet Commonly known as: GLUCOPHAGE  Take 1 tablet (500 mg total) by mouth 2 (two) times daily with a meal.   methocarbamol  500 MG tablet Commonly known as: ROBAXIN  Take 1 tablet (500 mg total) by mouth every 8 (eight) hours as needed for muscle spasms.    metoprolol  tartrate 25 MG tablet Commonly known as: LOPRESSOR  Take 1 tablet (25 mg total) by mouth 2 (two) times daily.   potassium chloride  SA 20 MEQ tablet Commonly known as: KLOR-CON  M Take 1 tablet (20 mEq total) by mouth daily.   rosuvastatin  20 MG tablet Commonly known as: CRESTOR  Take 1 tablet (20 mg total) by mouth daily at 6 PM.   traMADol  50 MG tablet Commonly known as: ULTRAM  Take 1 tablet (50 mg total) by mouth every 4 (four) hours as needed for moderate pain (pain score 4-6).        Follow-up Information     Rakes, Rock HERO, FNP. Schedule an appointment as soon as possible for a visit.   Specialty: Family Medicine Why: Please make an appointment with your primary care provider within 2 weeks of discharge from the hospital. Contact information: [address] [place] [address] [phone]         Daniel Con RAMAN, MD. Go on 09/23/2024.   Specialty: Cardiothoracic Surgery Why: Your appontment is at 12 Noon.  Please arrive about 45 minutes early for a chest x-ray to be performed on the second floor of the same building. Contact information: [address] 4th Floor [place] [address] [phone]         Rana Lum CROME, NP. Go on 10/01/2024.   Specialty: Cardiology Why: Your appointment is at 10:05am. Contact information: [address] [place] [address] [phone]                 The patient has been discharged on:   1.Beta Blocker:  Yes [ x  ]                              No   [   ]                              If No, reason:  2.Ace Inhibitor/ARB: Yes [   ]                                     No  [    ]                                     If No, reason:  3.Statin:   Yes [ x  ]  No  [   ]                  If No, reason:  4.Ecasa:  Yes  [ x ]                  No   [   ]                  If No, reason:  5. ACS on Admission? Yes  P2Y12 Inhibitor:  Yes  [ y ]                                 No  [  ]    Signed: Analyah Mcconnon 09/05/2024, 10:11 AM

## 2024-09-01 NOTE — Progress Notes (Signed)
 Brief Progress Note  No issues overnight. No chest pain, vitals remain stable.  Vitals:   09/01/24 0418 09/01/24 0647  BP:  135/79  Pulse:  74  Resp:  18  Temp: 98.5 F (36.9 C) 98.4 F (36.9 C)  SpO2:  95%   Exam: Resting comfortably NSR Unlabored breathing on RA No leg swelling  Plan:  OR today for CABG with possible left radial artery harvest.  Con Clunes, MD Cardiothoracic Surgery Pager: [phone]

## 2024-09-01 NOTE — Brief Op Note (Signed)
 08/29/2024 - 09/01/2024  10:02 AM  PATIENT:  Bryan Shepard Bryan Shepard.  68 y.o. male  PRE-OPERATIVE DIAGNOSIS:  CORONARY ARTERY DISEASE  POST-OPERATIVE DIAGNOSIS:  CORONARY ARTERY DISEASE  PROCEDURE:  Procedure(s): CORONARY ARTERY BYPASS GRAFTING X 4, USING LEFT INTERAL MAMMARY ARTERY AND ENDOSCOPIC HARVESTED LEFT GREATER SAPHENOUS VEIN (N/A) ECHOCARDIOGRAM, TRANSESOPHAGEAL, INTRAOPERATIVE (N/A) Vein harvest time: Vein prep time: LIMA-LAD, SVG-RAMUS, SVG-OM, SVG-PD SURGEON:  Surgeons and Role:    * Su, Con RAMAN, MD - Primary    * Shyrl Linnie KIDD, MD - Assisting  PHYSICIAN ASSISTANT: Keerthi Hazell PA-C  ASSISTANTS: LUKE STACKS RNFA   ANESTHESIA:   general  EBL:  812 mL   BLOOD ADMINISTERED:none  DRAINS: LEFT PLEURAL AND MEDIASTINAL CHEST TUBES   LOCAL MEDICATIONS USED:  NONE  SPECIMEN:  No Specimen  DISPOSITION OF SPECIMEN:  N/A  COUNTS:  YES  TOURNIQUET:  * No tourniquets in log *  DICTATION: .Dragon Dictation  PLAN OF CARE: Admit to inpatient   PATIENT DISPOSITION:  ICU - intubated and hemodynamically stable.   Delay start of Pharmacological VTE agent (>24hrs) due to surgical blood loss or risk of bleeding: yes  COMPLICATIONS: NO KNOWN

## 2024-09-01 NOTE — Plan of Care (Signed)

## 2024-09-02 ENCOUNTER — Inpatient Hospital Stay (HOSPITAL_COMMUNITY)

## 2024-09-02 DIAGNOSIS — E119 Type 2 diabetes mellitus without complications: Secondary | ICD-10-CM | POA: Diagnosis not present

## 2024-09-02 DIAGNOSIS — I214 Non-ST elevation (NSTEMI) myocardial infarction: Secondary | ICD-10-CM | POA: Diagnosis not present

## 2024-09-02 DIAGNOSIS — Z951 Presence of aortocoronary bypass graft: Secondary | ICD-10-CM | POA: Diagnosis not present

## 2024-09-02 DIAGNOSIS — E782 Mixed hyperlipidemia: Secondary | ICD-10-CM | POA: Diagnosis not present

## 2024-09-02 LAB — BASIC METABOLIC PANEL WITH GFR
Anion gap: 10 (ref 5–15)
Anion gap: 9 (ref 5–15)
BUN: 16 mg/dL (ref 8–23)
BUN: 19 mg/dL (ref 8–23)
CO2: 21 mmol/L — ABNORMAL LOW (ref 22–32)
CO2: 24 mmol/L (ref 22–32)
Calcium: 7.1 mg/dL — ABNORMAL LOW (ref 8.9–10.3)
Calcium: 7.5 mg/dL — ABNORMAL LOW (ref 8.9–10.3)
Chloride: 104 mmol/L (ref 98–111)
Chloride: 99 mmol/L (ref 98–111)
Creatinine, Ser: 1.31 mg/dL — ABNORMAL HIGH (ref 0.61–1.24)
Creatinine, Ser: 1.42 mg/dL — ABNORMAL HIGH (ref 0.61–1.24)
GFR, Estimated: 60 mL/min — ABNORMAL LOW (ref >60)
GFR, Estimated: 54 mL/min — ABNORMAL LOW (ref >60)
Glucose, Bld: 134 mg/dL — ABNORMAL HIGH (ref 70–99)
Glucose, Bld: 202 mg/dL — ABNORMAL HIGH (ref 70–99)
Potassium: 4.2 mmol/L (ref 3.5–5.1)
Potassium: 4.5 mmol/L (ref 3.5–5.1)
Sodium: 135 mmol/L (ref 135–145)
Sodium: 132 mmol/L — ABNORMAL LOW (ref 135–145)

## 2024-09-02 LAB — CBC
HCT: 34 % — ABNORMAL LOW (ref 39.0–52.0)
HCT: 36.2 % — ABNORMAL LOW (ref 39.0–52.0)
Hemoglobin: 11.5 g/dL — ABNORMAL LOW (ref 13.0–17.0)
Hemoglobin: 12.1 g/dL — ABNORMAL LOW (ref 13.0–17.0)
MCH: 29 pg (ref 26.0–34.0)
MCH: 28.8 pg (ref 26.0–34.0)
MCHC: 33.8 g/dL (ref 30.0–36.0)
MCHC: 33.4 g/dL (ref 30.0–36.0)
MCV: 85.9 fL (ref 80.0–100.0)
MCV: 86.2 fL (ref 80.0–100.0)
Platelets: 181 K/uL (ref 150–400)
Platelets: 204 K/uL (ref 150–400)
RBC: 3.96 MIL/uL — ABNORMAL LOW (ref 4.22–5.81)
RBC: 4.2 MIL/uL — ABNORMAL LOW (ref 4.22–5.81)
RDW: 13.1 % (ref 11.5–15.5)
RDW: 13.2 % (ref 11.5–15.5)
WBC: 17.1 K/uL — ABNORMAL HIGH (ref 4.0–10.5)
WBC: 18.3 K/uL — ABNORMAL HIGH (ref 4.0–10.5)
nRBC: 0 % (ref 0.0–0.2)
nRBC: 0 % (ref 0.0–0.2)

## 2024-09-02 LAB — GLUCOSE, CAPILLARY
Glucose-Capillary: 105 mg/dL — ABNORMAL HIGH (ref 70–99)
Glucose-Capillary: 120 mg/dL — ABNORMAL HIGH (ref 70–99)
Glucose-Capillary: 123 mg/dL — ABNORMAL HIGH (ref 70–99)
Glucose-Capillary: 132 mg/dL — ABNORMAL HIGH (ref 70–99)
Glucose-Capillary: 134 mg/dL — ABNORMAL HIGH (ref 70–99)
Glucose-Capillary: 140 mg/dL — ABNORMAL HIGH (ref 70–99)
Glucose-Capillary: 145 mg/dL — ABNORMAL HIGH (ref 70–99)
Glucose-Capillary: 179 mg/dL — ABNORMAL HIGH (ref 70–99)
Glucose-Capillary: 180 mg/dL — ABNORMAL HIGH (ref 70–99)
Glucose-Capillary: 188 mg/dL — ABNORMAL HIGH (ref 70–99)
Glucose-Capillary: 189 mg/dL — ABNORMAL HIGH (ref 70–99)
Glucose-Capillary: 196 mg/dL — ABNORMAL HIGH (ref 70–99)
Glucose-Capillary: 230 mg/dL — ABNORMAL HIGH (ref 70–99)

## 2024-09-02 LAB — BPAM PLATELET PHERESIS
Blood Product Expiration Date: 202511282359
ISSUE DATE / TIME: 202511261404
Unit Type and Rh: 6200

## 2024-09-02 LAB — MAGNESIUM
Magnesium: 2.8 mg/dL — ABNORMAL HIGH (ref 1.7–2.4)
Magnesium: 2.9 mg/dL — ABNORMAL HIGH (ref 1.7–2.4)

## 2024-09-02 LAB — PREPARE PLATELET PHERESIS: Unit division: 0

## 2024-09-02 LAB — ECHO INTRAOPERATIVE TEE
Height: 67 in
Weight: 3188.73 [oz_av]

## 2024-09-02 MED ORDER — OXYCODONE HCL 5 MG PO TABS
5.0000 mg | ORAL_TABLET | ORAL | Status: DC | PRN
  Administered 2024-09-03 – 2024-09-04 (×2): 5 mg via ORAL
  Filled 2024-09-02 (×2): qty 1

## 2024-09-02 MED ORDER — INSULIN GLARGINE-YFGN 100 UNIT/ML ~~LOC~~ SOLN
14.0000 [IU] | Freq: Every day | SUBCUTANEOUS | Status: DC
  Administered 2024-09-02 – 2024-09-05 (×4): 14 [IU] via SUBCUTANEOUS
  Filled 2024-09-02 (×4): qty 0.14

## 2024-09-02 MED ORDER — CYCLOBENZAPRINE HCL 10 MG PO TABS
5.0000 mg | ORAL_TABLET | Freq: Three times a day (TID) | ORAL | Status: DC | PRN
  Administered 2024-09-02 – 2024-09-04 (×4): 5 mg via ORAL
  Filled 2024-09-02 (×4): qty 1

## 2024-09-02 MED ORDER — ROSUVASTATIN CALCIUM 20 MG PO TABS
20.0000 mg | ORAL_TABLET | Freq: Every day | ORAL | Status: DC
  Administered 2024-09-02 – 2024-09-04 (×3): 20 mg via ORAL
  Filled 2024-09-02 (×3): qty 1

## 2024-09-02 MED ORDER — ORAL CARE MOUTH RINSE: 15.0000 mL | OROMUCOSAL | Status: DC | PRN

## 2024-09-02 MED ORDER — INSULIN ASPART 100 UNIT/ML IJ SOLN
0.0000 [IU] | INTRAMUSCULAR | Status: DC
  Administered 2024-09-02 (×2): 4 [IU] via SUBCUTANEOUS
  Administered 2024-09-02: 8 [IU] via SUBCUTANEOUS
  Administered 2024-09-02 – 2024-09-03 (×2): 2 [IU] via SUBCUTANEOUS
  Filled 2024-09-02: qty 4
  Filled 2024-09-02 (×2): qty 2
  Filled 2024-09-02: qty 4
  Filled 2024-09-02: qty 8

## 2024-09-02 MED ORDER — FUROSEMIDE 10 MG/ML IJ SOLN
40.0000 mg | Freq: Once | INTRAMUSCULAR | Status: AC
  Administered 2024-09-02: 40 mg via INTRAVENOUS
  Filled 2024-09-02: qty 4

## 2024-09-02 NOTE — Progress Notes (Signed)
 NAME:  Bryan Ringwald., MRN:  [id number], DOB:  1956-09-07, LOS: 4 ADMISSION DATE:  08/29/2024, CONSULTATION DATE:  11/26 REFERRING MD:  SU, CHIEF COMPLAINT:  post-CABG   History of Present Illness:   68 year old male w/Hx as outlined below. Presented to ER 11/23 w/cc: chest pain X1-2d w/ associated SOB. Initial ECG NSR w/ PACs but no ischemia,+ rising trops w/on-going pain concerning for NSTEMI. Went to cardiac cath 11/24 which showed severe 3 CAD (90 and 80% prox/mid LAD, 90-95% bifurcation of Ramous intermedius, 95% OM1 and total occlusion of distal RCA. LVEDP noted at . Placed on ASA, NTG paste and isosorbide  started. ECHO EF60-65%mild LVH, grade I diastolic dysfxn, nml RV fxn, TAPSE 23mm Cardiothoracic surgery was consulted for CABG and pt was brought to OR on 11/26 with Dr Daniel. Critical care consulted to assist w/post-operative critical care management   OR report Pump time: 2h Clamp time: 1h 50 min  EBL: 812 Cell saver 460 Products: 300 ml PLTs ~ 2 liters crystalloid  Post ECHO EF still nml, mild MR Pertinent  Medical History  OSA, dyslipidemia, CKD stage III, type II DM, CVA x 2   Significant Hospital Events: Including procedures, antibiotic start and stop dates in addition to other pertinent events   11/23 admitted w/CP rising trops c/w NSTEMI 11/24 left heart cath svr 3V disease. Mildly elevated LVEDP . ECHO nml EF, gd 1 diastolic dysfxn. LVH 11/26 CABG 3V (su)  Interim History / Subjective:  NAEO   WBC up to 17  Cr bump to 1.3   Objective    Blood pressure 126/77, pulse 86, temperature 98.6 F (37 C), temperature source Oral, resp. rate 14, height 5' 7 (1.702 m), weight 95.2 kg, SpO2 95%. CVP:  [3 mmHg-72 mmHg] 8 mmHg CO:  [3.5 L/min-7.4 L/min] 6.9 L/min CI:  [1.7 L/min/m2-3.7 L/min/m2] 3.4 L/min/m2  Vent Mode: SIMV;PSV;PRVC FiO2 (%):  [50 %-60 %] 60 % Set Rate:  [16 bmp] 16 bmp Vt Set:  [530 mL] 530 mL PEEP:  [5 cmH20-10 cmH20] 5  cmH20 Pressure Support:  [10 cmH20] 10 cmH20   Intake/Output Summary (Last 24 hours) at 09/02/2024 1053 Last data filed at 09/02/2024 0900 Gross per 24 hour  Intake 5677.46 ml  Output 3032 ml  Net 2645.46 ml   Filed Weights   09/01/24 0418 09/01/24 0649 09/02/24 0500  Weight: 90.4 kg 90.4 kg 95.2 kg    Examination: General: wdwn middle aged M NAD in recliner  HENT: NCAT pink mm  Lungs: CTAb  Cardiovascular: rr s1s2. Midline sternal dressing intact. Small volume serosang chest tube output.  Abdomen: soft ndnt  Extremities: no acute joint deformity. LLE vein harvest site.  Neuro:  AAOx4  GU: foley   Resolved problem list   Thrombocytopenia  Assessment and Plan   NSTEMI mvCAD s/p CABG x3  Hx HTN  Hx HLD  Mild ascending aorta dilatation  Plan - post op, L/T/D/W per CVTS -- plan to cap wires and dc art line  -defer chem vte ppx  -transition off insulin  gtt -multimodal analgesia -ancef   -mobility  -possible diuresis 11/27 afternoon -asa statin beta blocker  -annual CTA   AKI  Plan -keep foley -follow renal indices uop -as above possible diuresis 11/27   DM2  Plan -transition off insulin  gtt   Hx CVA x2 -some residual dysarthria  Plan -asa statin   Hx OSA  Plan -noct CPAP    Labs   CBC: Recent Labs  Lab 08/31/24 0342 09/01/24 0315 09/01/24 0810 09/01/24 1240 09/01/24 1306 09/01/24 1554 09/01/24 1558 09/01/24 1849 09/01/24 1935 09/01/24 2033 09/01/24 2119 09/02/24 0443  WBC 10.1 9.7  --   --   --  10.6*  --   --   --   --  12.4* 17.1*  HGB 13.5 13.2   < > 10.0*   < > 10.7*   < > 8.5* 9.5* 9.2* 11.3* 11.5*  HCT 39.9 38.9*   < > 29.1*   < > 31.0*   < > 25.0* 28.0* 27.0* 33.1* 34.0*  MCV 84.4 84.4  --   --   --  84.7  --   --   --   --  84.9 85.9  PLT 168 158  --  150  --  128*  --   --   --   --  162 181   < > = values in this interval not displayed.    Basic Metabolic Panel: Recent Labs  Lab 08/30/24 0535 08/31/24 0342  09/01/24 0315 09/01/24 0810 09/01/24 1239 09/01/24 1306 09/01/24 1337 09/01/24 1404 09/01/24 1409 09/01/24 1849 09/01/24 1935 09/01/24 2033 09/01/24 2119 09/02/24 0443  NA 138 137 134*   < > 135   < > 136 138   < > 142 143 140 138 135  K 3.9 3.8 3.8   < > 4.7   < > 4.9 4.5   < > 3.9 3.6 3.9 4.2 4.2  CL 105 105 104   < > 104  --  103 104  --   --   --   --  106 104  CO2 23 22 20*  --   --   --   --   --   --   --   --   --  22 21*  GLUCOSE 188* 163* 153*   < > 134*  --  149* 160*  --   --   --   --  121* 134*  BUN 14 16 17    < > 15  --  16 15  --   --   --   --  15 16  CREATININE 1.05 1.20 1.21   < > 1.00  --  1.10 1.00  --   --   --   --  1.18 1.31*  CALCIUM  8.5* 8.6* 8.5*  --   --   --   --   --   --   --   --   --  6.8* 7.1*  MG 2.1 1.6* 2.2  --   --   --   --   --   --   --   --   --  3.5* 2.8*  PHOS 2.6  --   --   --   --   --   --   --   --   --   --   --   --   --    < > = values in this interval not displayed.   GFR: Estimated Creatinine Clearance: 60.1 mL/min (A) (by C-G formula based on SCr of 1.31 mg/dL (H)). Recent Labs  Lab 09/01/24 0315 09/01/24 1554 09/01/24 1644 09/01/24 2119 09/02/24 0443  WBC 9.7 10.6*  --  12.4* 17.1*  LATICACIDVEN  --   --  1.3  --   --     Liver Function Tests: Recent Labs  Lab 09/01/24 0315  AST 23  ALT 26  ALKPHOS 55  BILITOT 1.2  PROT 6.1*  ALBUMIN  3.2*   No results for input(s): LIPASE, AMYLASE in the last 168 hours. No results for input(s): AMMONIA in the last 168 hours.  ABG    Component Value Date/Time   PHART 7.406 09/01/2024 2033   PCO2ART 32.3 09/01/2024 2033   PO2ART 87 09/01/2024 2033   HCO3 20.3 09/01/2024 2033   TCO2 21 (L) 09/01/2024 2033   ACIDBASEDEF 4.0 (H) 09/01/2024 2033   O2SAT 97 09/01/2024 2033     Coagulation Profile: Recent Labs  Lab 08/29/24 1813 09/01/24 1554  INR 1.0 1.4*    Cardiac Enzymes: No results for input(s): CKTOTAL, CKMB, CKMBINDEX, TROPONINI in the last 168  hours.  HbA1C: HB A1C (BAYER DCA - WAIVED)  Date/Time Value Ref Range Status  04/06/2024 11:16 AM 8.8 (H) 4.8 - 5.6 % Final    Comment:             Prediabetes: 5.7 - 6.4          Diabetes: >6.4          Glycemic control for adults with diabetes: <7.0   01/01/2024 11:03 AM 6.6 (H) 4.8 - 5.6 % Final    Comment:             Prediabetes: 5.7 - 6.4          Diabetes: >6.4          Glycemic control for adults with diabetes: <7.0    Hgb A1c MFr Bld  Date/Time Value Ref Range Status  08/30/2024 05:35 AM 8.4 (H) 4.8 - 5.6 % Final    Comment:    (NOTE)         Prediabetes: 5.7 - 6.4         Diabetes: >6.4         Glycemic control for adults with diabetes: <7.0     CBG: Recent Labs  Lab 09/02/24 0227 09/02/24 0441 09/02/24 0603 09/02/24 0702 09/02/24 0911  GLUCAP 105* 120* 132* 134* 196*    Mod MDM  Ronnald Gave MSN, AGACNP-BC [facility] Amion for pager  09/02/2024, 10:53 AM

## 2024-09-02 NOTE — Plan of Care (Signed)
  Problem: Fluid Volume: Goal: Ability to maintain a balanced intake and output will improve Outcome: Progressing   Problem: Nutritional: Goal: Maintenance of adequate nutrition will improve Outcome: Progressing   Problem: Tissue Perfusion: Goal: Adequacy of tissue perfusion will improve Outcome: Progressing   Problem: Activity: Goal: Risk for activity intolerance will decrease Outcome: Progressing

## 2024-09-02 NOTE — Progress Notes (Signed)
 12-lead EKG performed.  Critical value noted.  RN [facility] notified.

## 2024-09-02 NOTE — Progress Notes (Signed)
   09/02/24 2013  BiPAP/CPAP/SIPAP  Reason BIPAP/CPAP not in use Other(comment) (Pt states his wife is bringing his home CPAP tomorrow and declined to wear hopsital provided CPAP tonight. RT told patient to have RN call RT if patient changes his mind. RT available as needed.)

## 2024-09-02 NOTE — Progress Notes (Signed)
 1 Day Post-Op Procedure(s) (LRB): CORONARY ARTERY BYPASS GRAFTING X 4, USING LEFT INTERAL MAMMARY ARTERY AND ENDOSCOPIC HARVESTED LEFT GREATER SAPHENOUS VEIN (N/A) ECHOCARDIOGRAM, TRANSESOPHAGEAL, INTRAOPERATIVE (N/A) Subjective: Doing well this morning Extubated No pressors, hemodynamically stable UOP good, CT output minimal Complaining of persistent low back pain that was present prior to surgery  Objective: Vital signs in last 24 hours: BP 116/77 (BP Location: Right Arm)   Pulse 83   Temp 98.6 F (37 C) (Oral)   Resp (!) 9   Ht 5' 7 (1.702 m)   Wt 95.2 kg   SpO2 96%   BMI 32.87 kg/m  Filed Weights   09/01/24 0418 09/01/24 0649 09/02/24 0500  Weight: 90.4 kg 90.4 kg 95.2 kg    Hemodynamic parameters for last 24 hours: CVP:  [3 mmHg-72 mmHg] 8 mmHg CO:  [3.5 L/min-7.4 L/min] 6.9 L/min CI:  [1.7 L/min/m2-3.7 L/min/m2] 3.4 L/min/m2  Intake/Output from previous day: 11/26 0701 - 11/27 0700 In: 5621 [P.O.:240; I.V.:3067; Aonni:235; IV Piggyback:1550] Out: 3107 [Urine:1765; Blood:812; Chest Tube:530] Intake/Output this shift: Total I/O In: 32.2 [I.V.:32.2] Out: 115 [Urine:75; Chest Tube:40]  Physical Exam: General - Resting comfortably in bed CV - RRR Resp - Unlabored on 4L [place] Abd - Soft, ND/NT Ext - Minimal edema, moderate amount of ecchymosis throughout the left vein harvest site, non-tender  Lab Results:    Latest Ref Rng & Units 09/02/2024    4:43 AM 09/01/2024    9:19 PM 09/01/2024    8:33 PM  CBC  WBC 4.0 - 10.5 K/uL 17.1  12.4    Hemoglobin 13.0 - 17.0 g/dL 88.4  88.6  9.2   Hematocrit 39.0 - 52.0 % 34.0  33.1  27.0   Platelets 150 - 400 K/uL 181  162        Latest Ref Rng & Units 09/02/2024    4:43 AM 09/01/2024    9:19 PM 09/01/2024    8:33 PM  CMP  Glucose 70 - 99 mg/dL 865  878    BUN 8 - 23 mg/dL 16  15    Creatinine 9.38 - 1.24 mg/dL 8.68  8.81    Sodium 864 - 145 mmol/L 135  138  140   Potassium 3.5 - 5.1 mmol/L 4.2  4.2  3.9    Chloride 98 - 111 mmol/L 104  106    CO2 22 - 32 mmol/L 21  22    Calcium  8.9 - 10.3 mg/dL 7.1  6.8      CXR: No effusion or PTX  Assessment/Plan: S/P Procedure(s) (LRB): CORONARY ARTERY BYPASS GRAFTING X 4, USING LEFT INTERAL MAMMARY ARTERY AND ENDOSCOPIC HARVESTED LEFT GREATER SAPHENOUS VEIN (N/A) ECHOCARDIOGRAM, TRANSESOPHAGEAL, INTRAOPERATIVE (N/A) POD1 s/p CABG x 4, doing well NEURO- intact  Pain control PRN CV- in SR around 80s bpm             Cap pacing wires RESP- Continue IS, pulm hygiene, ambulation  Keep Chest tubes for today RENAL- creatinine and lytes Ok  Weight up 10 lbs             Maybe Lasix  this afternoon  Foley will stay GI- tolerating clears             Advance diet  BM: None since surgery Endo- BG well controlled, history of DM (A1c 8.4%) Transition to ISS ID- NAI DVT ppx - SCD + hold off on DVT ppx for now given leg hematoma. Will need plavix at discharge  Dispo: ICU  LOS: 4 days    Con Bryan Shepard 09/02/2024

## 2024-09-02 NOTE — Progress Notes (Signed)
 Patient Name: Bryan Shepard. Date of Encounter: 09/02/2024 [facility] Cardiologist: None   Interval Summary  .    No chest pain Has back pain, that was present even before his presentation. Has pain medications prn  Cr 1.31  Vital Signs .    Vitals:   09/02/24 0300 09/02/24 0344 09/02/24 0400 09/02/24 0500  BP:      Pulse: 85 84 84   Resp: 16 16 16    Temp:   99 F (37.2 C)   TempSrc:      SpO2: 97% 96% 96%   Weight:    95.2 kg  Height:        Intake/Output Summary (Last 24 hours) at 09/02/2024 9378 Last data filed at 09/02/2024 0600 Gross per 24 hour  Intake 5588.79 ml  Output 3022 ml  Net 2566.79 ml      09/02/2024    5:00 AM 09/01/2024    6:49 AM 09/01/2024    4:18 AM  Last 3 Weights  Weight (lbs) 209 lb 14.1 oz 199 lb 4.7 oz 199 lb 4.7 oz  Weight (kg) 95.2 kg 90.4 kg 90.4 kg      Telemetry/ECG    09/02/2024 - Personally Reviewed No significant arrhythmia  Echocardiogram 08/30/2024: 1. Left ventricular ejection fraction, by estimation, is 60 to 65%. The  left ventricle has normal function. Left ventricular endocardial border  not optimally defined to evaluate regional wall motion. There is mild left  ventricular hypertrophy. Left  ventricular diastolic parameters are consistent with Grade I diastolic  dysfunction (impaired relaxation).   2. Right ventricular systolic function is normal. The right ventricular  size is normal. Tricuspid regurgitation signal is inadequate for assessing  PA pressure.   3. The mitral valve is normal in structure. Trivial mitral valve  regurgitation. No evidence of mitral stenosis.   4. The aortic valve was not well visualized. Aortic valve regurgitation  is not visualized. No aortic stenosis is present.   5. The inferior vena cava is normal in size with greater than 50%  respiratory variability, suggesting right atrial pressure of 3 mmHg.   Comparison(s): No prior Echocardiogram.   Coronary angiogram  08/30/2024:     Prox RCA lesion is 40% stenosed.   Dist RCA lesion is 100% stenosed.   Ramus lesion is 95% stenosed with 90% stenosed side branch in Lat Ramus.   1st Mrg lesion is 95% stenosed.   RPAV lesion is 100% stenosed.   RPDA lesion is 100% stenosed.   Prox LAD lesion is 90% stenosed.   Mid LAD lesion is 80% stenosed.   LV end diastolic pressure is mildly elevated.   There is no aortic valve stenosis.   Anticipated discharge date to be determined.   Continue aspirin  81 mg daily.  Defer P2Y12 inhibitor pending cardiac surgery consultation.  Resume heparin  infusion 2 hours after TR band removal.   Conclusions: Severe three-vessel coronary artery disease, as detailed below, including sequential 90% and 80% proximal/mid LAD stenoses, 90-95% stenoses of bifurcating ramus intermedius, 95% stenosis of OM1, and chronic total occlusion of distal RCA. Mildly elevated left ventricular filling pressure (LVEDP 20 mmHg).   Recommendations: Inpatient cardiac surgery consultation for CABG. Resume heparin  infusion 2 hours after TR band has been removed. Aggressive secondary prevention of coronary artery disease. Transition from nitroglycerin  paste to isosorbide  mononitrate for antianginal therapy.  Physical Exam .   Physical Exam Vitals and nursing note reviewed.  Constitutional:      General:  He is not in acute distress. Neck:     Vascular: No JVD.  Cardiovascular:     Rate and Rhythm: Normal rate and regular rhythm.     Heart sounds: Normal heart sounds. No murmur heard.    Comments: Chest tube in place Pulmonary:     Effort: Pulmonary effort is normal.     Breath sounds: Normal breath sounds. No wheezing or rales.  Musculoskeletal:     Right lower leg: No edema.     Left lower leg: No edema.      Assessment & Plan .     68 y/o male w/hypertension, hyperlipidemia, type 2 DM, h/o stroke. NSTEMI now s/op CABG  NSTEMI: Presentation on 08/30/2024. Cath showed severe multivessel  CAD. Now s/p CABGX4 (LIMA-LAD, SVG-OM, SVG-ramus, SVG-PDA) on 11/26. Scheduled to start Aspirin  325 mg this morning. Consider also adding Plavix post op given presentation w/NSTEMI. Scheduled to start PO metoprolol  tartrate 12.5 mg bid this morning.  Hypertensive episodes around the time of extubation on 11/26 requiring nicardipine  infusion, now settled.   AKI: Cr 1.31, baseline 1.0. Urine output 35/hr. Mild post op AKI, may be related to blood pressure changes. Encourage increasing oral intake today. If urine output remains >50/hr or if Cr continues to climb, could add IV fluids.   Mixed hyperlipidemia: LDL 99 at baseline, unclear if he was on statin before. Added Crestor  20 mg daily.   Type 2 DM: A1C 8.4%. On insulin  drip. Had metformin  prescribed by PCP but had not started it. Could start before discharge, provided Cr does not climb further.   For questions or updates, please contact [facility] Please consult [email or web address] for contact info under        Signed, Newman JINNY Lawrence, MD

## 2024-09-03 ENCOUNTER — Inpatient Hospital Stay (HOSPITAL_COMMUNITY)

## 2024-09-03 ENCOUNTER — Encounter (HOSPITAL_COMMUNITY): Payer: Self-pay

## 2024-09-03 LAB — GLUCOSE, CAPILLARY
Glucose-Capillary: 97 mg/dL (ref 70–99)
Glucose-Capillary: 123 mg/dL — ABNORMAL HIGH (ref 70–99)
Glucose-Capillary: 178 mg/dL — ABNORMAL HIGH (ref 70–99)
Glucose-Capillary: 156 mg/dL — ABNORMAL HIGH (ref 70–99)
Glucose-Capillary: 134 mg/dL — ABNORMAL HIGH (ref 70–99)

## 2024-09-03 LAB — CBC
HCT: 33 % — ABNORMAL LOW (ref 39.0–52.0)
Hemoglobin: 11.1 g/dL — ABNORMAL LOW (ref 13.0–17.0)
MCH: 29.4 pg (ref 26.0–34.0)
MCHC: 33.6 g/dL (ref 30.0–36.0)
MCV: 87.3 fL (ref 80.0–100.0)
Platelets: 175 K/uL (ref 150–400)
RBC: 3.78 MIL/uL — ABNORMAL LOW (ref 4.22–5.81)
RDW: 13.3 % (ref 11.5–15.5)
WBC: 12.9 K/uL — ABNORMAL HIGH (ref 4.0–10.5)
nRBC: 0 % (ref 0.0–0.2)

## 2024-09-03 LAB — BASIC METABOLIC PANEL WITH GFR
Anion gap: 8 (ref 5–15)
BUN: 19 mg/dL (ref 8–23)
CO2: 26 mmol/L (ref 22–32)
Calcium: 7.5 mg/dL — ABNORMAL LOW (ref 8.9–10.3)
Chloride: 101 mmol/L (ref 98–111)
Creatinine, Ser: 1.32 mg/dL — ABNORMAL HIGH (ref 0.61–1.24)
GFR, Estimated: 59 mL/min — ABNORMAL LOW (ref >60)
Glucose, Bld: 120 mg/dL — ABNORMAL HIGH (ref 70–99)
Potassium: 3.9 mmol/L (ref 3.5–5.1)
Sodium: 135 mmol/L (ref 135–145)

## 2024-09-03 MED ORDER — INSULIN ASPART 100 UNIT/ML IJ SOLN
0.0000 [IU] | Freq: Three times a day (TID) | INTRAMUSCULAR | Status: DC
  Administered 2024-09-03: 4 [IU] via SUBCUTANEOUS
  Administered 2024-09-03 (×2): 2 [IU] via SUBCUTANEOUS
  Administered 2024-09-04: 8 [IU] via SUBCUTANEOUS
  Administered 2024-09-04 (×2): 2 [IU] via SUBCUTANEOUS
  Filled 2024-09-03: qty 4
  Filled 2024-09-03: qty 8
  Filled 2024-09-03 (×4): qty 2

## 2024-09-03 MED ORDER — SODIUM CHLORIDE 0.9 % IV SOLN: 250.0000 mL | INTRAVENOUS | Status: AC | PRN

## 2024-09-03 MED ORDER — LIDOCAINE 5 % EX PTCH
1.0000 | MEDICATED_PATCH | CUTANEOUS | Status: DC
  Administered 2024-09-03 – 2024-09-04 (×2): 1 via TRANSDERMAL
  Filled 2024-09-03 (×3): qty 1

## 2024-09-03 MED ORDER — ~~LOC~~ CARDIAC SURGERY, PATIENT & FAMILY EDUCATION: Freq: Once | Status: AC

## 2024-09-03 MED ORDER — SODIUM CHLORIDE 0.9% FLUSH
3.0000 mL | Freq: Two times a day (BID) | INTRAVENOUS | Status: DC
  Administered 2024-09-03 – 2024-09-05 (×4): 3 mL via INTRAVENOUS

## 2024-09-03 MED ORDER — METHOCARBAMOL 500 MG PO TABS
500.0000 mg | ORAL_TABLET | Freq: Three times a day (TID) | ORAL | Status: DC
  Administered 2024-09-03 – 2024-09-05 (×7): 500 mg via ORAL
  Filled 2024-09-03 (×7): qty 1

## 2024-09-03 MED ORDER — HEPARIN SODIUM (PORCINE) 5000 UNIT/ML IJ SOLN
5000.0000 [IU] | Freq: Three times a day (TID) | INTRAMUSCULAR | Status: DC
  Administered 2024-09-03 – 2024-09-05 (×6): 5000 [IU] via SUBCUTANEOUS
  Filled 2024-09-03 (×6): qty 1

## 2024-09-03 MED ORDER — FUROSEMIDE 10 MG/ML IJ SOLN
40.0000 mg | Freq: Once | INTRAMUSCULAR | Status: AC
  Administered 2024-09-03: 40 mg via INTRAVENOUS
  Filled 2024-09-03: qty 4

## 2024-09-03 MED ORDER — SODIUM CHLORIDE 0.9% FLUSH: 3.0000 mL | INTRAVENOUS | Status: DC | PRN

## 2024-09-03 NOTE — Progress Notes (Signed)
 2 Days Post-Op Procedure(s) (LRB): CORONARY ARTERY BYPASS GRAFTING X 4, USING LEFT INTERAL MAMMARY ARTERY AND ENDOSCOPIC HARVESTED LEFT GREATER SAPHENOUS VEIN (N/A) ECHOCARDIOGRAM, TRANSESOPHAGEAL, INTRAOPERATIVE (N/A) Subjective: Doing well this morning, still mostly complaining of low back pain Hemodynamics stable Walking limited by back pain; flexeril  helping a little bit  Objective: Vital signs in last 24 hours: BP 116/82   Pulse 94   Temp 97.9 F (36.6 C) (Oral)   Resp 17   Ht 5' 7 (1.702 m)   Wt 90.8 kg   SpO2 92%   BMI 31.34 kg/m  Filed Weights   09/01/24 0649 09/02/24 0500 09/03/24 0500  Weight: 90.4 kg 95.2 kg 90.8 kg    Hemodynamic parameters for last 24 hours: CVP:  [8 mmHg] 8 mmHg CO:  [6.9 L/min] 6.9 L/min CI:  [3.4 L/min/m2] 3.4 L/min/m2  Intake/Output from previous day: 11/27 0701 - 11/28 0700 In: 56.4 [I.V.:56.4] Out: 1795 [Urine:1365; Chest Tube:430] Intake/Output this shift: No intake/output data recorded.  Physical Exam: General - Resting comfortably in chair CV - RRR Resp - Unlabored on RA Abd - Soft, ND/NT Ext - Mild edema  Lab Results:    Latest Ref Rng & Units 09/03/2024    3:23 AM 09/02/2024    4:03 PM 09/02/2024    4:43 AM  CBC  WBC 4.0 - 10.5 K/uL 12.9  18.3  17.1   Hemoglobin 13.0 - 17.0 g/dL 88.8  87.8  88.4   Hematocrit 39.0 - 52.0 % 33.0  36.2  34.0   Platelets 150 - 400 K/uL 175  204  181       Latest Ref Rng & Units 09/03/2024    3:23 AM 09/02/2024    4:03 PM 09/02/2024    4:43 AM  CMP  Glucose 70 - 99 mg/dL 879  797  865   BUN 8 - 23 mg/dL 19  19  16    Creatinine 0.61 - 1.24 mg/dL 8.67  8.57  8.68   Sodium 135 - 145 mmol/L 135  132  135   Potassium 3.5 - 5.1 mmol/L 3.9  4.5  4.2   Chloride 98 - 111 mmol/L 101  99  104   CO2 22 - 32 mmol/L 26  24  21    Calcium  8.9 - 10.3 mg/dL 7.5  7.5  7.1     CXR: Stable  Assessment/Plan: S/P Procedure(s) (LRB): CORONARY ARTERY BYPASS GRAFTING X 4, USING LEFT INTERAL  MAMMARY ARTERY AND ENDOSCOPIC HARVESTED LEFT GREATER SAPHENOUS VEIN (N/A) ECHOCARDIOGRAM, TRANSESOPHAGEAL, INTRAOPERATIVE (N/A) POD2 s/p CABG, progressing well NEURO- intact  Pain control PRN Will add robaxin for back pain CV- in SR around 90s bpm             Remove pacing wires RESP- Continued improved lung aeration             Continue IS, pulm hygiene, ambulation  Remove mediastinal tubes, keep left pleural Chest tube RENAL- creatinine stableand lytes Ok  Weight slightly up             Repeat IV 40 x 1 Lasix   Foley will come out today GI- tolerating diet  BM: None since surgery Endo- BG well controlled on semglee  +ISS Transition to qACHS ISS + semglee  ID- No active issues DVT ppx - SCD + HSQ 5000 TID starting today  Dispo: Transfer to floor   LOS: 5 days    Con RAMAN Asal Teas 09/03/2024

## 2024-09-03 NOTE — Plan of Care (Signed)
  Problem: Coping: Goal: Ability to adjust to condition or change in health will improve Outcome: Progressing   Problem: Fluid Volume: Goal: Ability to maintain a balanced intake and output will improve Outcome: Progressing   Problem: Health Behavior/Discharge Planning: Goal: Ability to identify and utilize available resources and services will improve Outcome: Progressing   Problem: Skin Integrity: Goal: Risk for impaired skin integrity will decrease Outcome: Progressing   Problem: Tissue Perfusion: Goal: Adequacy of tissue perfusion will improve Outcome: Progressing   Problem: Education: Goal: Knowledge of General Education information will improve Description: Including pain rating scale, medication(s)/side effects and non-pharmacologic comfort measures Outcome: Progressing

## 2024-09-03 NOTE — Progress Notes (Signed)
 NAME:  Bryan Vora., MRN:  [id number], DOB:  11/22/1955, LOS: 5 ADMISSION DATE:  08/29/2024, CONSULTATION DATE:  11/26 REFERRING MD:  SU, CHIEF COMPLAINT:  post-CABG   History of Present Illness:   68 year old male w/Hx as outlined below. Presented to ER 11/23 w/cc: chest pain X1-2d w/ associated SOB. Initial ECG NSR w/ PACs but no ischemia,+ rising trops w/on-going pain concerning for NSTEMI. Went to cardiac cath 11/24 which showed severe 3 CAD (90 and 80% prox/mid LAD, 90-95% bifurcation of Ramous intermedius, 95% OM1 and total occlusion of distal RCA. LVEDP noted at . Placed on ASA, NTG paste and isosorbide  started. ECHO EF60-65%mild LVH, grade I diastolic dysfxn, nml RV fxn, TAPSE 23mm Cardiothoracic surgery was consulted for CABG and pt was brought to OR on 11/26 with Dr Daniel. Critical care consulted to assist w/post-operative critical care management   OR report Pump time: 2h Clamp time: 1h 50 min  EBL: 812 Cell saver 460 Products: 300 ml PLTs ~ 2 liters crystalloid  Post ECHO EF still nml, mild MR Pertinent  Medical History  OSA, dyslipidemia, CKD stage III, type II DM, CVA x 2   Significant Hospital Events: Including procedures, antibiotic start and stop dates in addition to other pertinent events   11/23 admitted w/CP rising trops c/w NSTEMI 11/24 left heart cath svr 3V disease. Mildly elevated LVEDP . ECHO nml EF, gd 1 diastolic dysfxn. LVH 11/26 CABG 3V (su) 11/27 diurese 11/27 mediastinal tubes out wires out txf to floor   Interim History / Subjective:  Back pain overnight Labs look great this morning -- cr down to 1.32   Diuresed yesterday    Objective    Blood pressure 123/66, pulse 92, temperature 97.9 F (36.6 C), temperature source Oral, resp. rate 12, height 5' 7 (1.702 m), weight 90.8 kg, SpO2 92%.        Intake/Output Summary (Last 24 hours) at 09/03/2024 1002 Last data filed at 09/03/2024 0800 Gross per 24 hour  Intake 316.62 ml   Output 1620 ml  Net -1303.38 ml   Filed Weights   09/01/24 0649 09/02/24 0500 09/03/24 0500  Weight: 90.4 kg 95.2 kg 90.8 kg    Examination: General:  wdwn adult M NAD  HENT: NCAT pink mm  Lungs: even unlabored  Cardiovascular:  2x mediastinal 1x L pleural chest tube. rr Abdomen: round soft  Extremities: no acute joint deformity  Neuro:  AAOx4  GU: foley   Resolved problem list   Thrombocytopenia  Assessment and Plan   NSTEMI mvCAD s/p CABG x3  Hx HTN  Hx HLD  Mild ascending aorta dilatation  AKI AoC pain (back pain)  DM2  Hx prior CVA Hx OSA  P -post op, L/T/D/W per CVTS -- meds out keep L pleural, wires out, foley out   -to start chem vte ppx  -multimodal analgesia, adding robaxin and lido patch   -asa statin beta blocker  -mobility, pulm hygiene  -SSI + glargine 14 u/d  -repeat diuresis 11/28  -annual CTA  -to transfer out of ICU 11/28    Labs   CBC: Recent Labs  Lab 09/01/24 1554 09/01/24 1558 09/01/24 2033 09/01/24 2119 09/02/24 0443 09/02/24 1603 09/03/24 0323  WBC 10.6*  --   --  12.4* 17.1* 18.3* 12.9*  HGB 10.7*   < > 9.2* 11.3* 11.5* 12.1* 11.1*  HCT 31.0*   < > 27.0* 33.1* 34.0* 36.2* 33.0*  MCV 84.7  --   --  84.9 85.9 86.2 87.3  PLT 128*  --   --  162 181 204 175   < > = values in this interval not displayed.    Basic Metabolic Panel: Recent Labs  Lab 08/30/24 0535 08/31/24 0342 09/01/24 0315 09/01/24 0810 09/01/24 1404 09/01/24 1409 09/01/24 2033 09/01/24 2119 09/02/24 0443 09/02/24 1603 09/03/24 0323  NA 138 137 134*   < > 138   < > 140 138 135 132* 135  K 3.9 3.8 3.8   < > 4.5   < > 3.9 4.2 4.2 4.5 3.9  CL 105 105 104   < > 104  --   --  106 104 99 101  CO2 23 22 20*  --   --   --   --  22 21* 24 26  GLUCOSE 188* 163* 153*   < > 160*  --   --  121* 134* 202* 120*  BUN 14 16 17    < > 15  --   --  15 16 19 19   CREATININE 1.05 1.20 1.21   < > 1.00  --   --  1.18 1.31* 1.42* 1.32*  CALCIUM  8.5* 8.6* 8.5*  --   --   --    --  6.8* 7.1* 7.5* 7.5*  MG 2.1 1.6* 2.2  --   --   --   --  3.5* 2.8* 2.9*  --   PHOS 2.6  --   --   --   --   --   --   --   --   --   --    < > = values in this interval not displayed.   GFR: Estimated Creatinine Clearance: 58.4 mL/min (A) (by C-G formula based on SCr of 1.32 mg/dL (H)). Recent Labs  Lab 09/01/24 1644 09/01/24 2119 09/02/24 0443 09/02/24 1603 09/03/24 0323  WBC  --  12.4* 17.1* 18.3* 12.9*  LATICACIDVEN 1.3  --   --   --   --     Liver Function Tests: Recent Labs  Lab 09/01/24 0315  AST 23  ALT 26  ALKPHOS 55  BILITOT 1.2  PROT 6.1*  ALBUMIN  3.2*   No results for input(s): LIPASE, AMYLASE in the last 168 hours. No results for input(s): AMMONIA in the last 168 hours.  ABG    Component Value Date/Time   PHART 7.406 09/01/2024 2033   PCO2ART 32.3 09/01/2024 2033   PO2ART 87 09/01/2024 2033   HCO3 20.3 09/01/2024 2033   TCO2 21 (L) 09/01/2024 2033   ACIDBASEDEF 4.0 (H) 09/01/2024 2033   O2SAT 97 09/01/2024 2033     Coagulation Profile: Recent Labs  Lab 08/29/24 1813 09/01/24 1554  INR 1.0 1.4*    Cardiac Enzymes: No results for input(s): CKTOTAL, CKMB, CKMBINDEX, TROPONINI in the last 168 hours.  HbA1C: HB A1C (BAYER DCA - WAIVED)  Date/Time Value Ref Range Status  04/06/2024 11:16 AM 8.8 (H) 4.8 - 5.6 % Final    Comment:             Prediabetes: 5.7 - 6.4          Diabetes: >6.4          Glycemic control for adults with diabetes: <7.0   01/01/2024 11:03 AM 6.6 (H) 4.8 - 5.6 % Final    Comment:             Prediabetes: 5.7 - 6.4          Diabetes: >  6.4          Glycemic control for adults with diabetes: <7.0    Hgb A1c MFr Bld  Date/Time Value Ref Range Status  08/30/2024 05:35 AM 8.4 (H) 4.8 - 5.6 % Final    Comment:    (NOTE)         Prediabetes: 5.7 - 6.4         Diabetes: >6.4         Glycemic control for adults with diabetes: <7.0     CBG: Recent Labs  Lab 09/02/24 1541 09/02/24 1921  09/02/24 2320 09/03/24 0306 09/03/24 0646  GLUCAP 189* 230* 145* 97 123*    Mod MDM    Ronnald Gave MSN, AGACNP-BC [facility] Amion for pager  09/03/2024, 10:02 AM

## 2024-09-03 NOTE — Progress Notes (Signed)
 CARDIAC REHAB PHASE I   Post OHS education including site care, restrictions, heart healthy diabetic diet, sternal precautions, IS use at home, home needs at discharge, exercise guidelines, and CRP2 reviewed. All questions and concerns addressed. Will refer to [facility] for CRP2.   Fairy JONETTA Music, RN BSN 09/03/2024 4:10 PM  [phone]

## 2024-09-04 ENCOUNTER — Inpatient Hospital Stay (HOSPITAL_COMMUNITY)

## 2024-09-04 LAB — TYPE AND SCREEN
ABO/RH(D): O NEG
Antibody Screen: NEGATIVE
Unit division: 0
Unit division: 0

## 2024-09-04 LAB — CBC
HCT: 32.6 % — ABNORMAL LOW (ref 39.0–52.0)
Hemoglobin: 11.2 g/dL — ABNORMAL LOW (ref 13.0–17.0)
MCH: 29.1 pg (ref 26.0–34.0)
MCHC: 34.4 g/dL (ref 30.0–36.0)
MCV: 84.7 fL (ref 80.0–100.0)
Platelets: 213 K/uL (ref 150–400)
RBC: 3.85 MIL/uL — ABNORMAL LOW (ref 4.22–5.81)
RDW: 13.3 % (ref 11.5–15.5)
WBC: 10.6 K/uL — ABNORMAL HIGH (ref 4.0–10.5)
nRBC: 0 % (ref 0.0–0.2)

## 2024-09-04 LAB — BPAM RBC
Blood Product Expiration Date: 202512062359
Blood Product Expiration Date: 202512102359
ISSUE DATE / TIME: 202511181617
Unit Type and Rh: 9500
Unit Type and Rh: 9500

## 2024-09-04 LAB — BASIC METABOLIC PANEL WITH GFR
Anion gap: 11 (ref 5–15)
BUN: 21 mg/dL (ref 8–23)
CO2: 25 mmol/L (ref 22–32)
Calcium: 7.7 mg/dL — ABNORMAL LOW (ref 8.9–10.3)
Chloride: 100 mmol/L (ref 98–111)
Creatinine, Ser: 1.42 mg/dL — ABNORMAL HIGH (ref 0.61–1.24)
GFR, Estimated: 54 mL/min — ABNORMAL LOW (ref >60)
Glucose, Bld: 146 mg/dL — ABNORMAL HIGH (ref 70–99)
Potassium: 3.6 mmol/L (ref 3.5–5.1)
Sodium: 136 mmol/L (ref 135–145)

## 2024-09-04 LAB — GLUCOSE, CAPILLARY
Glucose-Capillary: 143 mg/dL — ABNORMAL HIGH (ref 70–99)
Glucose-Capillary: 219 mg/dL — ABNORMAL HIGH (ref 70–99)
Glucose-Capillary: 143 mg/dL — ABNORMAL HIGH (ref 70–99)
Glucose-Capillary: 97 mg/dL (ref 70–99)

## 2024-09-04 MED ORDER — FUROSEMIDE 10 MG/ML IJ SOLN: 40.0000 mg | Freq: Once | INTRAMUSCULAR | Status: DC

## 2024-09-04 MED ORDER — METOPROLOL TARTRATE 25 MG/10 ML ORAL SUSPENSION
25.0000 mg | Freq: Two times a day (BID) | ORAL | Status: DC
  Filled 2024-09-04: qty 10

## 2024-09-04 MED ORDER — FUROSEMIDE 40 MG PO TABS
40.0000 mg | ORAL_TABLET | Freq: Every day | ORAL | Status: DC
  Administered 2024-09-04 – 2024-09-05 (×2): 40 mg via ORAL
  Filled 2024-09-04 (×2): qty 1

## 2024-09-04 MED ORDER — TORSEMIDE 20 MG PO TABS
20.0000 mg | ORAL_TABLET | Freq: Every day | ORAL | Status: DC
Start: 2024-09-04 — End: 2024-09-04

## 2024-09-04 MED ORDER — POTASSIUM CHLORIDE CRYS ER 20 MEQ PO TBCR
40.0000 meq | EXTENDED_RELEASE_TABLET | Freq: Every day | ORAL | Status: AC
  Administered 2024-09-04: 40 meq via ORAL
  Filled 2024-09-04: qty 2

## 2024-09-04 MED ORDER — METOPROLOL TARTRATE 25 MG PO TABS
25.0000 mg | ORAL_TABLET | Freq: Two times a day (BID) | ORAL | Status: DC
  Administered 2024-09-04 – 2024-09-05 (×3): 25 mg via ORAL
  Filled 2024-09-04 (×3): qty 1

## 2024-09-04 NOTE — Progress Notes (Signed)
 CARDIAC REHAB PHASE I   PRE:  Rate/Rhythm: NSR  BP:  Sitting: 119/69      SaO2: 94%  MODE:  Ambulation: 300 ft   POST:  Rate/Rhythm: NSR  BP:  Sitting: 134/82      SaO2: 97%  Pt received in bed with chest tube to gravity. Patient agrees to ambulate. Assisted patient to sit at bedside. Pt felt lightheaded for a few minutes then felt better. Provided water  and encouraged hydration. Pt pulled 500 mL on I/S, encouraged 10 x/hr. Pt ambulated with rolling walker, steady gait. Pt felt some SOB with good SaO2 @ 94%. Pt back to bed with assistance. CT in place, to gravity. Pt has tray table and call bell in reach. Pt is awaiting removal of CT. Pt was encouraged, after required bedrest, to be OOB to chair. Pt had no questions regarding education he received yesterday.   Bryan Parkins, MS, ACSM CEP, CCRP 09/04/2024 10:15-10:45

## 2024-09-04 NOTE — Plan of Care (Signed)

## 2024-09-04 NOTE — Progress Notes (Addendum)
 [address] [place] [employer] [address]             [phone]         3 Days Post-Op Procedure(s) (LRB): CORONARY ARTERY BYPASS GRAFTING X 4, USING LEFT INTERAL MAMMARY ARTERY AND ENDOSCOPIC HARVESTED LEFT GREATER SAPHENOUS VEIN (N/A) ECHOCARDIOGRAM, TRANSESOPHAGEAL, INTRAOPERATIVE (N/A)  Subjective:  Patient doing okay.  States its hard to navigate doing things yourself with obstacles in the way.  He has not ambulated since moving to this unit.  He also has not moved his bowels, but is passing gas.  He states he prefers to wait until he gets home to move his bowels.  Objective: Vital signs in last 24 hours: Temp:  [97.6 F (36.4 C)-99 F (37.2 C)] 97.7 F (36.5 C) (11/29 0749) Pulse Rate:  [82-95] 94 (11/29 0749) Cardiac Rhythm: Normal sinus rhythm (11/28 2046) Resp:  [12-24] 15 (11/29 0749) BP: (104-149)/(60-84) 127/84 (11/29 0749) SpO2:  [91 %-99 %] 97 % (11/29 0749) Weight:  [91.9 kg] 91.9 kg (11/29 0500)  Intake/Output from previous day: 11/28 0701 - 11/29 0700 In: 329.6 [I.V.:29.6; IV Piggyback:300] Out: 1530 [Urine:1350; Chest Tube:180]  General appearance: alert, cooperative, and no distress Heart: regular rate and rhythm Lungs: diminished breath sounds bilaterally Abdomen: soft, non-tender; bowel sounds normal; no masses,  no organomegaly Extremities: edema trace Wound: clean and dry  Lab Results: Recent Labs    09/03/24 0323 09/04/24 0311  WBC 12.9* 10.6*  HGB 11.1* 11.2*  HCT 33.0* 32.6*  PLT 175 213   BMET:  Recent Labs    09/03/24 0323 09/04/24 0311  NA 135 136  K 3.9 3.6  CL 101 100  CO2 26 25  GLUCOSE 120* 146*  BUN 19 21  CREATININE 1.32* 1.42*  CALCIUM  7.5* 7.7*    PT/INR:  Recent Labs    09/01/24 1554  LABPROT 18.0*  INR 1.4*   ABG    Component Value Date/Time   PHART 7.406 09/01/2024 2033   HCO3 20.3 09/01/2024 2033   TCO2 21 (L) 09/01/2024 2033   ACIDBASEDEF 4.0 (H) 09/01/2024 2033   O2SAT 97  09/01/2024 2033   CBG (last 3)  Recent Labs    09/03/24 1609 09/03/24 2006 09/04/24 0611  GLUCAP 156* 134* 143*    Assessment/Plan: S/P Procedure(s) (LRB): CORONARY ARTERY BYPASS GRAFTING X 4, USING LEFT INTERAL MAMMARY ARTERY AND ENDOSCOPIC HARVESTED LEFT GREATER SAPHENOUS VEIN (N/A) ECHOCARDIOGRAM, TRANSESOPHAGEAL, INTRAOPERATIVE (N/A)  CV- NSR, BP is mostly controlled.SABRA occasional isolated elevation- will increase Lopressor  to 25 mg BID Pulm- CT with 180 cc serous output.. no air leak.. however of note coughing/deep respiratory effort is weak.. CXR shows atelectasis.SABRA trace left effusion.. patient instructed on importance of coughing and deep breathing IS use Renal- creatinine is stable at 1.42, weight remains elevated, but is trending down.. transition to PO lasix  GI- no BM, patient doesn't wish to have laxative, passing gas.. continue stool softners DM- sugars controlled, patient with preoperative A1c of 8.4.SABRA poorly controlled, per outpatient documentation patient is non-compliant with management.. he will need to follow up with PCP for likely initiation of medication, as dietary changes are not achieving control   LOS: 6 days    Rocky Shad, PA-C 09/04/2024 8:33 AM  Doing well this morning.  Chest tube with 180 ml serous output.  Had a BM this morning that was small, lots of gas.  Creatinine essentially stable, weight is still slightly up so  will transition to PO regimen.  Will remove left pleural tube today, repeat lasix  x 1.  Encouraged patient to take his statin to which he is amenable.    Con Clunes, MD Cardiothoracic Surgery Pager: [phone]

## 2024-09-04 NOTE — Progress Notes (Signed)
   09/04/24 2237  BiPAP/CPAP/SIPAP  BiPAP/CPAP/SIPAP Pt Type Adult  Reason BIPAP/CPAP not in use Other(comment) (declined cpap for night)

## 2024-09-04 NOTE — Progress Notes (Signed)
 Chest tube removed per order, vitals taken.

## 2024-09-05 ENCOUNTER — Inpatient Hospital Stay (HOSPITAL_COMMUNITY)

## 2024-09-05 LAB — GLUCOSE, CAPILLARY: Glucose-Capillary: 108 mg/dL — ABNORMAL HIGH (ref 70–99)

## 2024-09-05 LAB — CBC
HCT: 38.3 % — ABNORMAL LOW (ref 39.0–52.0)
Hemoglobin: 12.6 g/dL — ABNORMAL LOW (ref 13.0–17.0)
MCH: 28.4 pg (ref 26.0–34.0)
MCHC: 32.9 g/dL (ref 30.0–36.0)
MCV: 86.3 fL (ref 80.0–100.0)
Platelets: 288 K/uL (ref 150–400)
RBC: 4.44 MIL/uL (ref 4.22–5.81)
RDW: 13.5 % (ref 11.5–15.5)
WBC: 12.1 K/uL — ABNORMAL HIGH (ref 4.0–10.5)
nRBC: 0 % (ref 0.0–0.2)

## 2024-09-05 LAB — BASIC METABOLIC PANEL WITH GFR
Anion gap: 14 (ref 5–15)
BUN: 19 mg/dL (ref 8–23)
CO2: 25 mmol/L (ref 22–32)
Calcium: 8.4 mg/dL — ABNORMAL LOW (ref 8.9–10.3)
Chloride: 101 mmol/L (ref 98–111)
Creatinine, Ser: 1.34 mg/dL — ABNORMAL HIGH (ref 0.61–1.24)
GFR, Estimated: 58 mL/min — ABNORMAL LOW (ref >60)
Glucose, Bld: 119 mg/dL — ABNORMAL HIGH (ref 70–99)
Potassium: 3.8 mmol/L (ref 3.5–5.1)
Sodium: 140 mmol/L (ref 135–145)

## 2024-09-05 MED ORDER — METOPROLOL TARTRATE 25 MG PO TABS: 25.0000 mg | ORAL_TABLET | Freq: Two times a day (BID) | ORAL | 3 refills | Status: AC

## 2024-09-05 MED ORDER — METHOCARBAMOL 500 MG PO TABS: 500.0000 mg | ORAL_TABLET | Freq: Three times a day (TID) | ORAL | 0 refills | Status: DC | PRN

## 2024-09-05 MED ORDER — ACETAMINOPHEN 500 MG PO TABS: 500.0000 mg | ORAL_TABLET | Freq: Four times a day (QID) | ORAL | Status: AC | PRN

## 2024-09-05 MED ORDER — TRAMADOL HCL 50 MG PO TABS: 50.0000 mg | ORAL_TABLET | ORAL | 0 refills | Status: DC | PRN

## 2024-09-05 MED ORDER — ROSUVASTATIN CALCIUM 20 MG PO TABS: 20.0000 mg | ORAL_TABLET | Freq: Every day | ORAL | 3 refills | Status: AC

## 2024-09-05 MED ORDER — METFORMIN HCL 500 MG PO TABS: 500.0000 mg | ORAL_TABLET | Freq: Two times a day (BID) | ORAL | 1 refills | Status: AC

## 2024-09-05 MED ORDER — CLOPIDOGREL BISULFATE 75 MG PO TABS: 75.0000 mg | ORAL_TABLET | Freq: Every day | ORAL | 3 refills | Status: DC

## 2024-09-05 MED ORDER — FUROSEMIDE 40 MG PO TABS: 40.0000 mg | ORAL_TABLET | Freq: Every day | ORAL | 0 refills | Status: DC

## 2024-09-05 MED ORDER — POTASSIUM CHLORIDE CRYS ER 20 MEQ PO TBCR: 20.0000 meq | EXTENDED_RELEASE_TABLET | Freq: Every day | ORAL | 0 refills | Status: AC

## 2024-09-05 NOTE — Progress Notes (Signed)
 DISCHARGE NOTE HOME Bryan Shepard. to be discharged Home per MD order. Discussed prescriptions and follow up appointments with the patient. Prescriptions given to patient; medication list explained in detail. Patient verbalized understanding.  Skin clean, dry and intact without evidence of skin break down, no evidence of skin tears noted. IV catheter discontinued intact. Site without signs and symptoms of complications. Dressing and pressure applied. Pt denies pain at the site currently. No complaints noted.  See LDA for surgical incisions at discharge chest and left leg Patient free of lines, drains, and wounds.   An After Visit Summary (AVS) was printed and given to the patient. Patient escorted via wheelchair, and discharged home via private auto.  Peyton SHAUNNA Pepper, RN

## 2024-09-05 NOTE — Evaluation (Addendum)
 Physical Therapy Evaluation Patient Details Name: Bryan Shepard. MRN: [id number] DOB: 20-Jun-1956 Today's Date: 09/05/2024  History of Present Illness  68 y.o. male presents to [facility] 08/29/24 with NSTEMI w/ severe 4V CAD. 11/24 L heart cath. 11/26 CABGx4. ICU 11/26-11/28. PMHx: hypertension, type 2 diabetes, GERD, prior CVA x 2 (with no motor or sensory deficits), TIA, OSA  Clinical Impression  PTA pt was independent for mobility with no AD and working as a engineer, water. Pt's main limitations include decreased activity tolerance and fatigue. Pt was able to recall 2/3 sternal precautions with education provided and handout given. Pt was able to ambulate 146ft with supervision and no AD with distance limited by fatigue. Also able to negotiate 5 steps with portable step in the room. Light support from sink counter with instructions provided to not push to adhere to precautions. Discussed transition to home with wife able to provide steadying assist via [facility] for the entry steps. Pt will have 24/7 assist available upon d/c home. Recommending cardiac rehab with acute PT to follow.     97-98% SpO2 on RA with activity HR 81 BPM      If plan is discharge home, recommend the following: A little help with walking and/or transfers;Assist for transportation;Help with stairs or ramp for entrance   Can travel by private vehicle    Yes    Equipment Recommendations None recommended by PT     Functional Status Assessment Patient has had a recent decline in their functional status and demonstrates the ability to make significant improvements in function in a reasonable and predictable amount of time.     Precautions / Restrictions Precautions Precautions: Sternal;Fall Precaution Booklet Issued: Yes (comment) Recall of Precautions/Restrictions: Impaired Precaution/Restrictions Comments: Able to recall 2/3 precautions Restrictions Weight Bearing Restrictions Per Provider Order: No Other  Position/Activity Restrictions: sternal precautions      Mobility  Bed Mobility Overal bed mobility: Needs Assistance Bed Mobility: Rolling, Sidelying to Sit, Sit to Sidelying Rolling: Modified independent (Device/Increase time) Sidelying to sit: Modified independent (Device/Increase time)    Sit to sidelying: Modified independent (Device/Increase time) General bed mobility comments: cueing for log roll technique with good adherance to precautions    Transfers Overall transfer level: Needs assistance Equipment used: None Transfers: Sit to/from Stand Sit to Stand: Modified independent (Device/Increase time)    General transfer comment: able to stand w/o UE support    Ambulation/Gait Ambulation/Gait assistance: Supervision Gait Distance (Feet): 180 Feet Assistive device: None Gait Pattern/deviations: Step-through pattern, Decreased stride length Gait velocity: decr     General Gait Details: steady gait with no overt LOB, distance limited by fatigue  Stairs Stairs: Yes Stairs assistance: Supervision Stair Management: One rail Left, Step to pattern, Forwards, Backwards Number of Stairs: 5 General stair comments: used portable step in room with L hand resting on L countertop. Instructed to not push through hand with good adherance. Able to step forwards/backwards with supervision     Balance Overall balance assessment: Needs assistance, Mild deficits observed, not formally tested Sitting-balance support: No upper extremity supported, Feet supported Sitting balance-Leahy Scale: Good     Standing balance support: No upper extremity supported Standing balance-Leahy Scale: Good       Pertinent Vitals/Pain Pain Assessment Pain Assessment: No/denies pain    Home Living Family/patient expects to be discharged to:: Private residence Living Arrangements: Spouse/significant other Available Help at Discharge: Family;Available 24 hours/day Type of Home: House Home Access:  Stairs to enter Entrance Stairs-Rails: None Entrance [employer]  of Steps: 2 Alternate Level Stairs-Number of Steps: 11 (5+6) Home Layout: Two level;Able to live on main level with bedroom/bathroom Home Equipment: Shower seat      Prior Function Prior Level of Function : Independent/Modified Independent;Driving    Mobility Comments: Ind with no AD ADLs Comments: Works as Engineer, Water Upper Extremity Assessment: Overall WFL for tasks assessed (within sternal precautions)    Lower Extremity Assessment Lower Extremity Assessment: Overall WFL for tasks assessed    Cervical / Trunk Assessment Cervical / Trunk Assessment: Normal  Communication   Communication Communication: No apparent difficulties    Cognition Arousal: Alert Behavior During Therapy: WFL for tasks assessed/performed   PT - Cognitive impairments: No apparent impairments    Following commands: Intact       Cueing Cueing Techniques: Verbal cues, Visual cues      PT Assessment Patient needs continued PT services  PT Problem List Decreased activity tolerance;Decreased balance;Decreased mobility;Cardiopulmonary status limiting activity       PT Treatment Interventions DME instruction;Gait training;Stair training;Therapeutic activities;Functional mobility training;Therapeutic exercise;Balance training;Neuromuscular re-education;Patient/family education    PT Goals (Current goals can be found in the Care Plan section)  Acute Rehab PT Goals Patient Stated Goal: to go home and get stronger PT Goal Formulation: With patient Time For Goal Achievement: 09/19/24 Potential to Achieve Goals: Good    Frequency Min 2X/week        AM-PAC PT 6 Clicks Mobility  Outcome Measure Help needed turning from your back to your side while in a flat bed without using bedrails?: None Help needed moving from lying on your back to sitting on the side  of a flat bed without using bedrails?: None Help needed moving to and from a bed to a chair (including a wheelchair)?: A Little Help needed standing up from a chair using your arms (e.g., wheelchair or bedside chair)?: None Help needed to walk in hospital room?: A Little Help needed climbing 3-5 steps with a railing? : A Little 6 Click Score: 21    End of Session   Activity Tolerance: Patient tolerated treatment well Patient left: in bed;with call bell/phone within reach Nurse Communication: Mobility status PT Visit Diagnosis: Other abnormalities of gait and mobility (R26.89)    Time: 8870-8854 PT Time Calculation (min) (ACUTE ONLY): 16 min   Charges:   PT Evaluation $PT Eval Low Complexity: 1 Low   PT General Charges $$ ACUTE PT VISIT: 1 Visit        Kate ORN, PT, DPT Secure Chat Preferred  Rehab Office [phone]   Kate BRAVO Wendolyn 09/05/2024, 11:56 AM

## 2024-09-05 NOTE — Progress Notes (Addendum)
      [address] [employer] [address]             [phone]         4 Days Post-Op Procedure(s) (LRB): CORONARY ARTERY BYPASS GRAFTING X 4, USING LEFT INTERAL MAMMARY ARTERY AND ENDOSCOPIC HARVESTED LEFT GREATER SAPHENOUS VEIN (N/A) ECHOCARDIOGRAM, TRANSESOPHAGEAL, INTRAOPERATIVE (N/A)  Subjective:  Patient doing well.  He has no new complaints, but really wants to go home today.  He has moved his bowels.  + ambulation  Objective: Vital signs in last 24 hours: Temp:  [97.7 F (36.5 C)-99.2 F (37.3 C)] 98.1 F (36.7 C) (11/30 0740) Pulse Rate:  [83-93] 89 (11/30 0740) Cardiac Rhythm: Normal sinus rhythm (11/30 0805) Resp:  [16-22] 16 (11/30 0740) BP: (117-151)/(64-96) 151/85 (11/30 0740) SpO2:  [95 %-100 %] 97 % (11/30 0740) Weight:  [89.5 kg] 89.5 kg (11/30 0310)  Intake/Output from previous day: 11/29 0701 - 11/30 0700 In: 3 [I.V.:3] Out: -   General appearance: alert, cooperative, and no distress Heart: regular rate and rhythm Lungs: diminished breath sounds LLL Abdomen: soft, non-tender; bowel sounds normal; no masses,  no organomegaly Extremities: edema trace Wound: clean and dry  Lab Results: Recent Labs    09/04/24 0311 09/05/24 0227  WBC 10.6* 12.1*  HGB 11.2* 12.6*  HCT 32.6* 38.3*  PLT 213 288   BMET:  Recent Labs    09/04/24 0311 09/05/24 0227  NA 136 140  K 3.6 3.8  CL 100 101  CO2 25 25  GLUCOSE 146* 119*  BUN 21 19  CREATININE 1.42* 1.34*  CALCIUM  7.7* 8.4*    PT/INR: No results for input(s): LABPROT, INR in the last 72 hours. ABG    Component Value Date/Time   PHART 7.406 09/01/2024 2033   HCO3 20.3 09/01/2024 2033   TCO2 21 (L) 09/01/2024 2033   ACIDBASEDEF 4.0 (H) 09/01/2024 2033   O2SAT 97 09/01/2024 2033   CBG (last 3)  Recent Labs    09/04/24 1549 09/04/24 2122 09/05/24 0616  GLUCAP 143* 97 108*    Assessment/Plan: S/P Procedure(s) (LRB): CORONARY ARTERY BYPASS GRAFTING X 4, USING  LEFT INTERAL MAMMARY ARTERY AND ENDOSCOPIC HARVESTED LEFT GREATER SAPHENOUS VEIN (N/A) ECHOCARDIOGRAM, TRANSESOPHAGEAL, INTRAOPERATIVE (N/A)  CV- NSR, BP is stable- continue Lopressor  at 25 mg BID Pulm- off oxygen, CXR w/o pneumothorax post tube removal, trace left pleural effusion, atelectasis improved.. continue IS Renal- creatinine remains stable, today is at 1.3, continue Lasix  x 7 days GI- patient has moved his bowels, stool agents prn DM- uncontrolled preop A1c was 8.4, will plan to start Metformin  at discharge with close follow up with PCP Dispo- patient stable, will discuss possible d/c today with Dr. Daniel   LOS: 7 days    Rocky Shad, PA-C 09/05/2024 9:27 AM  Looks good this morning.  Creatinine is stable, weight coming down, ambulating, had BM. Wants to go home today.  Will go home with 7 days of lasix  and will also give plavix x 1  year given presentation of NSTEMI.  Educated patient on importance of strict glucose control and of taking his statin daily.  Con Daniel, MD Cardiothoracic Surgery Pager: [phone]

## 2024-09-05 NOTE — Progress Notes (Signed)
 Bryan Shepard Bryan Shepard. to be discharged home per MD order. DC RN Allyson discussed with the patient and all questions fully answered.  Skin clean and dry, midsternal incision CDI. IV catheter discontinued intact. Site without signs and symptoms of complications. Dressing and pressure applied.  An After Visit Summary was printed and given to the patient.  Patient escorted via [facility], and discharged to discharge lounge via private auto.  Bryan Shepard  09/05/2024 12:46 PM

## 2024-09-06 ENCOUNTER — Telehealth: Payer: Self-pay | Admitting: *Deleted

## 2024-09-06 MED FILL — Thrombin (Recombinant) For Soln 20000 Unit: CUTANEOUS | Qty: 1 | Status: AC

## 2024-09-06 NOTE — Transitions of Care (Post Inpatient/ED Visit) (Signed)
   09/06/2024  Name: [name] MRN: [id number] DOB: Jul 05, 1956  Today's TOC FU Call Status: Today's TOC FU Call Status:: Unsuccessful Call (1st Attempt) Unsuccessful Call (1st Attempt) Date: 09/06/24  Attempted to reach the patient regarding the most recent Inpatient/ED visit.  Follow Up Plan: Additional outreach attempts will be made to reach the patient to complete the Transitions of Care (Post Inpatient/ED visit) call.   Cathlean Headland BSN RN [facility] Health Care Management Coordinator [email or web address] Direct Dial: [phone]  Fax: [phone] Website: [email or web address]

## 2024-09-07 ENCOUNTER — Telehealth: Payer: Self-pay | Admitting: *Deleted

## 2024-09-07 NOTE — Transitions of Care (Post Inpatient/ED Visit) (Signed)
 09/07/2024  Name: Bryan Shepard Bryan Shepard. MRN: [id number] DOB: 1956-08-19  Today's TOC FU Call Status: Today's TOC FU Call Status:: Successful TOC FU Call Completed TOC FU Call Complete Date: 09/07/24  Patient's Name and Date of Birth confirmed. Name, DOB  Transition Care Management Follow-up Telephone Call Date of Discharge: 09/05/24 Discharge Facility: [facility]) Type of Discharge: Inpatient Admission Primary Inpatient Discharge Diagnosis:: NSTEMI (non-ST elevated myocardial infarction) How have you been since you were released from the hospital?: Better Any questions or concerns?: No  Items Reviewed: Medications obtained,verified, and reconciled?: Yes (Medications Reviewed) Any new allergies since your discharge?: No Dietary orders reviewed?: No Do you have support at home?: Yes People in Home [RPT]: spouse Name of Support/Comfort Primary Source: [place]   Medications Reviewed Today: Medications Reviewed Today     Reviewed by Bryan Shepard Bryan Shepard DEL, RN (Case Manager) on 09/07/24 at 1315  Med List Status: <None>   Medication Order Taking? Sig Documenting Provider Last Dose Status Informant  Accu-Chek FastClix Lancets MISC [id number] Yes CHECK BS DAILY DX E11.9 Severa Rock HERO, FNP  Active Self  acetaminophen  (TYLENOL ) 500 MG tablet [id number] Yes Take 1-2 tablets (500-1,000 mg total) by mouth every 6 (six) hours as needed. Barrett, Bryan SAUNDERS, PA-C  Active   albuterol  (VENTOLIN  HFA) 108 (90 Base) MCG/ACT inhaler [id number] Yes Inhale 2 puffs into the lungs every 6 (six) hours as needed for shortness of breath. [provider]  Active Self  aspirin  81 MG chewable tablet [id number] Yes Chew 81 mg by mouth daily. [provider]  Active Self  Blood Glucose Monitoring Suppl (ACCU-CHEK GUIDE ME) w/Device KIT [id number] Yes CHECK BS DAILY DX E11.9 Severa Rock HERO, FNP  Active Self  clopidogrel (PLAVIX) 75 MG tablet [id number] Yes Take 1 tablet (75 mg total) by mouth daily.  Barrett, Bryan SAUNDERS, PA-C  Active   Continuous Glucose Sensor (DEXCOM G7 SENSOR) MISC [id number] Yes 1 Device by Does not apply route every 14 (fourteen) days. Severa Rock HERO, FNP  Active Self  furosemide  (LASIX ) 40 MG tablet [id number] Yes Take 1 tablet (40 mg total) by mouth daily. Barrett, Bryan SAUNDERS, PA-C  Active   glucose blood (FREESTYLE LITE) test strip [id number] Yes Check BS daily Dx E11.9 Severa Rock HERO, FNP  Active Self  metFORMIN  (GLUCOPHAGE ) 500 MG tablet [id number] Yes Take 1 tablet (500 mg total) by mouth 2 (two) times daily with a meal. Barrett, Bryan R, PA-C  Active   methocarbamol (ROBAXIN) 500 MG tablet [id number] Yes Take 1 tablet (500 mg total) by mouth every 8 (eight) hours as needed for muscle spasms. Barrett, Bryan SAUNDERS, PA-C  Active   metoprolol  tartrate (LOPRESSOR ) 25 MG tablet [id number]  Take 1 tablet (25 mg total) by mouth 2 (two) times daily. Barrett, Bryan SAUNDERS, PA-C  Active   potassium chloride  SA (KLOR-CON  M) 20 MEQ tablet [id number] Yes Take 1 tablet (20 mEq total) by mouth daily. Barrett, Bryan SAUNDERS, PA-C  Active   rosuvastatin  (CRESTOR ) 20 MG tablet [id number] Yes Take 1 tablet (20 mg total) by mouth daily at 6 PM. Barrett, Bryan R, PA-C  Active   traMADol  (ULTRAM ) 50 MG tablet [id number] Yes Take 1 tablet (50 mg total) by mouth every 4 (four) hours as needed for moderate pain (pain score 4-6). Barrett, Bryan SAUNDERS, PA-C  Active             Home Care and Equipment/Supplies: Were Home Health Services Ordered?: Yes Name of Home Health Agency:: Dr office  made the arrangements and pt not sure Has Agency set up a time to come to your home?: Yes First Home Health Visit Date: 09/07/24 Any new equipment or medical supplies ordered?: NA  Functional Questionnaire: Do you need assistance with bathing/showering or dressing?: No Do you need assistance with meal preparation?: Yes Do you need assistance with eating?: No Do you have difficulty maintaining continence: No Do you need assistance with  getting out of bed/getting out of a chair/moving?: No Do you have difficulty managing or taking your medications?: No  Follow up appointments reviewed: PCP Follow-up appointment confirmed?: No MD Provider Line Number:[phone] Given: Yes (wife is calling to make appt today) Specialist Hospital Follow-up appointment confirmed?: Yes Date of Specialist follow-up appointment?: 09/23/24 Follow-Up Specialty Provider:: [id number] Cardiac with Con RAMAN Su 12:00 PM  [id number] COURTNEY [facility] Wednesday Sep 29, 2024 8:30 AM Do you need transportation to your follow-up appointment?: No Do you understand care options if your condition(s) worsen?: Yes-patient verbalized understanding  SDOH Interventions Today    Flowsheet Row Most Recent Value  SDOH Interventions   Food Insecurity Interventions Intervention Not Indicated  Housing Interventions Intervention Not Indicated  Transportation Interventions Intervention Not Indicated, Patient Resources (Friends/Family)  Utilities Interventions Intervention Not Indicated   Discussed and offered 30 day TOC program.  Patient   declined.  The patient has been provided with contact information for the care management team and has been advised to call with any health -related questions or concerns.  The patient verbalized understanding with current plan of care.  The patient is directed to their insurance card regarding availability of benefits coverage   Bryan Shepard BSN RN [facility] Health Care Management Coordinator [email or web address] Direct Dial: [phone]  Fax: [phone] Website: [email or web address]

## 2024-09-08 MED FILL — Sodium Bicarbonate IV Soln 8.4%: INTRAVENOUS | Qty: 50 | Status: AC

## 2024-09-08 MED FILL — Heparin Sodium (Porcine) Inj 1000 Unit/ML: INTRAMUSCULAR | Qty: 30 | Status: AC

## 2024-09-08 MED FILL — Sodium Chloride IV Soln 0.9%: INTRAVENOUS | Qty: 2000 | Status: AC

## 2024-09-08 MED FILL — Electrolyte-R (PH 7.4) Solution: INTRAVENOUS | Qty: 4000 | Status: AC

## 2024-09-08 NOTE — Telephone Encounter (Unsigned)
 Copied from CRM [id number]. Topic: Appointments - Appointment Scheduling >> Sep 08, 2024  9:25 AM Diannia H wrote: Patient/patient representative is calling to schedule an appointment. Patient wife [place]  was in the hospital for a triple bypass surgery and he is needing a follow up. I can't book until the 18th, could you assist? Patient was discharged on Sunday 11/30. Patients callback number is [phone].

## 2024-09-10 ENCOUNTER — Ambulatory Visit: Payer: Self-pay

## 2024-09-10 NOTE — Telephone Encounter (Signed)
 noted

## 2024-09-10 NOTE — Telephone Encounter (Signed)
 Wife states a home health nurse will be arriving soon and she was going to see what they recommended and she would then take the patient to the hospital. She is advised that at any point if he gets worse she can call 911. She verbalized understanding.   FYI Only or Action Required?: FYI only for provider: ED advised --Wife states she is waiting for home health nurse to come and get her recommendation as well.  Patient was last seen in primary care on 05/05/2024 by Severa Rock HERO, FNP.  Called Nurse Triage reporting Shortness of Breath.  Symptoms began several days ago.  Interventions attempted: Rest, hydration, or home remedies.  Symptoms are: gradually worsening.  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   Reason for Disposition  Major surgery in the past month  Answer Assessment - Initial Assessment Questions Wife called to see about getting patient in sooner Patient is going to a GI in a few weeks Patient having a lot of trouble sleeping at night--even with CPAP Neuropathy has gotten worse--feet feel cold--patient states this was going on before the surgery but now he notices it more Difficult to get comfortable  Patient states extremely tired/zero energy Symptoms started after surgery 08/29/2024  Patient denies unilateral numbness of the face or arms Patient states he is having diarrhea frequently since being home from the hospital He denies abdominal pain or feeling unwell  He states he is starting to have soreness where his stitches are from surgery Patient did add that for the past few days he has been very short of breath. He states that  he went up the stairs and was very short of breath at that time and had to catch his breath. Given the recent cardiac history/recent surgery/week stay in the hospital/new onset of shortness of breath worse on exertion patient and his wife are advised that the ER is the recommendation at this time for further evaluation Patient is advised the recommendation of the ER and was agreeable. Wife states a home health nurse will be arriving soon and she was going to see what they recommended and she would then take the patient to the hospital. She is advised that at any point if he gets worse she can call 911. She verbalized understanding.  Protocols used: Breathing Difficulty-A-AH

## 2024-09-10 NOTE — Telephone Encounter (Signed)
 Called and spoke with CAL and spoke with Olam --advised her of the patient's symptoms/situation & she states she was going to give the information to the triage nurse

## 2024-09-11 ENCOUNTER — Emergency Department (HOSPITAL_COMMUNITY)

## 2024-09-11 ENCOUNTER — Encounter (HOSPITAL_COMMUNITY): Payer: Self-pay | Admitting: Emergency Medicine

## 2024-09-11 ENCOUNTER — Observation Stay (HOSPITAL_COMMUNITY)
Admission: EM | Admit: 2024-09-11 | Discharge: 2024-09-13 | DRG: 291 | Disposition: A | Attending: Family Medicine | Admitting: Family Medicine

## 2024-09-11 ENCOUNTER — Other Ambulatory Visit: Payer: Self-pay

## 2024-09-11 DIAGNOSIS — G4733 Obstructive sleep apnea (adult) (pediatric): Secondary | ICD-10-CM

## 2024-09-11 DIAGNOSIS — I509 Heart failure, unspecified: Secondary | ICD-10-CM | POA: Diagnosis not present

## 2024-09-11 DIAGNOSIS — E119 Type 2 diabetes mellitus without complications: Secondary | ICD-10-CM

## 2024-09-11 DIAGNOSIS — E785 Hyperlipidemia, unspecified: Secondary | ICD-10-CM | POA: Diagnosis not present

## 2024-09-11 DIAGNOSIS — R0602 Shortness of breath: Principal | ICD-10-CM

## 2024-09-11 DIAGNOSIS — I2510 Atherosclerotic heart disease of native coronary artery without angina pectoris: Secondary | ICD-10-CM

## 2024-09-11 LAB — RESP PANEL BY RT-PCR (RSV, FLU A&B, COVID)  RVPGX2
Influenza A by PCR: NEGATIVE
Influenza B by PCR: NEGATIVE
Resp Syncytial Virus by PCR: NEGATIVE
SARS Coronavirus 2 by RT PCR: NEGATIVE

## 2024-09-11 LAB — COMPREHENSIVE METABOLIC PANEL WITH GFR
ALT: 303 U/L — ABNORMAL HIGH (ref 0–44)
AST: 164 U/L — ABNORMAL HIGH (ref 15–41)
Albumin: 3.9 g/dL (ref 3.5–5.0)
Alkaline Phosphatase: 95 U/L (ref 38–126)
Anion gap: 16 — ABNORMAL HIGH (ref 5–15)
BUN: 30 mg/dL — ABNORMAL HIGH (ref 8–23)
CO2: 19 mmol/L — ABNORMAL LOW (ref 22–32)
Calcium: 9.3 mg/dL (ref 8.9–10.3)
Chloride: 99 mmol/L (ref 98–111)
Creatinine, Ser: 1.28 mg/dL — ABNORMAL HIGH (ref 0.61–1.24)
GFR, Estimated: >60 mL/min (ref >60)
Glucose, Bld: 174 mg/dL — ABNORMAL HIGH (ref 70–99)
Potassium: 4.5 mmol/L (ref 3.5–5.1)
Sodium: 134 mmol/L — ABNORMAL LOW (ref 135–145)
Total Bilirubin: 0.6 mg/dL (ref 0.0–1.2)
Total Protein: 7.1 g/dL (ref 6.5–8.1)

## 2024-09-11 LAB — TROPONIN T, HIGH SENSITIVITY
Troponin T High Sensitivity: 128 ng/L (ref 0–19)
Troponin T High Sensitivity: 129 ng/L (ref 0–19)

## 2024-09-11 LAB — CBC WITH DIFFERENTIAL/PLATELET
Abs Immature Granulocytes: 0.06 K/uL (ref 0.00–0.07)
Basophils Absolute: 0 K/uL (ref 0.0–0.1)
Basophils Relative: 0 %
Eosinophils Absolute: 0.1 K/uL (ref 0.0–0.5)
Eosinophils Relative: 1 %
HCT: 39.7 % (ref 39.0–52.0)
Hemoglobin: 13.4 g/dL (ref 13.0–17.0)
Immature Granulocytes: 1 %
Lymphocytes Relative: 18 %
Lymphs Abs: 2 K/uL (ref 0.7–4.0)
MCH: 28.8 pg (ref 26.0–34.0)
MCHC: 33.8 g/dL (ref 30.0–36.0)
MCV: 85.2 fL (ref 80.0–100.0)
Monocytes Absolute: 0.9 K/uL (ref 0.1–1.0)
Monocytes Relative: 8 %
Neutro Abs: 8 K/uL — ABNORMAL HIGH (ref 1.7–7.7)
Neutrophils Relative %: 72 %
Platelets: 421 K/uL — ABNORMAL HIGH (ref 150–400)
RBC: 4.66 MIL/uL (ref 4.22–5.81)
RDW: 13.1 % (ref 11.5–15.5)
WBC: 11 K/uL — ABNORMAL HIGH (ref 4.0–10.5)
nRBC: 0 % (ref 0.0–0.2)

## 2024-09-11 LAB — PRO BRAIN NATRIURETIC PEPTIDE: Pro Brain Natriuretic Peptide: 2578 pg/mL — ABNORMAL HIGH (ref <300.0)

## 2024-09-11 MED ORDER — ACETAMINOPHEN 325 MG PO TABS
650.0000 mg | ORAL_TABLET | Freq: Four times a day (QID) | ORAL | Status: DC | PRN
  Administered 2024-09-12: 650 mg via ORAL
  Filled 2024-09-11: qty 2

## 2024-09-11 MED ORDER — FUROSEMIDE 10 MG/ML IJ SOLN
40.0000 mg | Freq: Two times a day (BID) | INTRAMUSCULAR | Status: DC
  Administered 2024-09-12: 40 mg via INTRAVENOUS
  Filled 2024-09-11: qty 4

## 2024-09-11 MED ORDER — ALBUTEROL SULFATE (2.5 MG/3ML) 0.083% IN NEBU: 2.5000 mg | INHALATION_SOLUTION | Freq: Four times a day (QID) | RESPIRATORY_TRACT | Status: DC | PRN

## 2024-09-11 MED ORDER — ROSUVASTATIN CALCIUM 20 MG PO TABS
20.0000 mg | ORAL_TABLET | Freq: Every day | ORAL | Status: DC
  Administered 2024-09-12: 20 mg via ORAL
  Filled 2024-09-11: qty 1

## 2024-09-11 MED ORDER — METOPROLOL TARTRATE 25 MG PO TABS
25.0000 mg | ORAL_TABLET | Freq: Two times a day (BID) | ORAL | Status: DC
  Administered 2024-09-12 – 2024-09-13 (×3): 25 mg via ORAL
  Filled 2024-09-11 (×4): qty 1

## 2024-09-11 MED ORDER — IOHEXOL 350 MG/ML SOLN
75.0000 mL | Freq: Once | INTRAVENOUS | Status: AC | PRN
  Administered 2024-09-11: 75 mL via INTRAVENOUS

## 2024-09-11 MED ORDER — ENOXAPARIN SODIUM 40 MG/0.4ML IJ SOSY
40.0000 mg | PREFILLED_SYRINGE | INTRAMUSCULAR | Status: DC
  Administered 2024-09-12 – 2024-09-13 (×2): 40 mg via SUBCUTANEOUS
  Filled 2024-09-11 (×2): qty 0.4

## 2024-09-11 MED ORDER — TRAZODONE HCL 50 MG PO TABS
25.0000 mg | ORAL_TABLET | Freq: Every evening | ORAL | Status: DC | PRN
  Administered 2024-09-12: 25 mg via ORAL
  Filled 2024-09-11: qty 1

## 2024-09-11 MED ORDER — ASPIRIN 81 MG PO CHEW
81.0000 mg | CHEWABLE_TABLET | Freq: Every day | ORAL | Status: DC
  Administered 2024-09-12 – 2024-09-13 (×2): 81 mg via ORAL
  Filled 2024-09-11 (×2): qty 1

## 2024-09-11 MED ORDER — TRAMADOL HCL 50 MG PO TABS: 50.0000 mg | ORAL_TABLET | ORAL | Status: DC | PRN

## 2024-09-11 MED ORDER — ALBUTEROL SULFATE HFA 108 (90 BASE) MCG/ACT IN AERS: 2.0000 | INHALATION_SPRAY | Freq: Four times a day (QID) | RESPIRATORY_TRACT | Status: DC | PRN

## 2024-09-11 MED ORDER — CLOPIDOGREL BISULFATE 75 MG PO TABS
75.0000 mg | ORAL_TABLET | Freq: Every day | ORAL | Status: DC
  Administered 2024-09-12 – 2024-09-13 (×2): 75 mg via ORAL
  Filled 2024-09-11 (×2): qty 1

## 2024-09-11 MED ORDER — ONDANSETRON HCL 4 MG PO TABS: 4.0000 mg | ORAL_TABLET | Freq: Four times a day (QID) | ORAL | Status: DC | PRN

## 2024-09-11 MED ORDER — ONDANSETRON HCL 4 MG/2ML IJ SOLN: 4.0000 mg | Freq: Four times a day (QID) | INTRAMUSCULAR | Status: DC | PRN

## 2024-09-11 MED ORDER — ACETAMINOPHEN 650 MG RE SUPP: 650.0000 mg | Freq: Four times a day (QID) | RECTAL | Status: DC | PRN

## 2024-09-11 MED ORDER — MAGNESIUM HYDROXIDE 400 MG/5ML PO SUSP: 30.0000 mL | Freq: Every day | ORAL | Status: DC | PRN

## 2024-09-11 MED ORDER — METHOCARBAMOL 500 MG PO TABS: 500.0000 mg | ORAL_TABLET | Freq: Three times a day (TID) | ORAL | Status: DC | PRN

## 2024-09-11 MED ORDER — POTASSIUM CHLORIDE CRYS ER 20 MEQ PO TBCR
20.0000 meq | EXTENDED_RELEASE_TABLET | Freq: Every day | ORAL | Status: DC
  Administered 2024-09-12 – 2024-09-13 (×2): 20 meq via ORAL
  Filled 2024-09-11: qty 2
  Filled 2024-09-11: qty 1

## 2024-09-11 NOTE — ED Notes (Signed)
 CARDIO THORACIC PAGED X2 @ 2152

## 2024-09-11 NOTE — H&P (Incomplete)
 [place]   PATIENT NAME: Bryan Shepard    MR#:  [id number]  DATE OF BIRTH:  [birth year]  DATE OF ADMISSION:  09/11/2024  PRIMARY CARE PHYSICIAN: Severa Rock HERO, FNP   Patient is coming from: Home  REQUESTING/REFERRING PHYSICIAN: Neysa Thersia RAMAN, PA-C    CHIEF COMPLAINT:   Chief Complaint  Patient presents with   Weakness   Fatigue   Shortness of Breath    HISTORY OF PRESENT ILLNESS:  Bryan Corbit. is a 68 y.o. male with medical history significant for GERD, type 2 diabetes mellitus, essential hypertension and OSA on CPAP and CVA/TIA, who presented to the emergency room with acute onset of worsening dyspnea on exertion without cough or wheezing.  He denied any orthopnea or paroxysmal nocturnal dyspnea.  No fever or chills.  He underwent four-vessel CABG on 11/25 at [facility] and was discharged on 11/30.  He has been doing well until the last couple days when his symptoms started.  No dysuria, oliguria or hematuria or flank pain.  No fever or chills.  No headache or dizziness or blurred vision.  ED Course: When the patient came to the ER, vital signs were within normal.  Labs revealed sodium 134 and CO2 19 with blood glucose 174, BUN of 30 and creatinine 1.28 close to baseline with AG 16.  AST was 164 ALT 303.  proBNP is 2578.  CBC showed leukocytosis of 11 with neutrophilia and thrombocytosis of 421.  Respiratory panel came back negative. EKG as reviewed by me : EKG showed normal sinus rhythm with rate of 87 with borderline right axis deviation. Imaging: Portable chest x-ray showed no acute cardiopulmonary disease.   Chest CTA revealed no PE.  It showed small left pleural effusion and left lower lobe atelectasis.  The case was discussed with Dr. Sharie foot with cardiothoracic surgery and recommendation was for medical admission and cardiology evaluation.  Contact was also made with the cardiology fellow Dr. Debarah at [facility] who is aware about the patient being admitted.  The patient was  ordered IV Lasix .  He will be admitted to a telemetry observation bed at [facility] for further evaluation and management. PAST MEDICAL HISTORY:   Past Medical History:  Diagnosis Date   Acid reflux    Colon polyps    Diabetes mellitus type 2 in nonobese (HCC) 12/13/2020   Family history of genetic mutation for hereditary nonpolyposis colorectal cancer (HNPCC)    Family history of prostate cancer    Family history of stomach cancer    HOH (hard of hearing)    Hypertension    Sleep apnea    CPAP   Stroke (HCC) 2015   TIA (transient ischemic attack) 2016    PAST SURGICAL HISTORY:   Past Surgical History:  Procedure Laterality Date   CHOLECYSTECTOMY N/A 04/17/2020   Procedure: LAPAROSCOPIC CHOLECYSTECTOMY;  Surgeon: Mavis Anes, MD;  Location: [facility] ORS;  Service: General;  Laterality: N/A;   COLONOSCOPY  2018   COLONOSCOPY WITH PROPOFOL  N/A 12/07/2021   Surgeon: Cindie Dunnings K, DO;   nonbleeding internal hemorrhoids, 8 mm polyp (tubular adenoma) in the descending colon removed.  Recommended 5-year surveillance.   CORONARY ARTERY BYPASS GRAFT N/A 09/01/2024   Procedure: CORONARY ARTERY BYPASS GRAFTING X 4, USING LEFT INTERAL MAMMARY ARTERY AND ENDOSCOPIC HARVESTED LEFT GREATER SAPHENOUS VEIN;  Surgeon: Daniel Con RAMAN, MD;  Location: [facility] OR;  Service: Open Heart Surgery;  Laterality: N/A;   INTRAOPERATIVE TRANSESOPHAGEAL ECHOCARDIOGRAM N/A 09/01/2024  Procedure: ECHOCARDIOGRAM, TRANSESOPHAGEAL, INTRAOPERATIVE;  Surgeon: Daniel Con RAMAN, MD;  Location: [facility] OR;  Service: Open Heart Surgery;  Laterality: N/A;   LEFT HEART CATH AND CORONARY ANGIOGRAPHY N/A 08/30/2024   Procedure: LEFT HEART CATH AND CORONARY ANGIOGRAPHY;  Surgeon: Mady Bruckner, MD;  Location: [facility] INVASIVE CV LAB;  Service: Cardiovascular;  Laterality: N/A;   POLYPECTOMY  12/07/2021   Procedure: POLYPECTOMY;  Surgeon: Cindie Carlin POUR, DO;  Location: [facility] ENDO SUITE;  Service: Endoscopy;;    SOCIAL HISTORY:   Social History    Tobacco Use   Smoking status: Never   Smokeless tobacco: Never  Substance Use Topics   Alcohol use: No    FAMILY HISTORY:   Family History  Problem Relation Age of Onset   Melanoma Mother 24   Hypertension Mother    Transient ischemic attack Mother    Stroke Mother    Heart disease Mother    Prostate cancer Father 65   Stomach cancer Father 45       PMS2+; Lynch syndrome   Melanoma Father    Diabetes Father    Heart disease Father    Stroke Maternal Grandmother    Lung cancer Paternal Grandmother    Throat cancer Paternal Grandmother    Prostate cancer Paternal Grandfather 98   Bladder Cancer Other        MGM's mother with urethra cancer    DRUG ALLERGIES:  No Known Allergies  REVIEW OF SYSTEMS:   ROS As per history of present illness. All pertinent systems were reviewed above. Constitutional, HEENT, cardiovascular, respiratory, GI, GU, musculoskeletal, neuro, psychiatric, endocrine, integumentary and hematologic systems were reviewed and are otherwise negative/unremarkable except for positive findings mentioned above in the HPI.   MEDICATIONS AT HOME:   Prior to Admission medications   Medication Sig Start Date End Date Taking? Authorizing Provider  Accu-Chek FastClix Lancets MISC CHECK BS DAILY DX E11.9 01/07/24   Severa Rock HERO, FNP  acetaminophen  (TYLENOL ) 500 MG tablet Take 1-2 tablets (500-1,000 mg total) by mouth every 6 (six) hours as needed. 09/05/24   Barrett, Erin R, PA-C  albuterol  (VENTOLIN  HFA) 108 (90 Base) MCG/ACT inhaler Inhale 2 puffs into the lungs every 6 (six) hours as needed for shortness of breath. 06/19/24   [provider]  aspirin  81 MG chewable tablet Chew 81 mg by mouth daily.    [provider]  Blood Glucose Monitoring Suppl (ACCU-CHEK GUIDE ME) w/Device KIT CHECK BS DAILY DX E11.9 07/07/23   Rakes, Rock HERO, FNP  clopidogrel  (PLAVIX ) 75 MG tablet Take 1 tablet (75 mg total) by mouth daily. 09/05/24 09/05/25  Barrett, Erin  R, PA-C  Continuous Glucose Sensor (DEXCOM G7 SENSOR) MISC 1 Device by Does not apply route every 14 (fourteen) days. 05/05/24   Severa Rock HERO, FNP  furosemide  (LASIX ) 40 MG tablet Take 1 tablet (40 mg total) by mouth daily. 09/05/24   Barrett, Erin R, PA-C  glucose blood (FREESTYLE LITE) test strip Check BS daily Dx E11.9 01/07/24   Severa Rock HERO, FNP  metFORMIN  (GLUCOPHAGE ) 500 MG tablet Take 1 tablet (500 mg total) by mouth 2 (two) times daily with a meal. 09/05/24   Barrett, Erin R, PA-C  methocarbamol  (ROBAXIN ) 500 MG tablet Take 1 tablet (500 mg total) by mouth every 8 (eight) hours as needed for muscle spasms. 09/05/24   Barrett, Erin R, PA-C  metoprolol  tartrate (LOPRESSOR ) 25 MG tablet Take 1 tablet (25 mg total) by mouth 2 (two) times daily. 09/05/24  Barrett, Erin R, PA-C  potassium chloride  SA (KLOR-CON  M) 20 MEQ tablet Take 1 tablet (20 mEq total) by mouth daily. 09/05/24   Barrett, Rocky SAUNDERS, PA-C  rosuvastatin  (CRESTOR ) 20 MG tablet Take 1 tablet (20 mg total) by mouth daily at 6 PM. 09/05/24   Barrett, Erin R, PA-C  traMADol  (ULTRAM ) 50 MG tablet Take 1 tablet (50 mg total) by mouth every 4 (four) hours as needed for moderate pain (pain score 4-6). 09/05/24   Barrett, Erin R, PA-C      VITAL SIGNS:  Blood pressure 118/85, pulse 85, temperature 97.8 F (36.6 C), temperature source Oral, resp. rate 17, SpO2 94%.  PHYSICAL EXAMINATION:  Physical Exam  GENERAL:  68 y.o.-year-old Caucasian male patient lying in the bed with no acute distress.  EYES: Pupils equal, round, reactive to light and accommodation. No scleral icterus. Extraocular muscles intact.  HEENT: Head atraumatic, normocephalic. Oropharynx and nasopharynx clear.  NECK:  Supple, no jugular venous distention. No thyroid  enlargement, no tenderness.  LUNGS: Diminished bibasilar breath sounds with no wheezing, rales,rhonchi or crepitation. No use of accessory muscles of respiration.  CARDIOVASCULAR: Regular rate and rhythm,  S1, S2 normal. No murmurs, rubs, or gallops.  ABDOMEN: Soft, nondistended, nontender. Bowel sounds present. No organomegaly or mass.  EXTREMITIES: No pedal edema, cyanosis, or clubbing.  NEUROLOGIC: Cranial nerves II through XII are intact. Muscle strength 5/5 in all extremities. Sensation intact. Gait not checked.  PSYCHIATRIC: The patient is alert and oriented x 3.  Normal affect and good eye contact. SKIN: No obvious rash, lesion, or ulcer.   LABORATORY PANEL:   CBC Recent Labs  Lab 09/11/24 1850  WBC 11.0*  HGB 13.4  HCT 39.7  PLT 421*   ------------------------------------------------------------------------------------------------------------------  Chemistries  Recent Labs  Lab 09/11/24 1850  NA 134*  K 4.5  CL 99  CO2 19*  GLUCOSE 174*  BUN 30*  CREATININE 1.28*  CALCIUM  9.3  AST 164*  ALT 303*  ALKPHOS 95  BILITOT 0.6   ------------------------------------------------------------------------------------------------------------------  Cardiac Enzymes No results for input(s): TROPONINI in the last 168 hours. ------------------------------------------------------------------------------------------------------------------  RADIOLOGY:  CT Angio Chest PE W and/or Wo Contrast Result Date: 09/11/2024 EXAM: CTA of the Chest with contrast for PE 09/11/2024 07:50:59 PM TECHNIQUE: CTA of the chest was performed without and with the administration of 75 mL of iohexol  (OMNIPAQUE ) 350 MG/ML injection. Multiplanar reformatted images are provided for review. MIP images are provided for review. Automated exposure control, iterative reconstruction, and/or weight based adjustment of the mA/kV was utilized to reduce the radiation dose to as low as reasonably achievable. COMPARISON: 08/29/2024 CLINICAL HISTORY: Pulmonary embolism (PE) suspected, high prob. FINDINGS: PULMONARY ARTERIES: Pulmonary arteries are adequately opacified for evaluation. No pulmonary embolism. Main pulmonary  artery is normal in caliber. MEDIASTINUM: The heart demonstrates prior CABG. Aortic atherosclerosis. The pericardium demonstrates no acute abnormality. LYMPH NODES: No mediastinal, hilar or axillary lymphadenopathy. LUNGS AND PLEURA: Small left pleural effusion with left lower lobe atelectasis. No pneumothorax. UPPER ABDOMEN: Limited images of the upper abdomen are unremarkable. SOFT TISSUES AND BONES: No acute bone or soft tissue abnormality. IMPRESSION: 1. No pulmonary embolism. 2. Small left pleural effusion with left lower lobe atelectasis. Electronically signed by: Franky Crease MD 09/11/2024 08:00 PM EST RP Workstation: [id number]   DG Chest Portable 1 View Result Date: 09/11/2024 EXAM: 1 VIEW(S) XRAY OF THE CHEST 09/11/2024 06:24:00 PM COMPARISON: 6 days ago status post coronary artery bypass graft stable cardiomediastinal silhouette. CLINICAL HISTORY: dyspnea FINDINGS: LUNGS  AND PLEURA: No focal pulmonary opacity. No pleural effusion. No pneumothorax. HEART AND MEDIASTINUM: No acute abnormality of the cardiac and mediastinal silhouettes. BONES AND SOFT TISSUES: No acute osseous abnormality. IMPRESSION: 1. No acute cardiopulmonary process. Electronically signed by: Lynwood Seip MD 09/11/2024 06:32 PM EST RP Workstation: [id number]      IMPRESSION AND PLAN:  Assessment and Plan: * Acute CHF (congestive heart failure) (HCC) - This is apparently of new onset. - The patient will be admitted to an observation cardiac telemetry bed at [facility]. - Will continue diuresis with IV Lasix . - Will obtain a 2D echo. - Cardiology consult will be obtained.  Dr. Debarah was notified about the patient.  Coronary artery disease - The patient is status post four-vessel CABG on 08/31/2024. - We will continue beta-blocker therapy with Lopressor  as well as statin therapy, aspirin  and Plavix .,  OSA on CPAP Will resume CPAP nightly.  Type 2 diabetes mellitus without complications (HCC) - The patient will be placed on  supplemental coverage with NovoLog . - Will hold off metformin .  Dyslipidemia - Will continue statin therapy.   DVT prophylaxis: Lovenox .  Advanced Care Planning:  Code Status: full code.  Family Communication:  The plan of care was discussed in details with the patient (and family). I answered all questions. The patient agreed to proceed with the above mentioned plan. Further management will depend upon hospital course. Disposition Plan: Back to previous home environment Consults called: Cardiology consult. All the records are reviewed and case discussed with ED provider.  Status is: Observation  I certify that at the time of admission, it is my clinical judgment that the patient will require hospital care extending less than 2 midnights.                            Dispo: The patient is from: Home              Anticipated d/c is to: Home              Patient currently is not medically stable to d/c.              Difficult to place patient: No  [place]ison DELENA Peaches M.D on 09/12/2024 at 2:08 AM  [facility]   From 7 PM-7 AM, contact night-coverage [email or web address]  CC: Primary care physician; Severa Rock HERO, FNP

## 2024-09-11 NOTE — ED Notes (Signed)
 Pt ambulated to restroom independently with steady gait.

## 2024-09-11 NOTE — ED Provider Notes (Signed)
 [place] EMERGENCY DEPARTMENT AT [facility] Provider Note   CSN: [id number] Arrival date & time: 09/11/24  1719     Patient presents with: Weakness, Fatigue, and Shortness of Breath   Bryan Shepard. is a 68 y.o. male.   Patient with history of T2DM, HTN, HLD, CAD, 10 days post quadruple bypass, was doing well at home post-surgery, onset DOE over the last 3-4 days. Stairs he was managing in the home after discharge without symptoms, he has needed to stop on the landing to catch his breath. Getting up to the bathroom is challenging. No chest pain, cough, fever, nausea, vomiting. No peripheral edema.  The history is provided by the patient. No language interpreter was used.  Weakness Associated symptoms: shortness of breath   Shortness of Breath      Prior to Admission medications   Medication Sig Start Date End Date Taking? Authorizing Provider  Accu-Chek FastClix Lancets MISC CHECK BS DAILY DX E11.9 01/07/24   Severa Rock HERO, FNP  acetaminophen  (TYLENOL ) 500 MG tablet Take 1-2 tablets (500-1,000 mg total) by mouth every 6 (six) hours as needed. 09/05/24   Barrett, Rocky SAUNDERS, PA-C  albuterol  (VENTOLIN  HFA) 108 (90 Base) MCG/ACT inhaler Inhale 2 puffs into the lungs every 6 (six) hours as needed for shortness of breath. 06/19/24   [provider]  aspirin  81 MG chewable tablet Chew 81 mg by mouth daily.    [provider]  Blood Glucose Monitoring Suppl (ACCU-CHEK GUIDE ME) w/Device KIT CHECK BS DAILY DX E11.9 07/07/23   Rakes, Rock HERO, FNP  clopidogrel  (PLAVIX ) 75 MG tablet Take 1 tablet (75 mg total) by mouth daily. 09/05/24 09/05/25  Barrett, Erin R, PA-C  Continuous Glucose Sensor (DEXCOM G7 SENSOR) MISC 1 Device by Does not apply route every 14 (fourteen) days. 05/05/24   Severa Rock HERO, FNP  furosemide  (LASIX ) 40 MG tablet Take 1 tablet (40 mg total) by mouth daily. 09/05/24   Barrett, Erin R, PA-C  glucose blood (FREESTYLE LITE) test strip Check BS  daily Dx E11.9 01/07/24   Severa Rock HERO, FNP  metFORMIN  (GLUCOPHAGE ) 500 MG tablet Take 1 tablet (500 mg total) by mouth 2 (two) times daily with a meal. 09/05/24   Barrett, Erin R, PA-C  methocarbamol  (ROBAXIN ) 500 MG tablet Take 1 tablet (500 mg total) by mouth every 8 (eight) hours as needed for muscle spasms. 09/05/24   Barrett, Erin R, PA-C  metoprolol  tartrate (LOPRESSOR ) 25 MG tablet Take 1 tablet (25 mg total) by mouth 2 (two) times daily. 09/05/24   Barrett, Erin R, PA-C  potassium chloride  SA (KLOR-CON  M) 20 MEQ tablet Take 1 tablet (20 mEq total) by mouth daily. 09/05/24   Barrett, Erin R, PA-C  rosuvastatin  (CRESTOR ) 20 MG tablet Take 1 tablet (20 mg total) by mouth daily at 6 PM. 09/05/24   Barrett, Erin R, PA-C  traMADol  (ULTRAM ) 50 MG tablet Take 1 tablet (50 mg total) by mouth every 4 (four) hours as needed for moderate pain (pain score 4-6). 09/05/24   Barrett, Rocky SAUNDERS, PA-C    Allergies: Patient has no known allergies.    Review of Systems  Respiratory:  Positive for shortness of breath.   Neurological:  Positive for weakness.    Updated Vital Signs BP 106/76   Pulse 83   Temp 97.9 F (36.6 C)   Resp 16   SpO2 94%   Physical Exam Constitutional:      Appearance: He is  well-developed.  HENT:     Head: Normocephalic.  Cardiovascular:     Rate and Rhythm: Normal rate and regular rhythm.     Heart sounds: No murmur heard. Pulmonary:     Effort: Pulmonary effort is normal.     Breath sounds: Normal breath sounds. No wheezing, rhonchi or rales.     Comments: Midline sternal surgical incision without dehiscence or erythema.  Abdominal:     General: Bowel sounds are normal.     Palpations: Abdomen is soft.     Tenderness: There is no abdominal tenderness. There is no guarding or rebound.  Musculoskeletal:        General: Normal range of motion.     Cervical back: Normal range of motion and neck supple.     Right lower leg: No edema.     Left lower leg: No edema.   Skin:    General: Skin is warm and dry.  Neurological:     General: No focal deficit present.     Mental Status: He is alert and oriented to person, place, and time.     (all labs ordered are listed, but only abnormal results are displayed) Labs Reviewed  RESP PANEL BY RT-PCR (RSV, FLU A&B, COVID)  [id number]  CBC WITH DIFFERENTIAL/PLATELET  COMPREHENSIVE METABOLIC PANEL WITH GFR  PRO BRAIN NATRIURETIC PEPTIDE  TROPONIN T, HIGH SENSITIVITY    EKG: None  Radiology: DG Chest Portable 1 View Result Date: 09/11/2024 EXAM: 1 VIEW(S) XRAY OF THE CHEST 09/11/2024 06:24:00 PM COMPARISON: 6 days ago status post coronary artery bypass graft stable cardiomediastinal silhouette. CLINICAL HISTORY: dyspnea FINDINGS: LUNGS AND PLEURA: No focal pulmonary opacity. No pleural effusion. No pneumothorax. HEART AND MEDIASTINUM: No acute abnormality of the cardiac and mediastinal silhouettes. BONES AND SOFT TISSUES: No acute osseous abnormality. IMPRESSION: 1. No acute cardiopulmonary process. Electronically signed by: Lynwood Seip MD 09/11/2024 06:32 PM EST RP Workstation: [id number]     Procedures   Medications Ordered in the ED - No data to display  Clinical Course as of 09/11/24 1841  Sat Sep 11, 2024  1809 Patient to ED with DOE x 3-4 days. S/P NSTEMI, CABG x 4 11/26. Doing well after discharge home on 11/30 until 3 days ago when he became SOB with any exertion. No fever, pain, vomiting, cough. Labs pending, CXR, EKG. [SU]  1839 Labs pending. Patient care signed out to Bryan Salt, PA-C, pending review of results, consideration of other appropriate tests, and consult with cardiothoracic given proximity of recent surgery.  [SU]    Clinical Course User Index [SU] Odell Balls, PA-C                                 Medical Decision Making Amount and/or Complexity of Data Reviewed Labs: ordered. Radiology: ordered.        Final diagnoses:  Shortness of breath    ED Discharge Orders      None          Odell Balls, PA-C 09/11/24 1841    Melvenia Motto, MD 09/12/24 [phone]

## 2024-09-11 NOTE — ED Provider Notes (Signed)
 Patient received in signout at shift change from [facility], PA-C.  Briefly, patient was noted to be admitted for recent NSTEMI on 11/26 with CABG x 4.  He went home on 11/30 and had been doing well except for recent shortness of breath with exertion for the past 2 days.  States he has been compliant with his Plavix  as well as Lasix  40 mg daily.     Physical Exam  BP 107/82   Pulse 88   Temp 97.9 F (36.6 C)   Resp (!) 24   SpO2 96%   Physical Exam Constitutional:      Appearance: He is well-developed.  HENT:     Head: Normocephalic and atraumatic.  Cardiovascular:     Rate and Rhythm: Normal rate.  Pulmonary:     Effort: Pulmonary effort is normal.  Skin:    General: Skin is warm and dry.  Neurological:     Mental Status: He is alert and oriented to person, place, and time.  Psychiatric:        Mood and Affect: Mood normal.        Behavior: Behavior normal.       ED Course / MDM   Clinical Course as of 09/11/24 2354  Sat Sep 11, 2024  1809 Patient to ED with DOE x 3-4 days. S/P NSTEMI, CABG x 4 11/26. Doing well after discharge home on 11/30 until 3 days ago when he became SOB with any exertion. No fever, pain, vomiting, cough. Labs pending, CXR, EKG. [SU]  1839 Labs pending. Patient care signed out to Thersia Salt, PA-C, pending review of results, consideration of other appropriate tests, and consult with cardiothoracic given proximity of recent surgery.  [SU]  2259 Consulted Dr. Shyrl on call with cardiothoracic surgery: Advised that there is most likely nothing surgical at their standpoint but there would be concerns for the effusion and elevated BNP.  Advised to have patient admitted for echo and cardiac workup. [AY]    Clinical Course User Index [AY] Salt Thersia RAMAN, PA-C [SU] Odell Balls, PA-C   Medical Decision Making Workup found to be significant for elevated troponins of 120 and 129.  BNP also acutely elevated at 2500. Dr. Shyrl on-call with  cardiothoracic surgery consulted.  He advised that this is most likely not an acute surgical process but there would be concerns for CHF in the setting of recent surgery.  He advised to have patient admitted for an echocardiogram and reevaluation. Dr. Debarah on-call with cardiology was consulted who advised they would be happy to consult on patient at [facility], but did recommend a general medicine admission.  Hospitalist at [facility] has been consulted and are agreeable to admit patient for further evaluation and management.  Plan is to transfer patient to [facility] for further workup.  Patient stable while in ED and awaiting transfer.   Amount and/or Complexity of Data Reviewed Labs: ordered. Radiology: ordered.  Risk Prescription drug management. Decision regarding hospitalization.          Salt Thersia RAMAN, PA-C 09/11/24 2354    Melvenia Motto, MD 09/12/24 [phone]

## 2024-09-11 NOTE — ED Triage Notes (Signed)
 Pt bib rcems w/ c/o of weakness, fatigue and shortness of breath with and without exertion. Pt is 6days post CABG. PT denies any pain

## 2024-09-11 NOTE — ED Notes (Signed)
 Patient transported to CT

## 2024-09-12 ENCOUNTER — Observation Stay (HOSPITAL_COMMUNITY)

## 2024-09-12 DIAGNOSIS — Z951 Presence of aortocoronary bypass graft: Secondary | ICD-10-CM | POA: Diagnosis not present

## 2024-09-12 DIAGNOSIS — M720 Palmar fascial fibromatosis [Dupuytren]: Secondary | ICD-10-CM | POA: Diagnosis present

## 2024-09-12 DIAGNOSIS — I7121 Aneurysm of the ascending aorta, without rupture: Secondary | ICD-10-CM | POA: Diagnosis present

## 2024-09-12 DIAGNOSIS — J9811 Atelectasis: Secondary | ICD-10-CM | POA: Diagnosis present

## 2024-09-12 DIAGNOSIS — I130 Hypertensive heart and chronic kidney disease with heart failure and stage 1 through stage 4 chronic kidney disease, or unspecified chronic kidney disease: Secondary | ICD-10-CM | POA: Diagnosis present

## 2024-09-12 DIAGNOSIS — I509 Heart failure, unspecified: Secondary | ICD-10-CM | POA: Diagnosis not present

## 2024-09-12 DIAGNOSIS — N183 Chronic kidney disease, stage 3 unspecified: Secondary | ICD-10-CM | POA: Diagnosis present

## 2024-09-12 DIAGNOSIS — R0602 Shortness of breath: Secondary | ICD-10-CM | POA: Diagnosis present

## 2024-09-12 DIAGNOSIS — H9190 Unspecified hearing loss, unspecified ear: Secondary | ICD-10-CM | POA: Diagnosis present

## 2024-09-12 DIAGNOSIS — N179 Acute kidney failure, unspecified: Secondary | ICD-10-CM | POA: Diagnosis present

## 2024-09-12 DIAGNOSIS — E1122 Type 2 diabetes mellitus with diabetic chronic kidney disease: Secondary | ICD-10-CM | POA: Diagnosis present

## 2024-09-12 DIAGNOSIS — E119 Type 2 diabetes mellitus without complications: Secondary | ICD-10-CM | POA: Diagnosis not present

## 2024-09-12 DIAGNOSIS — Z1152 Encounter for screening for COVID-19: Secondary | ICD-10-CM | POA: Diagnosis not present

## 2024-09-12 DIAGNOSIS — I5031 Acute diastolic (congestive) heart failure: Secondary | ICD-10-CM | POA: Diagnosis present

## 2024-09-12 DIAGNOSIS — I2510 Atherosclerotic heart disease of native coronary artery without angina pectoris: Secondary | ICD-10-CM | POA: Diagnosis present

## 2024-09-12 DIAGNOSIS — D75839 Thrombocytosis, unspecified: Secondary | ICD-10-CM | POA: Diagnosis present

## 2024-09-12 DIAGNOSIS — I10 Essential (primary) hypertension: Secondary | ICD-10-CM | POA: Diagnosis not present

## 2024-09-12 DIAGNOSIS — D72829 Elevated white blood cell count, unspecified: Secondary | ICD-10-CM | POA: Diagnosis present

## 2024-09-12 DIAGNOSIS — G4733 Obstructive sleep apnea (adult) (pediatric): Secondary | ICD-10-CM | POA: Diagnosis present

## 2024-09-12 DIAGNOSIS — Z7984 Long term (current) use of oral hypoglycemic drugs: Secondary | ICD-10-CM | POA: Diagnosis not present

## 2024-09-12 DIAGNOSIS — Z7982 Long term (current) use of aspirin: Secondary | ICD-10-CM | POA: Diagnosis not present

## 2024-09-12 DIAGNOSIS — I252 Old myocardial infarction: Secondary | ICD-10-CM | POA: Diagnosis not present

## 2024-09-12 DIAGNOSIS — E785 Hyperlipidemia, unspecified: Secondary | ICD-10-CM | POA: Diagnosis present

## 2024-09-12 DIAGNOSIS — Z7902 Long term (current) use of antithrombotics/antiplatelets: Secondary | ICD-10-CM | POA: Diagnosis not present

## 2024-09-12 DIAGNOSIS — I340 Nonrheumatic mitral (valve) insufficiency: Secondary | ICD-10-CM | POA: Diagnosis present

## 2024-09-12 DIAGNOSIS — K219 Gastro-esophageal reflux disease without esophagitis: Secondary | ICD-10-CM | POA: Diagnosis present

## 2024-09-12 DIAGNOSIS — R7401 Elevation of levels of liver transaminase levels: Secondary | ICD-10-CM | POA: Diagnosis present

## 2024-09-12 DIAGNOSIS — E1165 Type 2 diabetes mellitus with hyperglycemia: Secondary | ICD-10-CM | POA: Diagnosis present

## 2024-09-12 LAB — GLUCOSE, CAPILLARY
Glucose-Capillary: 159 mg/dL — ABNORMAL HIGH (ref 70–99)
Glucose-Capillary: 199 mg/dL — ABNORMAL HIGH (ref 70–99)

## 2024-09-12 LAB — HEPATIC FUNCTION PANEL
ALT: 273 U/L — ABNORMAL HIGH (ref 0–44)
AST: 126 U/L — ABNORMAL HIGH (ref 15–41)
Albumin: 3.9 g/dL (ref 3.5–5.0)
Alkaline Phosphatase: 96 U/L (ref 38–126)
Bilirubin, Direct: 0.5 mg/dL — ABNORMAL HIGH (ref 0.0–0.2)
Indirect Bilirubin: 0.4 mg/dL (ref 0.3–0.9)
Total Bilirubin: 0.8 mg/dL (ref 0.0–1.2)
Total Protein: 6.9 g/dL (ref 6.5–8.1)

## 2024-09-12 LAB — BASIC METABOLIC PANEL WITH GFR
Anion gap: 11 (ref 5–15)
BUN: 29 mg/dL — ABNORMAL HIGH (ref 8–23)
CO2: 26 mmol/L (ref 22–32)
Calcium: 9.1 mg/dL (ref 8.9–10.3)
Chloride: 97 mmol/L — ABNORMAL LOW (ref 98–111)
Creatinine, Ser: 1.34 mg/dL — ABNORMAL HIGH (ref 0.61–1.24)
GFR, Estimated: 58 mL/min — ABNORMAL LOW (ref >60)
Glucose, Bld: 217 mg/dL — ABNORMAL HIGH (ref 70–99)
Potassium: 4 mmol/L (ref 3.5–5.1)
Sodium: 133 mmol/L — ABNORMAL LOW (ref 135–145)

## 2024-09-12 LAB — ECHOCARDIOGRAM COMPLETE
Area-P 1/2: 3.45 cm2
Calc EF: 64.7 %
S' Lateral: 1.9 cm
Single Plane A2C EF: 62 %
Single Plane A4C EF: 66.9 %

## 2024-09-12 LAB — CBG MONITORING, ED
Glucose-Capillary: 185 mg/dL — ABNORMAL HIGH (ref 70–99)
Glucose-Capillary: 208 mg/dL — ABNORMAL HIGH (ref 70–99)

## 2024-09-12 LAB — CBC
HCT: 39.8 % (ref 39.0–52.0)
Hemoglobin: 13.6 g/dL (ref 13.0–17.0)
MCH: 28.9 pg (ref 26.0–34.0)
MCHC: 34.2 g/dL (ref 30.0–36.0)
MCV: 84.7 fL (ref 80.0–100.0)
Platelets: 458 K/uL — ABNORMAL HIGH (ref 150–400)
RBC: 4.7 MIL/uL (ref 4.22–5.81)
RDW: 13.2 % (ref 11.5–15.5)
WBC: 12.5 K/uL — ABNORMAL HIGH (ref 4.0–10.5)
nRBC: 0 % (ref 0.0–0.2)

## 2024-09-12 MED ORDER — PERFLUTREN LIPID MICROSPHERE
1.0000 mL | INTRAVENOUS | Status: AC | PRN
  Administered 2024-09-12: 2 mL via INTRAVENOUS

## 2024-09-12 MED ORDER — INSULIN ASPART 100 UNIT/ML IJ SOLN
0.0000 [IU] | Freq: Three times a day (TID) | INTRAMUSCULAR | Status: DC
  Administered 2024-09-12: 5 [IU] via SUBCUTANEOUS
  Administered 2024-09-12 – 2024-09-13 (×3): 3 [IU] via SUBCUTANEOUS
  Filled 2024-09-12 (×4): qty 1

## 2024-09-12 MED ORDER — FUROSEMIDE 10 MG/ML IJ SOLN
40.0000 mg | Freq: Every day | INTRAMUSCULAR | Status: AC
  Administered 2024-09-12 – 2024-09-13 (×2): 40 mg via INTRAVENOUS
  Filled 2024-09-12 (×2): qty 4

## 2024-09-12 MED ORDER — FUROSEMIDE 10 MG/ML IJ SOLN: 40.0000 mg | Freq: Every day | INTRAMUSCULAR | Status: DC

## 2024-09-12 MED ORDER — INSULIN ASPART 100 UNIT/ML IJ SOLN: 0.0000 [IU] | Freq: Every day | INTRAMUSCULAR | Status: DC

## 2024-09-12 MED ORDER — LORAZEPAM 2 MG/ML IJ SOLN
0.5000 mg | INTRAMUSCULAR | Status: DC | PRN
  Administered 2024-09-12: 0.5 mg via INTRAVENOUS
  Filled 2024-09-12: qty 1

## 2024-09-12 NOTE — Hospital Course (Signed)
 68 y.o. male with medical history significant for GERD, type 2 diabetes mellitus, essential hypertension and OSA on CPAP and CVA/TIA, who presented to the emergency room with acute onset of worsening dyspnea on exertion without cough or wheezing.  He denied any orthopnea or paroxysmal nocturnal dyspnea.  No fever or chills.  He underwent four-vessel CABG on 11/25 at [facility] and was discharged on 11/30.  He has been doing well until the last couple days when his symptoms started.  No dysuria, oliguria or hematuria or flank pain.  No fever or chills.  No headache or dizziness or blurred vision.   ED Course: When the patient came to the ER, vital signs were within normal.  Labs revealed sodium 134 and CO2 19 with blood glucose 174, BUN of 30 and creatinine 1.28 close to baseline with AG 16.  AST was 164 ALT 303.  proBNP is 2578.  CBC showed leukocytosis of 11 with neutrophilia and thrombocytosis of 421.  Respiratory panel came back negative. EKG showed normal sinus rhythm with rate of 87 with borderline right axis deviation. Portable chest x-ray showed no acute cardiopulmonary disease.   Chest CTA revealed no PE.  It showed small left pleural effusion and left lower lobe atelectasis.   The case was discussed with Dr. Sharie foot with cardiothoracic surgery and recommendation was for medical admission and cardiology evaluation.  Contact was also made with the cardiology fellow Dr. Debarah at [facility] who is aware about the patient being admitted.  The patient was ordered IV Lasix .  He will be admitted to a telemetry observation bed at [facility] for further evaluation and management.

## 2024-09-12 NOTE — Assessment & Plan Note (Signed)
-   The patient is status post four-vessel CABG on 08/31/2024. - We will continue beta-blocker therapy with Lopressor  as well as statin therapy, aspirin  and Plavix .,

## 2024-09-12 NOTE — Progress Notes (Signed)
  Echocardiogram 2D Echocardiogram has been performed.  Tinnie FORBES Gosling RDCS 09/12/2024, 11:08 AM

## 2024-09-12 NOTE — Assessment & Plan Note (Signed)
-   This is apparently of new onset. - The patient will be admitted to an observation cardiac telemetry bed at [facility]. - Will continue diuresis with IV Lasix . - Will obtain a 2D echo. - Cardiology consult will be obtained.  Dr. Debarah was notified about the patient.

## 2024-09-12 NOTE — Assessment & Plan Note (Addendum)
-   The patient will be placed on supplemental coverage with NovoLog. - Will hold off metformin.

## 2024-09-12 NOTE — ED Notes (Signed)
 Pt independently ambulated to bathroom Pt hooked book up to cardiac monitor and BP set for Q2hr

## 2024-09-12 NOTE — Plan of Care (Signed)
   Problem: Coping: Goal: Ability to adjust to condition or change in health will improve Outcome: Progressing   Problem: Skin Integrity: Goal: Risk for impaired skin integrity will decrease Outcome: Progressing

## 2024-09-12 NOTE — Assessment & Plan Note (Signed)
 Will continue statin therapy

## 2024-09-12 NOTE — Assessment & Plan Note (Signed)
-   Will resume CPAP nightly.

## 2024-09-12 NOTE — Progress Notes (Signed)
 PROGRESS NOTE   Bryan Shepard.  FMW:[id number] DOB: January 20, 1956 DOA: 09/11/2024 PCP: Severa Rock HERO, FNP   Chief Complaint  Patient presents with   Weakness   Fatigue   Shortness of Breath   Level of care: Telemetry  Brief Admission History:  68 y.o. male with medical history significant for GERD, type 2 diabetes mellitus, essential hypertension and OSA on CPAP and CVA/TIA, who presented to the emergency room with acute onset of worsening dyspnea on exertion without cough or wheezing.  He denied any orthopnea or paroxysmal nocturnal dyspnea.  No fever or chills.  He underwent four-vessel CABG on 11/25 at [facility] and was discharged on 11/30.  He has been doing well until the last couple days when his symptoms started.  No dysuria, oliguria or hematuria or flank pain.  No fever or chills.  No headache or dizziness or blurred vision.   ED Course: When the patient came to the ER, vital signs were within normal.  Labs revealed sodium 134 and CO2 19 with blood glucose 174, BUN of 30 and creatinine 1.28 close to baseline with AG 16.  AST was 164 ALT 303.  proBNP is 2578.  CBC showed leukocytosis of 11 with neutrophilia and thrombocytosis of 421.  Respiratory panel came back negative. EKG showed normal sinus rhythm with rate of 87 with borderline right axis deviation. Portable chest x-ray showed no acute cardiopulmonary disease.   Chest CTA revealed no PE.  It showed small left pleural effusion and left lower lobe atelectasis.   The case was discussed with Dr. Sharie foot with cardiothoracic surgery and recommendation was for medical admission and cardiology evaluation.  Contact was also made with the cardiology fellow Dr. Debarah at [facility] who is aware about the patient being admitted.  The patient was ordered IV Lasix .  He will be admitted to a telemetry observation bed at [facility] for further evaluation and management.   Assessment and Plan:  Acute CHF (congestive heart failure)  - mild and responding well to  IV lasix  and clinically much improved  - The patient was admitted to an observation cardiac telemetry bed at [facility]. - Will continue diuresis with IV Lasix  40 mg once daily  - Follow up 2D echo (pending) - Cardiology consult will be obtained.  Dr. Debarah was notified about the patient.  Type 2 diabetes mellitus without complications  - The patient will be placed on supplemental coverage with NovoLog . - Will hold off metformin  CBG (last 3)  Recent Labs    09/12/24 0820  GLUCAP 185*   Dyslipidemia - continue statin therapy.  Coronary artery disease - The patient is status post four-vessel CABG on 08/31/2024. - We will continue beta-blocker therapy with Lopressor  as well as statin therapy, aspirin  and Plavix .,  OSA on CPAP Will resume CPAP nightly.  DVT prophylaxis: enoxaparin  Code Status: Full  Family Communication:  Disposition: awaiting for bed at [facility]   Consultants:  cardiology Procedures:   Antimicrobials:    Subjective: Pt reports he is feeling better after diuresing. No CP. No SOB.  No palpitations.   Objective: Vitals:   09/12/24 0900 09/12/24 0915 09/12/24 0916 09/12/24 0947  BP: 123/79 116/83 116/82   Pulse:  85 88   Resp:   20   Temp:    98.1 F (36.7 C)  TempSrc:    Oral  SpO2:  94% 96%    No intake or output data in the 24 hours ending 09/12/24 1047 There were no vitals filed  for this visit. Examination:  General exam: Appears calm and comfortable  Respiratory system: Clear to auscultation. Respiratory effort normal. Wounds healing well.  Cardiovascular system: normal S1 & S2 heard. No JVD, murmurs, rubs, gallops or clicks. No pedal edema. Gastrointestinal system: Abdomen is nondistended, soft and nontender. No organomegaly or masses felt. Normal bowel sounds heard. Central nervous system: Alert and oriented. No focal neurological deficits. Extremities: trace edema in legs. Symmetric 5 x 5 power. Skin: No rashes, lesions or ulcers. Psychiatry: Judgement  and insight appear normal. Mood & affect appropriate.   Data Reviewed: I have personally reviewed following labs and imaging studies  CBC: Recent Labs  Lab 09/11/24 1850 09/12/24 0416  WBC 11.0* 12.5*  NEUTROABS 8.0*  --   HGB 13.4 13.6  HCT 39.7 39.8  MCV 85.2 84.7  PLT 421* 458*    Basic Metabolic Panel: Recent Labs  Lab 09/11/24 1850 09/12/24 0416  NA 134* 133*  K 4.5 4.0  CL 99 97*  CO2 19* 26  GLUCOSE 174* 217*  BUN 30* 29*  CREATININE 1.28* 1.34*  CALCIUM  9.3 9.1    CBG: Recent Labs  Lab 09/12/24 0820  GLUCAP 185*    Recent Results (from the past 240 hours)  Resp panel by RT-PCR (RSV, Flu A&B, Covid) Anterior Nasal Swab     Status: None   Collection Time: 09/11/24  6:30 PM   Specimen: Anterior Nasal Swab  Result Value Ref Range Status   SARS Coronavirus 2 by RT PCR NEGATIVE NEGATIVE Final    Comment: (NOTE) SARS-CoV-2 target nucleic acids are NOT DETECTED.  The SARS-CoV-2 RNA is generally detectable in upper respiratory specimens during the acute phase of infection. The lowest concentration of SARS-CoV-2 viral copies this assay can detect is 138 copies/mL. A negative result does not preclude SARS-Cov-2 infection and should not be used as the sole basis for treatment or other patient management decisions. A negative result may occur with  improper specimen collection/handling, submission of specimen other than nasopharyngeal swab, presence of viral mutation(s) within the areas targeted by this assay, and inadequate number of viral copies(<138 copies/mL). A negative result must be combined with clinical observations, patient history, and epidemiological information. The expected result is Negative.  Fact Sheet for Patients:  bloggercourse.com  Fact Sheet for Healthcare Providers:  seriousbroker.it  This test is no t yet approved or cleared by the [place]  FDA and  has been authorized for  detection and/or diagnosis of SARS-CoV-2 by FDA under an Emergency Use Authorization (EUA). This EUA will remain  in effect (meaning this test can be used) for the duration of the COVID-19 declaration under Section 564(b)(1) of the Act, 21 U.S.C.section 360bbb-3(b)(1), unless the authorization is terminated  or revoked sooner.       Influenza A by PCR NEGATIVE NEGATIVE Final   Influenza B by PCR NEGATIVE NEGATIVE Final    Comment: (NOTE) The Xpert Xpress SARS-CoV-2/FLU/RSV plus assay is intended as an aid in the diagnosis of influenza from Nasopharyngeal swab specimens and should not be used as a sole basis for treatment. Nasal washings and aspirates are unacceptable for Xpert Xpress SARS-CoV-2/FLU/RSV testing.  Fact Sheet for Patients: bloggercourse.com  Fact Sheet for Healthcare Providers: seriousbroker.it  This test is not yet approved or cleared by the [place]  FDA and has been authorized for detection and/or diagnosis of SARS-CoV-2 by FDA under an Emergency Use Authorization (EUA). This EUA will remain in effect (meaning this test can be used) for the duration  of the COVID-19 declaration under Section 564(b)(1) of the Act, 21 U.S.C. section 360bbb-3(b)(1), unless the authorization is terminated or revoked.     Resp Syncytial Virus by PCR NEGATIVE NEGATIVE Final    Comment: (NOTE) Fact Sheet for Patients: bloggercourse.com  Fact Sheet for Healthcare Providers: seriousbroker.it  This test is not yet approved or cleared by the [place]  FDA and has been authorized for detection and/or diagnosis of SARS-CoV-2 by FDA under an Emergency Use Authorization (EUA). This EUA will remain in effect (meaning this test can be used) for the duration of the COVID-19 declaration under Section 564(b)(1) of the Act, 21 U.S.C. section 360bbb-3(b)(1), unless the authorization is  terminated or revoked.  Performed at [facility], [address] [place], [place] [address]      Radiology Studies: CT Angio Chest PE W and/or Wo Contrast Result Date: 09/11/2024 EXAM: CTA of the Chest with contrast for PE 09/11/2024 07:50:59 PM TECHNIQUE: CTA of the chest was performed without and with the administration of 75 mL of iohexol  (OMNIPAQUE ) 350 MG/ML injection. Multiplanar reformatted images are provided for review. MIP images are provided for review. Automated exposure control, iterative reconstruction, and/or weight based adjustment of the mA/kV was utilized to reduce the radiation dose to as low as reasonably achievable. COMPARISON: 08/29/2024 CLINICAL HISTORY: Pulmonary embolism (PE) suspected, high prob. FINDINGS: PULMONARY ARTERIES: Pulmonary arteries are adequately opacified for evaluation. No pulmonary embolism. Main pulmonary artery is normal in caliber. MEDIASTINUM: The heart demonstrates prior CABG. Aortic atherosclerosis. The pericardium demonstrates no acute abnormality. LYMPH NODES: No mediastinal, hilar or axillary lymphadenopathy. LUNGS AND PLEURA: Small left pleural effusion with left lower lobe atelectasis. No pneumothorax. UPPER ABDOMEN: Limited images of the upper abdomen are unremarkable. SOFT TISSUES AND BONES: No acute bone or soft tissue abnormality. IMPRESSION: 1. No pulmonary embolism. 2. Small left pleural effusion with left lower lobe atelectasis. Electronically signed by: Franky Crease MD 09/11/2024 08:00 PM EST RP Workstation: [id number]   DG Chest Portable 1 View Result Date: 09/11/2024 EXAM: 1 VIEW(S) XRAY OF THE CHEST 09/11/2024 06:24:00 PM COMPARISON: 6 days ago status post coronary artery bypass graft stable cardiomediastinal silhouette. CLINICAL HISTORY: dyspnea FINDINGS: LUNGS AND PLEURA: No focal pulmonary opacity. No pleural effusion. No pneumothorax. HEART AND MEDIASTINUM: No acute abnormality of the cardiac and mediastinal silhouettes. BONES AND SOFT  TISSUES: No acute osseous abnormality. IMPRESSION: 1. No acute cardiopulmonary process. Electronically signed by: Bryan Seip MD 09/11/2024 06:32 PM EST RP Workstation: [id number]    Scheduled Meds:  aspirin   81 mg Oral Daily   clopidogrel   75 mg Oral Daily   enoxaparin  (LOVENOX ) injection  40 mg Subcutaneous Q24H   [START ON 09/13/2024] furosemide   40 mg Intravenous Daily   insulin  aspart  0-15 Units Subcutaneous TID WC   insulin  aspart  0-5 Units Subcutaneous QHS   metoprolol  tartrate  25 mg Oral BID   potassium chloride  SA  20 mEq Oral Daily   rosuvastatin   20 mg Oral q1800   Continuous Infusions:   LOS: 0 days   Time spent: 55 mins  Rayan Dyal Vicci, MD How to contact the [facility] Attending or Consulting provider 7A - 7P or covering provider during after hours 7P -7A, for this patient?  Check the care team in [facility] and look for a) attending/consulting TRH provider listed and b) the TRH team listed Log into [email or web address] to find provider on call.  Locate the TRH provider you are looking for under [facility] and page to a number that  you can be directly reached. If you still have difficulty reaching the provider, please page the [facility] (Director on Call) for the Hospitalists listed on amion for assistance.  09/12/2024, 10:47 AM

## 2024-09-13 DIAGNOSIS — I5031 Acute diastolic (congestive) heart failure: Secondary | ICD-10-CM

## 2024-09-13 LAB — CBC
HCT: 38.7 % — ABNORMAL LOW (ref 39.0–52.0)
Hemoglobin: 13 g/dL (ref 13.0–17.0)
MCH: 28.8 pg (ref 26.0–34.0)
MCHC: 33.6 g/dL (ref 30.0–36.0)
MCV: 85.6 fL (ref 80.0–100.0)
Platelets: 449 K/uL — ABNORMAL HIGH (ref 150–400)
RBC: 4.52 MIL/uL (ref 4.22–5.81)
RDW: 13.3 % (ref 11.5–15.5)
WBC: 11 K/uL — ABNORMAL HIGH (ref 4.0–10.5)
nRBC: 0 % (ref 0.0–0.2)

## 2024-09-13 LAB — BASIC METABOLIC PANEL WITH GFR
Anion gap: 13 (ref 5–15)
BUN: 35 mg/dL — ABNORMAL HIGH (ref 8–23)
CO2: 25 mmol/L (ref 22–32)
Calcium: 9.1 mg/dL (ref 8.9–10.3)
Chloride: 97 mmol/L — ABNORMAL LOW (ref 98–111)
Creatinine, Ser: 1.49 mg/dL — ABNORMAL HIGH (ref 0.61–1.24)
GFR, Estimated: 51 mL/min — ABNORMAL LOW (ref >60)
Glucose, Bld: 171 mg/dL — ABNORMAL HIGH (ref 70–99)
Potassium: 3.7 mmol/L (ref 3.5–5.1)
Sodium: 134 mmol/L — ABNORMAL LOW (ref 135–145)

## 2024-09-13 LAB — GLUCOSE, CAPILLARY
Glucose-Capillary: 163 mg/dL — ABNORMAL HIGH (ref 70–99)
Glucose-Capillary: 203 mg/dL — ABNORMAL HIGH (ref 70–99)

## 2024-09-13 LAB — HEPATIC FUNCTION PANEL
ALT: 218 U/L — ABNORMAL HIGH (ref 0–44)
AST: 87 U/L — ABNORMAL HIGH (ref 15–41)
Albumin: 3.9 g/dL (ref 3.5–5.0)
Alkaline Phosphatase: 96 U/L (ref 38–126)
Bilirubin, Direct: 0.7 mg/dL — ABNORMAL HIGH (ref 0.0–0.2)
Indirect Bilirubin: 0.4 mg/dL (ref 0.3–0.9)
Total Bilirubin: 1.1 mg/dL (ref 0.0–1.2)
Total Protein: 6.7 g/dL (ref 6.5–8.1)

## 2024-09-13 LAB — MAGNESIUM: Magnesium: 2.4 mg/dL (ref 1.7–2.4)

## 2024-09-13 MED ORDER — EMPAGLIFLOZIN 10 MG PO TABS: 10.0000 mg | ORAL_TABLET | Freq: Every day | ORAL | 2 refills | Status: AC

## 2024-09-13 MED ORDER — TORSEMIDE 20 MG PO TABS: 20.0000 mg | ORAL_TABLET | Freq: Every day | ORAL | Status: DC

## 2024-09-13 MED ORDER — TORSEMIDE 20 MG PO TABS: 20.0000 mg | ORAL_TABLET | Freq: Every day | ORAL | 1 refills | Status: DC

## 2024-09-13 MED ORDER — EMPAGLIFLOZIN 10 MG PO TABS
10.0000 mg | ORAL_TABLET | Freq: Every day | ORAL | Status: DC
  Administered 2024-09-13: 10 mg via ORAL
  Filled 2024-09-13: qty 1

## 2024-09-13 NOTE — Progress Notes (Signed)
 Heart Failure Navigator Progress Note  Assessed for Heart & Vascular TOC clinic readiness.  Patient awaiting transfer to [facility].  Navigator will sign off at this time.  Charmaine Pines, RN, BSN [facility] Heart Failure Navigator Secure Chat Only

## 2024-09-13 NOTE — Discharge Summary (Signed)
 Physician Discharge Summary  [name] FMW:[id number] DOB: 11-07-1955 DOA: 09/11/2024  PCP: Severa Rock HERO, FNP Cardiology: Mallipeddi  Admit date: 09/11/2024 Discharge date: 09/13/2024  Admitted From:  Home  Disposition: Home   Recommendations for Outpatient Follow-up:  Follow up with PCP in 2 weeks Follow up with cardiology on 10/01/24 as scheduled  Please follow up CMP for LFTs in 2 weeks  Home Health:  PT ordered   Discharge Condition: STABLE   CODE STATUS: FULL DIET: Heart Healthy, 2 gram sodium    Brief Hospitalization Summary: Please see all hospital notes, images, labs for full details of the hospitalization. Admission provider HPI:  68 y.o. male with medical history significant for GERD, type 2 diabetes mellitus, essential hypertension and OSA on CPAP and CVA/TIA, who presented to the emergency room with acute onset of worsening dyspnea on exertion without cough or wheezing.  He denied any orthopnea or paroxysmal nocturnal dyspnea.  No fever or chills.  He underwent four-vessel CABG on 11/25 at [facility] and was discharged on 11/30.  He has been doing well until the last couple days when his symptoms started.  No dysuria, oliguria or hematuria or flank pain.  No fever or chills.  No headache or dizziness or blurred vision.   ED Course: When the patient came to the ER, vital signs were within normal.  Labs revealed sodium 134 and CO2 19 with blood glucose 174, BUN of 30 and creatinine 1.28 close to baseline with AG 16.  AST was 164 ALT 303.  proBNP is 2578.  CBC showed leukocytosis of 11 with neutrophilia and thrombocytosis of 421.  Respiratory panel came back negative. EKG showed normal sinus rhythm with rate of 87 with borderline right axis deviation. Portable chest x-ray showed no acute cardiopulmonary disease.   Chest CTA revealed no PE.  It showed small left pleural effusion and left lower lobe atelectasis.   The case was discussed with Dr. Sharie foot with cardiothoracic  surgery and recommendation was for medical admission and cardiology evaluation.  Contact was also made with the cardiology fellow Dr. Debarah at [facility] who is aware about the patient being admitted.  The patient was ordered IV Lasix .  He will be admitted to a telemetry observation bed at [facility] for further evaluation and management.  Hospital course by listed problems addressed  Acute CHF (congestive heart failure)  - mild and responded well to IV lasix  and clinically much improved  - Will continue diuresis with IV Lasix  40 mg once daily  - Follow up 2D echo LVEF 09/12/24 EF 55 to 60%, normal LV/RV function, moderate LVH, G1 DD, mildly dilated LA, trivial MV regurgitation, AV sclerosis without AS  - Cardiology consulted.  Reds vest reading was 23 and reassuring.  Pt was started on torsemide  20 mg daily and empagliflozin  10 mg daily.  Pt was ambulated again and did fairly well with some minor weakness.  He is agreeable to home health PT.  DC home today.  He has outpatient follow up with cardiology on 10/01/24.     Type 2 diabetes mellitus with vascular complications  - The patient will be placed on supplemental coverage with NovoLog . - resume home metformin  and empagliflozin  started and prescribed in hospital  CBG (last 3)  Recent Labs    09/12/24 2022 09/13/24 0730 09/13/24 1135  GLUCAP 199* 163* 203*   Dyslipidemia - continue statin therapy.   Coronary artery disease - The patient is status post four-vessel CABG on 08/31/2024. -  We will continue beta-blocker therapy with Lopressor  as well as statin therapy, aspirin  and Plavix .   OSA on CPAP Will resume CPAP nightly.  Discharge Diagnoses:  Principal Problem:   Acute CHF (congestive heart failure) (HCC) Active Problems:   OSA on CPAP   Coronary artery disease   Dyslipidemia   Type 2 diabetes mellitus without complications (HCC)   Shortness of breath   Acute heart failure [facility])   Discharge Instructions:  Allergies as of 09/13/2024    No Known Allergies      Medication List     STOP taking these medications    furosemide  40 MG tablet Commonly known as: LASIX        TAKE these medications    Accu-Chek FastClix Lancets Misc CHECK BS DAILY DX E11.9   Accu-Chek Guide Me w/Device Kit CHECK BS DAILY DX E11.9   acetaminophen  500 MG tablet Commonly known as: TYLENOL  Take 1-2 tablets (500-1,000 mg total) by mouth every 6 (six) hours as needed.   albuterol  108 (90 Base) MCG/ACT inhaler Commonly known as: VENTOLIN  HFA Inhale 2 puffs into the lungs every 6 (six) hours as needed for shortness of breath.   aspirin  81 MG chewable tablet Chew 81 mg by mouth daily.   clopidogrel  75 MG tablet Commonly known as: Plavix  Take 1 tablet (75 mg total) by mouth daily.   Dexcom G7 Sensor Misc 1 Device by Does not apply route every 14 (fourteen) days.   empagliflozin  10 MG Tabs tablet Commonly known as: JARDIANCE  Take 1 tablet (10 mg total) by mouth daily. Start taking on: September 14, 2024   FREESTYLE LITE test strip Generic drug: glucose blood Check BS daily Dx E11.9   metFORMIN  500 MG tablet Commonly known as: GLUCOPHAGE  Take 1 tablet (500 mg total) by mouth 2 (two) times daily with a meal.   methocarbamol  500 MG tablet Commonly known as: ROBAXIN  Take 1 tablet (500 mg total) by mouth every 8 (eight) hours as needed for muscle spasms.   metoprolol  tartrate 25 MG tablet Commonly known as: LOPRESSOR  Take 1 tablet (25 mg total) by mouth 2 (two) times daily.   potassium chloride  SA 20 MEQ tablet Commonly known as: KLOR-CON  M Take 1 tablet (20 mEq total) by mouth daily.   rosuvastatin  20 MG tablet Commonly known as: CRESTOR  Take 1 tablet (20 mg total) by mouth daily at 6 PM.   torsemide  20 MG tablet Commonly known as: DEMADEX  Take 1 tablet (20 mg total) by mouth daily. Start taking on: September 14, 2024   traMADol  50 MG tablet Commonly known as: ULTRAM  Take 1 tablet (50 mg total) by mouth every 4  (four) hours as needed for moderate pain (pain score 4-6).        Follow-up Information     Cardiology. Go on 10/01/2024.   Why: Hospital Follow Up as scheduled        Rakes, Rock HERO, FNP Follow up.   Specialty: Family Medicine Why: Hospital Follow Up Contact information: [address] [place] [address] [phone]                No Known Allergies Allergies as of 09/13/2024   No Known Allergies      Medication List     STOP taking these medications    furosemide  40 MG tablet Commonly known as: LASIX        TAKE these medications    Accu-Chek FastClix Lancets Misc CHECK BS DAILY DX E11.9   Accu-Chek Guide  Me w/Device Kit CHECK BS DAILY DX E11.9   acetaminophen  500 MG tablet Commonly known as: TYLENOL  Take 1-2 tablets (500-1,000 mg total) by mouth every 6 (six) hours as needed.   albuterol  108 (90 Base) MCG/ACT inhaler Commonly known as: VENTOLIN  HFA Inhale 2 puffs into the lungs every 6 (six) hours as needed for shortness of breath.   aspirin  81 MG chewable tablet Chew 81 mg by mouth daily.   clopidogrel  75 MG tablet Commonly known as: Plavix  Take 1 tablet (75 mg total) by mouth daily.   Dexcom G7 Sensor Misc 1 Device by Does not apply route every 14 (fourteen) days.   empagliflozin  10 MG Tabs tablet Commonly known as: JARDIANCE  Take 1 tablet (10 mg total) by mouth daily. Start taking on: September 14, 2024   FREESTYLE LITE test strip Generic drug: glucose blood Check BS daily Dx E11.9   metFORMIN  500 MG tablet Commonly known as: GLUCOPHAGE  Take 1 tablet (500 mg total) by mouth 2 (two) times daily with a meal.   methocarbamol  500 MG tablet Commonly known as: ROBAXIN  Take 1 tablet (500 mg total) by mouth every 8 (eight) hours as needed for muscle spasms.   metoprolol  tartrate 25 MG tablet Commonly known as: LOPRESSOR  Take 1 tablet (25 mg total) by mouth 2 (two) times daily.   potassium chloride  SA 20 MEQ tablet Commonly  known as: KLOR-CON  M Take 1 tablet (20 mEq total) by mouth daily.   rosuvastatin  20 MG tablet Commonly known as: CRESTOR  Take 1 tablet (20 mg total) by mouth daily at 6 PM.   torsemide  20 MG tablet Commonly known as: DEMADEX  Take 1 tablet (20 mg total) by mouth daily. Start taking on: September 14, 2024   traMADol  50 MG tablet Commonly known as: ULTRAM  Take 1 tablet (50 mg total) by mouth every 4 (four) hours as needed for moderate pain (pain score 4-6).        Procedures/Studies: ECHOCARDIOGRAM COMPLETE Result Date: 09/12/2024    ECHOCARDIOGRAM REPORT   Patient Name:   Sartaj Hoskin. Date of Exam: 09/12/2024 Medical Rec #:  [id number]         Height:       67.0 in Accession #:    [id number]        Weight:       197.4 lb Date of Birth:  01-Oct-1956          BSA:          2.011 m Patient Age:    68 years          BP:           118/71 mmHg Patient Gender: M                 HR:           86 bpm. Exam Location:  [facility] Procedure: 2D Echo, Cardiac Doppler, Color Doppler and Intracardiac            Opacification Agent (Both Spectral and Color Flow Doppler were            utilized during procedure). Indications:    CHF I50.31  History:        Patient has prior history of Echocardiogram examinations, most                 recent 08/30/2024. Stroke; Risk Factors:Diabetes.  Sonographer:    Tinnie Gosling RDCS Referring Phys: [id number] JAN A MANSY IMPRESSIONS  1. Left ventricular  ejection fraction, by estimation, is 55 to 60%. The left ventricle has normal function. The left ventricle has no regional wall motion abnormalities. There is moderate left ventricular hypertrophy. Left ventricular diastolic parameters are consistent with Grade I diastolic dysfunction (impaired relaxation).  2. Right ventricular systolic function is normal. The right ventricular size is normal.  3. Left atrial size was mildly dilated.  4. The mitral valve is abnormal. Trivial mitral valve regurgitation. No evidence of mitral  stenosis.  5. The aortic valve is tricuspid. There is moderate calcification of the aortic valve. There is moderate thickening of the aortic valve. Aortic valve regurgitation is not visualized. Aortic valve sclerosis is present, with no evidence of aortic valve stenosis.  6. The inferior vena cava is normal in size with greater than 50% respiratory variability, suggesting right atrial pressure of 3 mmHg. FINDINGS  Left Ventricle: Left ventricular ejection fraction, by estimation, is 55 to 60%. The left ventricle has normal function. The left ventricle has no regional wall motion abnormalities. Strain was performed and the global longitudinal strain is indeterminate. The left ventricular internal cavity size was normal in size. There is moderate left ventricular hypertrophy. Left ventricular diastolic parameters are consistent with Grade I diastolic dysfunction (impaired relaxation). Right Ventricle: The right ventricular size is normal. No increase in right ventricular wall thickness. Right ventricular systolic function is normal. Left Atrium: Left atrial size was mildly dilated. Right Atrium: Right atrial size was normal in size. Pericardium: There is no evidence of pericardial effusion. Mitral Valve: The mitral valve is abnormal. There is mild thickening of the mitral valve leaflet(s). There is mild calcification of the mitral valve leaflet(s). Mild mitral annular calcification. Trivial mitral valve regurgitation. No evidence of mitral valve stenosis. Tricuspid Valve: The tricuspid valve is normal in structure. Tricuspid valve regurgitation is not demonstrated. No evidence of tricuspid stenosis. Aortic Valve: The aortic valve is tricuspid. There is moderate calcification of the aortic valve. There is moderate thickening of the aortic valve. Aortic valve regurgitation is not visualized. Aortic valve sclerosis is present, with no evidence of aortic valve stenosis. Pulmonic Valve: The pulmonic valve was normal in  structure. Pulmonic valve regurgitation is trivial. No evidence of pulmonic stenosis. Aorta: The aortic root is normal in size and structure. Venous: The inferior vena cava is normal in size with greater than 50% respiratory variability, suggesting right atrial pressure of 3 mmHg. IAS/Shunts: No atrial level shunt detected by color flow Doppler. Additional Comments: 3D was performed not requiring image post processing on an independent workstation and was indeterminate.  LEFT VENTRICLE PLAX 2D LVIDd:         2.30 cm     Diastology LVIDs:         1.90 cm     LV e' medial:    5.22 cm/s LV PW:         1.40 cm     LV E/e' medial:  12.5 LV IVS:        1.40 cm     LV e' lateral:   11.40 cm/s LVOT diam:     2.20 cm     LV E/e' lateral: 5.7 LV SV:         45 LV SV Index:   22 LVOT Area:     3.80 cm LV IVRT:       114 msec  LV Volumes (MOD) LV vol d, MOD A2C: 29.7 ml LV vol d, MOD A4C: 35.7 ml LV vol s, MOD A2C: 11.3  ml LV vol s, MOD A4C: 11.8 ml LV SV MOD A2C:     18.4 ml LV SV MOD A4C:     35.7 ml LV SV MOD BP:      21.9 ml RIGHT VENTRICLE            IVC RV S prime:     5.74 cm/s  IVC diam: 0.80 cm LEFT ATRIUM             Index        RIGHT ATRIUM           Index LA diam:        3.70 cm 1.84 cm/m   RA Area:     11.10 cm LA Vol (A2C):   20.2 ml 10.04 ml/m  RA Volume:   20.10 ml  9.99 ml/m LA Vol (A4C):   23.7 ml 11.78 ml/m LA Biplane Vol: 21.7 ml 10.79 ml/m  AORTIC VALVE LVOT Vmax:   76.50 cm/s LVOT Vmean:  51.500 cm/s LVOT VTI:    0.118 m  AORTA Ao Root diam: 3.10 cm Ao Asc diam:  3.60 cm MITRAL VALVE MV Area (PHT): 3.45 cm    SHUNTS MV Decel Time: 220 msec    Systemic VTI:  0.12 m MV E velocity: 65.50 cm/s  Systemic Diam: 2.20 cm MV A velocity: 89.20 cm/s MV E/A ratio:  0.73 Maude Emmer MD Electronically signed by Maude Emmer MD Signature Date/Time: 09/12/2024/12:20:30 PM    Final    CT Angio Chest PE W and/or Wo Contrast Result Date: 09/11/2024 EXAM: CTA of the Chest with contrast for PE 09/11/2024 07:50:59  PM TECHNIQUE: CTA of the chest was performed without and with the administration of 75 mL of iohexol  (OMNIPAQUE ) 350 MG/ML injection. Multiplanar reformatted images are provided for review. MIP images are provided for review. Automated exposure control, iterative reconstruction, and/or weight based adjustment of the mA/kV was utilized to reduce the radiation dose to as low as reasonably achievable. COMPARISON: 08/29/2024 CLINICAL HISTORY: Pulmonary embolism (PE) suspected, high prob. FINDINGS: PULMONARY ARTERIES: Pulmonary arteries are adequately opacified for evaluation. No pulmonary embolism. Main pulmonary artery is normal in caliber. MEDIASTINUM: The heart demonstrates prior CABG. Aortic atherosclerosis. The pericardium demonstrates no acute abnormality. LYMPH NODES: No mediastinal, hilar or axillary lymphadenopathy. LUNGS AND PLEURA: Small left pleural effusion with left lower lobe atelectasis. No pneumothorax. UPPER ABDOMEN: Limited images of the upper abdomen are unremarkable. SOFT TISSUES AND BONES: No acute bone or soft tissue abnormality. IMPRESSION: 1. No pulmonary embolism. 2. Small left pleural effusion with left lower lobe atelectasis. Electronically signed by: Franky Crease MD 09/11/2024 08:00 PM EST RP Workstation: [id number]   DG Chest Portable 1 View Result Date: 09/11/2024 EXAM: 1 VIEW(S) XRAY OF THE CHEST 09/11/2024 06:24:00 PM COMPARISON: 6 days ago status post coronary artery bypass graft stable cardiomediastinal silhouette. CLINICAL HISTORY: dyspnea FINDINGS: LUNGS AND PLEURA: No focal pulmonary opacity. No pleural effusion. No pneumothorax. HEART AND MEDIASTINUM: No acute abnormality of the cardiac and mediastinal silhouettes. BONES AND SOFT TISSUES: No acute osseous abnormality. IMPRESSION: 1. No acute cardiopulmonary process. Electronically signed by: Lynwood Seip MD 09/11/2024 06:32 PM EST RP Workstation: [id number]   DG Chest 2 View Result Date: 09/05/2024 CLINICAL DATA:  Status post  CABG and chest tube removal EXAM: CHEST - 2 VIEW COMPARISON:  Chest radiograph dated 09/04/2024 FINDINGS: Lines/tubes: Interval removal of left basilar pleural catheter. Lungs: Low lung volumes with bronchovascular crowding. Hazy and linear left basilar opacity. Pleura: No definite  pneumothorax. Trace blunting of the left costophrenic angle. Heart/mediastinum: Similar enlarged, postsurgical cardiomediastinal silhouette. Bones: Median sternotomy wires are nondisplaced. IMPRESSION: 1. Interval removal of left basilar pleural catheter. No definite pneumothorax. 2. Trace blunting of the left costophrenic angle, which may represent a small pleural effusion. 3. Hazy and linear left basilar opacity, likely atelectasis. Electronically Signed   By: Limin  Xu M.D.   On: 09/05/2024 11:36   DG Chest 2 View Result Date: 09/04/2024 CLINICAL DATA:  Status post CABG EXAM: CHEST - 2 VIEW COMPARISON:  Chest radiograph dated 09/03/2024 FINDINGS: Lines/tubes: Interval removal of right IJ sheath and mediastinal catheters. Left basilar pleural catheter remains. Lungs: Low lung volumes with bronchovascular crowding. Bibasilar hazy opacities. Pleura: Trace blunting of left costophrenic angle.  No pneumothorax. Heart/mediastinum: Similar mildly enlarged postsurgical cardiomediastinal silhouette. Bones: Median sternotomy wires are nondisplaced. IMPRESSION: 1. Interval removal of right IJ sheath and mediastinal catheters. 2. Low lung volumes with bibasilar hazy opacities, likely atelectasis. 3. Trace left pleural effusion. Electronically Signed   By: Limin  Xu M.D.   On: 09/04/2024 13:13   DG Chest Port 1 View Result Date: 09/03/2024 EXAM: 1 VIEW XRAY OF THE CHEST 09/03/2024 05:37:00 AM COMPARISON: Portable chest yesterday at 5:25 am. CLINICAL HISTORY: Status post cardiac surgery FINDINGS: LINES, TUBES AND DEVICES: Pericardial and mediastinal drains remain in place. There is a right IJ catheter through an introducer sheath with the  tip in the upper SVC. LUNGS AND PLEURA: No measurable pneumothorax. Linear and patchy left basilar opacity appears similar and could all be atelectasis or could be a combination of atelectasis and consolidation. No new or worsening opacity is seen. The rest of the lungs are generally clear apart from minimal pleural effusions. HEART AND MEDIASTINUM: Stable cardiomegaly. Stable mediastinal widening with aortic atherosclerosis. BONES AND SOFT TISSUES: No acute osseous abnormality. Sternotomy and CABG changes are again noted. IMPRESSION: 1. Stable cardiomegaly and mediastinal widening with aortic atherosclerosis. 2. Unchanged left basilar opacity, favored atelectasis versus atelectasis with superimposed consolidation. 3. Minimal pleural effusions. Electronically signed by: Francis Quam MD 09/03/2024 06:58 AM EST RP Workstation: [id number]   ECHO INTRAOPERATIVE TEE Result Date: 09/02/2024  *INTRAOPERATIVE TRANSESOPHAGEAL REPORT *  Patient Name:   Seraj Dunnam. Date of Exam: 09/01/2024 Medical Rec #:  [id number]         Height:       67.0 in Accession #:    [id number]        Weight:       199.3 lb Date of Birth:  [birth year]          BSA:          2.02 m Patient Age:    68 years          BP:           111/59 mmHg Patient Gender: M                 HR:           76 bpm. Exam Location:  Anesthesiology Transesophogeal exam was perform intraoperatively during surgical procedure. Patient was closely monitored under general anesthesia during the entirety of examination. Indications:     CAD Performing Phys: [id number] BAILEY S SU Complications: No known complications during this procedure. POST-OP IMPRESSIONS _ Left Ventricle: The left ventricle is unchanged from pre-bypass. The wall motion is Improved RWMA seen in preop exam. _ Right Ventricle: mildly reduced function. The cavity was normal. _ Left Atrial Appendage: The left atrial  appendage appears unchanged from pre-bypass. _ Aortic Valve: The aortic valve appears  unchanged from pre-bypass. _ Mitral Valve: The mitral valve appears unchanged from pre-bypass. _ Tricuspid Valve: The tricuspid valve appears unchanged from pre-bypass. _ Comments: Limited images post-op. PRE-OP FINDINGS  Left Ventricle: The left ventricle has normal systolic function, with an ejection fraction of 55-60%. The cavity size was normal. There is mild concentric left ventricular hypertrophy. Right Ventricle: The right ventricle has normal systolic function. The cavity was normal. There is no increase in right ventricular wall thickness. Left Atrium: Left atrial size was normal in size. No left atrial/left atrial appendage thrombus was detected. Right Atrium: Right atrial size was normal in size. Interatrial Septum: No atrial level shunt detected by color flow Doppler. Pericardium: There is no evidence of pericardial effusion. Mitral Valve: The mitral valve is normal in structure. Mitral valve regurgitation is moderate by color flow Doppler. The MR jet is centrally-directed. There is no evidence of mitral valve vegetation. Tricuspid Valve: The tricuspid valve was normal in structure. Tricuspid valve regurgitation is trivial by color flow Doppler. Aortic Valve: The aortic valve is tricuspid Aortic valve regurgitation was not visualized by color flow Doppler. There is no stenosis of the aortic valve. Pulmonic Valve: The pulmonic valve was normal in structure. Pulmonic valve regurgitation is trivial by color flow Doppler.  Norleen Pope MD Electronically signed by Norleen Pope MD Signature Date/Time: 09/02/2024/9:28:33 AM    Final    DG Chest Port 1 View Result Date: 09/02/2024 EXAM: 1 VIEW XRAY OF THE CHEST 09/02/2024 05:30:00 AM COMPARISON: 09/01/2024 CLINICAL HISTORY: 241121 S/P CABG x 4 241121 FINDINGS: LINES, TUBES AND DEVICES: Endotracheal tube and nasogastric tube removed. Stable right IJ central venous catheter with tip projecting over the superior vena cava. Stable mediastinal drain and left  chest tube. LUNGS AND PLEURA: Low lung volumes. Persistent left retrocardiac airspace opacity. Small left pleural effusion. probable HEART AND MEDIASTINUM: CABG markers noted. BONES AND SOFT TISSUES: Sternotomy wires noted. IMPRESSION: 1. Persistent left retrocardiac airspace opacity and probable small left pleural effusion. Electronically signed by: Norleen Kil MD 09/02/2024 06:10 AM EST RP Workstation: [id number]   DG Chest Port 1 View Result Date: 09/01/2024 EXAM: 1 VIEW(S) XRAY OF THE CHEST 09/01/2024 04:00:00 PM COMPARISON: Comparison with 08/29/2024. CLINICAL HISTORY: 241121 S/P CABG x 4 241121 FINDINGS: LINES, TUBES AND DEVICES: An endotracheal tube has been placed with the tip measuring 1.9 cm above the carina. An enteric tube was placed. The tip is off the field of view but below the left hemidiaphragm. Mediastinal drains and left chest tubes are present. Right central venous catheter with tip over the mid SVC region. LUNGS AND PLEURA: Atelectasis in the left lung base with probable small left pleural effusion. No pneumothorax. HEART AND MEDIASTINUM: Interval postoperative changes in the mediastinum with sternotomy wires and vascular markers present. BONES AND SOFT TISSUES: Sternotomy wires are present. IMPRESSION: 1. Appropriate positioning of support devices including endotracheal tube 1.9 cm above the carina, enteric tube coursing below the left hemidiaphragm, right central venous catheter tip over the mid SVC, mediastinal drains, and left chest tubes. No pneumothorax. 2. Atelectasis in the left lung base with probable small left pleural effusion. 3. Interval postoperative changes in the mediastinum with sternotomy wires and vascular markers present. Electronically signed by: Elsie Gravely MD 09/01/2024 05:14 PM EST RP Workstation: [id number]   VAS US  LOWER EXTREMITY SAPHENOUS VEIN MAPPING Result Date: 08/31/2024 LOWER EXTREMITY VEIN MAPPING Patient Name:  Yancy Knoble.  Date of Exam:    08/31/2024 Medical Rec #: [id number]          Accession #:    [id number] Date of Birth: 09-01-1956           Patient Gender: M Patient Age:   89 years Exam [facility] Procedure:      VAS US  LOWER EXTREMITY SAPHENOUS VEIN MAPPING Referring Phys: CON SU --------------------------------------------------------------------------------  Indications:  CAD Risk Factors: Hypertension, hyperlipidemia, Diabetes.  Comparison Study: No prior studies. Performing Technologist: Cordella Collet RVT  Examination Guidelines: A complete evaluation includes B-mode imaging, spectral Doppler, color Doppler, and power Doppler as needed of all accessible portions of each vessel. Bilateral testing is considered an integral part of a complete examination. Limited examinations for reoccurring indications may be performed as noted. +---------------+-----------+----------------------+---------------+-----------+   RT Diameter  RT Findings         GSV            LT Diameter  LT Findings      (cm)                                            (cm)                  +---------------+-----------+----------------------+---------------+-----------+      0.89                     Saphenofemoral         0.80                                                   Junction                                  +---------------+-----------+----------------------+---------------+-----------+      0.67       branching     Proximal thigh         0.58       branching  +---------------+-----------+----------------------+---------------+-----------+      0.62       branching       Mid thigh            0.49       branching  +---------------+-----------+----------------------+---------------+-----------+      0.62                      Distal thigh          0.63       branching  +---------------+-----------+----------------------+---------------+-----------+      0.53                          Knee               0.59       branching  +---------------+-----------+----------------------+---------------+-----------+      0.40       branching       Prox calf            0.46                  +---------------+-----------+----------------------+---------------+-----------+  0.20       branching        Mid calf            0.38                  +---------------+-----------+----------------------+---------------+-----------+      0.24       branching      Distal calf           0.37                  +---------------+-----------+----------------------+---------------+-----------+ Diagnosing physician: Penne Colorado MD Electronically signed by Penne Colorado MD on 08/31/2024 at 4:02:28 PM.    Final    VAS US  DOPPLER PRE CABG Result Date: 08/31/2024 PREOPERATIVE VASCULAR EVALUATION Patient Name:  JAMARIUS SAHA.  Date of Exam:   08/31/2024 Medical Rec #: [id number]          Accession #:    [id number] Date of Birth: [birth year]           Patient Gender: M Patient Age:   67 years Exam Location:  [facility] Procedure:      VAS US  DOPPLER PRE CABG Referring Phys: BAILEY SU --------------------------------------------------------------------------------  Indications:      Pre-CABG. Risk Factors:     Hypertension, hyperlipidemia, Diabetes. Comparison Study: No prior studies. Performing Technologist: Gerome Ny RVT  Examination Guidelines: A complete evaluation includes B-mode imaging, spectral Doppler, color Doppler, and power Doppler as needed of all accessible portions of each vessel. Bilateral testing is considered an integral part of a complete examination. Limited examinations for reoccurring indications may be performed as noted.  Right Carotid Findings: +----------+--------+--------+--------+-----------------------+--------+           PSV cm/sEDV cm/sStenosisDescribe               Comments +----------+--------+--------+--------+-----------------------+--------+ CCA Prox  58      11               smooth and heterogenous         +----------+--------+--------+--------+-----------------------+--------+ CCA Distal58      12              smooth and heterogenous         +----------+--------+--------+--------+-----------------------+--------+ ICA Prox  91      17              smooth and heterogenous         +----------+--------+--------+--------+-----------------------+--------+ ICA Mid   58      18                                              +----------+--------+--------+--------+-----------------------+--------+ ICA Distal40      15                                     tortuous +----------+--------+--------+--------+-----------------------+--------+ ECA       106     1                                               +----------+--------+--------+--------+-----------------------+--------+ +----------+--------+-------+--------+------------+           PSV cm/sEDV cmsDescribeArm Pressure +----------+--------+-------+--------+------------+ Subclavian137                                 +----------+--------+-------+--------+------------+ +---------+--------+--+--------+-+---------+  VertebralPSV cm/s31EDV cm/s7Antegrade +---------+--------+--+--------+-+---------+ Left Carotid Findings: +----------+--------+--------+--------+-----------------------+--------+           PSV cm/sEDV cm/sStenosisDescribe               Comments +----------+--------+--------+--------+-----------------------+--------+ CCA Prox  83      13              smooth and heterogenous         +----------+--------+--------+--------+-----------------------+--------+ CCA Distal90      16              smooth and heterogenous         +----------+--------+--------+--------+-----------------------+--------+ ICA Prox  83      21              smooth and heterogenous         +----------+--------+--------+--------+-----------------------+--------+ ICA Mid   88      26                                      tortuous +----------+--------+--------+--------+-----------------------+--------+ ICA Distal39      13                                     tortuous +----------+--------+--------+--------+-----------------------+--------+ ECA       144     10                                              +----------+--------+--------+--------+-----------------------+--------+ +----------+--------+--------+--------+------------+ SubclavianPSV cm/sEDV cm/sDescribeArm Pressure +----------+--------+--------+--------+------------+           164                                  +----------+--------+--------+--------+------------+ +---------+--------+--+--------+-+---------+ VertebralPSV cm/s31EDV cm/s6Antegrade +---------+--------+--+--------+-+---------+  ABI Findings: +------------------+-----+---------+ Rt Pressure (mmHg)IndexWaveform  +------------------+-----+---------+ 107                    triphasic +------------------+-----+---------+ 154               1.34 triphasic +------------------+-----+---------+ 159               1.38 triphasic +------------------+-----+---------+ +------------------+-----+---------+ Lt Pressure (mmHg)IndexWaveform  +------------------+-----+---------+ 115                    triphasic +------------------+-----+---------+ 165               1.43 triphasic +------------------+-----+---------+ 144               1.25 triphasic +------------------+-----+---------+ +-------+---------------+ ABI/TBIToday's ABI/TBI +-------+---------------+ Right  1.38            +-------+---------------+ Left   1.43            +-------+---------------+  Right Doppler Findings: +--------+--------+---------+ Site    PressureDoppler   +--------+--------+---------+ [id number]     triphasic +--------+--------+---------+ Radial          triphasic +--------+--------+---------+ Ulnar           triphasic  +--------+--------+---------+  Left Doppler Findings: +--------+--------+---------+ Site    PressureDoppler   +--------+--------+---------+ [id number]     triphasic +--------+--------+---------+ Radial          triphasic +--------+--------+---------+ Ulnar  triphasic +--------+--------+---------+   Summary: Right Carotid: Velocities in the right ICA are consistent with a 1-39% stenosis. Left Carotid: Velocities in the left ICA are consistent with a 1-39% stenosis. Vertebrals: Bilateral vertebral arteries demonstrate antegrade flow. Right ABI: Resting right ankle-brachial index indicates noncompressible right lower extremity arteries. Left ABI: Resting left ankle-brachial index indicates noncompressible left lower extremity arteries. Right Upper Extremity: Doppler waveforms decrease >50% with right radial compression. Doppler waveform obliterate with right ulnar compression. Left Upper Extremity: Doppler waveforms remain within normal limits with left radial compression. Doppler waveforms remain within normal limits with left ulnar compression.  Electronically signed by Penne Colorado MD on 08/31/2024 at 3:58:13 PM.    Final    CARDIAC CATHETERIZATION Result Date: 08/30/2024   Prox RCA lesion is 40% stenosed.   Dist RCA lesion is 100% stenosed.   Ramus lesion is 95% stenosed with 90% stenosed side branch in Lat Ramus.   1st Mrg lesion is 95% stenosed.   RPAV lesion is 100% stenosed.   RPDA lesion is 100% stenosed.   Prox LAD lesion is 90% stenosed.   Mid LAD lesion is 80% stenosed.   LV end diastolic pressure is mildly elevated.   There is no aortic valve stenosis.   Anticipated discharge date to be determined.   Continue aspirin  81 mg daily.  Defer P2Y12 inhibitor pending cardiac surgery consultation.  Resume heparin  infusion 2 hours after TR band removal. Conclusions: Severe three-vessel coronary artery disease, as detailed below, including sequential 90% and 80% proximal/mid LAD  stenoses, 90-95% stenoses of bifurcating ramus intermedius, 95% stenosis of OM1, and chronic total occlusion of distal RCA. Mildly elevated left ventricular filling pressure (LVEDP 20 mmHg). Recommendations: Inpatient cardiac surgery consultation for CABG. Resume heparin  infusion 2 hours after TR band has been removed. Aggressive secondary prevention of coronary artery disease. Transition from nitroglycerin  paste to isosorbide  mononitrate for antianginal therapy. Lonni Hanson, MD [facility]  ECHOCARDIOGRAM COMPLETE Result Date: 08/30/2024    ECHOCARDIOGRAM REPORT   Patient Name:   Raffi Milstein. Date of Exam: 08/30/2024 Medical Rec #:  [id number]         Height:       67.0 in Accession #:    [id number]        Weight:       200.0 lb Date of Birth:  12-20-1955          BSA:          2.022 m Patient Age:    68 years          BP:           175/85 mmHg Patient Gender: M                 HR:           71 bpm. Exam Location:  [facility] Procedure: 2D Echo, Color Doppler and Cardiac Doppler (Both Spectral and Color            Flow Doppler were utilized during procedure). Indications:    NSTEMI I21.4  History:        Patient has no prior history of Echocardiogram examinations.                 Risk Factors:Hypertension and Diabetes.  Sonographer:    Tinnie Gosling RDCS Referring Phys: [id number] CAROLE N HALL IMPRESSIONS  1. Left ventricular ejection fraction, by estimation, is 60 to 65%. The left ventricle has normal function. Left ventricular endocardial border  not optimally defined to evaluate regional wall motion. There is mild left ventricular hypertrophy. Left ventricular diastolic parameters are consistent with Grade I diastolic dysfunction (impaired relaxation).  2. Right ventricular systolic function is normal. The right ventricular size is normal. Tricuspid regurgitation signal is inadequate for assessing PA pressure.  3. The mitral valve is normal in structure. Trivial mitral valve regurgitation. No evidence  of mitral stenosis.  4. The aortic valve was not well visualized. Aortic valve regurgitation is not visualized. No aortic stenosis is present.  5. The inferior vena cava is normal in size with greater than 50% respiratory variability, suggesting right atrial pressure of 3 mmHg. Comparison(s): No prior Echocardiogram. FINDINGS  Left Ventricle: Left ventricular ejection fraction, by estimation, is 60 to 65%. The left ventricle has normal function. Left ventricular endocardial border not optimally defined to evaluate regional wall motion. Strain was performed and the global longitudinal strain is indeterminate. The left ventricular internal cavity size was normal in size. There is mild left ventricular hypertrophy. Left ventricular diastolic parameters are consistent with Grade I diastolic dysfunction (impaired relaxation).  Normal left ventricular filling pressure. Right Ventricle: The right ventricular size is normal. No increase in right ventricular wall thickness. Right ventricular systolic function is normal. Tricuspid regurgitation signal is inadequate for assessing PA pressure. Left Atrium: Left atrial size was normal in size. Right Atrium: Right atrial size was normal in size. Pericardium: There is no evidence of pericardial effusion. Mitral Valve: The mitral valve is normal in structure. Trivial mitral valve regurgitation. No evidence of mitral valve stenosis. Tricuspid Valve: The tricuspid valve is normal in structure. Tricuspid valve regurgitation is not demonstrated. No evidence of tricuspid stenosis. Aortic Valve: The aortic valve was not well visualized. Aortic valve regurgitation is not visualized. No aortic stenosis is present. Pulmonic Valve: The pulmonic valve was not well visualized. Pulmonic valve regurgitation is not visualized. No evidence of pulmonic stenosis. Aorta: The aortic root and ascending aorta are structurally normal, with no evidence of dilitation. Venous: The inferior vena cava is  normal in size with greater than 50% respiratory variability, suggesting right atrial pressure of 3 mmHg. IAS/Shunts: No atrial level shunt detected by color flow Doppler. Additional Comments: 3D was performed not requiring image post processing on an independent workstation and was indeterminate.  LEFT VENTRICLE PLAX 2D LVIDd:         4.20 cm   Diastology LVIDs:         3.00 cm   LV e' medial:    5.98 cm/s LV PW:         1.20 cm   LV E/e' medial:  14.3 LV IVS:        1.20 cm   LV e' lateral:   10.30 cm/s LVOT diam:     2.20 cm   LV E/e' lateral: 8.3 LV SV:         69 LV SV Index:   34 LVOT Area:     3.80 cm LV IVRT:       90 msec  RIGHT VENTRICLE             IVC RV S prime:     12.00 cm/s  IVC diam: 1.50 cm TAPSE (M-mode): 2.3 cm LEFT ATRIUM             Index        RIGHT ATRIUM           Index LA diam:        4.00  cm 1.98 cm/m   RA Area:     11.90 cm LA Vol (A2C):   27.4 ml 13.55 ml/m  RA Volume:   23.80 ml  11.77 ml/m LA Vol (A4C):   34.2 ml 16.91 ml/m LA Biplane Vol: 33.1 ml 16.37 ml/m  AORTIC VALVE LVOT Vmax:   91.10 cm/s LVOT Vmean:  65.900 cm/s LVOT VTI:    0.181 m  AORTA Ao Root diam: 2.60 cm Ao Asc diam:  3.40 cm MITRAL VALVE MV Area (PHT): 2.53 cm     SHUNTS MV Decel Time: 300 msec     Systemic VTI:  0.18 m MV E velocity: 85.50 cm/s   Systemic Diam: 2.20 cm MV A velocity: 113.00 cm/s MV E/A ratio:  0.76 Vishnu Priya Mallipeddi Electronically signed by Diannah Late Mallipeddi Signature Date/Time: 08/30/2024/12:27:12 PM    Final    CT Angio Chest PE W and/or Wo Contrast Result Date: 08/29/2024 EXAM: CTA of the Chest with contrast for PE 08/29/2024 07:36:01 PM TECHNIQUE: CTA of the chest was performed after the administration of intravenous contrast. 75 ml omnipaque  350 Multiplanar reformatted images are provided for review. MIP images are provided for review. Automated exposure control, iterative reconstruction, and/or weight based adjustment of the mA/kV was utilized to reduce the radiation dose  to as low as reasonably achievable. COMPARISON: Chest x-ray from 08/29/2024. CLINICAL HISTORY: Chest pain. FINDINGS: PULMONARY ARTERIES: No intraluminal filling defect to suggest pulmonary embolism is noted. Main pulmonary artery is normal in caliber. The pulmonary artery shows a normal branching pattern bilaterally. MEDIASTINUM: The heart is not significantly enlarged. Scattered coronary calcifications are noted. The thoracic aorta shows atherosclerotic calcifications. Mild dilatation of the ascending aorta to 4.2 cm is noted. The thoracic inlet is within normal limits. The esophagus, as visualized is within normal limits. LYMPH NODES: No mediastinal, hilar or axillary lymphadenopathy. LUNGS AND PLEURA: The lungs are well aerated bilaterally. No focal infiltrate or effusion is seen. No pneumothorax. UPPER ABDOMEN: The upper abdomen is unremarkable. Prior cholecystectomy is seen. SOFT TISSUES AND BONES: Degenerative changes of the thoracic spine are seen. No rib abnormality is noted. No acute soft tissue abnormality. IMPRESSION: 1. No evidence of pulmonary embolism. 2. 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 This recommendation follows 2010 ACCF/AHA/AATS/ACR/ASA/SCA/SCAI/SIR/STS/SVM Guidelines for the Diagnosis and Management of Patients with Thoracic Aortic Disease. Circulation. 2010; 121: Z733-z630. Aortic aneurysm NOS (ICD10-I71.9) Electronically signed by: Oneil Devonshire MD 08/29/2024 07:45 PM EST RP Workstation: [id number]   DG Chest Port 1 View Result Date: 08/29/2024 EXAM: 1 VIEW(S) XRAY OF THE CHEST 08/29/2024 06:02:00 PM COMPARISON: 11/13/2014 CLINICAL HISTORY: CP FINDINGS: LUNGS AND PLEURA: No focal pulmonary opacity. No pleural effusion. No pneumothorax. HEART AND MEDIASTINUM: No acute abnormality of the cardiac and mediastinal silhouettes. BONES AND SOFT TISSUES: No acute osseous abnormality.  IMPRESSION: 1. No acute cardiopulmonary process. Electronically signed by: Franky Crease MD 08/29/2024 06:07 PM EST RP Workstation: [id number]     Subjective: Pt was a little weak with ambulating but overall he is feeling a lot better after diuresing in the hospital.   Discharge Exam: Vitals:   09/13/24 0446 09/13/24 0956  BP: 114/76 116/74  Pulse: 99 94  Resp: 18   Temp: 97.8 F (36.6 C) 98.7 F (37.1 C)  SpO2: 98% 93%   Vitals:   09/13/24 0130 09/13/24 0446 09/13/24 0956 09/13/24 1117  BP: 115/77 114/76 116/74   Pulse: 98 99 94  Resp: 18 18    Temp: 98.4 F (36.9 C) 97.8 F (36.6 C) 98.7 F (37.1 C)   TempSrc: Oral Oral Oral   SpO2: 96% 98% 93%   Weight:  85.1 kg  81.4 kg   General: Pt is alert, awake, not in acute distress Cardiovascular: normal S1/S2 +, no rubs, no gallops Respiratory: CTA bilaterally, no wheezing, no rhonchi Abdominal: Soft, NT, ND, bowel sounds + Extremities: no edema, no cyanosis   The results of significant diagnostics from this hospitalization (including imaging, microbiology, ancillary and laboratory) are listed below for reference.     Microbiology: Recent Results (from the past 240 hours)  Resp panel by RT-PCR (RSV, Flu A&B, Covid) Anterior Nasal Swab     Status: None   Collection Time: 09/11/24  6:30 PM   Specimen: Anterior Nasal Swab  Result Value Ref Range Status   SARS Coronavirus 2 by RT PCR NEGATIVE NEGATIVE Final    Comment: (NOTE) SARS-CoV-2 target nucleic acids are NOT DETECTED.  The SARS-CoV-2 RNA is generally detectable in upper respiratory specimens during the acute phase of infection. The lowest concentration of SARS-CoV-2 viral copies this assay can detect is 138 copies/mL. A negative result does not preclude SARS-Cov-2 infection and should not be used as the sole basis for treatment or other patient management decisions. A negative result may occur with  improper specimen collection/handling, submission of specimen  other than nasopharyngeal swab, presence of viral mutation(s) within the areas targeted by this assay, and inadequate number of viral copies(<138 copies/mL). A negative result must be combined with clinical observations, patient history, and epidemiological information. The expected result is Negative.  Fact Sheet for Patients:  bloggercourse.com  Fact Sheet for Healthcare Providers:  seriousbroker.it  This test is no t yet approved or cleared by the [place]  FDA and  has been authorized for detection and/or diagnosis of SARS-CoV-2 by FDA under an Emergency Use Authorization (EUA). This EUA will remain  in effect (meaning this test can be used) for the duration of the COVID-19 declaration under Section 564(b)(1) of the Act, 21 U.S.C.section 360bbb-3(b)(1), unless the authorization is terminated  or revoked sooner.       Influenza A by PCR NEGATIVE NEGATIVE Final   Influenza B by PCR NEGATIVE NEGATIVE Final    Comment: (NOTE) The Xpert Xpress SARS-CoV-2/FLU/RSV plus assay is intended as an aid in the diagnosis of influenza from Nasopharyngeal swab specimens and should not be used as a sole basis for treatment. Nasal washings and aspirates are unacceptable for Xpert Xpress SARS-CoV-2/FLU/RSV testing.  Fact Sheet for Patients: bloggercourse.com  Fact Sheet for Healthcare Providers: seriousbroker.it  This test is not yet approved or cleared by the [place]  FDA and has been authorized for detection and/or diagnosis of SARS-CoV-2 by FDA under an Emergency Use Authorization (EUA). This EUA will remain in effect (meaning this test can be used) for the duration of the COVID-19 declaration under Section 564(b)(1) of the Act, 21 U.S.C. section 360bbb-3(b)(1), unless the authorization is terminated or revoked.     Resp Syncytial Virus by PCR NEGATIVE NEGATIVE Final     Comment: (NOTE) Fact Sheet for Patients: bloggercourse.com  Fact Sheet for Healthcare Providers: seriousbroker.it  This test is not yet approved or cleared by the [place]  FDA and has been authorized for detection and/or diagnosis of SARS-CoV-2 by FDA under an Emergency Use Authorization (EUA). This EUA will remain in effect (meaning this test can be used) for the duration of the COVID-19 declaration  under Section 564(b)(1) of the Act, 21 U.S.C. section 360bbb-3(b)(1), unless the authorization is terminated or revoked.  Performed at [facility], [address], [place], [place] [address]      Labs: BNP (last 3 results) No results for input(s): BNP in the last 8760 hours. Basic Metabolic Panel: Recent Labs  Lab 09/11/24 1850 09/12/24 0416 09/13/24 0506  NA 134* 133* 134*  K 4.5 4.0 3.7  CL 99 97* 97*  CO2 19* 26 25  GLUCOSE 174* 217* 171*  BUN 30* 29* 35*  CREATININE 1.28* 1.34* 1.49*  CALCIUM  9.3 9.1 9.1  MG  --   --  2.4   Liver Function Tests: Recent Labs  Lab 09/11/24 1850 09/12/24 0416 09/13/24 0506  AST 164* 126* 87*  ALT 303* 273* 218*  ALKPHOS 95 96 96  BILITOT 0.6 0.8 1.1  PROT 7.1 6.9 6.7  ALBUMIN  3.9 3.9 3.9   No results for input(s): LIPASE, AMYLASE in the last 168 hours. No results for input(s): AMMONIA in the last 168 hours. CBC: Recent Labs  Lab 09/11/24 1850 09/12/24 0416 09/13/24 0506  WBC 11.0* 12.5* 11.0*  NEUTROABS 8.0*  --   --   HGB 13.4 13.6 13.0  HCT 39.7 39.8 38.7*  MCV 85.2 84.7 85.6  PLT 421* 458* 449*   Cardiac Enzymes: No results for input(s): CKTOTAL, CKMB, CKMBINDEX, TROPONINI in the last 168 hours. BNP: Invalid input(s): POCBNP CBG: Recent Labs  Lab 09/12/24 1258 09/12/24 1619 09/12/24 2022 09/13/24 0730 09/13/24 1135  GLUCAP 208* 159* 199* 163* 203*   D-Dimer No results for input(s): DDIMER in the last 72 hours. Hgb A1c No results  for input(s): HGBA1C in the last 72 hours. Lipid Profile No results for input(s): CHOL, HDL, LDLCALC, TRIG, CHOLHDL, LDLDIRECT in the last 72 hours. Thyroid  function studies No results for input(s): TSH, T4TOTAL, T3FREE, THYROIDAB in the last 72 hours.  Invalid input(s): FREET3 Anemia work up No results for input(s): VITAMINB12, FOLATE, FERRITIN, TIBC, IRON, RETICCTPCT in the last 72 hours. Urinalysis    Component Value Date/Time   COLORURINE YELLOW 09/01/2024 0030   APPEARANCEUR CLEAR 09/01/2024 0030   LABSPEC 1.013 09/01/2024 0030   PHURINE 5.0 09/01/2024 0030   GLUCOSEU NEGATIVE 09/01/2024 0030   HGBUR NEGATIVE 09/01/2024 0030   BILIRUBINUR NEGATIVE 09/01/2024 0030   KETONESUR NEGATIVE 09/01/2024 0030   PROTEINUR NEGATIVE 09/01/2024 0030   NITRITE NEGATIVE 09/01/2024 0030   LEUKOCYTESUR NEGATIVE 09/01/2024 0030   Sepsis Labs Recent Labs  Lab 09/11/24 1850 09/12/24 0416 09/13/24 0506  WBC 11.0* 12.5* 11.0*   Microbiology Recent Results (from the past 240 hours)  Resp panel by RT-PCR (RSV, Flu A&B, Covid) Anterior Nasal Swab     Status: None   Collection Time: 09/11/24  6:30 PM   Specimen: Anterior Nasal Swab  Result Value Ref Range Status   SARS Coronavirus 2 by RT PCR NEGATIVE NEGATIVE Final    Comment: (NOTE) SARS-CoV-2 target nucleic acids are NOT DETECTED.  The SARS-CoV-2 RNA is generally detectable in upper respiratory specimens during the acute phase of infection. The lowest concentration of SARS-CoV-2 viral copies this assay can detect is 138 copies/mL. A negative result does not preclude SARS-Cov-2 infection and should not be used as the sole basis for treatment or other patient management decisions. A negative result may occur with  improper specimen collection/handling, submission of specimen other than nasopharyngeal swab, presence of viral mutation(s) within the areas targeted by this assay, and inadequate number of  viral  copies(<138 copies/mL). A negative result must be combined with clinical observations, patient history, and epidemiological information. The expected result is Negative.  Fact Sheet for Patients:  bloggercourse.com  Fact Sheet for Healthcare Providers:  seriousbroker.it  This test is no t yet approved or cleared by the [place]  FDA and  has been authorized for detection and/or diagnosis of SARS-CoV-2 by FDA under an Emergency Use Authorization (EUA). This EUA will remain  in effect (meaning this test can be used) for the duration of the COVID-19 declaration under Section 564(b)(1) of the Act, 21 U.S.C.section 360bbb-3(b)(1), unless the authorization is terminated  or revoked sooner.       Influenza A by PCR NEGATIVE NEGATIVE Final   Influenza B by PCR NEGATIVE NEGATIVE Final    Comment: (NOTE) The Xpert Xpress SARS-CoV-2/FLU/RSV plus assay is intended as an aid in the diagnosis of influenza from Nasopharyngeal swab specimens and should not be used as a sole basis for treatment. Nasal washings and aspirates are unacceptable for Xpert Xpress SARS-CoV-2/FLU/RSV testing.  Fact Sheet for Patients: bloggercourse.com  Fact Sheet for Healthcare Providers: seriousbroker.it  This test is not yet approved or cleared by the [place]  FDA and has been authorized for detection and/or diagnosis of SARS-CoV-2 by FDA under an Emergency Use Authorization (EUA). This EUA will remain in effect (meaning this test can be used) for the duration of the COVID-19 declaration under Section 564(b)(1) of the Act, 21 U.S.C. section 360bbb-3(b)(1), unless the authorization is terminated or revoked.     Resp Syncytial Virus by PCR NEGATIVE NEGATIVE Final    Comment: (NOTE) Fact Sheet for Patients: bloggercourse.com  Fact Sheet for Healthcare  Providers: seriousbroker.it  This test is not yet approved or cleared by the [place]  FDA and has been authorized for detection and/or diagnosis of SARS-CoV-2 by FDA under an Emergency Use Authorization (EUA). This EUA will remain in effect (meaning this test can be used) for the duration of the COVID-19 declaration under Section 564(b)(1) of the Act, 21 U.S.C. section 360bbb-3(b)(1), unless the authorization is terminated or revoked.  Performed at [facility], [address] [place], [place] [address]    Time coordinating discharge:  40 mins  SIGNED:  Afton Louder, MD  [facility] 09/13/2024, 11:59 AM How to contact the [facility] Attending or Consulting provider 7A - 7P or covering provider during after hours 7P -7A, for this patient?  Check the care team in [facility] and look for a) attending/consulting [facility] provider listed and b) the TRH team listed Log into [email or web address] and use 's universal password to access. If you do not have the password, please contact the hospital operator. Locate the TRH provider you are looking for under [facility] and page to a number that you can be directly reached. If you still have difficulty reaching the provider, please page the [employer] (Director on Call) for the Hospitalists listed on amion for assistance.

## 2024-09-13 NOTE — Discharge Instructions (Signed)
IMPORTANT INFORMATION: PAY CLOSE ATTENTION   PHYSICIAN DISCHARGE INSTRUCTIONS  Follow with Primary care provider  Rakes, Linda M, FNP  and other consultants as instructed by your Hospitalist Physician  SEEK MEDICAL CARE OR RETURN TO EMERGENCY ROOM IF SYMPTOMS COME BACK, WORSEN OR NEW PROBLEM DEVELOPS   Please note: You were cared for by a hospitalist during your hospital stay. Every effort will be made to forward records to your primary care provider.  You can request that your primary care provider send for your hospital records if they have not received them.  Once you are discharged, your primary care physician will handle any further medical issues. Please note that NO REFILLS for any discharge medications will be authorized once you are discharged, as it is imperative that you return to your primary care physician (or establish a relationship with a primary care physician if you do not have one) for your post hospital discharge needs so that they can reassess your need for medications and monitor your lab values.  Please get a complete blood count and chemistry panel checked by your Primary MD at your next visit, and again as instructed by your Primary MD.  Get Medicines reviewed and adjusted: Please take all your medications with you for your next visit with your Primary MD  Laboratory/radiological data: Please request your Primary MD to go over all hospital tests and procedure/radiological results at the follow up, please ask your primary care provider to get all Hospital records sent to his/her office.  In some cases, they will be blood work, cultures and biopsy results pending at the time of your discharge. Please request that your primary care provider follow up on these results.  If you are diabetic, please bring your blood sugar readings with you to your follow up appointment with primary care.    Please call and make your follow up appointments as soon as possible.    Also Note  the following: If you experience worsening of your admission symptoms, develop shortness of breath, life threatening emergency, suicidal or homicidal thoughts you must seek medical attention immediately by calling 911 or calling your MD immediately  if symptoms less severe.  You must read complete instructions/literature along with all the possible adverse reactions/side effects for all the Medicines you take and that have been prescribed to you. Take any new Medicines after you have completely understood and accpet all the possible adverse reactions/side effects.   Do not drive when taking Pain medications or sleeping medications (Benzodiazepines)  Do not take more than prescribed Pain, Sleep and Anxiety Medications. It is not advisable to combine anxiety,sleep and pain medications without talking with your primary care practitioner  Special Instructions: If you have smoked or chewed Tobacco  in the last 2 yrs please stop smoking, stop any regular Alcohol  and or any Recreational drug use.  Wear Seat belts while driving.  Do not drive if taking any narcotic, mind altering or controlled substances or recreational drugs or alcohol.       

## 2024-09-13 NOTE — TOC Transition Note (Signed)
 Transition of Care [facility]) - Discharge Note   Patient Details  Name: Bryan Shepard. MRN: [id number] Date of Birth: 1955/11/12  [facility] CM/SW Contact:  Sharlyne Stabs, RN Phone Number: 09/13/2024, 11:54 AM   Clinical Narrative:   Discharging home to follow with cardiology, Initially thought to transfer to [facility]. Heart failure education added to AVS.    Final next level of care: Home/Self Care       Patient and family notified of of transfer: 09/13/24  Discharge Plan and Services Additional resources added to the After Visit Summary for       Social Drivers of Health (SDOH) Interventions SDOH Screenings   Food Insecurity: No Food Insecurity (09/12/2024)  Housing: Low Risk  (09/12/2024)  Transportation Needs: No Transportation Needs (09/12/2024)  Utilities: Not At Risk (09/12/2024)  Alcohol Screen: Low Risk  (03/18/2024)  Depression (PHQ2-9): Low Risk  (09/07/2024)  Financial Resource Strain: Low Risk  (05/05/2024)  Physical Activity: Sufficiently Active (05/05/2024)  Recent Concern: Physical Activity - Insufficiently Active (03/18/2024)  Social Connections: Socially Integrated (09/12/2024)  Stress: No Stress Concern Present (05/05/2024)  Tobacco Use: Low Risk  (09/11/2024)  Health Literacy: Adequate Health Literacy (03/18/2024)

## 2024-09-13 NOTE — Progress Notes (Signed)
 Mobility Specialist Progress Note:    09/13/24 1030  Mobility  Activity Ambulated with assistance  Level of Assistance Modified independent, requires aide device or extra time  Assistive Device Front wheel walker  Distance Ambulated (ft) 140 ft  Range of Motion/Exercises Active;All extremities  Activity Response Tolerated well  Mobility Referral Yes  Mobility visit 1 Mobility  Mobility Specialist Start Time (ACUTE ONLY) 1030  Mobility Specialist Stop Time (ACUTE ONLY) 1055  Mobility Specialist Time Calculation (min) (ACUTE ONLY) 25 min   Pt received in bed, agreeable to mobility. ModI to stand and ambulate with RW. Tolerated well, c/o lightheadedness during ambulation and required a seated break. SpO2 98% on RA at rest, SpO2 96-99% on RA during ambulation. During session HR peaked at 118 bpm. BP readings below, returned supine. Family in room, all needs met.   Blood pressure results:  Lying: 127/86  HR: 93 Seated: 110/83  HR: 108 Standing: 100/65  HR: 109   [name] Mobility Specialist Please contact via Special Educational Needs Teacher or  Rehab office at [phone]

## 2024-09-13 NOTE — Progress Notes (Signed)
   09/13/24 1117  ReDS Vest / Clip  BMI (Calculated) 28.1  Station Marker D  Ruler Value 44  ReDS Value Range < 36  ReDS Actual Value 23

## 2024-09-13 NOTE — Consult Note (Addendum)
 Cardiology Consultation   Patient ID: Bryan Shepard. MRN: [id number]; DOB: September 16, 1956  Admit date: 09/11/2024 Date of Consult: 09/13/2024  PCP:  Severa Rock HERO, FNP   [facility] Providers Cardiologist:  Diannah SHAUNNA Maywood, MD     Patient Profile: Bryan Shepard. is a 68 y.o. 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 Vicci.  History of Present Illness: [name] was previously followed by [facility] in 08/2015 following his CVA and underwent ETT and TEE at that time which were reassuring.Did undergo placement of an ILR.  No follow-up with cardiology since that time  First seen by heart care and CT surgery in hospitalization 11/23-30/2025 for chest pain diagnosed with NSTEMI as below.  EKG showed NSR, HR 81 with PAC's and slight ST depression along the lateral leads. Tn 100, 172, 284, 303 and 409 . No significant postop complications.  Noted follow-up CXR showed improvement of atelectasis and trace left pleural effusion.  Treated with IV Lasix .  Due to poorly controlled DM, started on metformin  500 mg twice daily.  Discontinued enalapril  2.5 mg.  Continued on ASA 81 mg, Plavix  75 mg daily, Lasix  40 mg daily, Lopressor  25 mg twice daily, KCl 20 mEq daily, Crestor  20 mg daily.  CAD  ECHO 08/30/2024: EF 60 to 65%, normal LV/RV function, mild LVH, G1 DD, trivial MV regurgitation NSTEMI with cath 11/24 that showed severe multivessel disease s/p CABGX4 (LIMA-LAD, SVG-OM, SVG-ramus, SVG-PDA) on 09/01/2024.  Ascending aortic aneurysm Measured 4.2 cm by CT during 08/2024 admission  Presented to [facility] 09/11/2024 with acute onset of worsening DOE. proBNP 2578, TN 128 > 129, K 4.5 > 3.7, Mg 2.4, CR 1.28 > 1.49, AST 164 > 87, ALT 303 > 218, elevated WBC 11 with elevated  neutrophils 8, negative viral respiratory panel, elevated platelets 421K EKG: NSR, HR 87, nonspecific ST and T wave changes without signs of acute ischemia CXR without any acute findings Chest CTA with no PE but showed small left pleural effusion and left lower lobe atelectasis. ECHO 09/12/2024: EF 55 to 60%, normal LV/RV function, moderate LVH, G1 DD, mildly dilated LA, trivial MV regurgitation, AV sclerosis without AS.  Case was discussed with CT surgery Dr. Shyrl who recommended medical admission and cardiology evaluation.  Also reviewed with cardiology fellow Dr. Milissa at [facility].  Treated with IV Lasix  40 mg X2.   On interview, patient reports worsening SOB since Thursday associated with standing, walking to restroom, and up stairs in his home.  He has not noticed any significant increase in urine output with home Lasix  or IV Lasix .  Also reports soreness on chest around the sutures but no angina associated with exertion.  He has not been that active since he has been home with the most exertion around the house.  Currently, reports improvement in SOB.  He was able to walk to restroom this morning, brush teeth and shave without any SOB or angina.  Denies any orthopnea, edema, dizziness, palpitations, syncope.  Admits to daily compliance with medications.  Denies any excessive salt consumption.  Denies any tobacco, EtOH, or drug use.    Past Medical History:  Diagnosis Date   Acid reflux    Colon polyps    Diabetes mellitus type 2 in nonobese (  HCC) 12/13/2020   Family history of genetic mutation for hereditary nonpolyposis colorectal cancer (HNPCC)    Family history of prostate cancer    Family history of stomach cancer    HOH (hard of hearing)    Hypertension    Sleep apnea    CPAP   Stroke (HCC) 2015   TIA (transient ischemic attack) 2016    Past Surgical History:  Procedure Laterality Date   CHOLECYSTECTOMY N/A 04/17/2020   Procedure: LAPAROSCOPIC CHOLECYSTECTOMY;  Surgeon:  Mavis Anes, MD;  Location: [facility] ORS;  Service: General;  Laterality: N/A;   COLONOSCOPY  2018   COLONOSCOPY WITH PROPOFOL  N/A 12/07/2021   Surgeon: Cindie Dunnings K, DO;   nonbleeding internal hemorrhoids, 8 mm polyp (tubular adenoma) in the descending colon removed.  Recommended 5-year surveillance.   CORONARY ARTERY BYPASS GRAFT N/A 09/01/2024   Procedure: CORONARY ARTERY BYPASS GRAFTING X 4, USING LEFT INTERAL MAMMARY ARTERY AND ENDOSCOPIC HARVESTED LEFT GREATER SAPHENOUS VEIN;  Surgeon: Daniel Con RAMAN, MD;  Location: [facility] OR;  Service: Open Heart Surgery;  Laterality: N/A;   INTRAOPERATIVE TRANSESOPHAGEAL ECHOCARDIOGRAM N/A 09/01/2024   Procedure: ECHOCARDIOGRAM, TRANSESOPHAGEAL, INTRAOPERATIVE;  Surgeon: Daniel Con RAMAN, MD;  Location: [facility] OR;  Service: Open Heart Surgery;  Laterality: N/A;   LEFT HEART CATH AND CORONARY ANGIOGRAPHY N/A 08/30/2024   Procedure: LEFT HEART CATH AND CORONARY ANGIOGRAPHY;  Surgeon: Mady Bruckner, MD;  Location: [facility] INVASIVE CV LAB;  Service: Cardiovascular;  Laterality: N/A;   POLYPECTOMY  12/07/2021   Procedure: POLYPECTOMY;  Surgeon: Cindie Dunnings POUR, DO;  Location: [facility] ENDO SUITE;  Service: Endoscopy;;     Home Medications:  Prior to Admission medications   Medication Sig Start Date End Date Taking? Authorizing Provider  Accu-Chek FastClix Lancets MISC CHECK BS DAILY DX E11.9 01/07/24   Severa Rock HERO, FNP  acetaminophen  (TYLENOL ) 500 MG tablet Take 1-2 tablets (500-1,000 mg total) by mouth every 6 (six) hours as needed. 09/05/24   Barrett, Erin R, PA-C  albuterol  (VENTOLIN  HFA) 108 (90 Base) MCG/ACT inhaler Inhale 2 puffs into the lungs every 6 (six) hours as needed for shortness of breath. 06/19/24   [provider]  aspirin  81 MG chewable tablet Chew 81 mg by mouth daily.    [provider]  Blood Glucose Monitoring Suppl (ACCU-CHEK GUIDE ME) w/Device KIT CHECK BS DAILY DX E11.9 07/07/23   Rakes, Rock HERO, FNP  clopidogrel  (PLAVIX ) 75 MG tablet  Take 1 tablet (75 mg total) by mouth daily. 09/05/24 09/05/25  Barrett, Erin R, PA-C  Continuous Glucose Sensor (DEXCOM G7 SENSOR) MISC 1 Device by Does not apply route every 14 (fourteen) days. 05/05/24   Severa Rock HERO, FNP  furosemide  (LASIX ) 40 MG tablet Take 1 tablet (40 mg total) by mouth daily. 09/05/24   Barrett, Erin R, PA-C  glucose blood (FREESTYLE LITE) test strip Check BS daily Dx E11.9 01/07/24   Severa Rock HERO, FNP  metFORMIN  (GLUCOPHAGE ) 500 MG tablet Take 1 tablet (500 mg total) by mouth 2 (two) times daily with a meal. 09/05/24   Barrett, Erin R, PA-C  methocarbamol  (ROBAXIN ) 500 MG tablet Take 1 tablet (500 mg total) by mouth every 8 (eight) hours as needed for muscle spasms. 09/05/24   Barrett, Erin R, PA-C  metoprolol  tartrate (LOPRESSOR ) 25 MG tablet Take 1 tablet (25 mg total) by mouth 2 (two) times daily. 09/05/24   Barrett, Rocky SAUNDERS, PA-C  potassium chloride  SA (KLOR-CON  M) 20 MEQ tablet Take 1 tablet (20 mEq total)  by mouth daily. 09/05/24   Barrett, Erin R, PA-C  rosuvastatin  (CRESTOR ) 20 MG tablet Take 1 tablet (20 mg total) by mouth daily at 6 PM. 09/05/24   Barrett, Erin R, PA-C  traMADol  (ULTRAM ) 50 MG tablet Take 1 tablet (50 mg total) by mouth every 4 (four) hours as needed for moderate pain (pain score 4-6). 09/05/24   Barrett, Erin R, PA-C    Scheduled Meds:  aspirin   81 mg Oral Daily   clopidogrel   75 mg Oral Daily   enoxaparin  (LOVENOX ) injection  40 mg Subcutaneous Q24H   furosemide   40 mg Intravenous Daily   insulin  aspart  0-15 Units Subcutaneous TID WC   insulin  aspart  0-5 Units Subcutaneous QHS   metoprolol  tartrate  25 mg Oral BID   potassium chloride  SA  20 mEq Oral Daily   rosuvastatin   20 mg Oral q1800   Continuous Infusions:  PRN Meds: acetaminophen  **OR** acetaminophen , albuterol , LORazepam , magnesium  hydroxide, methocarbamol , ondansetron  **OR** ondansetron  (ZOFRAN ) IV, traMADol , traZODone   Allergies:   No Known Allergies  Social History:    Social History   Socioeconomic History   Marital status: Married    Spouse name: Virginia    Number of children: Not on file   Years of education: Not on file   Highest education level: Bachelor's degree (e.g., BA, AB, BS)  Occupational History   Occupation: retired     Comment: [employer]   Tobacco Use   Smoking status: Never   Smokeless tobacco: Never  Vaping Use   Vaping status: Never Used  Substance and Sexual Activity   Alcohol use: No   Drug use: No   Sexual activity: Yes    Birth control/protection: None  Other Topics Concern   Not on file  Social History Narrative   Married , 1 step daughter    Family History:    Family History  Problem Relation Age of Onset   Melanoma Mother 60   Hypertension Mother    Transient ischemic attack Mother    Stroke Mother    Heart disease Mother    Prostate cancer Father 54   Stomach cancer Father 62       PMS2+; Lynch syndrome   Melanoma Father    Diabetes Father    Heart disease Father    Stroke Maternal Grandmother    Lung cancer Paternal Grandmother    Throat cancer Paternal Grandmother    Prostate cancer Paternal Grandfather 46   Bladder Cancer Other        MGM's mother with urethra cancer     ROS:  Please see the history of present illness.  All other ROS reviewed and negative.     Physical Exam/Data: Vitals:   09/12/24 1925 09/12/24 2109 09/13/24 0130 09/13/24 0446  BP: 104/73 105/75 115/77 114/76  Pulse: 81 84 98 99  Resp: 18  18 18   Temp: 98.4 F (36.9 C)  98.4 F (36.9 C) 97.8 F (36.6 C)  TempSrc: Oral  Oral Oral  SpO2: 98%  96% 98%  Weight:    85.1 kg    Intake/Output Summary (Last 24 hours) at 09/13/2024 0859 Last data filed at 09/12/2024 1720 Gross per 24 hour  Intake 240 ml  Output --  Net 240 ml      09/13/2024    4:46 AM 09/05/2024    3:10 AM 09/04/2024    5:00 AM  Last 3 Weights  Weight (lbs) 187 lb 9.8 oz 197 lb 6.4 oz 202 lb 9.6  oz  Weight (kg) 85.1 kg 89.54 kg 91.9 kg     Body mass  index is 29.38 kg/m.  General:  Well nourished, well developed, in no acute distress HEENT: normal Neck: no JVD Vascular: No carotid bruits; Distal pulses 2+ bilaterally Cardiac:  normal S1, S2; RRR; no murmur; CABG surgical incisions are healing without evidence of infection  Lungs:  clear to auscultation bilaterally, no wheezing, rhonchi or rales  Abd: soft, nontender, no hepatomegaly  Ext: no edema Musculoskeletal:  No deformities, BUE and BLE strength normal and equal Skin: warm and dry  Neuro:  CNs 2-12 intact, no focal abnormalities noted Psych:  Normal affect   EKG:  The EKG was personally reviewed and demonstrates:  NSR, HR 87, nonspecific ST and T wave changes without signs of acute ischemia  Telemetry:  Telemetry was personally reviewed and demonstrates:  NSR, HR 70-80's   Relevant CV Studies: Cath 08/30/2024    Prox RCA lesion is 40% stenosed.   Dist RCA lesion is 100% stenosed.   Ramus lesion is 95% stenosed with 90% stenosed side Chelsea Pedretti in Lat Ramus.   1st Mrg lesion is 95% stenosed.   RPAV lesion is 100% stenosed.   RPDA lesion is 100% stenosed.   Prox LAD lesion is 90% stenosed.   Mid LAD lesion is 80% stenosed.   LV end diastolic pressure is mildly elevated.   There is no aortic valve stenosis.   Anticipated discharge date to be determined.   Continue aspirin  81 mg daily.  Defer P2Y12 inhibitor pending cardiac surgery consultation.  Resume heparin  infusion 2 hours after TR band removal.   Conclusions: Severe three-vessel coronary artery disease, as detailed below, including sequential 90% and 80% proximal/mid LAD stenoses, 90-95% stenoses of bifurcating ramus intermedius, 95% stenosis of OM1, and chronic total occlusion of distal RCA. Mildly elevated left ventricular filling pressure (LVEDP 20 mmHg).   Recommendations: Inpatient cardiac surgery consultation for CABG. Resume heparin  infusion 2 hours after TR band has been removed. Aggressive secondary  prevention of coronary artery disease. Transition from nitroglycerin  paste to isosorbide  mononitrate for antianginal therapy.  ECHO IMPRESSIONS 09/12/2024  1. Left ventricular ejection fraction, by estimation, is 55 to 60%. The left ventricle has normal function. The left ventricle has no regional wall motion abnormalities. There is moderate left ventricular hypertrophy. Left ventricular diastolic  parameters are consistent with Grade I diastolic dysfunction (impaired relaxation).   2. Right ventricular systolic function is normal. The right ventricular size is normal.   3. Left atrial size was mildly dilated.   4. The mitral valve is abnormal. Trivial mitral valve regurgitation. No evidence of mitral stenosis.   5. The aortic valve is tricuspid. There is moderate calcification of the aortic valve. There is moderate thickening of the aortic valve. Aortic valve regurgitation is not visualized. Aortic valve sclerosis is present, with no evidence of aortic valve stenosis.   6. The inferior vena cava is normal in size with greater than 50% respiratory variability, suggesting right atrial pressure of 3 mmHg.   Laboratory Data: High Sensitivity Troponin:  No results for input(s): TROPONINIHS in the last 720 hours.   Chemistry Recent Labs  Lab 09/11/24 1850 09/12/24 0416 09/13/24 0506  NA 134* 133* 134*  K 4.5 4.0 3.7  CL 99 97* 97*  CO2 19* 26 25  GLUCOSE 174* 217* 171*  BUN 30* 29* 35*  CREATININE 1.28* 1.34* 1.49*  CALCIUM  9.3 9.1 9.1  MG  --   --  2.4  GFRNONAA >60 58* 51*  ANIONGAP 16* 11 13    Recent Labs  Lab 09/11/24 1850 09/12/24 0416 09/13/24 0506  PROT 7.1 6.9 6.7  ALBUMIN  3.9 3.9 3.9  AST 164* 126* 87*  ALT 303* 273* 218*  ALKPHOS 95 96 96  BILITOT 0.6 0.8 1.1   Lipids No results for input(s): CHOL, TRIG, HDL, LABVLDL, LDLCALC, CHOLHDL in the last 168 hours.  Hematology Recent Labs  Lab 09/11/24 1850 09/12/24 0416 09/13/24 0506  WBC 11.0* 12.5*  11.0*  RBC 4.66 4.70 4.52  HGB 13.4 13.6 13.0  HCT 39.7 39.8 38.7*  MCV 85.2 84.7 85.6  MCH 28.8 28.9 28.8  MCHC 33.8 34.2 33.6  RDW 13.1 13.2 13.3  PLT 421* 458* 449*   Thyroid  No results for input(s): TSH, FREET4 in the last 168 hours.  BNP Recent Labs  Lab 09/11/24 1850  PROBNP 2,578.0*    DDimer No results for input(s): DDIMER in the last 168 hours.  Radiology/Studies:  CT Angio Chest PE W and/or Wo Contrast Result Date: 09/11/2024 EXAM: CTA of the Chest with contrast for PE 09/11/2024 07:50:59 PM TECHNIQUE: CTA of the chest was performed without and with the administration of 75 mL of iohexol  (OMNIPAQUE ) 350 MG/ML injection. Multiplanar reformatted images are provided for review. MIP images are provided for review. Automated exposure control, iterative reconstruction, and/or weight based adjustment of the mA/kV was utilized to reduce the radiation dose to as low as reasonably achievable. COMPARISON: 08/29/2024 CLINICAL HISTORY: Pulmonary embolism (PE) suspected, high prob. FINDINGS: PULMONARY ARTERIES: Pulmonary arteries are adequately opacified for evaluation. No pulmonary embolism. Main pulmonary artery is normal in caliber. MEDIASTINUM: The heart demonstrates prior CABG. Aortic atherosclerosis. The pericardium demonstrates no acute abnormality. LYMPH NODES: No mediastinal, hilar or axillary lymphadenopathy. LUNGS AND PLEURA: Small left pleural effusion with left lower lobe atelectasis. No pneumothorax. UPPER ABDOMEN: Limited images of the upper abdomen are unremarkable. SOFT TISSUES AND BONES: No acute bone or soft tissue abnormality. IMPRESSION: 1. No pulmonary embolism. 2. Small left pleural effusion with left lower lobe atelectasis. Electronically signed by: Franky Crease MD 09/11/2024 08:00 PM EST RP Workstation: [id number]   DG Chest Portable 1 View Result Date: 09/11/2024 EXAM: 1 VIEW(S) XRAY OF THE CHEST 09/11/2024 06:24:00 PM COMPARISON: 6 days ago status post coronary  artery bypass graft stable cardiomediastinal silhouette. CLINICAL HISTORY: dyspnea FINDINGS: LUNGS AND PLEURA: No focal pulmonary opacity. No pleural effusion. No pneumothorax. HEART AND MEDIASTINUM: No acute abnormality of the cardiac and mediastinal silhouettes. BONES AND SOFT TISSUES: No acute osseous abnormality. IMPRESSION: 1. No acute cardiopulmonary process. Electronically signed by: Bryan Seip MD 09/11/2024 06:32 PM EST RP Workstation: [id number]     Assessment and Plan: Acute HFpEF ECHO 09/12/2024: EF 55 to 60%, normal LV/RV function, moderate LVH, G1 DD, mildly dilated LA, trivial MV regurgitation, AV sclerosis without AS.  Presents with worsening SOB with minimal exertion since Thursday.  Currently, reports improvement in SOB.  He was able to walk to restroom this morning, she finish a without any concerns. Denies edema, orthopnea, PND or significant weight gain.  proBNP 2578, TN 128 > 129 Chest CTA showed small left pleural effusion. Treated with IV Lasix  40 mg X2.  Decreased IV Lasix  from 40 mg twice daily to daily. Appears Euvolemic on exam.  Net I/O: +240, Wt 187> no follow up weight.  Cr CR 1.28 > 1.49. Order Red Vest.  Suspect I/O's  are inaccurate as patient denies any output measurements. Continue with strict I/O, daily  standing weights if possible, and monitoring renal function.   Recommend switching to p.o. Torsemide  since no significant urine output on  Lasix .  Discussed the benefits of SGLT2 with history of CAD, CKD and DM2.  Patient denies any UTIs.  Recommend starting Jardiance  10 mg daily for  some additional diuresis benefit. Encouraged low sodium diet, fluid restriction <2L, and daily weights.  Educated to contact our office for weight gain of 2 lbs overnight or 5 lbs in one week. ED precautions discussed.    Elevated transaminase  AST 164 > 87, ALT 303 > 218. Improvement in LFT with IV diuresis.  Would consider switching PO lasix  to torsemide  upon discharge.   CAD  s/p CABG HLD, LDL less than 55 Underwent four-vessel CABGon 09/01/2024 as above.  TN 128 > 129 Reports soreness on chest around incision, but denies any angina. EKG without any ischemic changes. Tele: NST, HR 70-80's Echo as above is unremarkable with no significant changes. Less concern for ACS at this time.  No need for further ischemic evaluation at this time. Continue ASA 81 mg, Plavix  75 mg daily, Lopressor  25 mg twice daily, Crestor  20 mg daily  HTN  Initial BPs with elevation 140s/80s.  Most recent BP controlled 114/76 Continue on Lopressor  25 mg twice daily  DM 2 A1C 8.4 in 08/2024. Started on Metformin  at that time  Managed by primary team  AKI on CKD S3 CR 1.28 > 1.49 in IV lasix  Continue to monitor.   Leukocytosis Elevated WBC 11 with elevated neutrophils 8, negative viral respiratory panel Could be secondary to physical stress from CABG surgery   Risk Assessment/Risk Scores:  [place]  Heart Association (NYHA) Functional Class NYHA Class III    For questions or updates, please contact [facility] Please consult [email or web address] for contact info under      Signed, Lorette CINDERELLA Kapur, PA-C  09/13/2024 8:59 AM   Attending Note   Patient seen and discussed with PA Kapur, I agree with her documentation. 68 yo male history of HTN, HLD, DM2, prior CVA. Admit 08/29/24 with NSTEMI, cath with severe 3 vessel CAD as described below.  09/01/24 CABG SVG-PDA, SVG-OM, SVG-ramus, LIMA-LAD. Admitted with SOB, weakness, fatigue.        WBC 11 Hgb 13.4 Plt 421 K 4.5 Cr 1.28 BUN 30 AST 164 ALT 303 proBNP 2578 Trop 128-->129 EKG: SR, no acute ischemic changes CXR no acute process CT PE: no PE, small left pleural effusion   08/30/24 echo: LVE 60-65%, grade I dd,  09/12/24 echo: LVE 55-60%, no WMAs, grade I dd, normal RV function.      1.Acute HFpEF 08/30/24 echo: LVE 60-65%, grade I dd,  09/12/24 echo: LVE 55-60%, no WMAs, grade I dd, normal RV function.  CXR  no acute process CT PE: no PE, small left pleural effusion - proBNP 2578    - has been diuresed, though I/Os incomplete. Mild uptrend in Cr, IV lasix  has been stopped. He was on oral lasix  40mg  at home, change to torsemide  20mg  daily at discharge.  - ambulate patient and document symptoms, if does well would be ok for discharge.  - in setting of HFpEF start jardiance  10mg  daily     2.CAD - 11/02/23 CABG SVG-PDA, SVG-OM, SVG-ramus, LIMA-LAD - mediacal therapy with ASA/plavix  given recent CABG in setting of NSTEMI. He is also on lopressor  25mg  bid, crestor  20mg  daily.   -no acute issues.    3.Elevated LFTs - admit AST 164, ALT 303 -  downtrending over admission, perhaps congestion related.  - from chart review statin looks to be new since his admission last month with NSTEMI. With the downtrend I would think less likely statin related, would repeat LFTs at outpatient appt       Dorn Ross MD

## 2024-09-14 ENCOUNTER — Telehealth: Payer: Self-pay

## 2024-09-14 ENCOUNTER — Other Ambulatory Visit: Payer: Self-pay

## 2024-09-14 ENCOUNTER — Encounter: Payer: Self-pay | Admitting: Family Medicine

## 2024-09-14 ENCOUNTER — Ambulatory Visit (INDEPENDENT_AMBULATORY_CARE_PROVIDER_SITE_OTHER): Admitting: Family Medicine

## 2024-09-14 VITALS — BP 103/71 | HR 84 | Temp 96.6°F | Ht 67.0 in | Wt 183.8 lb

## 2024-09-14 DIAGNOSIS — Z09 Encounter for follow-up examination after completed treatment for conditions other than malignant neoplasm: Secondary | ICD-10-CM | POA: Diagnosis not present

## 2024-09-14 DIAGNOSIS — Z951 Presence of aortocoronary bypass graft: Secondary | ICD-10-CM

## 2024-09-14 DIAGNOSIS — I214 Non-ST elevation (NSTEMI) myocardial infarction: Secondary | ICD-10-CM

## 2024-09-14 DIAGNOSIS — E1169 Type 2 diabetes mellitus with other specified complication: Secondary | ICD-10-CM | POA: Diagnosis not present

## 2024-09-14 DIAGNOSIS — I5033 Acute on chronic diastolic (congestive) heart failure: Secondary | ICD-10-CM | POA: Diagnosis not present

## 2024-09-14 NOTE — Transitions of Care (Post Inpatient/ED Visit) (Signed)
   09/14/2024  Name: Bryan Shepard Bryan Shepard. MRN: [id number] DOB: [birth year]  Today's TOC FU Call Status: Today's TOC FU Call Status:: Successful TOC FU Call Completed TOC FU Call Complete Date: 09/14/24  Patient's Name and Date of Birth confirmed. DOB, Name   Home Care and Equipment/Supplies: Were Home Health Services Ordered?: Yes Name of Home Health Agency:: [facility] - contacted agency via conference call with wife whose only concern was getting home health restarted - Spoke with Bryan Shepard and Bryan Shepard will be contacting wife to schedule ROC visit    Follow up appointments reviewed: PCP Follow-up appointment confirmed?: Yes Date of PCP follow-up appointment?: 09/14/24 Follow-up Provider: PCP: Rock Bruns Specialist [facility] Follow-up appointment confirmed?: Yes Date of Specialist follow-up appointment?: 09/15/24 Follow-Up Specialty Provider:: cardio Manuelita Rough 09/15/24 [facility] s/p CABG 09/27/24 -  f/u rectal bleeding Charmaine Melia 09/29/24  - [facility] cardio 10/01/24 Do you need transportation to your follow-up appointment?: No  Wife states patient has many MD appts and already saw PCP today and she declined TOC - wife did ask for assistance with reaching [facility] stating the phone rings with no answer - we reached out via conference call and confirmed ROC orders were received and Bryan Shepard will be calling to schedule visit - wife voiced appreciation but declined TOC program   Shona Prow RN, CCM [facility]  VBCI-Population Health RN Care Manager [phone]

## 2024-09-14 NOTE — Progress Notes (Signed)
 Subjective:  Patient ID: Bryan Shepard., male    DOB: 1955/11/15, 68 y.o.   MRN: [id number]  Patient Care Team: Severa Rock HERO, FNP as PCP - General (Family Medicine) Mallipeddi, Diannah SQUIBB, MD as PCP - Cardiology (Cardiology) Jude Harden GAILS, MD as Consulting Physician (Pulmonary Disease) [facility] Mallipeddi, Diannah SQUIBB, MD as Consulting Physician (Cardiology)   Chief Complaint:  Hospitalization Follow-up (08/29/2024-09/05/2024 [facility] (7 days) - NSTEMI///09/11/2024 - 09/13/2024 (2 days)/[facility]- CHF)   HPI: Bryan Shepard. is a 68 y.o. male presenting on 09/14/2024 for Hospitalization Follow-up (08/29/2024-09/05/2024 [facility] (7 days) - NSTEMI///09/11/2024 - 09/13/2024 (2 days)/[facility]- CHF)   Bryan Shepard. is a 68 year old male with coronary artery disease who presents with shortness of breath and fatigue following recent CABG surgery.  He recently underwent coronary artery bypass grafting (CABG) surgery after experiencing chest tightness and elevated blood pressure. Initially, he had back pain during a vacation, which he attributed to sleeping arrangements. Upon returning home, he experienced severe chest tightness and elevated blood pressure, leading to a 911 call and subsequent CABG surgery.  Post-surgery, he was discharged on November 30th but was readmitted on December 6th due to worsening symptoms, including shortness of breath and changes in skin color. He was discharged again on December 8th.  He currently feels winded with minimal exertion, such as walking to the bathroom. Initially, after the first discharge, he managed stairs without difficulty, but his condition worsened after having visitors.  His medication regimen includes Plavix , aspirin , Jardiance , and metformin . Blood sugar is monitored via finger sticks after discontinuation of the Dexcom device.  He has a history of diabetes, managed with Jardiance  and metformin , and  has experienced stable blood sugars. He has lost weight due to dietary changes, currently weighing 178 pounds. No issues with surgical incisions or leg healing from vein harvesting are reported.      08/29/2024: Hospital Course: [name] remained stable on the heparin  infusion.  He decided to proceed with surgery and was taken to the OR on 09/01/24 where CABG x 4 was carried out, utilizing LIMA to LAD, SVG to PDA, SVG to OM, and SVG to Ramus Intermediate.  He also underwent endoscopic harvest of left leg greater saphenous vein.  He tolerated the procedure without difficulty and was taken to the SICU in stable condition.  He was extubated the evening of surgery without difficulty.  The patient did not require inotropic or vasopressor support post operatively.  He was mobilized on the first post-op day.  The mediastinal tubes and pacer wires were removed on the second post-op day and he was transferred to 4E Progressive Care.  The patient's chest tube output remained low with removal of chest tube on 11/29.  Follow up chest xray showed no pneumothorax, improvement of atelectasis, and trace left pleural effusion.  He was started on IV Lasix  to facilitate diuresis.  His diabetes is poorly controlled with preoperative A1c of 8.5.  He has been trying to control with diet which is has not been effective.  He will need close follow up with primary care physician.  He was started on Metformin  500 mg BID to aide in management of this.  He remains in NSR.  He is ambulating without difficulty.  His surgical incisions are healing without evidence of infection.  09/11/2024: Hospital course by listed problems addressed:   Acute CHF (congestive heart failure)  - mild and  responded well to IV lasix  and clinically much improved  - Will continue diuresis with IV Lasix  40 mg once daily  - Follow up 2D echo LVEF 09/12/24 EF 55 to 60%, normal LV/RV function, moderate LVH, G1 DD, mildly dilated LA, trivial MV regurgitation,  AV sclerosis without AS  - Cardiology consulted.  Reds vest reading was 23 and reassuring.  Pt was started on torsemide  20 mg daily and empagliflozin  10 mg daily.  Pt was ambulated again and did fairly well with some minor weakness.  He is agreeable to home health PT.  DC home today.  He has outpatient follow up with cardiology on 10/01/24.     Type 2 diabetes mellitus with vascular complications  - The patient will be placed on supplemental coverage with NovoLog . - resume home metformin  and empagliflozin  started and prescribed in hospital  CBG (last 3)  Recent Labs (last 2 labs)       Recent Labs    09/12/24 2022 09/13/24 0730 09/13/24 1135  GLUCAP 199* 163* 203*      Dyslipidemia - continue statin therapy.   Coronary artery disease - The patient is status post four-vessel CABG on 08/31/2024. - We will continue beta-blocker therapy with Lopressor  as well as statin therapy, aspirin  and Plavix .   OSA on CPAP Will resume CPAP nightly.   Discharge Diagnoses:  Principal Problem:   Acute CHF (congestive heart failure) (HCC) Active Problems:   OSA on CPAP   Coronary artery disease   Dyslipidemia   Type 2 diabetes mellitus without complications (HCC)   Shortness of breath   Acute heart failure (HCC)  Relevant past medical, surgical, family, and social history reviewed and updated as indicated.  Allergies and medications reviewed and updated. Data reviewed: Chart in Epic.   Past Medical History:  Diagnosis Date   Acid reflux    Colon polyps    Diabetes mellitus type 2 in nonobese (HCC) 12/13/2020   Family history of genetic mutation for hereditary nonpolyposis colorectal cancer (HNPCC)    Family history of prostate cancer    Family history of stomach cancer    HOH (hard of hearing)    Hypertension    Sleep apnea    CPAP   Stroke (HCC) 2015   TIA (transient ischemic attack) 2016    Past Surgical History:  Procedure Laterality Date   CHOLECYSTECTOMY N/A 04/17/2020    Procedure: LAPAROSCOPIC CHOLECYSTECTOMY;  Surgeon: Shepard Anes, MD;  Location: [facility] ORS;  Service: General;  Laterality: N/A;   COLONOSCOPY  2018   COLONOSCOPY WITH PROPOFOL  N/A 12/07/2021   Surgeon: Cindie Dunnings K, DO;   nonbleeding internal hemorrhoids, 8 mm polyp (tubular adenoma) in the descending colon removed.  Recommended 5-year surveillance.   CORONARY ARTERY BYPASS GRAFT N/A 09/01/2024   Procedure: CORONARY ARTERY BYPASS GRAFTING X 4, USING LEFT INTERAL MAMMARY ARTERY AND ENDOSCOPIC HARVESTED LEFT GREATER SAPHENOUS VEIN;  Surgeon: Daniel Con RAMAN, MD;  Location: [facility] OR;  Service: Open Heart Surgery;  Laterality: N/A;   INTRAOPERATIVE TRANSESOPHAGEAL ECHOCARDIOGRAM N/A 09/01/2024   Procedure: ECHOCARDIOGRAM, TRANSESOPHAGEAL, INTRAOPERATIVE;  Surgeon: Daniel Con RAMAN, MD;  Location: [facility] OR;  Service: Open Heart Surgery;  Laterality: N/A;   LEFT HEART CATH AND CORONARY ANGIOGRAPHY N/A 08/30/2024   Procedure: LEFT HEART CATH AND CORONARY ANGIOGRAPHY;  Surgeon: Mady Bruckner, MD;  Location: [facility] INVASIVE CV LAB;  Service: Cardiovascular;  Laterality: N/A;   POLYPECTOMY  12/07/2021   Procedure: POLYPECTOMY;  Surgeon: Cindie Dunnings POUR, DO;  Location: [facility] ENDO  SUITE;  Service: Endoscopy;;    Social History   Socioeconomic History   Marital status: Married    Spouse name: Virginia    Number of children: Not on file   Years of education: Not on file   Highest education level: Bachelor's degree (e.g., BA, AB, BS)  Occupational History   Occupation: retired     Comment: [employer]   Tobacco Use   Smoking status: Never   Smokeless tobacco: Never  Vaping Use   Vaping status: Never Used  Substance and Sexual Activity   Alcohol use: No   Drug use: No   Sexual activity: Yes    Birth control/protection: None  Other Topics Concern   Not on file  Social History Narrative   Married , 1 step daughter    Social Drivers of Corporate Investment Banker Strain: Low Risk  (05/05/2024)   Overall Financial  Resource Strain (CARDIA)    Difficulty of Paying Living Expenses: Not hard at all  Food Insecurity: No Food Insecurity (09/12/2024)   Hunger Vital Sign    Worried About Running Out of Food in the Last Year: Never true    Ran Out of Food in the Last Year: Never true  Transportation Needs: No Transportation Needs (09/12/2024)   PRAPARE - Administrator, Civil Service (Medical): No    Lack of Transportation (Non-Medical): No  Physical Activity: Sufficiently Active (05/05/2024)   Exercise Vital Sign    Days of Exercise per Week: 6 days    Minutes of Exercise per Session: 30 min  Recent Concern: Physical Activity - Insufficiently Active (03/18/2024)   Exercise Vital Sign    Days of Exercise per Week: 2 days    Minutes of Exercise per Session: 30 min  Stress: No Stress Concern Present (05/05/2024)   [employer] of Occupational Health - Occupational Stress Questionnaire    Feeling of Stress: Not at all  Social Connections: Socially Integrated (09/12/2024)   Social Connection and Isolation Panel    Frequency of Communication with Friends and Family: More than three times a week    Frequency of Social Gatherings with Friends and Family: More than three times a week    Attends Religious Services: More than 4 times per year    Active Member of [employer] or Organizations: Yes    Attends Banker Meetings: More than 4 times per year    Marital Status: Married  Catering Manager Violence: Not At Risk (09/12/2024)   Humiliation, Afraid, Rape, and Kick questionnaire    Fear of Current or Ex-Partner: No    Emotionally Abused: No    Physically Abused: No    Sexually Abused: No    Outpatient Encounter Medications as of 09/14/2024  Medication Sig   Accu-Chek FastClix Lancets MISC CHECK BS DAILY DX E11.9   acetaminophen  (TYLENOL ) 500 MG tablet Take 1-2 tablets (500-1,000 mg total) by mouth every 6 (six) hours as needed.   albuterol  (VENTOLIN  HFA) 108 (90 Base) MCG/ACT inhaler  Inhale 2 puffs into the lungs every 6 (six) hours as needed for shortness of breath.   aspirin  81 MG chewable tablet Chew 81 mg by mouth daily.   Blood Glucose Monitoring Suppl (ACCU-CHEK GUIDE ME) w/Device KIT CHECK BS DAILY DX E11.9   clopidogrel  (PLAVIX ) 75 MG tablet Take 1 tablet (75 mg total) by mouth daily.   Continuous Glucose Sensor (DEXCOM G7 SENSOR) MISC 1 Device by Does not apply route every 14 (fourteen) days.   empagliflozin  (JARDIANCE )  10 MG TABS tablet Take 1 tablet (10 mg total) by mouth daily.   glucose blood (FREESTYLE LITE) test strip Check BS daily Dx E11.9   metFORMIN  (GLUCOPHAGE ) 500 MG tablet Take 1 tablet (500 mg total) by mouth 2 (two) times daily with a meal.   methocarbamol  (ROBAXIN ) 500 MG tablet Take 1 tablet (500 mg total) by mouth every 8 (eight) hours as needed for muscle spasms.   metoprolol  tartrate (LOPRESSOR ) 25 MG tablet Take 1 tablet (25 mg total) by mouth 2 (two) times daily.   potassium chloride  SA (KLOR-CON  M) 20 MEQ tablet Take 1 tablet (20 mEq total) by mouth daily.   rosuvastatin  (CRESTOR ) 20 MG tablet Take 1 tablet (20 mg total) by mouth daily at 6 PM.   torsemide  (DEMADEX ) 20 MG tablet Take 1 tablet (20 mg total) by mouth daily.   traMADol  (ULTRAM ) 50 MG tablet Take 1 tablet (50 mg total) by mouth every 4 (four) hours as needed for moderate pain (pain score 4-6).   No facility-administered encounter medications on file as of 09/14/2024.    No Known Allergies  Pertinent ROS per HPI, otherwise unremarkable      Objective:  BP 103/71   Pulse 84   Temp (!) 96.6 F (35.9 C)   Ht 5' 7 (1.702 m)   Wt 183 lb 12.8 oz (83.4 kg)   SpO2 99%   BMI 28.79 kg/m    Wt Readings from Last 3 Encounters:  09/14/24 183 lb 12.8 oz (83.4 kg)  09/13/24 179 lb 7.3 oz (81.4 kg)  09/05/24 197 lb 6.4 oz (89.5 kg)    Physical Exam Vitals and nursing note reviewed.  Constitutional:      Appearance: He is ill-appearing. He is not toxic-appearing or  diaphoretic.  HENT:     Head: Normocephalic and atraumatic.     Nose: Nose normal.     Mouth/Throat:     Mouth: Mucous membranes are moist.  Eyes:     Pupils: Pupils are equal, round, and reactive to light.  Cardiovascular:     Rate and Rhythm: Normal rate and regular rhythm.     Heart sounds: Normal heart sounds. No murmur heard.    No friction rub. No gallop.  Pulmonary:     Effort: Pulmonary effort is normal.     Breath sounds: Normal breath sounds.  Chest:    Musculoskeletal:     Cervical back: Neck supple.     Right lower leg: No edema.     Left lower leg: No edema.  Skin:    General: Skin is warm and dry.     Capillary Refill: Capillary refill takes less than 2 seconds.     Coloration: Skin is pale.  Neurological:     General: No focal deficit present.     Mental Status: He is alert and oriented to person, place, and time.     Gait: Gait abnormal (slow).  Psychiatric:        Mood and Affect: Mood normal.        Behavior: Behavior normal.        Thought Content: Thought content normal.        Judgment: Judgment normal.      Results for orders placed or performed during the hospital encounter of 09/11/24  Resp panel by RT-PCR (RSV, Flu A&B, Covid) Anterior Nasal Swab   Collection Time: 09/11/24  6:30 PM   Specimen: Anterior Nasal Swab  Result Value Ref Range   SARS  Coronavirus 2 by RT PCR NEGATIVE NEGATIVE   Influenza A by PCR NEGATIVE NEGATIVE   Influenza B by PCR NEGATIVE NEGATIVE   Resp Syncytial Virus by PCR NEGATIVE NEGATIVE  CBC with Differential   Collection Time: 09/11/24  6:50 PM  Result Value Ref Range   WBC 11.0 (H) 4.0 - 10.5 K/uL   RBC 4.66 4.22 - 5.81 MIL/uL   Hemoglobin 13.4 13.0 - 17.0 g/dL   HCT 60.2 60.9 - 47.9 %   MCV 85.2 80.0 - 100.0 fL   MCH 28.8 26.0 - 34.0 pg   MCHC 33.8 30.0 - 36.0 g/dL   RDW 86.8 88.4 - 84.4 %   Platelets 421 (H) 150 - 400 K/uL   nRBC 0.0 0.0 - 0.2 %   Neutrophils Relative % 72 %   Neutro Abs 8.0 (H) 1.7 -  7.7 K/uL   Lymphocytes Relative 18 %   Lymphs Abs 2.0 0.7 - 4.0 K/uL   Monocytes Relative 8 %   Monocytes Absolute 0.9 0.1 - 1.0 K/uL   Eosinophils Relative 1 %   Eosinophils Absolute 0.1 0.0 - 0.5 K/uL   Basophils Relative 0 %   Basophils Absolute 0.0 0.0 - 0.1 K/uL   Immature Granulocytes 1 %   Abs Immature Granulocytes 0.06 0.00 - 0.07 K/uL  Comprehensive metabolic panel   Collection Time: 09/11/24  6:50 PM  Result Value Ref Range   Sodium 134 (L) 135 - 145 mmol/L   Potassium 4.5 3.5 - 5.1 mmol/L   Chloride 99 98 - 111 mmol/L   CO2 19 (L) 22 - 32 mmol/L   Glucose, Bld 174 (H) 70 - 99 mg/dL   BUN 30 (H) 8 - 23 mg/dL   Creatinine, Ser 8.71 (H) 0.61 - 1.24 mg/dL   Calcium  9.3 8.9 - 10.3 mg/dL   Total Protein 7.1 6.5 - 8.1 g/dL   Albumin  3.9 3.5 - 5.0 g/dL   AST 835 (H) 15 - 41 U/L   ALT 303 (H) 0 - 44 U/L   Alkaline Phosphatase 95 38 - 126 U/L   Total Bilirubin 0.6 0.0 - 1.2 mg/dL   GFR, Estimated >39 >39 mL/min   Anion gap 16 (H) 5 - 15  Pro Brain natriuretic peptide   Collection Time: 09/11/24  6:50 PM  Result Value Ref Range   Pro Brain Natriuretic Peptide 2,578.0 (H) <300.0 pg/mL  Troponin T, High Sensitivity   Collection Time: 09/11/24  6:50 PM  Result Value Ref Range   Troponin T High Sensitivity 128 (HH) 0 - 19 ng/L  Troponin T, High Sensitivity   Collection Time: 09/11/24  8:36 PM  Result Value Ref Range   Troponin T High Sensitivity 129 (HH) 0 - 19 ng/L  Basic metabolic panel   Collection Time: 09/12/24  4:16 AM  Result Value Ref Range   Sodium 133 (L) 135 - 145 mmol/L   Potassium 4.0 3.5 - 5.1 mmol/L   Chloride 97 (L) 98 - 111 mmol/L   CO2 26 22 - 32 mmol/L   Glucose, Bld 217 (H) 70 - 99 mg/dL   BUN 29 (H) 8 - 23 mg/dL   Creatinine, Ser 8.65 (H) 0.61 - 1.24 mg/dL   Calcium  9.1 8.9 - 10.3 mg/dL   GFR, Estimated 58 (L) >60 mL/min   Anion gap 11 5 - 15  CBC   Collection Time: 09/12/24  4:16 AM  Result Value Ref Range   WBC 12.5 (H) 4.0 - 10.5 K/uL  RBC 4.70 4.22 - 5.81 MIL/uL   Hemoglobin 13.6 13.0 - 17.0 g/dL   HCT 60.1 60.9 - 47.9 %   MCV 84.7 80.0 - 100.0 fL   MCH 28.9 26.0 - 34.0 pg   MCHC 34.2 30.0 - 36.0 g/dL   RDW 86.7 88.4 - 84.4 %   Platelets 458 (H) 150 - 400 K/uL   nRBC 0.0 0.0 - 0.2 %  Hepatic function panel   Collection Time: 09/12/24  4:16 AM  Result Value Ref Range   Total Protein 6.9 6.5 - 8.1 g/dL   Albumin  3.9 3.5 - 5.0 g/dL   AST 873 (H) 15 - 41 U/L   ALT 273 (H) 0 - 44 U/L   Alkaline Phosphatase 96 38 - 126 U/L   Total Bilirubin 0.8 0.0 - 1.2 mg/dL   Bilirubin, Direct 0.5 (H) 0.0 - 0.2 mg/dL   Indirect Bilirubin 0.4 0.3 - 0.9 mg/dL  CBG monitoring, ED   Collection Time: 09/12/24  8:20 AM  Result Value Ref Range   Glucose-Capillary 185 (H) 70 - 99 mg/dL  ECHOCARDIOGRAM COMPLETE   Collection Time: 09/12/24 11:00 AM  Result Value Ref Range   BP 116/82 mmHg   Single Plane A2C EF 62.0 %   Single Plane A4C EF 66.9 %   Calc EF 64.7 %   Area-P 1/2 3.45 cm2   S' Lateral 1.90 cm   Est EF 55 - 60%   CBG monitoring, ED   Collection Time: 09/12/24 12:58 PM  Result Value Ref Range   Glucose-Capillary 208 (H) 70 - 99 mg/dL  Glucose, capillary   Collection Time: 09/12/24  4:19 PM  Result Value Ref Range   Glucose-Capillary 159 (H) 70 - 99 mg/dL  Glucose, capillary   Collection Time: 09/12/24  8:22 PM  Result Value Ref Range   Glucose-Capillary 199 (H) 70 - 99 mg/dL   Comment 1 Notify RN    Comment 2 Document in Chart   Hepatic function panel   Collection Time: 09/13/24  5:06 AM  Result Value Ref Range   Total Protein 6.7 6.5 - 8.1 g/dL   Albumin  3.9 3.5 - 5.0 g/dL   AST 87 (H) 15 - 41 U/L   ALT 218 (H) 0 - 44 U/L   Alkaline Phosphatase 96 38 - 126 U/L   Total Bilirubin 1.1 0.0 - 1.2 mg/dL   Bilirubin, Direct 0.7 (H) 0.0 - 0.2 mg/dL   Indirect Bilirubin 0.4 0.3 - 0.9 mg/dL  Basic metabolic panel with GFR   Collection Time: 09/13/24  5:06 AM  Result Value Ref Range   Sodium 134 (L) 135 - 145  mmol/L   Potassium 3.7 3.5 - 5.1 mmol/L   Chloride 97 (L) 98 - 111 mmol/L   CO2 25 22 - 32 mmol/L   Glucose, Bld 171 (H) 70 - 99 mg/dL   BUN 35 (H) 8 - 23 mg/dL   Creatinine, Ser 8.50 (H) 0.61 - 1.24 mg/dL   Calcium  9.1 8.9 - 10.3 mg/dL   GFR, Estimated 51 (L) >60 mL/min   Anion gap 13 5 - 15  Magnesium    Collection Time: 09/13/24  5:06 AM  Result Value Ref Range   Magnesium  2.4 1.7 - 2.4 mg/dL  CBC   Collection Time: 09/13/24  5:06 AM  Result Value Ref Range   WBC 11.0 (H) 4.0 - 10.5 K/uL   RBC 4.52 4.22 - 5.81 MIL/uL   Hemoglobin 13.0 13.0 - 17.0 g/dL   HCT  38.7 (L) 39.0 - 52.0 %   MCV 85.6 80.0 - 100.0 fL   MCH 28.8 26.0 - 34.0 pg   MCHC 33.6 30.0 - 36.0 g/dL   RDW 86.6 88.4 - 84.4 %   Platelets 449 (H) 150 - 400 K/uL   nRBC 0.0 0.0 - 0.2 %  Glucose, capillary   Collection Time: 09/13/24  7:30 AM  Result Value Ref Range   Glucose-Capillary 163 (H) 70 - 99 mg/dL   Comment 1 Document in Chart   Glucose, capillary   Collection Time: 09/13/24 11:35 AM  Result Value Ref Range   Glucose-Capillary 203 (H) 70 - 99 mg/dL   Comment 1 Document in Chart        Pertinent labs & imaging results that were available during my care of the patient were reviewed by me and considered in my medical decision making.  Assessment & Plan:  Tyrez was seen today for hospitalization follow-up.  Diagnoses and all orders for this visit:  NSTEMI (non-ST elevated myocardial infarction) (HCC) -     CMP14+EGFR -     CBC with Differential/Platelet  S/P CABG x 4 -     CMP14+EGFR -     CBC with Differential/Platelet  Acute on chronic diastolic congestive heart failure (HCC) -     CMP14+EGFR -     CBC with Differential/Platelet  Type 2 diabetes mellitus with other specified complication, without long-term current use of insulin  (HCC) -     CMP14+EGFR -     CBC with Differential/Platelet  Hospital discharge follow-up -     CMP14+EGFR -     CBC with Differential/Platelet  Today's visit  was for Transitional Care Management.  The patient was discharged from [facility] on 09/13/2024 and [facility] on 09/05/2024 with a primary diagnosis of CHF, NSTEMI, CABG x 4.   Contact with the patient and/or caregiver, by a clinical staff member, was made on 09/07/2024 and was documented as a telephone encounter within the EMR.  Through chart review and discussion with the patient I have determined that management of their condition is of high complexity.         Acute on chronic diastolic congestive heart failure Recent exacerbation with fluid retention in the lower lobe of the left lung. Echocardiogram shows thickened heart muscle with reduced pliability. Symptoms include shortness of breath and fatigue. No peripheral edema. Managed with beta blockers and diuretics to prevent further cardiac remodeling and fluid overload. - Monitor weight daily; report any increase of 3 pounds overnight. - Continue beta blockers to prevent cardiac remodeling. - Continue diuretics to manage fluid retention. - Follow up with cardiology for further management.  Recent NSTEMI, status post CABG x4 Recent NSTEMI with subsequent CABG x4. Post-operative course complicated by fluid retention and heart failure symptoms. Dual antiplatelet therapy with Plavix  and aspirin  initiated. Cardiac function compromised with thickened heart muscle. Cardiac rehab to be discussed with cardiology. - Continue dual antiplatelet therapy with Plavix  and aspirin  for 6-12 months post-CABG. - Ordered CBC to monitor for complications of dual antiplatelet therapy. - Ordered CMP to assess liver function and blood sugar levels. - Will discuss cardiac rehabilitation plan with cardiology.  Type 2 diabetes mellitus with complications Type 2 diabetes with complications, including potential organ damage. Current regimen includes Jardiance  and metformin . Discussion of potential switch to GLP-1 receptor agonists (Ozempic or Mounjaro) for better  glycemic control and cardiovascular benefits. Concerns about yeast infections with Jardiance . GLP-1 receptor agonists may offer additional benefits  for heart and kidney health. - Continue Jardiance  and metformin . - Discuss potential switch to Ozempic or Mounjaro with cardiology. - Monitor for signs of yeast infections due to Jardiance . - Provided information on GLP-1 receptor agonists for review.  Hospital discharge follow-up Recent discharge from hospital following NSTEMI and CABG. Home recovery with some residual fatigue and shortness of breath. Incision healing well.  - Continue to monitor for any new or worsening symptoms. - Ensure adherence to medication regimen. - Follow up with cardiology and other specialists as scheduled.          Continue all other maintenance medications.  Follow up plan: Return if symptoms worsen or fail to improve.   Continue healthy lifestyle choices, including diet (rich in fruits, vegetables, and lean proteins, and low in salt and simple carbohydrates) and exercise (at least 30 minutes of moderate physical activity daily).  Educational handout given for semaglutide, tirzepatide, heart failure  The above assessment and management plan was discussed with the patient. The patient verbalized understanding of and has agreed to the management plan. Patient is aware to call the clinic if they develop any new symptoms or if symptoms persist or worsen. Patient is aware when to return to the clinic for a follow-up visit. Patient educated on when it is appropriate to go to the emergency department.   Rosaline Bruns, FNP-C [facility] [phone]

## 2024-09-14 NOTE — Transitions of Care (Post Inpatient/ED Visit) (Signed)
   09/14/2024  Name: Bryan Shepard [name] MRN: [id number] DOB: Jun 20, 1956  Today's TOC FU Call Status: Today's TOC FU Call Status:: Unsuccessful Call (1st Attempt) Unsuccessful Call (1st Attempt) Date: 09/14/24  Attempted to reach the patient regarding the most recent Inpatient/ED visit.  Follow Up Plan: Additional outreach attempts will be made to reach the patient to complete the Transitions of Care (Post Inpatient/ED visit) call.   Shona Prow RN, CCM [place]  VBCI-Population Health RN Care Manager [phone]

## 2024-09-15 ENCOUNTER — Inpatient Hospital Stay
Admission: RE | Admit: 2024-09-15 | Discharge: 2024-09-15 | Attending: Cardiovascular Disease | Admitting: Cardiovascular Disease

## 2024-09-15 ENCOUNTER — Ambulatory Visit: Payer: Self-pay

## 2024-09-15 ENCOUNTER — Ambulatory Visit: Payer: Self-pay | Admitting: Family Medicine

## 2024-09-15 VITALS — BP 109/73 | HR 88 | Resp 20 | Ht 67.0 in | Wt 183.0 lb

## 2024-09-15 DIAGNOSIS — Z951 Presence of aortocoronary bypass graft: Secondary | ICD-10-CM

## 2024-09-15 DIAGNOSIS — R7989 Other specified abnormal findings of blood chemistry: Secondary | ICD-10-CM

## 2024-09-15 DIAGNOSIS — E1122 Type 2 diabetes mellitus with diabetic chronic kidney disease: Secondary | ICD-10-CM

## 2024-09-15 LAB — CBC WITH DIFFERENTIAL/PLATELET
Basophils Absolute: 0.1 x10E3/uL (ref 0.0–0.2)
Basos: 1 %
EOS (ABSOLUTE): 0.1 x10E3/uL (ref 0.0–0.4)
Eos: 1 %
Hematocrit: 46.6 % (ref 37.5–51.0)
Hemoglobin: 15 g/dL (ref 13.0–17.7)
Immature Grans (Abs): 0 x10E3/uL (ref 0.0–0.1)
Immature Granulocytes: 0 %
Lymphocytes Absolute: 2.4 x10E3/uL (ref 0.7–3.1)
Lymphs: 16 %
MCH: 28.4 pg (ref 26.6–33.0)
MCHC: 32.2 g/dL (ref 31.5–35.7)
MCV: 88 fL (ref 79–97)
Monocytes Absolute: 1.1 x10E3/uL — ABNORMAL HIGH (ref 0.1–0.9)
Monocytes: 7 %
Neutrophils Absolute: 11.2 x10E3/uL — ABNORMAL HIGH (ref 1.4–7.0)
Neutrophils: 75 %
Platelets: 636 x10E3/uL — ABNORMAL HIGH (ref 150–450)
RBC: 5.28 x10E6/uL (ref 4.14–5.80)
RDW: 13.1 % (ref 11.6–15.4)
WBC: 15 x10E3/uL — ABNORMAL HIGH (ref 3.4–10.8)

## 2024-09-15 LAB — CMP14+EGFR
ALT: 175 IU/L — AB (ref 0–44)
AST: 66 IU/L — AB (ref 0–40)
Albumin: 4.6 g/dL (ref 3.9–4.9)
Alkaline Phosphatase: 121 IU/L (ref 47–123)
BUN/Creatinine Ratio: 23 (ref 10–24)
BUN: 43 mg/dL — AB (ref 8–27)
Bilirubin Total: 0.8 mg/dL (ref 0.0–1.2)
CO2: 20 mmol/L (ref 20–29)
Calcium: 10 mg/dL (ref 8.6–10.2)
Chloride: 94 mmol/L — AB (ref 96–106)
Creatinine, Ser: 1.89 mg/dL — AB (ref 0.76–1.27)
Globulin, Total: 3.2 g/dL (ref 1.5–4.5)
Glucose: 233 mg/dL — AB (ref 70–99)
Potassium: 5.5 mmol/L — AB (ref 3.5–5.2)
Sodium: 133 mmol/L — AB (ref 134–144)
Total Protein: 7.8 g/dL (ref 6.0–8.5)
eGFR: 38 mL/min/1.73 — AB (ref >59)

## 2024-09-15 NOTE — Telephone Encounter (Signed)
 Patient aware and verbalizes understanding.  Appointment scheduled and lab orders placed.

## 2024-09-15 NOTE — Patient Instructions (Addendum)
-  Follow up on 09/27/2024 with Dr. Daniel as previously scheduled

## 2024-09-15 NOTE — Progress Notes (Signed)
 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 Daniel.  The patient's early postoperative recovery while in the hospital was notable for having a trace left pleural effusion after chest tube removal.  He was given lasix  for diuresis. He remained in normal sinus rhythm. He was stable for discharge home on 09/05/2024.  Since hospital discharge the patient reports that he has been doing ok.  He re-admitted on 09/11/2024 due to shortness of breath. He was treated with IV lasix  which helped with symptoms. He was started on torsemide  20 mg daily and empagliflozin  10 mg daily. Echocardiogram showed EF 55-60%, LV/RV function, moderate LVH, G1 DD, mildly dilated LA, trivial MV regurgitation, AV sclerosis without AS.  After discharge he still feels very winded with light exertion. This has been slow to improve. He does have home physical therapy scheduled.  He has been unable to be active due to symptoms of shortness of breath on exertion.  His incision sites have been healing appropriately.  He has not needed tramadol  for pain. He has been keeping blood pressure, pulse and weight log. After review no episodes of hypotension and he is below preoperative weight. He denies chest pain, shortness of breath at rest and lower leg swelling.   Allergies as of 09/15/2024   No Known Allergies      Medication List        Accurate as of September 15, 2024  4:56 PM. If you have any questions, ask your nurse or doctor.          Accu-Chek FastClix Lancets Misc CHECK BS DAILY DX E11.9   Accu-Chek Guide Me w/Device Kit CHECK BS DAILY DX E11.9   acetaminophen  500 MG tablet Commonly known as: TYLENOL  Take 1-2 tablets (500-1,000 mg total) by mouth every 6 (six) hours as needed.    albuterol  108 (90 Base) MCG/ACT inhaler Commonly known as: VENTOLIN  HFA Inhale 2 puffs into the lungs every 6 (six) hours as needed for shortness of breath.   aspirin  81 MG chewable tablet Chew 81 mg by mouth daily.   clopidogrel  75 MG tablet Commonly known as: Plavix  Take 1 tablet (75 mg total) by mouth daily.   Dexcom G7 Sensor Misc 1 Device by Does not apply route every 14 (fourteen) days.   empagliflozin  10 MG Tabs tablet Commonly known as: JARDIANCE  Take 1 tablet (10 mg total) by mouth daily.   FREESTYLE LITE test strip Generic drug: glucose blood Check BS daily Dx E11.9   metFORMIN  500 MG tablet Commonly known as: GLUCOPHAGE  Take 1 tablet (500 mg total) by mouth 2 (two) times daily with a meal.   methocarbamol  500 MG tablet Commonly known as: ROBAXIN  Take 1 tablet (500 mg total) by mouth every 8 (eight) hours as needed for muscle spasms.   metoprolol  tartrate 25 MG tablet Commonly known as: LOPRESSOR  Take 1 tablet (25 mg total) by mouth 2 (two) times daily.   potassium chloride  SA 20 MEQ tablet Commonly known as: KLOR-CON  M Take 1 tablet (20 mEq total) by mouth daily.   rosuvastatin  20 MG tablet Commonly known as: CRESTOR  Take 1 tablet (20 mg total) by mouth daily  at 6 PM.   torsemide  20 MG tablet Commonly known as: DEMADEX  Take 1 tablet (20 mg total) by mouth daily.   traMADol  50 MG tablet Commonly known as: ULTRAM  Take 1 tablet (50 mg total) by mouth every 4 (four) hours as needed for moderate pain (pain score 4-6).         ROS Review of Systems  Constitutional:  Positive for malaise/fatigue.  Respiratory:  Positive for shortness of breath. Negative for cough.        On exertion  Cardiovascular:  Negative for chest pain, palpitations and leg swelling.      BP 109/73   Pulse 88   Resp 20   Ht 5' 7 (1.702 m)   Wt 183 lb (83 kg)   SpO2 100% Comment: RA  BMI 28.66 kg/m    Physical Exam Constitutional:      Appearance: Normal  appearance.  HENT:     Head: Normocephalic and atraumatic.  Cardiovascular:     Rate and Rhythm: Normal rate and regular rhythm.     Heart sounds: Normal heart sounds, S1 normal and S2 normal.  Pulmonary:     Effort: Pulmonary effort is normal.     Breath sounds: Normal breath sounds.  Skin:    General: Skin is warm and dry.      Neurological:     General: No focal deficit present.     Mental Status: He is alert and oriented to person, place, and time.      Imaging: EXAM: 2 VIEW(S) XRAY OF THE CHEST 09/15/2024 03:18:00 PM   COMPARISON: 4 days ago status post coronary artery bypass graft.   CLINICAL HISTORY: cabg   FINDINGS:   LUNGS AND PLEURA: No focal pulmonary opacity. No pleural effusion. No pneumothorax.   HEART AND MEDIASTINUM: No acute abnormality of the cardiac and mediastinal silhouettes.   BONES AND SOFT TISSUES: No acute osseous abnormality.   IMPRESSION: 1. No acute cardiopulmonary process.   Electronically signed by: Lynwood Seip MD 09/15/2024 03:39 PM EST RP Workstation: [id number]   Assessment/Plan:  S/P CABG x 4  -We reviewed today's chest x ray. No left sided pleural effusion as seen on previous xray. He should start to use his incentive spirometer to help encourage deep breathing.  -He has slowly progressed from surgery. Discussed that he should attempt interval walking.  Try 3 walks a day starting a 5 minutes each walk and slowly increase as able -He has home physical therapy starting to help with deconditioning after surgery -He has had some improvement of shortness of breath on exertion with torsemide  and jardiance . He is to continue with these as prescribed.  -Discussed continuance of sternal precautions until a full 6 weeks from surgery, he should not lift over 10 pounds until a full 3 months from surgery -He has follow up with cardiology on 10/01/2024  -Follow up on 09/27/2024 with Dr. Daniel. Will discuss driving and clearance for cardiac  rehab  Manuelita CHRISTELLA Rough, PA-C 4:56 PM 09/15/24

## 2024-09-21 ENCOUNTER — Telehealth: Payer: Self-pay | Admitting: Emergency Medicine

## 2024-09-21 ENCOUNTER — Telehealth: Payer: Self-pay | Admitting: Family Medicine

## 2024-09-21 ENCOUNTER — Encounter: Payer: Self-pay | Admitting: Student

## 2024-09-21 NOTE — Progress Notes (Unsigned)
 Cardiology Office Note   Date:  09/22/2024  ID:  [name], DOB 1956/08/23, MRN [id number] PCP:  Severa Rock HERO, FNP  Cardiologist:  Diannah SHAUNNA Maywood, MD  Electrophysiologist:  None   Chief Complaint: Fatigue, lightheadedness, palpitations  History of Present Illness: .   Lattie Cervi. is a 68 y.o. 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  Patient previously followed by [facility] cardiology in 08/2015 following CVA, underwent ETT and TEE at that time which was reassuring.  Patient had ILR placed.  Patient seen by heart care and CT surgery during hospitalization from 11/23-30/25, patient with NSTEMI.  EKG showed normal sinus rhythm, heart rate 81 with PACs and slight ST depression in lateral leads. HsTN 100>>172>>284>>303>>409. 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  He was discharged home on 09/05/2024 in stable condition.  On 09/11/24 he presented to [facility] ED for acute onset of worsening DOE. proBNP 2578, TN 128 > 129, K 4.5 > 3.7, Mg 2.4, CR 1.28 > 1.49, AST 164 > 87, ALT 303 > 218, elevated WBC 11 with elevated neutrophils 8, negative viral respiratory panel, elevated platelets 421K. CXR without any acute findings. Chest CTA with no PE but showed small left pleural effusion and left lower lobe atelectasis. ECHO 09/12/2024: EF 55 to 60%, normal LV/RV function, moderate LVH, G1 DD, mildly dilated LA, trivial MV regurgitation, AV sclerosis without AS. Patient reported worsening shortness of breath since the Thursday prior to admission associated with standing, walking to restroom and walking up the stairs in his house. He did not notice increase in urine output with home lasix . Patient was started on Jardiance  10 mg daily and Torsemide  20 mg daily.  Patient was seen  by CT surgery team on 09/15/24, recommended to continue using incentive spirometer. 2 view chest xray without acute cardiopulmonary process.   Today he presents regarding fatigue, lightheadedness and palpitations.  Patient reports that in the last week he has had progressively worsening lightheadedness and fatigue.  Patient notes that he will get up and walk to the bathroom and feels significant lightheaded fatigued and short of breath.  With position changes will become dizzy and lightheaded.  He reports that when he initially was discharged from the hospital he was doing well however has progressively worsened.  He denies any chest pain, lower extremity edema, orthopnea or PND.  He notes that after getting up and walking around it feels as though his heart is beating hard or will have skipped beats, his wife notes that she will listen to his heart and it sounds irregular.  Patient's blood pressures at home have been declining since recent admission.  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  They note that he has not been consistently using his CPAP.  Labwork independently reviewed: Patient with lab work at the [facility] on 09/17/2024: Potassium 3.9, sodium 136, creatinine 1.71, eGFR 43, AST 55, ALT 133. ROS: .   Today he denies chest pain, lower extremity edema, melena, hematuria, hemoptysis, diaphoresis, syncope, orthopnea, and PND.  All other systems are reviewed and otherwise negative. Studies Reviewed: SABRA   EKG:  EKG is ordered today, personally reviewed, demonstrating  EKG Interpretation Date/Time:  Wednesday September 22 2024 10:07:23 EST Ventricular Rate:  75 PR Interval:  156 QRS Duration:  114 QT Interval:  376 QTC Calculation: 419 R Axis:   75  Text Interpretation: Normal sinus rhythm  T wave inversion inferior leads T wave inversion lateral leads Similar to EKG post CABG Confirmed by  Davita Sublett [phone]) on 09/22/2024 12:26:37 PM   CV Studies: Cardiac studies reviewed are outlined and summarized above. Otherwise please see EMR for full report. Cardiac Studies & Procedures   ______________________________________________________________________________________________ CARDIAC CATHETERIZATION  CARDIAC CATHETERIZATION 08/30/2024  Conclusion   Prox RCA lesion is 40% stenosed.   Dist RCA lesion is 100% stenosed.   Ramus lesion is 95% stenosed with 90% stenosed side branch in Lat Ramus.   1st Mrg lesion is 95% stenosed.   RPAV lesion is 100% stenosed.   RPDA lesion is 100% stenosed.   Prox LAD lesion is 90% stenosed.   Mid LAD lesion is 80% stenosed.   LV end diastolic pressure is mildly elevated.   There is no aortic valve stenosis.   Anticipated discharge date to be determined.   Continue aspirin  81 mg daily.  Defer P2Y12 inhibitor pending cardiac surgery consultation.  Resume heparin  infusion 2 hours after TR band removal.  Conclusions: Severe three-vessel coronary artery disease, as detailed below, including sequential 90% and 80% proximal/mid LAD stenoses, 90-95% stenoses of bifurcating ramus intermedius, 95% stenosis of OM1, and chronic total occlusion of distal RCA. Mildly elevated left ventricular filling pressure (LVEDP 20 mmHg).  Recommendations: Inpatient cardiac surgery consultation for CABG. Resume heparin  infusion 2 hours after TR band has been removed. Aggressive secondary prevention of coronary artery disease. Transition from nitroglycerin  paste to isosorbide  mononitrate for antianginal therapy.  Lonni Hanson, MD [facility]  Findings Coronary Findings Diagnostic  Dominance: Right  Left Main Vessel is large. Vessel is angiographically normal.  Left Anterior Descending Vessel is moderate in size. Prox LAD lesion is 90% stenosed. The lesion is focal and eccentric. Mid LAD lesion is 80% stenosed.  Ramus Intermedius Vessel is moderate in  size. Ramus lesion is 95% stenosed with 90% stenosed side branch in Lat Ramus.  Left Circumflex Vessel is moderate in size.  First Obtuse Marginal Branch 1st Mrg lesion is 95% stenosed.  Second Obtuse Marginal Branch Vessel is small in size.  Right Coronary Artery Vessel is large. Prox RCA lesion is 40% stenosed. Dist RCA lesion is 100% stenosed. The lesion is chronically occluded.  Right Posterior Descending Artery Collaterals RPDA filled by collaterals from 2nd Sept.  RPDA lesion is 100% stenosed.  Right Posterior Atrioventricular Artery Collaterals RPAV filled by collaterals from Dist Cx.  RPAV lesion is 100% stenosed.  First Right Posterolateral Branch Vessel is small in size.  Second Right Posterolateral Branch Vessel is small in size.  Third Right Posterolateral Branch Vessel is small in size.  Intervention  No interventions have been documented.     ECHOCARDIOGRAM  ECHOCARDIOGRAM COMPLETE 09/12/2024  Narrative ECHOCARDIOGRAM REPORT    Patient Name:   Bryan Shepard. Date of Exam: 09/12/2024 Medical Rec #:  [id number]         Height:       67.0 in Accession #:    [id number]        Weight:       197.4 lb Date of Birth:  12-Mar-1956          BSA:          2.011 m Patient Age:  68 years          BP:           118/71 mmHg Patient Gender: M                 HR:           86 bpm. Exam Location:  [facility]  Procedure: 2D Echo, Cardiac Doppler, Color Doppler and Intracardiac Opacification Agent (Both Spectral and Color Flow Doppler were utilized during procedure).  Indications:    CHF I50.31  History:        Patient has prior history of Echocardiogram examinations, most recent 08/30/2024. Stroke; Risk Factors:Diabetes.  Sonographer:    Tinnie Gosling RDCS Referring Phys: [id number] JAN A MANSY  IMPRESSIONS   1. Left ventricular ejection fraction, by estimation, is 55 to 60%. The left ventricle has normal function. The left ventricle has no regional  wall motion abnormalities. There is moderate left ventricular hypertrophy. Left ventricular diastolic parameters are consistent with Grade I diastolic dysfunction (impaired relaxation). 2. Right ventricular systolic function is normal. The right ventricular size is normal. 3. Left atrial size was mildly dilated. 4. The mitral valve is abnormal. Trivial mitral valve regurgitation. No evidence of mitral stenosis. 5. The aortic valve is tricuspid. There is moderate calcification of the aortic valve. There is moderate thickening of the aortic valve. Aortic valve regurgitation is not visualized. Aortic valve sclerosis is present, with no evidence of aortic valve stenosis. 6. The inferior vena cava is normal in size with greater than 50% respiratory variability, suggesting right atrial pressure of 3 mmHg.  FINDINGS Left Ventricle: Left ventricular ejection fraction, by estimation, is 55 to 60%. The left ventricle has normal function. The left ventricle has no regional wall motion abnormalities. Strain was performed and the global longitudinal strain is indeterminate. The left ventricular internal cavity size was normal in size. There is moderate left ventricular hypertrophy. Left ventricular diastolic parameters are consistent with Grade I diastolic dysfunction (impaired relaxation).  Right Ventricle: The right ventricular size is normal. No increase in right ventricular wall thickness. Right ventricular systolic function is normal.  Left Atrium: Left atrial size was mildly dilated.  Right Atrium: Right atrial size was normal in size.  Pericardium: There is no evidence of pericardial effusion.  Mitral Valve: The mitral valve is abnormal. There is mild thickening of the mitral valve leaflet(s). There is mild calcification of the mitral valve leaflet(s). Mild mitral annular calcification. Trivial mitral valve regurgitation. No evidence of mitral valve stenosis.  Tricuspid Valve: The tricuspid valve  is normal in structure. Tricuspid valve regurgitation is not demonstrated. No evidence of tricuspid stenosis.  Aortic Valve: The aortic valve is tricuspid. There is moderate calcification of the aortic valve. There is moderate thickening of the aortic valve. Aortic valve regurgitation is not visualized. Aortic valve sclerosis is present, with no evidence of aortic valve stenosis.  Pulmonic Valve: The pulmonic valve was normal in structure. Pulmonic valve regurgitation is trivial. No evidence of pulmonic stenosis.  Aorta: The aortic root is normal in size and structure.  Venous: The inferior vena cava is normal in size with greater than 50% respiratory variability, suggesting right atrial pressure of 3 mmHg.  IAS/Shunts: No atrial level shunt detected by color flow Doppler.  Additional Comments: 3D was performed not requiring image post processing on an independent workstation and was indeterminate.   LEFT VENTRICLE PLAX 2D LVIDd:         2.30 cm     Diastology  LVIDs:         1.90 cm     LV e' medial:    5.22 cm/s LV PW:         1.40 cm     LV E/e' medial:  12.5 LV IVS:        1.40 cm     LV e' lateral:   11.40 cm/s LVOT diam:     2.20 cm     LV E/e' lateral: 5.7 LV SV:         45 LV SV Index:   22 LVOT Area:     3.80 cm LV IVRT:       114 msec  LV Volumes (MOD) LV vol d, MOD A2C: 29.7 ml LV vol d, MOD A4C: 35.7 ml LV vol s, MOD A2C: 11.3 ml LV vol s, MOD A4C: 11.8 ml LV SV MOD A2C:     18.4 ml LV SV MOD A4C:     35.7 ml LV SV MOD BP:      21.9 ml  RIGHT VENTRICLE            IVC RV S prime:     5.74 cm/s  IVC diam: 0.80 cm  LEFT ATRIUM             Index        RIGHT ATRIUM           Index LA diam:        3.70 cm 1.84 cm/m   RA Area:     11.10 cm LA Vol (A2C):   20.2 ml 10.04 ml/m  RA Volume:   20.10 ml  9.99 ml/m LA Vol (A4C):   23.7 ml 11.78 ml/m LA Biplane Vol: 21.7 ml 10.79 ml/m AORTIC VALVE LVOT Vmax:   76.50 cm/s LVOT Vmean:  51.500 cm/s LVOT VTI:    0.118  m  AORTA Ao Root diam: 3.10 cm Ao Asc diam:  3.60 cm  MITRAL VALVE MV Area (PHT): 3.45 cm    SHUNTS MV Decel Time: 220 msec    Systemic VTI:  0.12 m MV E velocity: 65.50 cm/s  Systemic Diam: 2.20 cm MV A velocity: 89.20 cm/s MV E/A ratio:  0.73  Maude Emmer MD Electronically signed by Maude Emmer MD Signature Date/Time: 09/12/2024/12:20:30 PM    Final   TEE  ECHO INTRAOPERATIVE TEE 09/01/2024  Narrative *INTRAOPERATIVE TRANSESOPHAGEAL REPORT *    Patient Name:   Agamjot Kilgallon. Date of Exam: 09/01/2024 Medical Rec #:  [id number]         Height:       67.0 in Accession #:    [id number]        Weight:       199.3 lb Date of Birth:  1956-04-19          BSA:          2.02 m Patient Age:    68 years          BP:           111/59 mmHg Patient Gender: M                 HR:           76 bpm. Exam Location:  Anesthesiology  Transesophogeal exam was perform intraoperatively during surgical procedure. Patient was closely monitored under general anesthesia during the entirety of examination.  Indications:     CAD Performing Phys: [id number] CON RAMAN SU  Complications:  No known complications during this procedure. POST-OP IMPRESSIONS _ Left Ventricle: The left ventricle is unchanged from pre-bypass. The wall motion is Improved RWMA seen in preop exam. _ Right Ventricle: mildly reduced function. The cavity was normal. _ Left Atrial Appendage: The left atrial appendage appears unchanged from pre-bypass. _ Aortic Valve: The aortic valve appears unchanged from pre-bypass. _ Mitral Valve: The mitral valve appears unchanged from pre-bypass. _ Tricuspid Valve: The tricuspid valve appears unchanged from pre-bypass. _ Comments: Limited images post-op.  PRE-OP FINDINGS Left Ventricle: The left ventricle has normal systolic function, with an ejection fraction of 55-60%. The cavity size was normal. There is mild concentric left ventricular hypertrophy.   Right Ventricle: The right  ventricle has normal systolic function. The cavity was normal. There is no increase in right ventricular wall thickness.  Left Atrium: Left atrial size was normal in size. No left atrial/left atrial appendage thrombus was detected.  Right Atrium: Right atrial size was normal in size.  Interatrial Septum: No atrial level shunt detected by color flow Doppler.  Pericardium: There is no evidence of pericardial effusion.  Mitral Valve: The mitral valve is normal in structure. Mitral valve regurgitation is moderate by color flow Doppler. The MR jet is centrally-directed. There is no evidence of mitral valve vegetation.  Tricuspid Valve: The tricuspid valve was normal in structure. Tricuspid valve regurgitation is trivial by color flow Doppler.  Aortic Valve: The aortic valve is tricuspid Aortic valve regurgitation was not visualized by color flow Doppler. There is no stenosis of the aortic valve.   Pulmonic Valve: The pulmonic valve was normal in structure. Pulmonic valve regurgitation is trivial by color flow Doppler.    Norleen Pope MD Electronically signed by Norleen Pope MD Signature Date/Time: 09/02/2024/9:28:33 AM    Final        ______________________________________________________________________________________________       Current Reported Medications:.    Active Medications[1]  Physical Exam:    VS:  BP 94/66   Pulse 75   Ht 5' 7 (1.702 m)   Wt 178 lb 3.2 oz (80.8 kg)   SpO2 99%   BMI 27.91 kg/m    Wt Readings from Last 3 Encounters:  09/22/24 178 lb 3.2 oz (80.8 kg)  09/15/24 183 lb (83 kg)  09/14/24 183 lb 12.8 oz (83.4 kg)    GEN: Well nourished, well developed in no acute distress NECK: No JVD; No carotid bruits CARDIAC: RRR, no murmurs, rubs, gallops RESPIRATORY:  Clear to auscultation without rales, wheezing or rhonchi  ABDOMEN: Soft, non-tender, non-distended EXTREMITIES:  No edema; No acute deformity     Asessement and Plan:.    CAD:  NSTMEI in 08/2024. s/p CABG x4 with SVG-PDA, SVG-OM, SVG-ramus, LIMA-LAD.  Today he reports increased fatigue, dizziness, lightheadedness and shortness of breath that has been progressive for the last week.  Patient's blood pressure is low today in clinic, question if he has been overdiuresed given decline in renal function and low blood pressure.  Weights are trending down.  Patient denies any shortness of breath at rest or when lying flat, notes that he is very fatigued, lightheaded and short of breath with increased exertion.  Will decrease patient's metoprolol  to 12.5 mg twice daily and transition torsemide  to as needed for increased shortness of breath, weight gain to 3 pounds overnight or 5 pounds in a week.  He appears euvolemic and well compensated on exam.  Surgical sites are clean and intact without evidence of infection. Reviewed ED precautions.  Continue aspirin  81 mg daily, Plavix  75 mg daily, Jardiance  10 mg daily, Crestor  20 mg daily.  Palpitations: Patient notes that after exertion and lying down he feels as though his heart is beating extremely hard.  His wife notes that when she auscultates, intermittently sounds as though his heart is out of rhythm.  Given fatigue, lightheadedness and shortness of breath will have patient wear a 2-week live ZIO monitor.  Reviewed ED precautions.  HFpEF: 09/12/24 echo: LVE 55-60%, no WMAs, grade I dd, normal RV function. CXR 12/7 no acute process, CT PE with no PE, small left pleural effusion. Patient started on Jardiance  10 mg daily and Torsemide  20 mg daily.  Question if patient has been overdiuresed, he is noting significant fatigue, dizziness and lightheadedness with position changes and with exertion.  He does note shortness of breath however I question if this is related more to fatigue, deconditioning and his low blood pressures.  His weights have been downtrending and creatinine had increased following hospital discharge.  Will have patient decrease  metoprolol  as noted above and transition torsemide  to as needed for increased lower extremity edema, weight gain up to 3 pounds overnight or 5 pounds in a week.  He will repeat lab work tomorrow.  HTN: Initial blood pressure 94/66, on recheck 98/56.  Patient with increased fatigue, dizziness and lightheadedness with position changes as noted above.  Will decrease metoprolol  to 12.5 mg twice daily and decrease torsemide  as noted above.  Encouraged patient to continue monitoring.  He will have close follow-up.  HLD: Last lipid profile on 09/17/2024 indicated total cholesterol 63, HDL 19.6, triglycerides 181 and LDL 18.  Patient with recent increase in LFTs as noted below, have been downtrending.  He will have labs rechecked tomorrow.  Continue Crestor  20 mg daily.  CKD: Creatinine 1.89 on 09/14/24, recheck on 12/12 with creatinine 1.71. To have labwork rechecked tomorrow. Per notes from PCP has been referred to nephrology.   Elevated LFTs: Lab work on 09/11/2024 indicated AST 164, ALT 303, has been downtrending most recent AST 55 and ALT 133.  He is to have lab work rechecked tomorrow.    Cardiac Rehabilitation Eligibility Assessment      Disposition: F/u with Lum Louis, NP on 10/01/24   Signed, Rosabel JONETTA Mose, NP       [1]  No outpatient medications have been marked as taking for the 09/22/24 encounter (Office Visit) with Deva Ron D, NP.

## 2024-09-21 NOTE — Telephone Encounter (Unsigned)
 Copied from CRM #[id number]. Topic: Appointments - Appointment Info/Confirmation >> Sep 21, 2024  8:13 AM Montie POUR wrote: Patient/patient representative is calling for information regarding an appointment.  [place] , spouse, wants to know if he needs to fast for labs tomorrow and can he take his medications tomorrow morning. Please call her at [phone].

## 2024-09-21 NOTE — Telephone Encounter (Signed)
 Returned call to get further information. No answer. Left message and call back number.

## 2024-09-21 NOTE — Telephone Encounter (Signed)
 Called and spoke with patient and wife. Voiced understanding.

## 2024-09-21 NOTE — Telephone Encounter (Signed)
 error

## 2024-09-21 NOTE — Telephone Encounter (Signed)
 Pt Has an appt coming up soon with Lum Louis, NP  Wife reports that He is really tired, sob with very short distances. His lungs sound clear, but his heart is skipping beats a lot. Some dizziness with standing for a few minutes.   Pulse 75-90's(with activity)  Bp- at rest before lunch was: 92/64   Ave BP has been: 118-128/70's  Pulse ox 96-99  He has been drinking and eating ok.   Encouraged to make sure drinking enough fluids with electrolytes. Asked to check BP when/if he does feel dizzy to see if bp is going down at all.   Appt made with APP for tomorrow 09/22/24. Given ER precautions. Pt and wife verbalized understanding of information.

## 2024-09-21 NOTE — Telephone Encounter (Signed)
 Pt c/o Shortness Of Breath: STAT if SOB developed within the last 24 hours or pt is noticeably SOB on the phone  1. Are you currently SOB (can you hear that pt is SOB on the phone)? Spoke to wife  2. How long have you been experiencing SOB? On and off since surgery  3. Are you SOB when sitting or when up moving around? Moving around  4. Are you currently experiencing any other symptoms? Heart skipping beats, fatigue, not sleeping

## 2024-09-22 ENCOUNTER — Encounter: Payer: Self-pay | Admitting: Cardiology

## 2024-09-22 ENCOUNTER — Other Ambulatory Visit

## 2024-09-22 ENCOUNTER — Ambulatory Visit

## 2024-09-22 ENCOUNTER — Ambulatory Visit: Admitting: Cardiology

## 2024-09-22 VITALS — BP 94/66 | HR 75 | Ht 67.0 in | Wt 178.2 lb

## 2024-09-22 DIAGNOSIS — R42 Dizziness and giddiness: Secondary | ICD-10-CM

## 2024-09-22 DIAGNOSIS — E1122 Type 2 diabetes mellitus with diabetic chronic kidney disease: Secondary | ICD-10-CM | POA: Insufficient documentation

## 2024-09-22 DIAGNOSIS — N183 Chronic kidney disease, stage 3 unspecified: Secondary | ICD-10-CM | POA: Insufficient documentation

## 2024-09-22 DIAGNOSIS — Z951 Presence of aortocoronary bypass graft: Secondary | ICD-10-CM | POA: Insufficient documentation

## 2024-09-22 DIAGNOSIS — I10 Essential (primary) hypertension: Secondary | ICD-10-CM | POA: Insufficient documentation

## 2024-09-22 DIAGNOSIS — R002 Palpitations: Secondary | ICD-10-CM | POA: Insufficient documentation

## 2024-09-22 DIAGNOSIS — I5032 Chronic diastolic (congestive) heart failure: Secondary | ICD-10-CM | POA: Insufficient documentation

## 2024-09-22 DIAGNOSIS — E782 Mixed hyperlipidemia: Secondary | ICD-10-CM | POA: Insufficient documentation

## 2024-09-22 DIAGNOSIS — I2510 Atherosclerotic heart disease of native coronary artery without angina pectoris: Secondary | ICD-10-CM | POA: Insufficient documentation

## 2024-09-22 MED ORDER — METOPROLOL TARTRATE 25 MG PO TABS: 12.5000 mg | ORAL_TABLET | Freq: Two times a day (BID) | ORAL | 3 refills | Status: DC

## 2024-09-22 NOTE — Progress Notes (Unsigned)
 ZIO AT serial # [id number] applied to patient.  Dr. Mallipedi to read.

## 2024-09-22 NOTE — Patient Instructions (Addendum)
 Medication Instructions:  Torsemide  20 mg as needed. Take if 2 - 3 pounds gained overnight or 5 pounds in a week.   Metoprolol  is now 12.5 mg 2 times per day.  *If you need a refill on your cardiac medications before your next appointment, please call your pharmacy*  Lab Work: None  If you have labs (blood work) drawn today and your tests are completely normal, you will receive your results only by: MyChart Message (if you have MyChart) OR A paper copy in the mail If you have any lab test that is abnormal or we need to change your treatment, we will call you to review the results.  Testing/Procedures: Zio Live Monitor ZIO AT Long term monitor-Live Telemetry  Your physician has requested you wear a ZIO patch monitor for 14 days.  This is a single patch monitor. [employer] supplies one patch monitor per enrollment. Additional  stickers are not available.  Please do not apply patch if you will be having a Nuclear Stress Test, Echocardiogram, Cardiac CT, MRI,  or Chest Xray during the period you would be wearing the monitor. The patch cannot be worn during  these tests. You cannot remove and re-apply the ZIO AT patch monitor.  Your ZIO patch monitor will be mailed 3 day USPS to your address on file. It may take 3-5 days to  receive your monitor after you have been enrolled.  Once you have received your monitor, please review the enclosed instructions. Your monitor has  already been registered assigning a specific monitor serial # to you.   Billing and Patient Assistance Program information  [place] has been supplied with any insurance information on record for billing. [employer] offers a sliding scale Patient Assistance Program for patients without insurance, or whose  insurance does not completely cover the cost of the ZIO patch monitor. You must apply for the  Patient Assistance Program to qualify for the discounted rate. To apply, call [employer] at [phone],  select option 4, select  option 2 , ask to apply for the Patient Assistance Program, (you can request an  interpreter if needed). [employer] will ask your household income and how many people are in your  household. [employer] will quote your out-of-pocket cost based on this information. They will also be able  to set up a 12 month interest free payment plan if needed.  Applying the monitor   Shave hair from upper left chest.  Hold the abrader disc by orange tab. Rub the abrader in 40 strokes over left upper chest as indicated in  your monitor instructions.  Clean area with 4 enclosed alcohol pads. Use all pads to ensure the area is cleaned thoroughly. Let  dry.  Apply patch as indicated in monitor instructions. Patch will be placed under collarbone on left side of  chest with arrow pointing upward.  Rub patch adhesive wings for 2 minutes. Remove the white label marked 1. Remove the white label  marked 2. Rub patch adhesive wings for 2 additional minutes.  While looking in a mirror, press and release button in center of patch. A small green light will flash 3-4  times. This will be your only indicator that the monitor has been turned on.  Do not shower for the first 24 hours. You may shower after the first 24 hours.  Press the button if you feel a symptom. You will hear a small click. Record Date, Time and Symptom in  the Patient Log.   Starting the [employer]  In your kit there is a small plastic box the size of a cellphone. This is [employer]. It transmits all your  recorded data to [facility]. This box must always stay within 10 feet of you. Open the box and push the *  button. There will be a light that blinks orange and then green a few times. When the light stops  blinking, the Gateway is connected to the ZIO patch. Call [employer] at [phone] to confirm your monitor is transmitting.  Returning your monitor  Remove your patch and place it inside the Gateway. In the lower half of the Gateway there is a white   bag with prepaid postage on it. Place Gateway in bag and seal. Mail package back to [place] as soon as  possible. Your physician should have your final report approximately 7 days after you have mailed back  your monitor. Call [facility] Customer Care at [phone] if you have questions regarding your ZIO AT  patch monitor. Call them immediately if you see an orange light blinking on your monitor.  If your monitor falls off in less than 4 days, contact our Monitor department at [phone]. If your  monitor becomes loose or falls off after 4 days call [employer] at [phone] for suggestions on  securing your monitor   Follow-Up: At [facility], you and your health needs are our priority.  As part of our continuing mission to provide you with exceptional heart care, our providers are all part of one team.  This team includes your primary Cardiologist (physician) and Advanced Practice Providers or APPs (Physician Assistants and Nurse Practitioners) who all work together to provide you with the care you need, when you need it.  Your next appointment:    As scheduled.  Provider:   Lum Louis, NP        Other Instructions None

## 2024-09-23 ENCOUNTER — Other Ambulatory Visit

## 2024-09-23 ENCOUNTER — Ambulatory Visit

## 2024-09-23 DIAGNOSIS — R7989 Other specified abnormal findings of blood chemistry: Secondary | ICD-10-CM

## 2024-09-23 LAB — CBC WITH DIFFERENTIAL/PLATELET
Basophils Absolute: 0.1 x10E3/uL (ref 0.0–0.2)
Basos: 1 %
EOS (ABSOLUTE): 0.2 x10E3/uL (ref 0.0–0.4)
Eos: 2 %
Hematocrit: 43.3 % (ref 37.5–51.0)
Hemoglobin: 14.2 g/dL (ref 13.0–17.7)
Immature Grans (Abs): 0 x10E3/uL (ref 0.0–0.1)
Immature Granulocytes: 0 %
Lymphocytes Absolute: 1.8 x10E3/uL (ref 0.7–3.1)
Lymphs: 24 %
MCH: 28.9 pg (ref 26.6–33.0)
MCHC: 32.8 g/dL (ref 31.5–35.7)
MCV: 88 fL (ref 79–97)
Monocytes Absolute: 0.7 x10E3/uL (ref 0.1–0.9)
Monocytes: 8 %
Neutrophils Absolute: 5 x10E3/uL (ref 1.4–7.0)
Neutrophils: 64 %
Platelets: 382 x10E3/uL (ref 150–450)
RBC: 4.92 x10E6/uL (ref 4.14–5.80)
RDW: 13.4 % (ref 11.6–15.4)
WBC: 7.8 x10E3/uL (ref 3.4–10.8)

## 2024-09-23 LAB — CMP14+EGFR
ALT: 66 IU/L — ABNORMAL HIGH (ref 0–44)
AST: 41 IU/L — ABNORMAL HIGH (ref 0–40)
Albumin: 4.3 g/dL (ref 3.9–4.9)
Alkaline Phosphatase: 108 IU/L (ref 47–123)
BUN/Creatinine Ratio: 29 — ABNORMAL HIGH (ref 10–24)
BUN: 43 mg/dL — ABNORMAL HIGH (ref 8–27)
Bilirubin Total: 0.8 mg/dL (ref 0.0–1.2)
CO2: 16 mmol/L — ABNORMAL LOW (ref 20–29)
Calcium: 9.7 mg/dL (ref 8.6–10.2)
Chloride: 99 mmol/L (ref 96–106)
Creatinine, Ser: 1.48 mg/dL — ABNORMAL HIGH (ref 0.76–1.27)
Globulin, Total: 2.5 g/dL (ref 1.5–4.5)
Glucose: 125 mg/dL — ABNORMAL HIGH (ref 70–99)
Potassium: 4.3 mmol/L (ref 3.5–5.2)
Sodium: 138 mmol/L (ref 134–144)
Total Protein: 6.8 g/dL (ref 6.0–8.5)
eGFR: 52 mL/min/1.73 — ABNORMAL LOW (ref >59)

## 2024-09-24 ENCOUNTER — Ambulatory Visit: Payer: Self-pay | Admitting: Family Medicine

## 2024-09-25 ENCOUNTER — Encounter (HOSPITAL_COMMUNITY): Admission: EM | Disposition: A | Payer: Self-pay | Source: Home / Self Care | Attending: Cardiovascular Disease

## 2024-09-25 ENCOUNTER — Inpatient Hospital Stay (HOSPITAL_COMMUNITY)
Admission: EM | Admit: 2024-09-25 | Discharge: 2024-09-28 | DRG: 322 | Disposition: A | Discharge status: Home Health | Attending: Cardiovascular Disease | Admitting: Cardiovascular Disease

## 2024-09-25 DIAGNOSIS — I5030 Unspecified diastolic (congestive) heart failure: Secondary | ICD-10-CM | POA: Diagnosis present

## 2024-09-25 DIAGNOSIS — Z79899 Other long term (current) drug therapy: Secondary | ICD-10-CM

## 2024-09-25 DIAGNOSIS — I152 Hypertension secondary to endocrine disorders: Secondary | ICD-10-CM | POA: Diagnosis present

## 2024-09-25 DIAGNOSIS — Z8249 Family history of ischemic heart disease and other diseases of the circulatory system: Secondary | ICD-10-CM

## 2024-09-25 DIAGNOSIS — I2489 Other forms of acute ischemic heart disease: Secondary | ICD-10-CM | POA: Diagnosis not present

## 2024-09-25 DIAGNOSIS — E119 Type 2 diabetes mellitus without complications: Secondary | ICD-10-CM

## 2024-09-25 DIAGNOSIS — E7849 Other hyperlipidemia: Secondary | ICD-10-CM | POA: Diagnosis present

## 2024-09-25 DIAGNOSIS — I213 ST elevation (STEMI) myocardial infarction of unspecified site: Secondary | ICD-10-CM | POA: Diagnosis present

## 2024-09-25 DIAGNOSIS — Z860100 Personal history of colon polyps, unspecified: Secondary | ICD-10-CM

## 2024-09-25 DIAGNOSIS — E1122 Type 2 diabetes mellitus with diabetic chronic kidney disease: Secondary | ICD-10-CM | POA: Diagnosis present

## 2024-09-25 DIAGNOSIS — Z7984 Long term (current) use of oral hypoglycemic drugs: Secondary | ICD-10-CM

## 2024-09-25 DIAGNOSIS — N1832 Chronic kidney disease, stage 3b: Secondary | ICD-10-CM | POA: Diagnosis present

## 2024-09-25 DIAGNOSIS — Z7982 Long term (current) use of aspirin: Secondary | ICD-10-CM

## 2024-09-25 DIAGNOSIS — I214 Non-ST elevation (NSTEMI) myocardial infarction: Secondary | ICD-10-CM | POA: Diagnosis present

## 2024-09-25 DIAGNOSIS — Z801 Family history of malignant neoplasm of trachea, bronchus and lung: Secondary | ICD-10-CM | POA: Diagnosis not present

## 2024-09-25 DIAGNOSIS — E1169 Type 2 diabetes mellitus with other specified complication: Secondary | ICD-10-CM | POA: Diagnosis present

## 2024-09-25 DIAGNOSIS — I221 Subsequent ST elevation (STEMI) myocardial infarction of inferior wall: Secondary | ICD-10-CM | POA: Diagnosis present

## 2024-09-25 DIAGNOSIS — I2582 Chronic total occlusion of coronary artery: Secondary | ICD-10-CM | POA: Diagnosis present

## 2024-09-25 DIAGNOSIS — I4729 Other ventricular tachycardia: Secondary | ICD-10-CM | POA: Insufficient documentation

## 2024-09-25 DIAGNOSIS — Z7902 Long term (current) use of antithrombotics/antiplatelets: Secondary | ICD-10-CM

## 2024-09-25 DIAGNOSIS — Z833 Family history of diabetes mellitus: Secondary | ICD-10-CM | POA: Diagnosis not present

## 2024-09-25 DIAGNOSIS — I25810 Atherosclerosis of coronary artery bypass graft(s) without angina pectoris: Secondary | ICD-10-CM | POA: Diagnosis present

## 2024-09-25 DIAGNOSIS — Z8042 Family history of malignant neoplasm of prostate: Secondary | ICD-10-CM

## 2024-09-25 DIAGNOSIS — M549 Dorsalgia, unspecified: Secondary | ICD-10-CM | POA: Diagnosis present

## 2024-09-25 DIAGNOSIS — E1140 Type 2 diabetes mellitus with diabetic neuropathy, unspecified: Secondary | ICD-10-CM | POA: Diagnosis present

## 2024-09-25 DIAGNOSIS — I472 Ventricular tachycardia, unspecified: Secondary | ICD-10-CM | POA: Diagnosis not present

## 2024-09-25 DIAGNOSIS — Z951 Presence of aortocoronary bypass graft: Secondary | ICD-10-CM

## 2024-09-25 DIAGNOSIS — Z800 Family history of malignant neoplasm of digestive organs: Secondary | ICD-10-CM

## 2024-09-25 DIAGNOSIS — Z8673 Personal history of transient ischemic attack (TIA), and cerebral infarction without residual deficits: Secondary | ICD-10-CM

## 2024-09-25 DIAGNOSIS — Z8052 Family history of malignant neoplasm of bladder: Secondary | ICD-10-CM | POA: Diagnosis not present

## 2024-09-25 DIAGNOSIS — I2121 ST elevation (STEMI) myocardial infarction involving left circumflex coronary artery: Principal | ICD-10-CM

## 2024-09-25 DIAGNOSIS — I2510 Atherosclerotic heart disease of native coronary artery without angina pectoris: Secondary | ICD-10-CM | POA: Diagnosis present

## 2024-09-25 DIAGNOSIS — E1159 Type 2 diabetes mellitus with other circulatory complications: Secondary | ICD-10-CM | POA: Diagnosis present

## 2024-09-25 DIAGNOSIS — T82857A Stenosis of cardiac prosthetic devices, implants and grafts, initial encounter: Secondary | ICD-10-CM | POA: Diagnosis present

## 2024-09-25 DIAGNOSIS — Z823 Family history of stroke: Secondary | ICD-10-CM | POA: Diagnosis not present

## 2024-09-25 DIAGNOSIS — Z955 Presence of coronary angioplasty implant and graft: Principal | ICD-10-CM

## 2024-09-25 HISTORY — PX: CORONARY/GRAFT ACUTE MI REVASCULARIZATION: CATH118305

## 2024-09-25 HISTORY — PX: LEFT HEART CATH AND CORONARY ANGIOGRAPHY: CATH118249

## 2024-09-25 LAB — POCT I-STAT, CHEM 8
BUN: 41 mg/dL — ABNORMAL HIGH (ref 8–23)
Calcium, Ion: 1.25 mmol/L (ref 1.15–1.40)
Chloride: 100 mmol/L (ref 98–111)
Creatinine, Ser: 1.6 mg/dL — ABNORMAL HIGH (ref 0.61–1.24)
Glucose, Bld: 180 mg/dL — ABNORMAL HIGH (ref 70–99)
HCT: 37 % — ABNORMAL LOW (ref 39.0–52.0)
Hemoglobin: 12.6 g/dL — ABNORMAL LOW (ref 13.0–17.0)
Potassium: 3.2 mmol/L — ABNORMAL LOW (ref 3.5–5.1)
Sodium: 138 mmol/L (ref 135–145)
TCO2: 22 mmol/L (ref 22–32)

## 2024-09-25 LAB — CBC WITH DIFFERENTIAL/PLATELET
Abs Immature Granulocytes: 0.02 K/uL (ref 0.00–0.07)
Basophils Absolute: 0.1 K/uL (ref 0.0–0.1)
Basophils Relative: 1 %
Eosinophils Absolute: 0.1 K/uL (ref 0.0–0.5)
Eosinophils Relative: 1 %
HCT: 37.7 % — ABNORMAL LOW (ref 39.0–52.0)
Hemoglobin: 12.9 g/dL — ABNORMAL LOW (ref 13.0–17.0)
Immature Granulocytes: 0 %
Lymphocytes Relative: 15 %
Lymphs Abs: 1.3 K/uL (ref 0.7–4.0)
MCH: 29.1 pg (ref 26.0–34.0)
MCHC: 34.2 g/dL (ref 30.0–36.0)
MCV: 85.1 fL (ref 80.0–100.0)
Monocytes Absolute: 0.6 K/uL (ref 0.1–1.0)
Monocytes Relative: 7 %
Neutro Abs: 6.8 K/uL (ref 1.7–7.7)
Neutrophils Relative %: 76 %
Platelets: 238 K/uL (ref 150–400)
RBC: 4.43 MIL/uL (ref 4.22–5.81)
RDW: 13.2 % (ref 11.5–15.5)
WBC: 8.9 K/uL (ref 4.0–10.5)
nRBC: 0 % (ref 0.0–0.2)

## 2024-09-25 LAB — COMPREHENSIVE METABOLIC PANEL WITH GFR
ALT: 58 U/L — ABNORMAL HIGH (ref 0–44)
AST: 46 U/L — ABNORMAL HIGH (ref 15–41)
Albumin: 3.8 g/dL (ref 3.5–5.0)
Alkaline Phosphatase: 93 U/L (ref 38–126)
Anion gap: 16 — ABNORMAL HIGH (ref 5–15)
BUN: 42 mg/dL — ABNORMAL HIGH (ref 8–23)
CO2: 21 mmol/L — ABNORMAL LOW (ref 22–32)
Calcium: 9.5 mg/dL (ref 8.9–10.3)
Chloride: 100 mmol/L (ref 98–111)
Creatinine, Ser: 1.45 mg/dL — ABNORMAL HIGH (ref 0.61–1.24)
GFR, Estimated: 53 mL/min — ABNORMAL LOW
Glucose, Bld: 136 mg/dL — ABNORMAL HIGH (ref 70–99)
Potassium: 3.3 mmol/L — ABNORMAL LOW (ref 3.5–5.1)
Sodium: 137 mmol/L (ref 135–145)
Total Bilirubin: 0.5 mg/dL (ref 0.0–1.2)
Total Protein: 6.4 g/dL — ABNORMAL LOW (ref 6.5–8.1)

## 2024-09-25 LAB — LIPID PANEL
Cholesterol: 56 mg/dL (ref 0–200)
HDL: 23 mg/dL — ABNORMAL LOW
LDL Cholesterol: 18 mg/dL (ref 0–99)
Total CHOL/HDL Ratio: 2.5 ratio
Triglycerides: 76 mg/dL
VLDL: 15 mg/dL (ref 0–40)

## 2024-09-25 LAB — CG4 I-STAT (LACTIC ACID): Lactic Acid, Venous: 1.2 mmol/L (ref 0.5–1.9)

## 2024-09-25 LAB — MRSA NEXT GEN BY PCR, NASAL: MRSA by PCR Next Gen: NOT DETECTED

## 2024-09-25 LAB — GLUCOSE, CAPILLARY: Glucose-Capillary: 125 mg/dL — ABNORMAL HIGH (ref 70–99)

## 2024-09-25 LAB — APTT: aPTT: >200 s (ref 24–36)

## 2024-09-25 LAB — HEMOGLOBIN A1C
Hgb A1c MFr Bld: 7 % — ABNORMAL HIGH (ref 4.8–5.6)
Mean Plasma Glucose: 154.2 mg/dL

## 2024-09-25 LAB — POCT ACTIVATED CLOTTING TIME
Activated Clotting Time: 235 s
Activated Clotting Time: 266 s
Activated Clotting Time: 296 s

## 2024-09-25 LAB — TROPONIN T, HIGH SENSITIVITY: Troponin T High Sensitivity: 273 ng/L (ref 0–19)

## 2024-09-25 LAB — LACTIC ACID, PLASMA: Lactic Acid, Venous: 1.3 mmol/L (ref 0.5–1.9)

## 2024-09-25 LAB — PROTIME-INR
INR: 1.3 — ABNORMAL HIGH (ref 0.8–1.2)
Prothrombin Time: 17 s — ABNORMAL HIGH (ref 11.4–15.2)

## 2024-09-25 SURGERY — CORONARY/GRAFT ACUTE MI REVASCULARIZATION
Anesthesia: LOCAL

## 2024-09-25 MED ORDER — LIDOCAINE HCL (PF) 1 % IJ SOLN
INTRAMUSCULAR | Status: AC
  Filled 2024-09-25: qty 30

## 2024-09-25 MED ORDER — FENTANYL CITRATE (PF) 100 MCG/2ML IJ SOLN
INTRAMUSCULAR | Status: AC
  Filled 2024-09-25: qty 2

## 2024-09-25 MED ORDER — FENTANYL CITRATE (PF) 100 MCG/2ML IJ SOLN
INTRAMUSCULAR | Status: DC | PRN
  Administered 2024-09-25: 25 ug via INTRAVENOUS
  Administered 2024-09-25: 50 ug via INTRAVENOUS

## 2024-09-25 MED ORDER — IOHEXOL 350 MG/ML SOLN: INTRAVENOUS | Status: DC | PRN

## 2024-09-25 MED ORDER — ONDANSETRON HCL 4 MG/2ML IJ SOLN: 4.0000 mg | Freq: Four times a day (QID) | INTRAMUSCULAR | Status: DC | PRN

## 2024-09-25 MED ORDER — SODIUM CHLORIDE 0.9 % IV SOLN: 250.0000 mL | INTRAVENOUS | Status: AC | PRN

## 2024-09-25 MED ORDER — METOPROLOL TARTRATE 12.5 MG HALF TABLET: 12.5000 mg | ORAL_TABLET | Freq: Two times a day (BID) | ORAL | Status: DC

## 2024-09-25 MED ORDER — OXYCODONE HCL 5 MG PO TABS: 5.0000 mg | ORAL_TABLET | ORAL | Status: DC | PRN

## 2024-09-25 MED ORDER — SODIUM CHLORIDE 0.9 % IV SOLN: INTRAVENOUS | Status: AC

## 2024-09-25 MED ORDER — TICAGRELOR 90 MG PO TABS
90.0000 mg | ORAL_TABLET | Freq: Two times a day (BID) | ORAL | Status: DC
  Administered 2024-09-26 – 2024-09-28 (×5): 90 mg via ORAL
  Filled 2024-09-25 (×5): qty 1

## 2024-09-25 MED ORDER — HEPARIN SODIUM (PORCINE) 1000 UNIT/ML IJ SOLN
INTRAMUSCULAR | Status: AC
  Filled 2024-09-25: qty 10

## 2024-09-25 MED ORDER — VERAPAMIL HCL 2.5 MG/ML IV SOLN
INTRAVENOUS | Status: DC | PRN
  Administered 2024-09-25: 10 mL via INTRA_ARTERIAL

## 2024-09-25 MED ORDER — MIDAZOLAM HCL 2 MG/2ML IJ SOLN
INTRAMUSCULAR | Status: AC
  Filled 2024-09-25: qty 2

## 2024-09-25 MED ORDER — LABETALOL HCL 5 MG/ML IV SOLN: 10.0000 mg | INTRAVENOUS | Status: AC | PRN

## 2024-09-25 MED ORDER — TRAZODONE HCL 50 MG PO TABS
50.0000 mg | ORAL_TABLET | Freq: Once | ORAL | Status: AC
  Administered 2024-09-25: 50 mg via ORAL
  Filled 2024-09-25: qty 1

## 2024-09-25 MED ORDER — MORPHINE SULFATE (PF) 2 MG/ML IV SOLN: 2.0000 mg | INTRAVENOUS | Status: DC | PRN

## 2024-09-25 MED ORDER — MIDAZOLAM HCL (PF) 2 MG/2ML IJ SOLN
INTRAMUSCULAR | Status: DC | PRN
  Administered 2024-09-25: 1 mg via INTRAVENOUS
  Administered 2024-09-25: 2 mg via INTRAVENOUS

## 2024-09-25 MED ORDER — ASPIRIN 81 MG PO TBEC
81.0000 mg | DELAYED_RELEASE_TABLET | Freq: Every day | ORAL | Status: DC
  Administered 2024-09-26 – 2024-09-28 (×3): 81 mg via ORAL
  Filled 2024-09-25 (×3): qty 1

## 2024-09-25 MED ORDER — VERAPAMIL HCL 2.5 MG/ML IV SOLN
INTRAVENOUS | Status: AC
  Filled 2024-09-25: qty 2

## 2024-09-25 MED ORDER — SODIUM CHLORIDE 0.9% FLUSH
3.0000 mL | Freq: Two times a day (BID) | INTRAVENOUS | Status: DC
  Administered 2024-09-25 – 2024-09-28 (×5): 3 mL via INTRAVENOUS

## 2024-09-25 MED ORDER — LIDOCAINE HCL (PF) 1 % IJ SOLN
INTRAMUSCULAR | Status: DC | PRN
  Administered 2024-09-25: 2 mL

## 2024-09-25 MED ORDER — SODIUM CHLORIDE 0.9 % IV SOLN
INTRAVENOUS | Status: AC | PRN
  Administered 2024-09-25: 10 mL/h via INTRAVENOUS

## 2024-09-25 MED ORDER — ACETAMINOPHEN 325 MG PO TABS: 650.0000 mg | ORAL_TABLET | ORAL | Status: DC | PRN

## 2024-09-25 MED ORDER — DOXYLAMINE SUCCINATE (SLEEP) 25 MG PO TABS
25.0000 mg | ORAL_TABLET | Freq: Once | ORAL | Status: AC | PRN
  Administered 2024-09-25: 25 mg via ORAL
  Filled 2024-09-25: qty 1

## 2024-09-25 MED ORDER — NITROGLYCERIN 0.4 MG SL SUBL: 0.4000 mg | SUBLINGUAL_TABLET | SUBLINGUAL | Status: DC | PRN

## 2024-09-25 MED ORDER — SODIUM CHLORIDE 0.9% FLUSH: 3.0000 mL | INTRAVENOUS | Status: DC | PRN

## 2024-09-25 MED ORDER — HEPARIN SODIUM (PORCINE) 1000 UNIT/ML IJ SOLN
INTRAMUSCULAR | Status: DC | PRN
  Administered 2024-09-25: 4000 [IU] via INTRAVENOUS
  Administered 2024-09-25: 8000 [IU] via INTRAVENOUS
  Administered 2024-09-25: 4000 [IU] via INTRAVENOUS

## 2024-09-25 MED ORDER — HEPARIN (PORCINE) IN NACL 1000-0.9 UT/500ML-% IV SOLN
INTRAVENOUS | Status: DC | PRN
  Administered 2024-09-25 (×2): 500 mL

## 2024-09-25 MED ORDER — ROSUVASTATIN CALCIUM 20 MG PO TABS
20.0000 mg | ORAL_TABLET | Freq: Every day | ORAL | Status: DC
  Administered 2024-09-26 – 2024-09-27 (×2): 20 mg via ORAL
  Filled 2024-09-25 (×2): qty 1

## 2024-09-25 MED ORDER — TICAGRELOR 90 MG PO TABS: 90.0000 mg | ORAL_TABLET | Freq: Two times a day (BID) | ORAL | Status: DC

## 2024-09-25 MED ORDER — ENOXAPARIN SODIUM 40 MG/0.4ML IJ SOSY
40.0000 mg | PREFILLED_SYRINGE | INTRAMUSCULAR | Status: DC
  Administered 2024-09-26 – 2024-09-28 (×3): 40 mg via SUBCUTANEOUS
  Filled 2024-09-25 (×3): qty 0.4

## 2024-09-25 MED ORDER — POTASSIUM CHLORIDE CRYS ER 20 MEQ PO TBCR
60.0000 meq | EXTENDED_RELEASE_TABLET | Freq: Once | ORAL | Status: AC
  Administered 2024-09-25: 60 meq via ORAL
  Filled 2024-09-25: qty 3

## 2024-09-25 MED ORDER — HYDRALAZINE HCL 20 MG/ML IJ SOLN: 10.0000 mg | INTRAMUSCULAR | Status: AC | PRN

## 2024-09-25 MED ORDER — INSULIN ASPART 100 UNIT/ML IJ SOLN
0.0000 [IU] | Freq: Three times a day (TID) | INTRAMUSCULAR | Status: DC
  Filled 2024-09-25 (×2): qty 2

## 2024-09-25 SURGICAL SUPPLY — 13 items
BALLOON SAPPHIRE 2.0X12 (BALLOONS) IMPLANT
CATH INFINITI 5FR MULTPACK ANG (CATHETERS) IMPLANT
CATH LAUNCHER 6FR AL1 (CATHETERS) IMPLANT
CATH LAUNCHER 6FR EBU3.5 (CATHETERS) IMPLANT
CATH SUPER TORQUE PLUS 6F MPA1 (CATHETERS) IMPLANT
CATH VISTA GUIDE 6FR JL3.5 (CATHETERS) IMPLANT
DEVICE RAD COMP TR BAND LRG (VASCULAR PRODUCTS) IMPLANT
GLIDESHEATH SLEND SS 6F .021 (SHEATH) IMPLANT
GUIDEWIRE INQWIRE 1.5J.035X260 (WIRE) IMPLANT
KIT ENCORE 26 ADVANTAGE (KITS) IMPLANT
PACK CARDIAC CATHETERIZATION (CUSTOM PROCEDURE TRAY) ×1 IMPLANT
SET ATX-X65L (MISCELLANEOUS) IMPLANT
WIRE RUNTHROUGH IZANAI 014 180 (WIRE) IMPLANT

## 2024-09-25 NOTE — H&P (Signed)
 "  Cardiology Admission History and Physical   Patient ID: [name] MRN: [id number]; DOB: 1956/06/03   Admission date: 09/25/2024  PCP:  Severa Rock HERO, FNP   [facility] Providers Cardiologist:  Diannah SHAUNNA Maywood, MD       Chief Complaint: Chest pain  Patient Profile: Tyeson Tanimoto. is a 68 y.o. male with recent CABG who is being seen 09/25/2024 for the evaluation of STEMI.  History of Present Illness: Mr. Cogbill presented with non-STEMI last month.  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  He has had some problems since surgery, mostly with fatigue and some episodes of heart palpitations.  Today, he developed acute onset of back and chest pain and EMS was called.  A code STEMI was called in the field after his EKG confirmed acute inferolateral ST elevation.  The patient is transported directly to the cardiac catheterization lab where he complains of ongoing 5/10 chest pain.  He describes the pain as a pressure-like sensation.  He has had some shortness of breath that is unchanged.  No diaphoresis, nausea, or vomiting.  The patient has been compliant with his medications.  He has no other complaints today.   Past Medical History:  Diagnosis Date   Acid reflux    Colon polyps    Diabetes mellitus type 2 in nonobese (HCC) 12/13/2020   Family history of genetic mutation for hereditary nonpolyposis colorectal cancer (HNPCC)    Family history of prostate cancer    Family history of stomach cancer    HOH (hard of hearing)    Hypertension    Sleep apnea    CPAP   Stroke (HCC) 2015   TIA (transient ischemic attack) 2016   Past Surgical History:  Procedure Laterality Date   CHOLECYSTECTOMY N/A 04/17/2020   Procedure: LAPAROSCOPIC CHOLECYSTECTOMY;  Surgeon: Mavis Anes, MD;  Location: [facility] ORS;   Service: General;  Laterality: N/A;   COLONOSCOPY  2018   COLONOSCOPY WITH PROPOFOL  N/A 12/07/2021   Surgeon: Cindie Dunnings K, DO;   nonbleeding internal hemorrhoids, 8 mm polyp (tubular adenoma) in the descending colon removed.  Recommended 5-year surveillance.   CORONARY ARTERY BYPASS GRAFT N/A 09/01/2024   Procedure: CORONARY ARTERY BYPASS GRAFTING X 4, USING LEFT INTERAL MAMMARY ARTERY AND ENDOSCOPIC HARVESTED LEFT GREATER SAPHENOUS VEIN;  Surgeon: Daniel Con RAMAN, MD;  Location: [facility] OR;  Service: Open Heart Surgery;  Laterality: N/A;   INTRAOPERATIVE TRANSESOPHAGEAL ECHOCARDIOGRAM N/A 09/01/2024   Procedure: ECHOCARDIOGRAM, TRANSESOPHAGEAL, INTRAOPERATIVE;  Surgeon: Daniel Con RAMAN, MD;  Location: [facility] OR;  Service: Open Heart Surgery;  Laterality: N/A;   LEFT HEART CATH AND CORONARY ANGIOGRAPHY N/A 08/30/2024   Procedure: LEFT HEART CATH AND CORONARY ANGIOGRAPHY;  Surgeon: Mady Bruckner, MD;  Location: [facility] INVASIVE CV LAB;  Service: Cardiovascular;  Laterality: N/A;   POLYPECTOMY  12/07/2021   Procedure: POLYPECTOMY;  Surgeon: Cindie Dunnings POUR, DO;  Location: [facility] ENDO SUITE;  Service: Endoscopy;;     Medications Prior to Admission: Prior to Admission medications  Medication Sig Start Date End Date Taking? Authorizing Provider  Accu-Chek FastClix Lancets MISC CHECK BS DAILY DX E11.9 01/07/24   Severa Rock HERO, FNP  albuterol  (VENTOLIN  HFA) 108 (90 Base) MCG/ACT inhaler Inhale 2 puffs into the lungs every 6 (six) hours as needed for shortness of breath. 06/19/24  [provider]  Blood Glucose Monitoring Suppl (ACCU-CHEK GUIDE ME) w/Device KIT CHECK BS DAILY DX E11.9 07/07/23   Severa Rock HERO, FNP  clopidogrel  (PLAVIX ) 75 MG tablet Take 1 tablet (75 mg total) by mouth daily. 09/05/24 09/05/25  Barrett, Erin R, PA-C  Continuous Glucose Sensor (DEXCOM G7 SENSOR) MISC 1 Device by Does not apply route every 14 (fourteen) days. 05/05/24   Severa Rock HERO, FNP  empagliflozin  (JARDIANCE ) 10 MG TABS  tablet Take 1 tablet (10 mg total) by mouth daily. 09/14/24   Vicci Afton CROME, MD  glucose blood (FREESTYLE LITE) test strip Check BS daily Dx E11.9 01/07/24   Severa Rock HERO, FNP  metFORMIN  (GLUCOPHAGE ) 500 MG tablet Take 1 tablet (500 mg total) by mouth 2 (two) times daily with a meal. 09/05/24   Barrett, Erin R, PA-C  metoprolol  tartrate (LOPRESSOR ) 25 MG tablet Take 0.5 tablets (12.5 mg total) by mouth 2 (two) times daily. 09/22/24   West, Katlyn D, NP  rosuvastatin  (CRESTOR ) 20 MG tablet Take 1 tablet (20 mg total) by mouth daily at 6 PM. 09/05/24   Barrett, Erin R, PA-C  torsemide  (DEMADEX ) 20 MG tablet Take 1 tablet (20 mg total) by mouth daily. 09/14/24   Vicci Afton CROME, MD     Allergies:   Allergies[1]  Social History:   Social History   Socioeconomic History   Marital status: Married    Spouse name: [place]    Number of children: Not on file   Years of education: Not on file   Highest education level: Bachelor's degree (e.g., BA, AB, BS)  Occupational History   Occupation: retired     Comment: [employer]   Tobacco Use   Smoking status: Never   Smokeless tobacco: Never  Vaping Use   Vaping status: Never Used  Substance and Sexual Activity   Alcohol use: No   Drug use: No   Sexual activity: Yes    Birth control/protection: None  Other Topics Concern   Not on file  Social History Narrative   Married , 1 step daughter    Social Drivers of Health   Tobacco Use: Low Risk (09/22/2024)   Patient History    Smoking Tobacco Use: Never    Smokeless Tobacco Use: Never    Passive Exposure: Not on file  Financial Resource Strain: Low Risk (05/05/2024)   Overall Financial Resource Strain (CARDIA)    Difficulty of Paying Living Expenses: Not hard at all  Food Insecurity: No Food Insecurity (09/12/2024)   Epic    Worried About Radiation Protection Practitioner of Food in the Last Year: Never true    Ran Out of Food in the Last Year: Never true  Transportation Needs: No Transportation Needs  (09/12/2024)   Epic    Lack of Transportation (Medical): No    Lack of Transportation (Non-Medical): No  Physical Activity: Sufficiently Active (05/05/2024)   Exercise Vital Sign    Days of Exercise per Week: 6 days    Minutes of Exercise per Session: 30 min  Recent Concern: Physical Activity - Insufficiently Active (03/18/2024)   Exercise Vital Sign    Days of Exercise per Week: 2 days    Minutes of Exercise per Session: 30 min  Stress: No Stress Concern Present (05/05/2024)   [employer] of Occupational Health - Occupational Stress Questionnaire    Feeling of Stress: Not at all  Social Connections: Socially Integrated (09/12/2024)   Social Connection and Isolation Panel    Frequency of Communication with Friends  and Family: More than three times a week    Frequency of Social Gatherings with Friends and Family: More than three times a week    Attends Religious Services: More than 4 times per year    Active Member of Clubs or Organizations: Yes    Attends Banker Meetings: More than 4 times per year    Marital Status: Married  Catering Manager Violence: Not At Risk (09/12/2024)   Epic    Fear of Current or Ex-Partner: No    Emotionally Abused: No    Physically Abused: No    Sexually Abused: No  Depression (PHQ2-9): Low Risk (09/07/2024)   Depression (PHQ2-9)    PHQ-2 Score: 0  Alcohol Screen: Low Risk (03/18/2024)   Alcohol Screen    Last Alcohol Screening Score (AUDIT): 0  Housing: Low Risk (09/12/2024)   Epic    Unable to Pay for Housing in the Last Year: No    Number of Times Moved in the Last Year: 0    Homeless in the Last Year: No  Utilities: Not At Risk (09/12/2024)   Epic    Threatened with loss of utilities: No  Health Literacy: Adequate Health Literacy (03/18/2024)   B1300 Health Literacy    Frequency of need for help with medical instructions: Never     Family History:   The patient's family history includes Bladder Cancer in an other family member;  Diabetes in his father; Heart disease in his father and mother; Hypertension in his mother; Lung cancer in his paternal grandmother; Melanoma in his father; Melanoma (age of onset: 74) in his mother; Prostate cancer (age of onset: 22) in his paternal grandfather; Prostate cancer (age of onset: 21) in his father; Stomach cancer (age of onset: 91) in his father; Stroke in his maternal grandmother and mother; Throat cancer in his paternal grandmother; Transient ischemic attack in his mother.    ROS:  Please see the history of present illness.  All other ROS reviewed and negative.     Physical Exam/Data: Vitals:   09/25/24 1548 09/25/24 1718  BP:  117/70  Pulse:  75  Resp:  14  SpO2: 100% 100%   No intake or output data in the 24 hours ending 09/25/24 1804    09/22/2024   10:45 AM 09/15/2024    3:21 PM 09/14/2024   12:00 PM  Last 3 Weights  Weight (lbs) 178 lb 3.2 oz 183 lb 183 lb 12.8 oz  Weight (kg) 80.831 kg 83.008 kg 83.371 kg     There is no height or weight on file to calculate BMI.  General:  Well nourished, well developed, in no acute distress HEENT: normal Neck: no JVD Vascular: No carotid bruits; Distal pulses 2+ bilaterally   Cardiac:  normal S1, S2; RRR; no murmur  Lungs:  clear to auscultation bilaterally, no wheezing, rhonchi or rales  Abd: soft, nontender, no hepatomegaly  Ext: no edema Musculoskeletal:  No deformities, BUE and BLE strength normal and equal Skin: warm and dry  Neuro:  CNs 2-12 intact, no focal abnormalities noted Psych:  Normal affect   EKG:  The ECG that was done was personally reviewed and demonstrates sinus rhythm with acute inferolateral STEMI pattern  Relevant CV Studies: Cardiac Studies & Procedures   ______________________________________________________________________   CARDIAC CATHETERIZATION  CARDIAC CATHETERIZATION 08/30/2024  Conclusion   Prox RCA lesion is 40% stenosed.   Dist RCA lesion is 100% stenosed.   Ramus lesion is  95% stenosed with 90%  stenosed side branch in Lat Ramus.   1st Mrg lesion is 95% stenosed.   RPAV lesion is 100% stenosed.   RPDA lesion is 100% stenosed.   Prox LAD lesion is 90% stenosed.   Mid LAD lesion is 80% stenosed.   LV end diastolic pressure is mildly elevated.   There is no aortic valve stenosis.   Anticipated discharge date to be determined.   Continue aspirin  81 mg daily.  Defer P2Y12 inhibitor pending cardiac surgery consultation.  Resume heparin  infusion 2 hours after TR band removal.  Conclusions: Severe three-vessel coronary artery disease, as detailed below, including sequential 90% and 80% proximal/mid LAD stenoses, 90-95% stenoses of bifurcating ramus intermedius, 95% stenosis of OM1, and chronic total occlusion of distal RCA. Mildly elevated left ventricular filling pressure (LVEDP 20 mmHg).  Recommendations: Inpatient cardiac surgery consultation for CABG. Resume heparin  infusion 2 hours after TR band has been removed. Aggressive secondary prevention of coronary artery disease. Transition from nitroglycerin  paste to isosorbide  mononitrate for antianginal therapy.  Lonni Hanson, MD [facility]  Findings Coronary Findings Diagnostic  Dominance: Right  Left Main Vessel is large. Vessel is angiographically normal.  Left Anterior Descending Vessel is moderate in size. Prox LAD lesion is 90% stenosed. The lesion is focal and eccentric. Mid LAD lesion is 80% stenosed.  Ramus Intermedius Vessel is moderate in size. Ramus lesion is 95% stenosed with 90% stenosed side branch in Lat Ramus.  Left Circumflex Vessel is moderate in size.  First Obtuse Marginal Branch 1st Mrg lesion is 95% stenosed.  Second Obtuse Marginal Branch Vessel is small in size.  Right Coronary Artery Vessel is large. Prox RCA lesion is 40% stenosed. Dist RCA lesion is 100% stenosed. The lesion is chronically occluded.  Right Posterior Descending Artery Collaterals RPDA  filled by collaterals from 2nd Sept.  RPDA lesion is 100% stenosed.  Right Posterior Atrioventricular Artery Collaterals RPAV filled by collaterals from Dist Cx.  RPAV lesion is 100% stenosed.  First Right Posterolateral Branch Vessel is small in size.  Second Right Posterolateral Branch Vessel is small in size.  Third Right Posterolateral Branch Vessel is small in size.  Intervention  No interventions have been documented.     ECHOCARDIOGRAM  ECHOCARDIOGRAM COMPLETE 09/12/2024  Narrative ECHOCARDIOGRAM REPORT    Patient Name:   Lora Glomski. Date of Exam: 09/12/2024 Medical Rec #:  [id number]         Height:       67.0 in Accession #:    [id number]        Weight:       197.4 lb Date of Birth:  08-08-1956          BSA:          2.011 m Patient Age:    68 years          BP:           118/71 mmHg Patient Gender: M                 HR:           86 bpm. Exam Location:  [facility]  Procedure: 2D Echo, Cardiac Doppler, Color Doppler and Intracardiac Opacification Agent (Both Spectral and Color Flow Doppler were utilized during procedure).  Indications:    CHF I50.31  History:        Patient has prior history of Echocardiogram examinations, most recent 08/30/2024. Stroke; Risk Factors:Diabetes.  Sonographer:    Tinnie Gosling RDCS Referring  Phys: [id number] JAN A MANSY  IMPRESSIONS   1. Left ventricular ejection fraction, by estimation, is 55 to 60%. The left ventricle has normal function. The left ventricle has no regional wall motion abnormalities. There is moderate left ventricular hypertrophy. Left ventricular diastolic parameters are consistent with Grade I diastolic dysfunction (impaired relaxation). 2. Right ventricular systolic function is normal. The right ventricular size is normal. 3. Left atrial size was mildly dilated. 4. The mitral valve is abnormal. Trivial mitral valve regurgitation. No evidence of mitral stenosis. 5. The aortic valve is tricuspid.  There is moderate calcification of the aortic valve. There is moderate thickening of the aortic valve. Aortic valve regurgitation is not visualized. Aortic valve sclerosis is present, with no evidence of aortic valve stenosis. 6. The inferior vena cava is normal in size with greater than 50% respiratory variability, suggesting right atrial pressure of 3 mmHg.  FINDINGS Left Ventricle: Left ventricular ejection fraction, by estimation, is 55 to 60%. The left ventricle has normal function. The left ventricle has no regional wall motion abnormalities. Strain was performed and the global longitudinal strain is indeterminate. The left ventricular internal cavity size was normal in size. There is moderate left ventricular hypertrophy. Left ventricular diastolic parameters are consistent with Grade I diastolic dysfunction (impaired relaxation).  Right Ventricle: The right ventricular size is normal. No increase in right ventricular wall thickness. Right ventricular systolic function is normal.  Left Atrium: Left atrial size was mildly dilated.  Right Atrium: Right atrial size was normal in size.  Pericardium: There is no evidence of pericardial effusion.  Mitral Valve: The mitral valve is abnormal. There is mild thickening of the mitral valve leaflet(s). There is mild calcification of the mitral valve leaflet(s). Mild mitral annular calcification. Trivial mitral valve regurgitation. No evidence of mitral valve stenosis.  Tricuspid Valve: The tricuspid valve is normal in structure. Tricuspid valve regurgitation is not demonstrated. No evidence of tricuspid stenosis.  Aortic Valve: The aortic valve is tricuspid. There is moderate calcification of the aortic valve. There is moderate thickening of the aortic valve. Aortic valve regurgitation is not visualized. Aortic valve sclerosis is present, with no evidence of aortic valve stenosis.  Pulmonic Valve: The pulmonic valve was normal in structure.  Pulmonic valve regurgitation is trivial. No evidence of pulmonic stenosis.  Aorta: The aortic root is normal in size and structure.  Venous: The inferior vena cava is normal in size with greater than 50% respiratory variability, suggesting right atrial pressure of 3 mmHg.  IAS/Shunts: No atrial level shunt detected by color flow Doppler.  Additional Comments: 3D was performed not requiring image post processing on an independent workstation and was indeterminate.   LEFT VENTRICLE PLAX 2D LVIDd:         2.30 cm     Diastology LVIDs:         1.90 cm     LV e' medial:    5.22 cm/s LV PW:         1.40 cm     LV E/e' medial:  12.5 LV IVS:        1.40 cm     LV e' lateral:   11.40 cm/s LVOT diam:     2.20 cm     LV E/e' lateral: 5.7 LV SV:         45 LV SV Index:   22 LVOT Area:     3.80 cm LV IVRT:       114 msec  LV Volumes (MOD)  LV vol d, MOD A2C: 29.7 ml LV vol d, MOD A4C: 35.7 ml LV vol s, MOD A2C: 11.3 ml LV vol s, MOD A4C: 11.8 ml LV SV MOD A2C:     18.4 ml LV SV MOD A4C:     35.7 ml LV SV MOD BP:      21.9 ml  RIGHT VENTRICLE            IVC RV S prime:     5.74 cm/s  IVC diam: 0.80 cm  LEFT ATRIUM             Index        RIGHT ATRIUM           Index LA diam:        3.70 cm 1.84 cm/m   RA Area:     11.10 cm LA Vol (A2C):   20.2 ml 10.04 ml/m  RA Volume:   20.10 ml  9.99 ml/m LA Vol (A4C):   23.7 ml 11.78 ml/m LA Biplane Vol: 21.7 ml 10.79 ml/m AORTIC VALVE LVOT Vmax:   76.50 cm/s LVOT Vmean:  51.500 cm/s LVOT VTI:    0.118 m  AORTA Ao Root diam: 3.10 cm Ao Asc diam:  3.60 cm  MITRAL VALVE MV Area (PHT): 3.45 cm    SHUNTS MV Decel Time: 220 msec    Systemic VTI:  0.12 m MV E velocity: 65.50 cm/s  Systemic Diam: 2.20 cm MV A velocity: 89.20 cm/s MV E/A ratio:  0.73  Maude Emmer MD Electronically signed by Maude Emmer MD Signature Date/Time: 09/12/2024/12:20:30 PM    Final   TEE  ECHO INTRAOPERATIVE TEE 09/01/2024  Narrative *INTRAOPERATIVE  TRANSESOPHAGEAL REPORT *    Patient Name:   Braxen Dobek. Date of Exam: 09/01/2024 Medical Rec #:  [id number]         Height:       67.0 in Accession #:    [id number]        Weight:       199.3 lb Date of Birth:  Oct 18, 1955          BSA:          2.02 m Patient Age:    68 years          BP:           111/59 mmHg Patient Gender: M                 HR:           76 bpm. Exam Location:  Anesthesiology  Transesophogeal exam was perform intraoperatively during surgical procedure. Patient was closely monitored under general anesthesia during the entirety of examination.  Indications:     CAD Performing Phys: [id number] BAILEY S SU  Complications: No known complications during this procedure. POST-OP IMPRESSIONS _ Left Ventricle: The left ventricle is unchanged from pre-bypass. The wall motion is Improved RWMA seen in preop exam. _ Right Ventricle: mildly reduced function. The cavity was normal. _ Left Atrial Appendage: The left atrial appendage appears unchanged from pre-bypass. _ Aortic Valve: The aortic valve appears unchanged from pre-bypass. _ Mitral Valve: The mitral valve appears unchanged from pre-bypass. _ Tricuspid Valve: The tricuspid valve appears unchanged from pre-bypass. _ Comments: Limited images post-op.  PRE-OP FINDINGS Left Ventricle: The left ventricle has normal systolic function, with an ejection fraction of 55-60%. The cavity size was normal. There is mild concentric left ventricular hypertrophy.   Right Ventricle: The right  ventricle has normal systolic function. The cavity was normal. There is no increase in right ventricular wall thickness.  Left Atrium: Left atrial size was normal in size. No left atrial/left atrial appendage thrombus was detected.  Right Atrium: Right atrial size was normal in size.  Interatrial Septum: No atrial level shunt detected by color flow Doppler.  Pericardium: There is no evidence of pericardial effusion.  Mitral Valve: The  mitral valve is normal in structure. Mitral valve regurgitation is moderate by color flow Doppler. The MR jet is centrally-directed. There is no evidence of mitral valve vegetation.  Tricuspid Valve: The tricuspid valve was normal in structure. Tricuspid valve regurgitation is trivial by color flow Doppler.  Aortic Valve: The aortic valve is tricuspid Aortic valve regurgitation was not visualized by color flow Doppler. There is no stenosis of the aortic valve.   Pulmonic Valve: The pulmonic valve was normal in structure. Pulmonic valve regurgitation is trivial by color flow Doppler.    Norleen Pope MD Electronically signed by Norleen Pope MD Signature Date/Time: 09/02/2024/9:28:33 AM    Final        ______________________________________________________________________________________________       Laboratory Data: High Sensitivity Troponin:  No results for input(s): TROPONINIHS in the last 720 hours.  Recent Labs  Lab 08/30/24 0535 08/30/24 0753 08/30/24 0950 09/11/24 1850 09/11/24 2036  TRNPT 409* 478* 444* 128* 129*        Chemistry Recent Labs  Lab 09/23/24 1049 09/25/24 1609  NA 138 138  K 4.3 3.2*  CL 99 100  CO2 16*  --   GLUCOSE 125* 180*  BUN 43* 41*  CREATININE 1.48* 1.60*  CALCIUM  9.7  --     Recent Labs  Lab 09/23/24 1049  PROT 6.8  ALBUMIN  4.3  AST 41*  ALT 66*  ALKPHOS 108  BILITOT 0.8   Lipids No results for input(s): CHOL, TRIG, HDL, LABVLDL, LDLCALC, CHOLHDL in the last 168 hours. Hematology Recent Labs  Lab 09/23/24 1049 09/25/24 1609  WBC 7.8  --   RBC 4.92  --   HGB 14.2 12.6*  HCT 43.3 37.0*  MCV 88  --   MCH 28.9  --   MCHC 32.8  --   RDW 13.4  --   PLT 382  --    Thyroid  No results for input(s): TSH, FREET4 in the last 168 hours. BNPNo results for input(s): BNP, PROBNP in the last 168 hours.  DDimer No results for input(s): DDIMER in the last 168 hours.  Radiology/Studies:  CARDIAC  CATHETERIZATION Result Date: 09/25/2024 1.  Patent left main with no significant stenosis 2.  Patent LAD with severe proximal stenosis and continued patency of the LIMA to LAD graft 3.  Severely stenotic ramus intermedius with total occlusion of the saphenous vein graft 4.  Total occlusion of the OM1, and total occlusion of the saphenous vein graft, unsuccessful PCI attempts at both the native vessel and the SVG occlusion sites 5.  Total occlusion of the native RCA and total occlusion of the SVG to RCA, PDA collateralized by the left coronary artery 6.  Normal LVEDP Recommendations: Medical therapy and staged PCI of the native ramus intermedius, likely on Monday as long as the patient is clinically stable.  Findings and treatment plan discussed with the patient and his wife who is present today.     Assessment and Plan: Acute inferolateral STEMI: Patient status post recent CABG but developed abrupt onset of chest discomfort within the past 2 hours and  EKG is diagnostic of STEMI.  Plan to proceed with emergency cardiac catheterization and PCI as indicated.  Further plan/medical therapy pending results.  Likely transition him from clopidogrel  to ticagrelor  for more potent P2 Y12 inhibition. Type 2 diabetes: Cover with sliding scale insulin  while here Hypertension: Will adjust antihypertensive medications pending his hemodynamic assessment and cardiac catheterization Mixed hyperlipidemia: Treated with rosuvastatin .  Lipids are extremely low with a recent LDL cholesterol of 18 CKD stage IIIb.  Patient will be at risk of AKI with chronic kidney disease and diabetes, STEMI treatment requiring cardiac catheterization.  Follow closely.  Disposition: Pending cardiac catheterization results.  Patient will be treated with heparin , likely loaded with ticagrelor , and will undergo PCI if indicated.  Risk Assessment/Risk Scores:   TIMI Risk Score for ST  Elevation MI:   The patient's TIMI risk score is 3, which  indicates a 4.4% risk of all cause mortality at 30 days.      Code Status: Full Code  Severity of Illness: The appropriate patient status for this patient is INPATIENT. Inpatient status is judged to be reasonable and necessary in order to provide the required intensity of service to ensure the patient's safety. The patient's presenting symptoms, physical exam findings, and initial radiographic and laboratory data in the context of their chronic comorbidities is felt to place them at high risk for further clinical deterioration. Furthermore, it is not anticipated that the patient will be medically stable for discharge from the hospital within 2 midnights of admission.   * I certify that at the point of admission it is my clinical judgment that the patient will require inpatient hospital care spanning beyond 2 midnights from the point of admission due to high intensity of service, high risk for further deterioration and high frequency of surveillance required.*  For questions or updates, please contact [facility] Please consult [email or web address] for contact info under       Signed, Ozell Fell, MD  09/25/2024 6:04 PM      [1] No Known Allergies  "

## 2024-09-26 ENCOUNTER — Encounter (HOSPITAL_COMMUNITY): Payer: Self-pay | Admitting: Cardiovascular Disease

## 2024-09-26 ENCOUNTER — Other Ambulatory Visit: Payer: Self-pay

## 2024-09-26 ENCOUNTER — Inpatient Hospital Stay (HOSPITAL_COMMUNITY)

## 2024-09-26 DIAGNOSIS — I2489 Other forms of acute ischemic heart disease: Secondary | ICD-10-CM | POA: Diagnosis not present

## 2024-09-26 LAB — ECHOCARDIOGRAM LIMITED
Height: 67 in
Weight: 2786.61 [oz_av]

## 2024-09-26 LAB — BASIC METABOLIC PANEL WITH GFR
Anion gap: 15 (ref 5–15)
BUN: 37 mg/dL — ABNORMAL HIGH (ref 8–23)
CO2: 21 mmol/L — ABNORMAL LOW (ref 22–32)
Calcium: 9 mg/dL (ref 8.9–10.3)
Chloride: 103 mmol/L (ref 98–111)
Creatinine, Ser: 1.29 mg/dL — ABNORMAL HIGH (ref 0.61–1.24)
GFR, Estimated: >60 mL/min
Glucose, Bld: 96 mg/dL (ref 70–99)
Potassium: 3.8 mmol/L (ref 3.5–5.1)
Sodium: 139 mmol/L (ref 135–145)

## 2024-09-26 LAB — GLUCOSE, CAPILLARY
Glucose-Capillary: 135 mg/dL — ABNORMAL HIGH (ref 70–99)
Glucose-Capillary: 92 mg/dL (ref 70–99)
Glucose-Capillary: 114 mg/dL — ABNORMAL HIGH (ref 70–99)
Glucose-Capillary: 120 mg/dL — ABNORMAL HIGH (ref 70–99)

## 2024-09-26 LAB — CBC
HCT: 37 % — ABNORMAL LOW (ref 39.0–52.0)
Hemoglobin: 12.4 g/dL — ABNORMAL LOW (ref 13.0–17.0)
MCH: 28.7 pg (ref 26.0–34.0)
MCHC: 33.5 g/dL (ref 30.0–36.0)
MCV: 85.6 fL (ref 80.0–100.0)
Platelets: 224 K/uL (ref 150–400)
RBC: 4.32 MIL/uL (ref 4.22–5.81)
RDW: 13.4 % (ref 11.5–15.5)
WBC: 7.2 K/uL (ref 4.0–10.5)
nRBC: 0 % (ref 0.0–0.2)

## 2024-09-26 LAB — TROPONIN T, HIGH SENSITIVITY: Troponin T High Sensitivity: 16 ng/L (ref 0–19)

## 2024-09-26 MED ORDER — CHLORHEXIDINE GLUCONATE CLOTH 2 % EX PADS
6.0000 | MEDICATED_PAD | Freq: Every day | CUTANEOUS | Status: DC
  Administered 2024-09-26 – 2024-09-27 (×2): 6 via TOPICAL

## 2024-09-26 MED ORDER — DOXYLAMINE SUCCINATE (SLEEP) 25 MG PO TABS
25.0000 mg | ORAL_TABLET | Freq: Once | ORAL | Status: AC
  Administered 2024-09-26: 25 mg via ORAL
  Filled 2024-09-26: qty 1

## 2024-09-26 MED ORDER — PERFLUTREN LIPID MICROSPHERE
1.0000 mL | INTRAVENOUS | Status: AC | PRN
  Administered 2024-09-26: 4 mL via INTRAVENOUS

## 2024-09-26 NOTE — Progress Notes (Signed)
 Echocardiogram 2D Echocardiogram has been performed.  Damien FALCON Itzell Bendavid RDCS 09/26/2024, 8:21 AM

## 2024-09-26 NOTE — Plan of Care (Signed)
" °  Problem: Education: Goal: Knowledge of General Education information will improve Description: Including pain rating scale, medication(s)/side effects and non-pharmacologic comfort measures Outcome: Not Progressing   Problem: Clinical Measurements: Goal: Ability to maintain clinical measurements within normal limits will improve Outcome: Progressing Goal: Will remain free from infection Outcome: Progressing   Problem: Nutrition: Goal: Adequate nutrition will be maintained Outcome: Progressing   Problem: Safety: Goal: Ability to remain free from injury will improve Outcome: Progressing   "

## 2024-09-26 NOTE — Plan of Care (Signed)
" °  Problem: Skin Integrity: Goal: Risk for impaired skin integrity will decrease Outcome: Progressing   Problem: Education: Goal: Understanding of cardiac disease, CV risk reduction, and recovery process will improve Outcome: Progressing Goal: Individualized Educational Video(s) Outcome: Progressing   Problem: Activity: Goal: Ability to tolerate increased activity will improve Outcome: Progressing   Problem: Cardiac: Goal: Ability to achieve and maintain adequate cardiovascular perfusion will improve Outcome: Progressing   Problem: Health Behavior/Discharge Planning: Goal: Ability to safely manage health-related needs after discharge will improve Outcome: Progressing   Problem: Activity: Goal: Ability to return to baseline activity level will improve Outcome: Progressing   Problem: Cardiovascular: Goal: Ability to achieve and maintain adequate cardiovascular perfusion will improve Outcome: Progressing   "

## 2024-09-27 ENCOUNTER — Encounter (HOSPITAL_COMMUNITY): Payer: Self-pay | Admitting: Cardiovascular Disease

## 2024-09-27 ENCOUNTER — Other Ambulatory Visit (HOSPITAL_COMMUNITY): Payer: Self-pay

## 2024-09-27 ENCOUNTER — Encounter (HOSPITAL_COMMUNITY): Admission: EM | Disposition: A | Payer: Self-pay | Source: Home / Self Care | Attending: Cardiovascular Disease

## 2024-09-27 ENCOUNTER — Telehealth (HOSPITAL_COMMUNITY): Payer: Self-pay

## 2024-09-27 ENCOUNTER — Other Ambulatory Visit: Payer: Self-pay

## 2024-09-27 ENCOUNTER — Ambulatory Visit

## 2024-09-27 DIAGNOSIS — I2510 Atherosclerotic heart disease of native coronary artery without angina pectoris: Secondary | ICD-10-CM | POA: Diagnosis not present

## 2024-09-27 HISTORY — PX: CORONARY STENT INTERVENTION: CATH118234

## 2024-09-27 LAB — CBC
HCT: 35.9 % — ABNORMAL LOW (ref 39.0–52.0)
Hemoglobin: 12.2 g/dL — ABNORMAL LOW (ref 13.0–17.0)
MCH: 28.6 pg (ref 26.0–34.0)
MCHC: 34 g/dL (ref 30.0–36.0)
MCV: 84.3 fL (ref 80.0–100.0)
Platelets: 222 K/uL (ref 150–400)
RBC: 4.26 MIL/uL (ref 4.22–5.81)
RDW: 13.6 % (ref 11.5–15.5)
WBC: 6.6 K/uL (ref 4.0–10.5)
nRBC: 0 % (ref 0.0–0.2)

## 2024-09-27 LAB — BASIC METABOLIC PANEL WITH GFR
Anion gap: 15 (ref 5–15)
BUN: 28 mg/dL — ABNORMAL HIGH (ref 8–23)
CO2: 20 mmol/L — ABNORMAL LOW (ref 22–32)
Calcium: 9.2 mg/dL (ref 8.9–10.3)
Chloride: 104 mmol/L (ref 98–111)
Creatinine, Ser: 1.26 mg/dL — ABNORMAL HIGH (ref 0.61–1.24)
GFR, Estimated: >60 mL/min
Glucose, Bld: 101 mg/dL — ABNORMAL HIGH (ref 70–99)
Potassium: 3.4 mmol/L — ABNORMAL LOW (ref 3.5–5.1)
Sodium: 139 mmol/L (ref 135–145)

## 2024-09-27 LAB — GLUCOSE, CAPILLARY
Glucose-Capillary: 108 mg/dL — ABNORMAL HIGH (ref 70–99)
Glucose-Capillary: 106 mg/dL — ABNORMAL HIGH (ref 70–99)
Glucose-Capillary: 83 mg/dL (ref 70–99)
Glucose-Capillary: 178 mg/dL — ABNORMAL HIGH (ref 70–99)

## 2024-09-27 LAB — POCT ACTIVATED CLOTTING TIME
Activated Clotting Time: 256 s
Activated Clotting Time: 286 s

## 2024-09-27 SURGERY — CORONARY STENT INTERVENTION
Anesthesia: LOCAL

## 2024-09-27 MED ORDER — HEPARIN SODIUM (PORCINE) 1000 UNIT/ML IJ SOLN
INTRAMUSCULAR | Status: DC | PRN
  Administered 2024-09-27: 8000 [IU] via INTRAVENOUS
  Administered 2024-09-27: 3000 [IU] via INTRAVENOUS

## 2024-09-27 MED ORDER — SODIUM CHLORIDE 0.9% FLUSH
3.0000 mL | Freq: Two times a day (BID) | INTRAVENOUS | Status: DC
  Administered 2024-09-27 – 2024-09-28 (×2): 3 mL via INTRAVENOUS

## 2024-09-27 MED ORDER — FENTANYL CITRATE (PF) 100 MCG/2ML IJ SOLN
INTRAMUSCULAR | Status: AC
  Filled 2024-09-27: qty 2

## 2024-09-27 MED ORDER — FREE WATER: 500.0000 mL | Freq: Once | Status: DC

## 2024-09-27 MED ORDER — IOHEXOL 350 MG/ML SOLN
INTRAVENOUS | Status: DC | PRN
  Administered 2024-09-27: 56 mL

## 2024-09-27 MED ORDER — HEPARIN SODIUM (PORCINE) 1000 UNIT/ML IJ SOLN
INTRAMUSCULAR | Status: AC
  Filled 2024-09-27: qty 10

## 2024-09-27 MED ORDER — LABETALOL HCL 5 MG/ML IV SOLN: 10.0000 mg | INTRAVENOUS | Status: AC | PRN

## 2024-09-27 MED ORDER — FENTANYL CITRATE (PF) 100 MCG/2ML IJ SOLN
INTRAMUSCULAR | Status: DC | PRN
  Administered 2024-09-27: 25 ug via INTRAVENOUS

## 2024-09-27 MED ORDER — MIDAZOLAM HCL 2 MG/2ML IJ SOLN
INTRAMUSCULAR | Status: AC
  Filled 2024-09-27: qty 2

## 2024-09-27 MED ORDER — SODIUM CHLORIDE 0.9 % IV SOLN: 250.0000 mL | INTRAVENOUS | Status: DC | PRN

## 2024-09-27 MED ORDER — VERAPAMIL HCL 2.5 MG/ML IV SOLN
INTRAVENOUS | Status: AC
  Filled 2024-09-27: qty 2

## 2024-09-27 MED ORDER — HYDRALAZINE HCL 20 MG/ML IJ SOLN: 10.0000 mg | INTRAMUSCULAR | Status: AC | PRN

## 2024-09-27 MED ORDER — MIDAZOLAM HCL (PF) 2 MG/2ML IJ SOLN
INTRAMUSCULAR | Status: DC | PRN
  Administered 2024-09-27: 1 mg via INTRAVENOUS

## 2024-09-27 MED ORDER — SODIUM CHLORIDE 0.9% FLUSH: 3.0000 mL | INTRAVENOUS | Status: DC | PRN

## 2024-09-27 MED ORDER — ASPIRIN 81 MG PO CHEW: 81.0000 mg | CHEWABLE_TABLET | ORAL | Status: AC

## 2024-09-27 MED ORDER — LIDOCAINE HCL (PF) 1 % IJ SOLN
INTRAMUSCULAR | Status: DC | PRN
  Administered 2024-09-27: 2 mL

## 2024-09-27 MED ORDER — LIDOCAINE HCL (PF) 1 % IJ SOLN
INTRAMUSCULAR | Status: AC
  Filled 2024-09-27: qty 30

## 2024-09-27 MED ORDER — HEPARIN (PORCINE) IN NACL 1000-0.9 UT/500ML-% IV SOLN
INTRAVENOUS | Status: DC | PRN
  Administered 2024-09-27: 1000 mL

## 2024-09-27 MED ORDER — VERAPAMIL HCL 2.5 MG/ML IV SOLN
INTRAVENOUS | Status: DC | PRN
  Administered 2024-09-27: 10 mL via INTRA_ARTERIAL

## 2024-09-27 MED ORDER — POTASSIUM CHLORIDE CRYS ER 20 MEQ PO TBCR
40.0000 meq | EXTENDED_RELEASE_TABLET | Freq: Once | ORAL | Status: AC
  Administered 2024-09-27: 40 meq via ORAL
  Filled 2024-09-27: qty 2

## 2024-09-27 SURGICAL SUPPLY — 14 items
BALLOON EMERGE MR 2.0X12 (BALLOONS) IMPLANT
BALLOON ~~LOC~~ EMERGE MR 2.25X12 (BALLOONS) IMPLANT
CATH LAUNCHER 5F AL1 (CATHETERS) IMPLANT
CATH LAUNCHER 6FR EBU3.5 (CATHETERS) IMPLANT
DEVICE RAD COMP TR BAND LRG (VASCULAR PRODUCTS) IMPLANT
ELECT DEFIB PAD ADLT CADENCE (PAD) IMPLANT
GLIDESHEATH SLEND SS 6F .021 (SHEATH) IMPLANT
GUIDEWIRE INQWIRE 1.5J.035X260 (WIRE) IMPLANT
KIT ENCORE 26 ADVANTAGE (KITS) IMPLANT
PACK CARDIAC CATHETERIZATION (CUSTOM PROCEDURE TRAY) ×1 IMPLANT
SET ATX-X65L (MISCELLANEOUS) IMPLANT
STENT ONYX FRONTIER 2.0X18 (Permanent Stent) IMPLANT
WIRE HI TORQ VERSACORE-J 145CM (WIRE) IMPLANT
WIRE RUNTHROUGH IZANAI 014 180 (WIRE) IMPLANT

## 2024-09-27 NOTE — Progress Notes (Signed)
 "  Rounding Note   Patient Name: Bryan Shepard. Date of Encounter: 09/27/2024  [facility] Cardiologist: Vishnu P Mallipeddi, MD   Subjective Denies chest pain or shortness of breath.  He does have back pain however.  Scheduled Meds:  aspirin  EC  81 mg Oral Daily   Chlorhexidine  Gluconate Cloth  6 each Topical Daily   enoxaparin  (LOVENOX ) injection  40 mg Subcutaneous Q24H   free water   500 mL Oral Once   insulin  aspart  0-15 Units Subcutaneous TID WC   rosuvastatin   20 mg Oral q1800   sodium chloride  flush  3 mL Intravenous Q12H   ticagrelor   90 mg Oral BID   Continuous Infusions:  PRN Meds: acetaminophen , morphine  injection, nitroGLYCERIN , ondansetron  (ZOFRAN ) IV, oxyCODONE , sodium chloride  flush   Vital Signs  Vitals:   09/27/24 0626 09/27/24 0700 09/27/24 0800 09/27/24 0900  BP:  110/72 109/85 127/74  Pulse:  (!) 112 89 89  Resp:  (!) 24 15 (!) 5  Temp: 98.1 F (36.7 C)     TempSrc: Oral     SpO2:  97% 98% 98%  Weight:      Height:       No intake or output data in the 24 hours ending 09/27/24 1018    09/26/2024    6:30 AM 09/22/2024   10:45 AM 09/15/2024    3:21 PM  Last 3 Weights  Weight (lbs) 174 lb 2.6 oz 178 lb 3.2 oz 183 lb  Weight (kg) 79 kg 80.831 kg 83.008 kg      Telemetry Sinus rhythm- Personally Reviewed  ECG  Normal sinus rhythm at 87 with inferolateral T wave inversion- Personally Reviewed  Physical Exam  GEN: No acute distress.   Neck: No JVD Cardiac: RRR, no murmurs, rubs, or gallops.  Respiratory: Clear to auscultation bilaterally. GI: Soft, nontender, non-distended  MS: No edema; No deformity. Neuro:  Nonfocal  Psych: Normal affect   Labs High Sensitivity Troponin:  No results for input(s): TROPONINIHS in the last 720 hours.  Recent Labs  Lab 08/30/24 0950 09/11/24 1850 09/11/24 2036 09/25/24 1817 09/25/24 2021  TRNPT 444* 128* 129* 273* 16       Chemistry Recent Labs  Lab 09/23/24 1049  09/25/24 1609 09/25/24 1817 09/26/24 0143 09/27/24 0805  NA 138   < > 137 139 139  K 4.3   < > 3.3* 3.8 3.4*  CL 99   < > 100 103 104  CO2 16*  --  21* 21* 20*  GLUCOSE 125*   < > 136* 96 101*  BUN 43*   < > 42* 37* 28*  CREATININE 1.48*   < > 1.45* 1.29* 1.26*  CALCIUM  9.7  --  9.5 9.0 9.2  PROT 6.8  --  6.4*  --   --   ALBUMIN  4.3  --  3.8  --   --   AST 41*  --  46*  --   --   ALT 66*  --  58*  --   --   ALKPHOS 108  --  93  --   --   BILITOT 0.8  --  0.5  --   --   GFRNONAA  --   --  53* >60 >60  ANIONGAP  --   --  16* 15 15   < > = values in this interval not displayed.    Lipids  Recent Labs  Lab 09/25/24 1817  CHOL 56  TRIG  76  HDL 23*  LDLCALC 18  CHOLHDL 2.5    Hematology Recent Labs  Lab 09/25/24 1817 09/26/24 0143 09/27/24 0805  WBC 8.9 7.2 6.6  RBC 4.43 4.32 4.26  HGB 12.9* 12.4* 12.2*  HCT 37.7* 37.0* 35.9*  MCV 85.1 85.6 84.3  MCH 29.1 28.7 28.6  MCHC 34.2 33.5 34.0  RDW 13.2 13.4 13.6  PLT 238 224 222   Thyroid  No results for input(s): TSH, FREET4 in the last 168 hours.  BNPNo results for input(s): BNP, PROBNP in the last 168 hours.  DDimer No results for input(s): DDIMER in the last 168 hours.   Radiology  ECHOCARDIOGRAM LIMITED Result Date: 09/26/2024    ECHOCARDIOGRAM LIMITED REPORT   Patient Name:   Bryan Shepard. Date of Exam: 09/26/2024 Medical Rec #:  [id number]         Height:       67.0 in Accession #:    [id number]        Weight:       174.2 lb Date of Birth:  03-24-1956          BSA:          1.907 m Patient Age:    68 years          BP:           112/66 mmHg Patient Gender: M                 HR:           78 bpm. Exam Location:  Inpatient Procedure: Limited Echo and Color Doppler (Both Spectral and Color Flow Doppler            were utilized during procedure). Indications:    Acute Ischemic Heart Disease i24.9  History:        Patient has prior history of Echocardiogram examinations, most                 recent 09/12/2024.  CAD, Prior CABG; Risk Factors:Hypertension,                 Diabetes, Dyslipidemia and Sleep Apnea.  Sonographer:    Damien Senior RDCS Referring Phys: [id number] MICHAEL COOPER  Sonographer Comments: Very poor apical window, recent CABG IMPRESSIONS  1. Left ventricular ejection fraction, by estimation, is 55 to 60%. The left ventricle has normal function. The left ventricle demonstrates regional wall motion abnormalities (see scoring diagram/findings for description).  2. Right ventricular systolic function was not well visualized.  3. The mitral valve is grossly normal. Trivial mitral valve regurgitation.  4. The aortic valve is tricuspid. Aortic valve regurgitation is not visualized. Aortic valve sclerosis/calcification is present, without any evidence of aortic stenosis.  5. The inferior vena cava is normal in size with <50% respiratory variability, suggesting right atrial pressure of 8 mmHg. Comparison(s): Changes from prior study are noted. EF similar but new wall motion abnormalities. FINDINGS  Left Ventricle: Left ventricular ejection fraction, by estimation, is 55 to 60%. The left ventricle has normal function. The left ventricle demonstrates regional wall motion abnormalities. Definity  contrast agent was given IV to delineate the left ventricular endocardial borders.  LV Wall Scoring: The basal inferolateral segment, basal anterolateral segment, and basal inferior segment are hypokinetic. Right Ventricle: Right ventricular systolic function was not well visualized. Pericardium: There is no evidence of pericardial effusion. Mitral Valve: The mitral valve is grossly normal. Trivial mitral valve regurgitation. Tricuspid Valve: The tricuspid valve is grossly normal.  Tricuspid valve regurgitation is not demonstrated. Aortic Valve: The aortic valve is tricuspid. Aortic valve regurgitation is not visualized. Aortic valve sclerosis/calcification is present, without any evidence of aortic stenosis. Venous: The inferior  vena cava is normal in size with less than 50% respiratory variability, suggesting right atrial pressure of 8 mmHg. Additional Comments: Color Doppler performed.  Joelle Cedars Tonleu Electronically signed by Joelle Cedars Ny Signature Date/Time: 09/26/2024/11:51:18 AM    Final    CARDIAC CATHETERIZATION Result Date: 09/25/2024 1.  Patent left main with no significant stenosis 2.  Patent LAD with severe proximal stenosis and continued patency of the LIMA to LAD graft 3.  Severely stenotic ramus intermedius with total occlusion of the saphenous vein graft 4.  Total occlusion of the OM1, and total occlusion of the saphenous vein graft, unsuccessful PCI attempts at both the native vessel and the SVG occlusion sites 5.  Total occlusion of the native RCA and total occlusion of the SVG to RCA, PDA collateralized by the left coronary artery 6.  Normal LVEDP Recommendations: Medical therapy and staged PCI of the native ramus intermedius, likely on Monday as long as the patient is clinically stable.  Findings and treatment plan discussed with the patient and his wife who is present today.    Cardiac Studies Cardiac catheterization/attempted PCI (09/25/2024)  Conclusion  1.  Patent left main with no significant stenosis 2.  Patent LAD with severe proximal stenosis and continued patency of the LIMA to LAD graft 3.  Severely stenotic ramus intermedius with total occlusion of the saphenous vein graft 4.  Total occlusion of the OM1, and total occlusion of the saphenous vein graft, unsuccessful PCI attempts at both the native vessel and the SVG occlusion sites 5.  Total occlusion of the native RCA and total occlusion of the SVG to RCA, PDA collateralized by the left coronary artery 6.  Normal LVEDP   Recommendations: Medical therapy and staged PCI of the native ramus intermedius, likely on Monday as long as the patient is clinically stable.  Findings and treatment plan discussed with the patient and his wife  who is present today.  Coronary Diagrams  Diagnostic Dominance: Right  Intervention      Patient Profile    Bryan Shepard. is a 68 y.o. male with recent CABG who is being seen 09/25/2024 for the evaluation of STEMI.    Assessment & Plan  1: CAD/STEMI-patient mated with chest pain and EKG changes consistent with inferolateral ST segment elevation.  He is 3 weeks status post CABG x 4 by Dr. Daniel.  She had a LIMA to LAD, vein to a ramus branch, OM1 and PDA.  She had urgent catheterization by Dr. Wonda yesterday revealing occluded veins with a patent LIMA and severe native vessel disease.  Dr. Wonda attempted to recanalize the native OM1 unsuccessfully.  Plan is to bring patient back today for attempt at ramus branch intervention.  Since his enzymes were elevated he is not on heparin .  He said no recurrent chest pain.  His potassium was 3.4 and I have given him 20 mill equivalents of K repletion.  2:HFpEF-patient had normal EF preop.  2D echo is pending.  He was on Jardiance  and torsemide .  His serum creatinine is improved from 1.45 down to 1.26.  3: Essential hypertension-blood pressure 131/69 currently not on antihypertensive medications.  4: Hyperlipidemia-on statin therapy.  LDL 18  Patient currently stable.  Plan for ramus branch intervention today.  Discussed with patient and daughter.  For questions or updates, please contact [facility] Please consult [email or web address] for contact info under       Signed, Dorn Lesches, MD  09/27/2024, 10:18 AM    "

## 2024-09-27 NOTE — Telephone Encounter (Signed)
 Pharmacy Patient Advocate Encounter  Insurance verification completed.    The patient is insured through [employer].     Ran test claim for Brilinta  90mg  tablet and the current 30 day co-pay is $9.99.   This test claim was processed through [facility]- copay amounts may vary at other pharmacies due to pharmacy/plan contracts, or as the patient moves through the different stages of their insurance plan.

## 2024-09-27 NOTE — H&P (View-Only) (Signed)
 "  Rounding Note   Patient Name: Bryan Shepard. Date of Encounter: 09/27/2024  [facility] Cardiologist: Vishnu P Mallipeddi, MD   Subjective Denies chest pain or shortness of breath.  He does have back pain however.  Scheduled Meds:  aspirin  EC  81 mg Oral Daily   Chlorhexidine  Gluconate Cloth  6 each Topical Daily   enoxaparin  (LOVENOX ) injection  40 mg Subcutaneous Q24H   free water   500 mL Oral Once   insulin  aspart  0-15 Units Subcutaneous TID WC   rosuvastatin   20 mg Oral q1800   sodium chloride  flush  3 mL Intravenous Q12H   ticagrelor   90 mg Oral BID   Continuous Infusions:  PRN Meds: acetaminophen , morphine  injection, nitroGLYCERIN , ondansetron  (ZOFRAN ) IV, oxyCODONE , sodium chloride  flush   Vital Signs  Vitals:   09/27/24 0626 09/27/24 0700 09/27/24 0800 09/27/24 0900  BP:  110/72 109/85 127/74  Pulse:  (!) 112 89 89  Resp:  (!) 24 15 (!) 5  Temp: 98.1 F (36.7 C)     TempSrc: Oral     SpO2:  97% 98% 98%  Weight:      Height:       No intake or output data in the 24 hours ending 09/27/24 1018    09/26/2024    6:30 AM 09/22/2024   10:45 AM 09/15/2024    3:21 PM  Last 3 Weights  Weight (lbs) 174 lb 2.6 oz 178 lb 3.2 oz 183 lb  Weight (kg) 79 kg 80.831 kg 83.008 kg      Telemetry Sinus rhythm- Personally Reviewed  ECG  Normal sinus rhythm at 87 with inferolateral T wave inversion- Personally Reviewed  Physical Exam  GEN: No acute distress.   Neck: No JVD Cardiac: RRR, no murmurs, rubs, or gallops.  Respiratory: Clear to auscultation bilaterally. GI: Soft, nontender, non-distended  MS: No edema; No deformity. Neuro:  Nonfocal  Psych: Normal affect   Labs High Sensitivity Troponin:  No results for input(s): TROPONINIHS in the last 720 hours.  Recent Labs  Lab 08/30/24 0950 09/11/24 1850 09/11/24 2036 09/25/24 1817 09/25/24 2021  TRNPT 444* 128* 129* 273* 16       Chemistry Recent Labs  Lab 09/23/24 1049  09/25/24 1609 09/25/24 1817 09/26/24 0143 09/27/24 0805  NA 138   < > 137 139 139  K 4.3   < > 3.3* 3.8 3.4*  CL 99   < > 100 103 104  CO2 16*  --  21* 21* 20*  GLUCOSE 125*   < > 136* 96 101*  BUN 43*   < > 42* 37* 28*  CREATININE 1.48*   < > 1.45* 1.29* 1.26*  CALCIUM  9.7  --  9.5 9.0 9.2  PROT 6.8  --  6.4*  --   --   ALBUMIN  4.3  --  3.8  --   --   AST 41*  --  46*  --   --   ALT 66*  --  58*  --   --   ALKPHOS 108  --  93  --   --   BILITOT 0.8  --  0.5  --   --   GFRNONAA  --   --  53* >60 >60  ANIONGAP  --   --  16* 15 15   < > = values in this interval not displayed.    Lipids  Recent Labs  Lab 09/25/24 1817  CHOL 56  TRIG  76  HDL 23*  LDLCALC 18  CHOLHDL 2.5    Hematology Recent Labs  Lab 09/25/24 1817 09/26/24 0143 09/27/24 0805  WBC 8.9 7.2 6.6  RBC 4.43 4.32 4.26  HGB 12.9* 12.4* 12.2*  HCT 37.7* 37.0* 35.9*  MCV 85.1 85.6 84.3  MCH 29.1 28.7 28.6  MCHC 34.2 33.5 34.0  RDW 13.2 13.4 13.6  PLT 238 224 222   Thyroid  No results for input(s): TSH, FREET4 in the last 168 hours.  BNPNo results for input(s): BNP, PROBNP in the last 168 hours.  DDimer No results for input(s): DDIMER in the last 168 hours.   Radiology  ECHOCARDIOGRAM LIMITED Result Date: 09/26/2024    ECHOCARDIOGRAM LIMITED REPORT   Patient Name:   Bryan Shepard. Date of Exam: 09/26/2024 Medical Rec #:  [id number]         Height:       67.0 in Accession #:    [id number]        Weight:       174.2 lb Date of Birth:  03-24-1956          BSA:          1.907 m Patient Age:    68 years          BP:           112/66 mmHg Patient Gender: M                 HR:           78 bpm. Exam Location:  Inpatient Procedure: Limited Echo and Color Doppler (Both Spectral and Color Flow Doppler            were utilized during procedure). Indications:    Acute Ischemic Heart Disease i24.9  History:        Patient has prior history of Echocardiogram examinations, most                 recent 09/12/2024.  CAD, Prior CABG; Risk Factors:Hypertension,                 Diabetes, Dyslipidemia and Sleep Apnea.  Sonographer:    Damien Senior RDCS Referring Phys: [id number] MICHAEL COOPER  Sonographer Comments: Very poor apical window, recent CABG IMPRESSIONS  1. Left ventricular ejection fraction, by estimation, is 55 to 60%. The left ventricle has normal function. The left ventricle demonstrates regional wall motion abnormalities (see scoring diagram/findings for description).  2. Right ventricular systolic function was not well visualized.  3. The mitral valve is grossly normal. Trivial mitral valve regurgitation.  4. The aortic valve is tricuspid. Aortic valve regurgitation is not visualized. Aortic valve sclerosis/calcification is present, without any evidence of aortic stenosis.  5. The inferior vena cava is normal in size with <50% respiratory variability, suggesting right atrial pressure of 8 mmHg. Comparison(s): Changes from prior study are noted. EF similar but new wall motion abnormalities. FINDINGS  Left Ventricle: Left ventricular ejection fraction, by estimation, is 55 to 60%. The left ventricle has normal function. The left ventricle demonstrates regional wall motion abnormalities. Definity  contrast agent was given IV to delineate the left ventricular endocardial borders.  LV Wall Scoring: The basal inferolateral segment, basal anterolateral segment, and basal inferior segment are hypokinetic. Right Ventricle: Right ventricular systolic function was not well visualized. Pericardium: There is no evidence of pericardial effusion. Mitral Valve: The mitral valve is grossly normal. Trivial mitral valve regurgitation. Tricuspid Valve: The tricuspid valve is grossly normal.  Tricuspid valve regurgitation is not demonstrated. Aortic Valve: The aortic valve is tricuspid. Aortic valve regurgitation is not visualized. Aortic valve sclerosis/calcification is present, without any evidence of aortic stenosis. Venous: The inferior  vena cava is normal in size with less than 50% respiratory variability, suggesting right atrial pressure of 8 mmHg. Additional Comments: Color Doppler performed.  Joelle Cedars Tonleu Electronically signed by Joelle Cedars Ny Signature Date/Time: 09/26/2024/11:51:18 AM    Final    CARDIAC CATHETERIZATION Result Date: 09/25/2024 1.  Patent left main with no significant stenosis 2.  Patent LAD with severe proximal stenosis and continued patency of the LIMA to LAD graft 3.  Severely stenotic ramus intermedius with total occlusion of the saphenous vein graft 4.  Total occlusion of the OM1, and total occlusion of the saphenous vein graft, unsuccessful PCI attempts at both the native vessel and the SVG occlusion sites 5.  Total occlusion of the native RCA and total occlusion of the SVG to RCA, PDA collateralized by the left coronary artery 6.  Normal LVEDP Recommendations: Medical therapy and staged PCI of the native ramus intermedius, likely on Monday as long as the patient is clinically stable.  Findings and treatment plan discussed with the patient and his wife who is present today.    Cardiac Studies Cardiac catheterization/attempted PCI (09/25/2024)  Conclusion  1.  Patent left main with no significant stenosis 2.  Patent LAD with severe proximal stenosis and continued patency of the LIMA to LAD graft 3.  Severely stenotic ramus intermedius with total occlusion of the saphenous vein graft 4.  Total occlusion of the OM1, and total occlusion of the saphenous vein graft, unsuccessful PCI attempts at both the native vessel and the SVG occlusion sites 5.  Total occlusion of the native RCA and total occlusion of the SVG to RCA, PDA collateralized by the left coronary artery 6.  Normal LVEDP   Recommendations: Medical therapy and staged PCI of the native ramus intermedius, likely on Monday as long as the patient is clinically stable.  Findings and treatment plan discussed with the patient and his wife  who is present today.  Coronary Diagrams  Diagnostic Dominance: Right  Intervention      Patient Profile    [facility]. is a 68 y.o. male with recent CABG who is being seen 09/25/2024 for the evaluation of STEMI.    Assessment & Plan  1: CAD/STEMI-patient mated with chest pain and EKG changes consistent with inferolateral ST segment elevation.  He is 3 weeks status post CABG x 4 by Dr. Daniel.  She had a LIMA to LAD, vein to a ramus branch, OM1 and PDA.  She had urgent catheterization by Dr. Wonda yesterday revealing occluded veins with a patent LIMA and severe native vessel disease.  Dr. Wonda attempted to recanalize the native OM1 unsuccessfully.  Plan is to bring patient back today for attempt at ramus branch intervention.  Since his enzymes were elevated he is not on heparin .  He said no recurrent chest pain.  His potassium was 3.4 and I have given him 20 mill equivalents of K repletion.  2:HFpEF-patient had normal EF preop.  2D echo is pending.  He was on Jardiance  and torsemide .  His serum creatinine is improved from 1.45 down to 1.26.  3: Essential hypertension-blood pressure 131/69 currently not on antihypertensive medications.  4: Hyperlipidemia-on statin therapy.  LDL 18  Patient currently stable.  Plan for ramus branch intervention today.  Discussed with patient and daughter.  For questions or updates, please contact [facility] Please consult [email or web address] for contact info under       Signed, Dorn Lesches, MD  09/27/2024, 10:18 AM    "

## 2024-09-27 NOTE — TOC Initial Note (Signed)
 Transition of Care (TOC) - Initial/Assessment Note    Patient Details  Name: Bryan Shepard. MRN: [id number] Date of Birth: 09-Oct-1955  Transition of Care [facility]) CM/SW Contact:    Justina Delcia Czar, RN Phone Number: [phone] 09/27/2024, 4:23 PM  Clinical Narrative:                 Spoke to pt at bedside. States he was independent pta. Wife takes him to appt.    Will arrange PCP hospital follow up appt.    Expected Discharge Plan: Home w Home Health Services Barriers to Discharge: Continued Medical Work up   Patient Goals and CMS Choice Patient states their goals for this hospitalization and ongoing recovery are:: wants to remain indepedent          Expected Discharge Plan and Services   Discharge Planning Services: CM Consult   Living arrangements for the past 2 months: Single Family Home                                      Prior Living Arrangements/Services Living arrangements for the past 2 months: Single Family Home Lives with:: Spouse Patient language and need for interpreter reviewed:: Yes Do you feel safe going back to the place where you live?: Yes      Need for Family Participation in Patient Care: No (Comment) Care giver support system in place?: Yes (comment)   Criminal Activity/Legal Involvement Pertinent to Current Situation/Hospitalization: No - Comment as needed  Activities of Daily Living   ADL Screening (condition at time of admission) Independently performs ADLs?: Yes (appropriate for developmental age) Is the patient deaf or have difficulty hearing?: Yes Does the patient have difficulty seeing, even when wearing glasses/contacts?: No Does the patient have difficulty concentrating, remembering, or making decisions?: No  Permission Sought/Granted Permission sought to share information with : Case Manager, Family Supports, PCP Permission granted to share information with : Yes, Verbal Permission Granted  Share Information with  NAME: Shawna Kiener  Permission granted to share info w AGENCY: Home Health, PCP, DME  Permission granted to share info w Relationship: wife  Permission granted to share info w Contact Information: [phone]  Emotional Assessment Appearance:: Appears stated age Attitude/Demeanor/Rapport: Engaged Affect (typically observed): Accepting Orientation: : Oriented to Self, Oriented to Place, Oriented to  Time, Oriented to Situation   Psych Involvement: No (comment)  Admission diagnosis:  STEMI (ST elevation myocardial infarction) (HCC) [I21.3] 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) [I21.21] Patient Active Problem List   Diagnosis Date Noted   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) 09/25/2024   Coronary artery disease 09/12/2024   Dyslipidemia 09/12/2024   Type 2 diabetes mellitus without complications (HCC) 09/12/2024   Shortness of breath 09/12/2024   Acute heart failure (HCC) 09/12/2024   Acute CHF (congestive heart failure) (HCC) 09/11/2024   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   NSTEMI (non-ST elevated myocardial infarction) (HCC) 08/29/2024   Noncompliance with diabetes treatment 01/01/2024   Chronic pain in left shoulder 10/03/2023   Alternating constipation and diarrhea 04/02/2023   CKD stage 3 due to type 2 diabetes mellitus (HCC) 01/01/2023   Diabetes mellitus (HCC) 12/13/2020   Hypertension associated with type 2 diabetes mellitus (HCC) 04/30/2020   Obesity (BMI 30.0-34.9) 04/30/2020   Family history of genetic mutation for hereditary nonpolyposis colorectal cancer (HNPCC)    Family history of prostate cancer  Family history of stomach cancer    History of CVA (cerebrovascular accident) 06/26/2015   Hyperlipidemia associated with type 2 diabetes mellitus (HCC) 06/26/2015   OSA on CPAP 06/26/2015   PCP:  Severa Rock HERO, FNP Pharmacy:   [facility] [phone] - [place],  - [address] [place] [address] Phone:  [phone] Fax: [phone]     Social Drivers of Health (SDOH) Social History: SDOH Screenings   Food Insecurity: No Food Insecurity (09/26/2024)  Housing: Low Risk (09/26/2024)  Transportation Needs: No Transportation Needs (09/26/2024)  Utilities: Not At Risk (09/26/2024)  Alcohol Screen: Low Risk (03/18/2024)  Depression (PHQ2-9): Low Risk (09/07/2024)  Financial Resource Strain: Low Risk (05/05/2024)  Physical Activity: Sufficiently Active (05/05/2024)  Recent Concern: Physical Activity - Insufficiently Active (03/18/2024)  Social Connections: Socially Integrated (09/26/2024)  Stress: No Stress Concern Present (05/05/2024)  Tobacco Use: Low Risk (09/26/2024)  Health Literacy: Adequate Health Literacy (03/18/2024)   SDOH Interventions:     Readmission Risk Interventions     No data to display

## 2024-09-27 NOTE — Interval H&P Note (Signed)
 History and Physical Interval Note:  09/27/2024 2:58 PM  Bryan Shepard Bryan Shepard.  has presented today for surgery, with the diagnosis of cad.  The various methods of treatment have been discussed with the patient and family. After consideration of risks, benefits and other options for treatment, the patient has consented to  Procedures: CORONARY STENT INTERVENTION (N/A) as a surgical intervention.  The patient's history has been reviewed, patient examined, no change in status, stable for surgery.  I have reviewed the patient's chart and labs.  Questions were answered to the patient's satisfaction.     Ozell Fell

## 2024-09-28 ENCOUNTER — Other Ambulatory Visit (HOSPITAL_COMMUNITY): Payer: Self-pay

## 2024-09-28 ENCOUNTER — Other Ambulatory Visit: Payer: Self-pay

## 2024-09-28 DIAGNOSIS — I2121 ST elevation (STEMI) myocardial infarction involving left circumflex coronary artery: Secondary | ICD-10-CM | POA: Diagnosis not present

## 2024-09-28 DIAGNOSIS — I4729 Other ventricular tachycardia: Secondary | ICD-10-CM | POA: Insufficient documentation

## 2024-09-28 LAB — GLUCOSE, CAPILLARY
Glucose-Capillary: 129 mg/dL — ABNORMAL HIGH (ref 70–99)
Glucose-Capillary: 162 mg/dL — ABNORMAL HIGH (ref 70–99)

## 2024-09-28 LAB — CBC
HCT: 36.7 % — ABNORMAL LOW (ref 39.0–52.0)
Hemoglobin: 12.3 g/dL — ABNORMAL LOW (ref 13.0–17.0)
MCH: 28.9 pg (ref 26.0–34.0)
MCHC: 33.5 g/dL (ref 30.0–36.0)
MCV: 86.2 fL (ref 80.0–100.0)
Platelets: 211 K/uL (ref 150–400)
RBC: 4.26 MIL/uL (ref 4.22–5.81)
RDW: 13.7 % (ref 11.5–15.5)
WBC: 7.6 K/uL (ref 4.0–10.5)
nRBC: 0 % (ref 0.0–0.2)

## 2024-09-28 LAB — BASIC METABOLIC PANEL WITH GFR
Anion gap: 14 (ref 5–15)
BUN: 26 mg/dL — ABNORMAL HIGH (ref 8–23)
CO2: 18 mmol/L — ABNORMAL LOW (ref 22–32)
Calcium: 9 mg/dL (ref 8.9–10.3)
Chloride: 106 mmol/L (ref 98–111)
Creatinine, Ser: 1.33 mg/dL — ABNORMAL HIGH (ref 0.61–1.24)
GFR, Estimated: 59 mL/min — ABNORMAL LOW
Glucose, Bld: 111 mg/dL — ABNORMAL HIGH (ref 70–99)
Potassium: 3.8 mmol/L (ref 3.5–5.1)
Sodium: 139 mmol/L (ref 135–145)

## 2024-09-28 MED ORDER — METOPROLOL TARTRATE 25 MG PO TABS
25.0000 mg | ORAL_TABLET | Freq: Two times a day (BID) | ORAL | 2 refills | Status: AC
  Filled 2024-09-28: qty 60, 30d supply, fill #0

## 2024-09-28 MED ORDER — NITROGLYCERIN 0.4 MG SL SUBL
0.4000 mg | SUBLINGUAL_TABLET | SUBLINGUAL | 2 refills | Status: DC | PRN
  Filled 2024-09-28: qty 25, 5d supply, fill #0

## 2024-09-28 MED ORDER — TICAGRELOR 90 MG PO TABS
90.0000 mg | ORAL_TABLET | Freq: Two times a day (BID) | ORAL | 2 refills | Status: AC
  Filled 2024-09-28: qty 60, 30d supply, fill #0

## 2024-09-28 MED ORDER — ASPIRIN 81 MG PO TBEC
81.0000 mg | DELAYED_RELEASE_TABLET | Freq: Every day | ORAL | 2 refills | Status: DC
  Filled 2024-09-28: qty 90, 90d supply, fill #0

## 2024-09-28 MED ORDER — METOPROLOL TARTRATE 25 MG PO TABS
25.0000 mg | ORAL_TABLET | Freq: Two times a day (BID) | ORAL | Status: DC
  Administered 2024-09-28: 25 mg via ORAL
  Filled 2024-09-28: qty 1

## 2024-09-28 MED ORDER — EMPAGLIFLOZIN 10 MG PO TABS
10.0000 mg | ORAL_TABLET | Freq: Every day | ORAL | Status: DC
  Administered 2024-09-28: 10 mg via ORAL
  Filled 2024-09-28: qty 1

## 2024-09-28 MED ORDER — POTASSIUM CHLORIDE CRYS ER 20 MEQ PO TBCR
20.0000 meq | EXTENDED_RELEASE_TABLET | Freq: Once | ORAL | Status: AC
  Administered 2024-09-28: 20 meq via ORAL
  Filled 2024-09-28: qty 1

## 2024-09-28 NOTE — Plan of Care (Signed)
 Request via staffing to transition to intermediate level of care as the ICU is at critical capacity.  Chart reviewed, status post ramus intervention today without recurrent chest discomfort.  Has been hemodynamically stable.  Will plan on making intermediate level care.

## 2024-09-28 NOTE — Progress Notes (Signed)
 Reviewed AVS, patient expressed understanding of medications, MD follow up reviewed.   Removed IV, Site clean, dry and intact.  See LDA for information on wounds at discharge.  Patient states all belongings brought to the hospital at time of admission are accounted for and packed to take home.  Picked up medications from [facility] pharmacy.   [facility] contacted to transport patient to entrance A, where family member was waiting in vehicle to transport home.

## 2024-09-28 NOTE — Discharge Summary (Addendum)
 " Discharge Summary   Patient ID: Bryan Shepard. MRN: [id number]; DOB: 12-24-1955  Admit date: 09/25/2024 Discharge date: 09/28/2024  PCP:  Severa Rock HERO, FNP   [facility] Providers Cardiologist:  Diannah SHAUNNA Maywood, MD     Discharge Diagnoses  Principal Problem:   STEMI involving left circumflex coronary artery [facility]) Active Problems:   Hyperlipidemia associated with type 2 diabetes mellitus (HCC)   Hypertension associated with type 2 diabetes mellitus (HCC)   S/P CABG x 4   Type 2 diabetes mellitus without complications (HCC)   NSVT (nonsustained ventricular tachycardia) (HCC)  Diagnostic Studies/Procedures   Cath: 09/25/2024  1.  Patent left main with no significant stenosis 2.  Patent LAD with severe proximal stenosis and continued patency of the LIMA to LAD graft 3.  Severely stenotic ramus intermedius with total occlusion of the saphenous vein graft 4.  Total occlusion of the OM1, and total occlusion of the saphenous vein graft, unsuccessful PCI attempts at both the native vessel and the SVG occlusion sites 5.  Total occlusion of the native RCA and total occlusion of the SVG to RCA, PDA collateralized by the left coronary artery 6.  Normal LVEDP   Recommendations: Medical therapy and staged PCI of the native ramus intermedius, likely on Monday as long as the patient is clinically stable.  Findings and treatment plan discussed with the patient and his wife who is present today.  Diagnostic Dominance: Right  Intervention   Cath: 09/27/2024  Successful PCI and drug-eluting stent implantation in the native ramus intermedius branch with a 2.0 x 18 mm Onyx DES, reducing 95% stenosis to 0% post PCI with TIMI-3 flow  Patency of the recently occluded saphenous vein graft to OM with antegrade flow into the obtuse marginal branch, also supplying collaterals to the distal RCA territory. There is a moderate lesion in the proximal body of the graft and a severe  distal anastomotic lesion, but TIMI-3 flow in this graft that was previously occluded.    Plan: med Rx, DC home tomorrow, consider staged PCI of the distal SVG-OM in 6-8 weeks, depending on the patient's symptoms/presence of angina.  Diagnostic Dominance: Right  Intervention   _____________   History of Present Illness   Bryan Kahre. is a 68 y.o. male with recent CABG, HTN, HLD, and DM who was seen 09/25/2024 for the evaluation of STEMI. 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 He has had some problems since surgery, mostly with fatigue and some episodes of heart palpitations. The day of admission, he developed acute onset of back and chest pain and EMS was called. A code STEMI was called in the field after his EKG confirmed acute inferolateral ST elevation. The patient was transported directly to the cardiac catheterization lab where he complains of ongoing 5/10 chest pain. He described the pain as a pressure-like sensation. He had some shortness of breath that is unchanged. No diaphoresis, nausea, or vomiting. The patient had been compliant with his medications.   Hospital Course    STEMI -- Underwent initial cardiac catheterization on 12/20 with patent LIMA to LAD, severely stenotic ramus intermediate with total occlusion of vein graft, total occlusion of OM1 and vein graft with unsuccessful PCI attempts at native vessel and SVG occlusion sites.  Was recommended for staged PCI of native ramus intermediate. --  Brought back to the Cath Lab on 12/22 and underwent successful PCI/DES x 1 to native ramus intermediate with TIMI III flow.  Consideration of staged PCI of distal SVG to OM in 6 to 8 weeks depending on symptoms/presence of angina. Recommendations of DAPT with aspirin /Brilinta  for at least 1 year.  HFpEF --Echo 12/21  with LVEF of 55 to 60%, RV function not well-visualized, trivial MR -- Continue Jardiance , metoprolol  25 mg twice daily  NSVT -- Brief episodes on telemetry, resumed on metoprolol  at 25 mg twice daily at discharge  Hypertension --Controlled -- Continue metoprolol  25 mg twice daily  Hyperlipidemia -- LDL 18, HDL 23 -- Continue Crestor  20 mg daily  Diabetes -- Hemoglobin A1c 7.0 -- Resume metformin  and Jardiance  at discharge  Patient was seen by Dr. Court and deemed stable for discharge home. Follow up arranged in the office. Medications sent to the [facility] pharmacy and educated by pharmD prior to discharge.     Did the patient have an acute coronary syndrome (MI, NSTEMI, STEMI, etc) this admission?:  Yes                               AHA/ACC ACS Clinical Performance & Quality Measures: Aspirin  prescribed? - Yes ADP Receptor Inhibitor (Plavix /Clopidogrel , Brilinta /Ticagrelor  or Effient/Prasugrel) prescribed (includes medically managed patients)? - Yes Beta Blocker prescribed? - Yes High Intensity Statin (Lipitor 40-80mg  or Crestor  20-40mg ) prescribed? - Yes EF assessed during THIS hospitalization? - Yes For EF <40%, was ACEI/ARB prescribed? - Not Applicable (EF >/= 40%) For EF <40%, Aldosterone Antagonist (Spironolactone or Eplerenone) prescribed? - Not Applicable (EF >/= 40%) Cardiac Rehab Phase II ordered (including medically managed patients)? - Yes       The patient will be scheduled for a TOC follow up appointment in 10-14 days.  A message has been sent to the [facility] and Scheduling Pool at the office where the patient should be seen for follow up.  _____________  Discharge Vitals Blood pressure 111/71, pulse 93, temperature 97.7 F (36.5 C), temperature source Axillary, resp. rate 20, height 5' 7 (1.702 m), weight 78.7 kg, SpO2 97%.  Filed Weights   09/26/24 0630 09/28/24 0302  Weight: 79 kg 78.7 kg    Labs & Radiologic Studies  CBC Recent Labs    09/25/24 1817  09/26/24 0143 09/27/24 0805 09/28/24 0457  WBC 8.9   < > 6.6 7.6  NEUTROABS 6.8  --   --   --   HGB 12.9*   < > 12.2* 12.3*  HCT 37.7*   < > 35.9* 36.7*  MCV 85.1   < > 84.3 86.2  PLT 238   < > 222 211   < > = values in this interval not displayed.   Basic Metabolic Panel Recent Labs    87/77/74 0805 09/28/24 0457  NA 139 139  K 3.4* 3.8  CL 104 106  CO2 20* 18*  GLUCOSE 101* 111*  BUN 28* 26*  CREATININE 1.26* 1.33*  CALCIUM  9.2 9.0   Liver Function Tests Recent Labs    09/25/24 1817  AST 46*  ALT 58*  ALKPHOS 93  BILITOT 0.5  PROT 6.4*  ALBUMIN  3.8   No results for input(s): LIPASE, AMYLASE in the last 72 hours. High Sensitivity Troponin:   No results for input(s): TROPONINIHS in the last 720 hours.  Recent Labs  Lab 08/30/24 [phone] 09/11/24 1850 09/11/24 2036 09/25/24 1817 09/25/24 2021  TRNPT 444* 128* 129* 273* 16    BNP Invalid input(s): POCBNP No results for input(s): PROBNP in the last 72 hours.  No results for input(s): BNP in the last 72 hours.  D-Dimer No results for input(s): DDIMER in the last 72 hours. Hemoglobin A1C Recent Labs    09/25/24 1817  HGBA1C 7.0*   Fasting Lipid Panel Recent Labs    09/25/24 1817  CHOL 56  HDL 23*  LDLCALC 18  TRIG 76  CHOLHDL 2.5   Lipoprotein (a)  Date/Time Value Ref Range Status  08/31/2024 03:42 AM <8.4 <75.0 nmol/L Final    Comment:    (NOTE) **Results verified by repeat testing** This test was developed and its performance characteristics determined by [facility]. It has not been cleared or approved by the Food and Drug Administration. Note:  Values greater than or equal to 75.0 nmol/L may       indicate an independent risk factor for CHD,       but must be evaluated with caution when applied       to non-Caucasian populations due to the       influence of genetic factors on Lp(a) across       ethnicities. Performed At: [facility] [address] [place], [place]  [id number] Jennette Shorter MD Ey:[id number]     Thyroid  Function Tests No results for input(s): TSH, T4TOTAL, T3FREE, THYROIDAB in the last 72 hours.  Invalid input(s): FREET3 _____________  CARDIAC CATHETERIZATION Result Date: 09/27/2024 Successful PCI and drug-eluting stent implantation in the native ramus intermedius branch with a 2.0 x 18 mm Onyx DES, reducing 95% stenosis to 0% post PCI with TIMI-3 flow 2.  Patency of the recently occluded saphenous vein graft to OM with antegrade flow into the obtuse marginal branch, also supplying collaterals to the distal RCA territory. There is a moderate lesion in the proximal body of the graft and a severe distal anastomotic lesion, but TIMI-3 flow in this graft that was previously occluded. Plan: med Rx, DC home tomorrow, consider staged PCI of the distal SVG-OM in 6-8 weeks, depending on the patient's symptoms/presence of angina.   ECHOCARDIOGRAM LIMITED Result Date: 09/26/2024    ECHOCARDIOGRAM LIMITED REPORT   Patient Name:   Bryan Falkner. Date of Exam: 09/26/2024 Medical Rec #:  [id number]         Height:       67.0 in Accession #:    [id number]        Weight:       174.2 lb Date of Birth:  10-21-1955          BSA:          1.907 m Patient Age:    68 years          BP:           112/66 mmHg Patient Gender: M                 HR:           78 bpm. Exam Location:  Inpatient Procedure: Limited Echo and Color Doppler (Both Spectral and Color Flow Doppler            were utilized during procedure). Indications:    Acute Ischemic Heart Disease i24.9  History:        Patient has prior history of Echocardiogram examinations, most                 recent 09/12/2024. CAD,  Prior CABG; Risk Factors:Hypertension,                 Diabetes, Dyslipidemia and Sleep Apnea.  Sonographer:    Bryan Shepard RDCS Referring Phys: [phone] Bryan Shepard  Sonographer Comments: Very poor apical window, recent CABG IMPRESSIONS  1. Left ventricular ejection fraction, by  estimation, is 55 to 60%. The left ventricle has normal function. The left ventricle demonstrates regional wall motion abnormalities (see scoring diagram/findings for description).  2. Right ventricular systolic function was not well visualized.  3. The mitral valve is grossly normal. Trivial mitral valve regurgitation.  4. The aortic valve is tricuspid. Aortic valve regurgitation is not visualized. Aortic valve sclerosis/calcification is present, without any evidence of aortic stenosis.  5. The inferior vena cava is normal in size with <50% respiratory variability, suggesting right atrial pressure of 8 mmHg. Comparison(s): Changes from prior study are noted. EF similar but new wall motion abnormalities. FINDINGS  Left Ventricle: Left ventricular ejection fraction, by estimation, is 55 to 60%. The left ventricle has normal function. The left ventricle demonstrates regional wall motion abnormalities. Definity  contrast agent was given IV to delineate the left ventricular endocardial borders.  LV Wall Scoring: The basal inferolateral segment, basal anterolateral segment, and basal inferior segment are hypokinetic. Right Ventricle: Right ventricular systolic function was not well visualized. Pericardium: There is no evidence of pericardial effusion. Mitral Valve: The mitral valve is grossly normal. Trivial mitral valve regurgitation. Tricuspid Valve: The tricuspid valve is grossly normal. Tricuspid valve regurgitation is not demonstrated. Aortic Valve: The aortic valve is tricuspid. Aortic valve regurgitation is not visualized. Aortic valve sclerosis/calcification is present, without any evidence of aortic stenosis. Venous: The inferior vena cava is normal in size with less than 50% respiratory variability, suggesting right atrial pressure of 8 mmHg. Additional Comments: Color Doppler performed.  Bryan Shepard Electronically signed by Bryan C[place] Ny Signature Date/Time: 09/26/2024/11:51:18 AM    Final     CARDIAC CATHETERIZATION Result Date: 09/25/2024 1.  Patent left main with no significant stenosis 2.  Patent LAD with severe proximal stenosis and continued patency of the LIMA to LAD graft 3.  Severely stenotic ramus intermedius with total occlusion of the saphenous vein graft 4.  Total occlusion of the OM1, and total occlusion of the saphenous vein graft, unsuccessful PCI attempts at both the native vessel and the SVG occlusion sites 5.  Total occlusion of the native RCA and total occlusion of the SVG to RCA, PDA collateralized by the left coronary artery 6.  Normal LVEDP Recommendations: Medical therapy and staged PCI of the native ramus intermedius, likely on Monday as long as the patient is clinically stable.  Findings and treatment plan discussed with the patient and his wife who is present today.   DG Chest 2 View Result Date: 09/15/2024 EXAM: 2 VIEW(S) XRAY OF THE CHEST 09/15/2024 03:18:00 PM COMPARISON: 4 days ago status post coronary artery bypass graft. CLINICAL HISTORY: cabg FINDINGS: LUNGS AND PLEURA: No focal pulmonary opacity. No pleural effusion. No pneumothorax. HEART AND MEDIASTINUM: No acute abnormality of the cardiac and mediastinal silhouettes. BONES AND SOFT TISSUES: No acute osseous abnormality. IMPRESSION: 1. No acute cardiopulmonary process. Electronically signed by: Bryan Seip MD 09/15/2024 03:39 PM EST RP Workstation: [id number]   ECHOCARDIOGRAM COMPLETE Result Date: 09/12/2024    ECHOCARDIOGRAM REPORT   Patient Name:   Bryan Saling. Date of Exam: 09/12/2024 Medical Rec #:  [id number]         Height:  67.0 in Accession #:    [id number]        Weight:       197.4 lb Date of Birth:  [birth year]          BSA:          2.011 m Patient Age:    68 years          BP:           118/71 mmHg Patient Gender: M                 HR:           86 bpm. Exam Location:  [facility] Procedure: 2D Echo, Cardiac Doppler, Color Doppler and Intracardiac            Opacification Agent (Both  Spectral and Color Flow Doppler were            utilized during procedure). Indications:    CHF I50.31  History:        Patient has prior history of Echocardiogram examinations, most                 recent 08/30/2024. Stroke; Risk Factors:Diabetes.  Sonographer:    Bryan Shepard RDCS Referring Phys: [id number] JAN A MANSY IMPRESSIONS  1. Left ventricular ejection fraction, by estimation, is 55 to 60%. The left ventricle has normal function. The left ventricle has no regional wall motion abnormalities. There is moderate left ventricular hypertrophy. Left ventricular diastolic parameters are consistent with Grade I diastolic dysfunction (impaired relaxation).  2. Right ventricular systolic function is normal. The right ventricular size is normal.  3. Left atrial size was mildly dilated.  4. The mitral valve is abnormal. Trivial mitral valve regurgitation. No evidence of mitral stenosis.  5. The aortic valve is tricuspid. There is moderate calcification of the aortic valve. There is moderate thickening of the aortic valve. Aortic valve regurgitation is not visualized. Aortic valve sclerosis is present, with no evidence of aortic valve stenosis.  6. The inferior vena cava is normal in size with greater than 50% respiratory variability, suggesting right atrial pressure of 3 mmHg. FINDINGS  Left Ventricle: Left ventricular ejection fraction, by estimation, is 55 to 60%. The left ventricle has normal function. The left ventricle has no regional wall motion abnormalities. Strain was performed and the global longitudinal strain is indeterminate. The left ventricular internal cavity size was normal in size. There is moderate left ventricular hypertrophy. Left ventricular diastolic parameters are consistent with Grade I diastolic dysfunction (impaired relaxation). Right Ventricle: The right ventricular size is normal. No increase in right ventricular wall thickness. Right ventricular systolic function is normal. Left Atrium: Left  atrial size was mildly dilated. Right Atrium: Right atrial size was normal in size. Pericardium: There is no evidence of pericardial effusion. Mitral Valve: The mitral valve is abnormal. There is mild thickening of the mitral valve leaflet(s). There is mild calcification of the mitral valve leaflet(s). Mild mitral annular calcification. Trivial mitral valve regurgitation. No evidence of mitral valve stenosis. Tricuspid Valve: The tricuspid valve is normal in structure. Tricuspid valve regurgitation is not demonstrated. No evidence of tricuspid stenosis. Aortic Valve: The aortic valve is tricuspid. There is moderate calcification of the aortic valve. There is moderate thickening of the aortic valve. Aortic valve regurgitation is not visualized. Aortic valve sclerosis is present, with no evidence of aortic valve stenosis. Pulmonic Valve: The pulmonic valve was normal in structure. Pulmonic valve regurgitation is trivial. No evidence of pulmonic  stenosis. Aorta: The aortic root is normal in size and structure. Venous: The inferior vena cava is normal in size with greater than 50% respiratory variability, suggesting right atrial pressure of 3 mmHg. IAS/Shunts: No atrial level shunt detected by color flow Doppler. Additional Comments: 3D was performed not requiring image post processing on an independent workstation and was indeterminate.  LEFT VENTRICLE PLAX 2D LVIDd:         2.30 cm     Diastology LVIDs:         1.90 cm     LV e' medial:    5.22 cm/s LV PW:         1.40 cm     LV E/e' medial:  12.5 LV IVS:        1.40 cm     LV e' lateral:   11.40 cm/s LVOT diam:     2.20 cm     LV E/e' lateral: 5.7 LV SV:         45 LV SV Index:   22 LVOT Area:     3.80 cm LV IVRT:       114 msec  LV Volumes (MOD) LV vol d, MOD A2C: 29.7 ml LV vol d, MOD A4C: 35.7 ml LV vol s, MOD A2C: 11.3 ml LV vol s, MOD A4C: 11.8 ml LV SV MOD A2C:     18.4 ml LV SV MOD A4C:     35.7 ml LV SV MOD BP:      21.9 ml RIGHT VENTRICLE            IVC RV  S prime:     5.74 cm/s  IVC diam: 0.80 cm LEFT ATRIUM             Index        RIGHT ATRIUM           Index LA diam:        3.70 cm 1.84 cm/m   RA Area:     11.10 cm LA Vol (A2C):   20.2 ml 10.04 ml/m  RA Volume:   20.10 ml  9.99 ml/m LA Vol (A4C):   23.7 ml 11.78 ml/m LA Biplane Vol: 21.7 ml 10.79 ml/m  AORTIC VALVE LVOT Vmax:   76.50 cm/s LVOT Vmean:  51.500 cm/s LVOT VTI:    0.118 m  AORTA Ao Root diam: 3.10 cm Ao Asc diam:  3.60 cm MITRAL VALVE MV Area (PHT): 3.45 cm    SHUNTS MV Decel Time: 220 msec    Systemic VTI:  0.12 m MV E velocity: 65.50 cm/s  Systemic Diam: 2.20 cm MV A velocity: 89.20 cm/s MV E/A ratio:  0.73 Bryan Emmer MD Electronically signed by Bryan Emmer MD Signature Date/Time: 09/12/2024/12:20:30 PM    Final    CT Angio Chest PE W and/or Wo Contrast Result Date: 09/11/2024 EXAM: CTA of the Chest with contrast for PE 09/11/2024 07:50:59 PM TECHNIQUE: CTA of the chest was performed without and with the administration of 75 mL of iohexol  (OMNIPAQUE ) 350 MG/ML injection. Multiplanar reformatted images are provided for review. MIP images are provided for review. Automated exposure control, iterative reconstruction, and/or weight based adjustment of the mA/kV was utilized to reduce the radiation dose to as low as reasonably achievable. COMPARISON: 08/29/2024 CLINICAL HISTORY: Pulmonary embolism (PE) suspected, high prob. FINDINGS: PULMONARY ARTERIES: Pulmonary arteries are adequately opacified for evaluation. No pulmonary embolism. Main pulmonary artery is normal in caliber. MEDIASTINUM: The heart demonstrates prior CABG. Aortic  atherosclerosis. The pericardium demonstrates no acute abnormality. LYMPH NODES: No mediastinal, hilar or axillary lymphadenopathy. LUNGS AND PLEURA: Small left pleural effusion with left lower lobe atelectasis. No pneumothorax. UPPER ABDOMEN: Limited images of the upper abdomen are unremarkable. SOFT TISSUES AND BONES: No acute bone or soft tissue abnormality.  IMPRESSION: 1. No pulmonary embolism. 2. Small left pleural effusion with left lower lobe atelectasis. Electronically signed by: Bryan Crease MD 09/11/2024 08:00 PM EST RP Workstation: [id number]   DG Chest Portable 1 View Result Date: 09/11/2024 EXAM: 1 VIEW(S) XRAY OF THE CHEST 09/11/2024 06:24:00 PM COMPARISON: 6 days ago status post coronary artery bypass graft stable cardiomediastinal silhouette. CLINICAL HISTORY: dyspnea FINDINGS: LUNGS AND PLEURA: No focal pulmonary opacity. No pleural effusion. No pneumothorax. HEART AND MEDIASTINUM: No acute abnormality of the cardiac and mediastinal silhouettes. BONES AND SOFT TISSUES: No acute osseous abnormality. IMPRESSION: 1. No acute cardiopulmonary process. Electronically signed by: Bryan Seip MD 09/11/2024 06:32 PM EST RP Workstation: [id number]   DG Chest 2 View Result Date: 09/05/2024 CLINICAL DATA:  Status post CABG and chest tube removal EXAM: CHEST - 2 VIEW COMPARISON:  Chest radiograph dated 09/04/2024 FINDINGS: Lines/tubes: Interval removal of left basilar pleural catheter. Lungs: Low lung volumes with bronchovascular crowding. Hazy and linear left basilar opacity. Pleura: No definite pneumothorax. Trace blunting of the left costophrenic angle. Heart/mediastinum: Similar enlarged, postsurgical cardiomediastinal silhouette. Bones: Median sternotomy wires are nondisplaced. IMPRESSION: 1. Interval removal of left basilar pleural catheter. No definite pneumothorax. 2. Trace blunting of the left costophrenic angle, which may represent a small pleural effusion. 3. Hazy and linear left basilar opacity, likely atelectasis. Electronically Signed   By: Bryan  Shepard M.D.   On: 09/05/2024 11:36   DG Chest 2 View Result Date: 09/04/2024 CLINICAL DATA:  Status post CABG EXAM: CHEST - 2 VIEW COMPARISON:  Chest radiograph dated 09/03/2024 FINDINGS: Lines/tubes: Interval removal of right IJ sheath and mediastinal catheters. Left basilar pleural catheter remains.  Lungs: Low lung volumes with bronchovascular crowding. Bibasilar hazy opacities. Pleura: Trace blunting of left costophrenic angle.  No pneumothorax. Heart/mediastinum: Similar mildly enlarged postsurgical cardiomediastinal silhouette. Bones: Median sternotomy wires are nondisplaced. IMPRESSION: 1. Interval removal of right IJ sheath and mediastinal catheters. 2. Low lung volumes with bibasilar hazy opacities, likely atelectasis. 3. Trace left pleural effusion. Electronically Signed   By: Bryan  Shepard M.D.   On: 09/04/2024 13:13   DG Chest Port 1 View Result Date: 09/03/2024 EXAM: 1 VIEW XRAY OF THE CHEST 09/03/2024 05:37:00 AM COMPARISON: Portable chest yesterday at 5:25 am. CLINICAL HISTORY: Status post cardiac surgery FINDINGS: LINES, TUBES AND DEVICES: Pericardial and mediastinal drains remain in place. There is a right IJ catheter through an introducer sheath with the tip in the upper SVC. LUNGS AND PLEURA: No measurable pneumothorax. Linear and patchy left basilar opacity appears similar and could all be atelectasis or could be a combination of atelectasis and consolidation. No new or worsening opacity is seen. The rest of the lungs are generally clear apart from minimal pleural effusions. HEART AND MEDIASTINUM: Stable cardiomegaly. Stable mediastinal widening with aortic atherosclerosis. BONES AND SOFT TISSUES: No acute osseous abnormality. Sternotomy and CABG changes are again noted. IMPRESSION: 1. Stable cardiomegaly and mediastinal widening with aortic atherosclerosis. 2. Unchanged left basilar opacity, favored atelectasis versus atelectasis with superimposed consolidation. 3. Minimal pleural effusions. Electronically signed by: Francis Quam MD 09/03/2024 06:58 AM EST RP Workstation: [id number]   ECHO INTRAOPERATIVE TEE Result Date: 09/02/2024  *INTRAOPERATIVE TRANSESOPHAGEAL REPORT *  Patient Name:  Bryan FORBES [name] Date of Exam: 09/01/2024 Medical Rec #:  [id number]         Height:       67.0 in  Accession #:    [id number]        Weight:       199.3 lb Date of Birth:  1955/11/23          BSA:          2.02 m Patient Age:    68 years          BP:           111/59 mmHg Patient Gender: M                 HR:           76 bpm. Exam Location:  Anesthesiology Transesophogeal exam was perform intraoperatively during surgical procedure. Patient was closely monitored under general anesthesia during the entirety of examination. Indications:     CAD Performing Phys: [id number] BAILEY S SU Complications: No known complications during this procedure. POST-OP IMPRESSIONS _ Left Ventricle: The left ventricle is unchanged from pre-bypass. The wall motion is Improved RWMA seen in preop exam. _ Right Ventricle: mildly reduced function. The cavity was normal. _ Left Atrial Appendage: The left atrial appendage appears unchanged from pre-bypass. _ Aortic Valve: The aortic valve appears unchanged from pre-bypass. _ Mitral Valve: The mitral valve appears unchanged from pre-bypass. _ Tricuspid Valve: The tricuspid valve appears unchanged from pre-bypass. _ Comments: Limited images post-op. PRE-OP FINDINGS  Left Ventricle: The left ventricle has normal systolic function, with an ejection fraction of 55-60%. The cavity size was normal. There is mild concentric left ventricular hypertrophy. Right Ventricle: The right ventricle has normal systolic function. The cavity was normal. There is no increase in right ventricular wall thickness. Left Atrium: Left atrial size was normal in size. No left atrial/left atrial appendage thrombus was detected. Right Atrium: Right atrial size was normal in size. Interatrial Septum: No atrial level shunt detected by color flow Doppler. Pericardium: There is no evidence of pericardial effusion. Mitral Valve: The mitral valve is normal in structure. Mitral valve regurgitation is moderate by color flow Doppler. The MR jet is centrally-directed. There is no evidence of mitral valve vegetation. Tricuspid Valve:  The tricuspid valve was normal in structure. Tricuspid valve regurgitation is trivial by color flow Doppler. Aortic Valve: The aortic valve is tricuspid Aortic valve regurgitation was not visualized by color flow Doppler. There is no stenosis of the aortic valve. Pulmonic Valve: The pulmonic valve was normal in structure. Pulmonic valve regurgitation is trivial by color flow Doppler.  Bryan Pope MD Electronically signed by Bryan Pope MD Signature Date/Time: 09/02/2024/9:28:33 AM    Final    DG Chest Port 1 View Result Date: 09/02/2024 EXAM: 1 VIEW XRAY OF THE CHEST 09/02/2024 05:30:00 AM COMPARISON: 09/01/2024 CLINICAL HISTORY: 241121 S/P CABG x 4 241121 FINDINGS: LINES, TUBES AND DEVICES: Endotracheal tube and nasogastric tube removed. Stable right IJ central venous catheter with tip projecting over the superior vena cava. Stable mediastinal drain and left chest tube. LUNGS AND PLEURA: Low lung volumes. Persistent left retrocardiac airspace opacity. Small left pleural effusion. probable HEART AND MEDIASTINUM: CABG markers noted. BONES AND SOFT TISSUES: Sternotomy wires noted. IMPRESSION: 1. Persistent left retrocardiac airspace opacity and probable small left pleural effusion. Electronically signed by: Bryan Kil MD 09/02/2024 06:10 AM EST RP Workstation: [id number]   DG Chest Port 1 View Result Date: 09/01/2024 EXAM: 1  VIEW(S) XRAY OF THE CHEST 09/01/2024 04:00:00 PM COMPARISON: Comparison with 08/29/2024. CLINICAL HISTORY: 241121 S/P CABG x 4 241121 FINDINGS: LINES, TUBES AND DEVICES: An endotracheal tube has been placed with the tip measuring 1.9 cm above the carina. An enteric tube was placed. The tip is off the field of view but below the left hemidiaphragm. Mediastinal drains and left chest tubes are present. Right central venous catheter with tip over the mid SVC region. LUNGS AND PLEURA: Atelectasis in the left lung base with probable small left pleural effusion. No pneumothorax. HEART AND  MEDIASTINUM: Interval postoperative changes in the mediastinum with sternotomy wires and vascular markers present. BONES AND SOFT TISSUES: Sternotomy wires are present. IMPRESSION: 1. Appropriate positioning of support devices including endotracheal tube 1.9 cm above the carina, enteric tube coursing below the left hemidiaphragm, right central venous catheter tip over the mid SVC, mediastinal drains, and left chest tubes. No pneumothorax. 2. Atelectasis in the left lung base with probable small left pleural effusion. 3. Interval postoperative changes in the mediastinum with sternotomy wires and vascular markers present. Electronically signed by: Elsie Gravely MD 09/01/2024 05:14 PM EST RP Workstation: [id number]   VAS US  LOWER EXTREMITY SAPHENOUS VEIN MAPPING Result Date: 08/31/2024 LOWER EXTREMITY VEIN MAPPING Patient Name:  Bryan Lage.  Date of Exam:   08/31/2024 Medical Rec #: [id number]          Accession #:    [id number] Date of Birth: [birth year]           Patient Gender: M Patient Age:   65 years Exam Location:  [facility] Procedure:      VAS US  LOWER EXTREMITY SAPHENOUS VEIN MAPPING Referring Phys: CON SU --------------------------------------------------------------------------------  Indications:  CAD Risk Factors: Hypertension, hyperlipidemia, Diabetes.  Comparison Study: No prior studies. Performing Technologist: Cordella Collet RVT  Examination Guidelines: A complete evaluation includes B-mode imaging, spectral Doppler, color Doppler, and power Doppler as needed of all accessible portions of each vessel. Bilateral testing is considered an integral part of a complete examination. Limited examinations for reoccurring indications may be performed as noted. +---------------+-----------+----------------------+---------------+-----------+   RT Diameter  RT Findings         GSV            LT Diameter  LT Findings      (cm)                                            (cm)                   +---------------+-----------+----------------------+---------------+-----------+      0.89                     Saphenofemoral         0.80                                                   Junction                                  +---------------+-----------+----------------------+---------------+-----------+      0.67       branching  Proximal thigh         0.58       branching  +---------------+-----------+----------------------+---------------+-----------+      0.62       branching       Mid thigh            0.49       branching  +---------------+-----------+----------------------+---------------+-----------+      0.62                      Distal thigh          0.63       branching  +---------------+-----------+----------------------+---------------+-----------+      0.53                          Knee              0.59       branching  +---------------+-----------+----------------------+---------------+-----------+      0.40       branching       Prox calf            0.46                  +---------------+-----------+----------------------+---------------+-----------+      0.20       branching        Mid calf            0.38                  +---------------+-----------+----------------------+---------------+-----------+      0.24       branching      Distal calf           0.37                  +---------------+-----------+----------------------+---------------+-----------+ Diagnosing physician: Penne Colorado MD Electronically signed by Penne Colorado MD on 08/31/2024 at 4:02:28 PM.    Final    VAS US  DOPPLER PRE CABG Result Date: 08/31/2024 PREOPERATIVE VASCULAR EVALUATION Patient Name:  Bryan MCGRORY.  Date of Exam:   08/31/2024 Medical Rec #: [id number]          Accession #:    [id number] Date of Birth: 1956-06-08           Patient Gender: M Patient Age:   14 years Exam Location:  [facility], [place] Procedure:      VAS US  DOPPLER PRE CABG  Referring Phys: BAILEY SU --------------------------------------------------------------------------------  Indications:      Pre-CABG. Risk Factors:     Hypertension, hyperlipidemia, Diabetes. Comparison Study: No prior studies. Performing Technologist: Gerome Ny RVT  Examination Guidelines: A complete evaluation includes B-mode imaging, spectral Doppler, color Doppler, and power Doppler as needed of all accessible portions of each vessel. Bilateral testing is considered an integral part of a complete examination. Limited examinations for reoccurring indications may be performed as noted.  Right Carotid Findings: +----------+--------+--------+--------+-----------------------+--------+           PSV cm/sEDV cm/sStenosisDescribe               Comments +----------+--------+--------+--------+-----------------------+--------+ CCA Prox  58      11              smooth and heterogenous         +----------+--------+--------+--------+-----------------------+--------+ CCA Distal58      12              smooth and heterogenous         +----------+--------+--------+--------+-----------------------+--------+  ICA Prox  91      17              smooth and heterogenous         +----------+--------+--------+--------+-----------------------+--------+ ICA Mid   58      18                                              +----------+--------+--------+--------+-----------------------+--------+ ICA Distal40      15                                     tortuous +----------+--------+--------+--------+-----------------------+--------+ ECA       106     1                                               +----------+--------+--------+--------+-----------------------+--------+ +----------+--------+-------+--------+------------+           PSV cm/sEDV cmsDescribeArm Pressure +----------+--------+-------+--------+------------+ Subclavian137                                  +----------+--------+-------+--------+------------+ +---------+--------+--+--------+-+---------+ VertebralPSV cm/s31EDV cm/s7Antegrade +---------+--------+--+--------+-+---------+ Left Carotid Findings: +----------+--------+--------+--------+-----------------------+--------+           PSV cm/sEDV cm/sStenosisDescribe               Comments +----------+--------+--------+--------+-----------------------+--------+ CCA Prox  83      13              smooth and heterogenous         +----------+--------+--------+--------+-----------------------+--------+ CCA Distal90      16              smooth and heterogenous         +----------+--------+--------+--------+-----------------------+--------+ ICA Prox  83      21              smooth and heterogenous         +----------+--------+--------+--------+-----------------------+--------+ ICA Mid   88      26                                     tortuous +----------+--------+--------+--------+-----------------------+--------+ ICA Distal39      13                                     tortuous +----------+--------+--------+--------+-----------------------+--------+ ECA       144     10                                              +----------+--------+--------+--------+-----------------------+--------+ +----------+--------+--------+--------+------------+ SubclavianPSV cm/sEDV cm/sDescribeArm Pressure +----------+--------+--------+--------+------------+           164                                  +----------+--------+--------+--------+------------+ +---------+--------+--+--------+-+---------+ VertebralPSV cm/s31EDV cm/s6Antegrade +---------+--------+--+--------+-+---------+  ABI Findings: +------------------+-----+---------+ Rt Pressure (mmHg)IndexWaveform  +------------------+-----+---------+ 107                    triphasic +------------------+-----+---------+ 154               1.34 triphasic  +------------------+-----+---------+ 159               1.38 triphasic +------------------+-----+---------+ +------------------+-----+---------+ Lt Pressure (mmHg)IndexWaveform  +------------------+-----+---------+ 115                    triphasic +------------------+-----+---------+ 165               1.43 triphasic +------------------+-----+---------+ 144               1.25 triphasic +------------------+-----+---------+ +-------+---------------+ ABI/TBIToday's ABI/TBI +-------+---------------+ Right  1.38            +-------+---------------+ Left   1.43            +-------+---------------+  Right Doppler Findings: +--------+--------+---------+ Site    PressureDoppler   +--------+--------+---------+ [id number]     triphasic +--------+--------+---------+ Radial          triphasic +--------+--------+---------+ Ulnar           triphasic +--------+--------+---------+  Left Doppler Findings: +--------+--------+---------+ Site    PressureDoppler   +--------+--------+---------+ [id number]     triphasic +--------+--------+---------+ Radial          triphasic +--------+--------+---------+ Ulnar           triphasic +--------+--------+---------+   Summary: Right Carotid: Velocities in the right ICA are consistent with a 1-39% stenosis. Left Carotid: Velocities in the left ICA are consistent with a 1-39% stenosis. Vertebrals: Bilateral vertebral arteries demonstrate antegrade flow. Right ABI: Resting right ankle-brachial index indicates noncompressible right lower extremity arteries. Left ABI: Resting left ankle-brachial index indicates noncompressible left lower extremity arteries. Right Upper Extremity: Doppler waveforms decrease >50% with right radial compression. Doppler waveform obliterate with right ulnar compression. Left Upper Extremity: Doppler waveforms remain within normal limits with left radial compression. Doppler waveforms remain within normal limits  with left ulnar compression.  Electronically signed by Penne Colorado MD on 08/31/2024 at 3:58:13 PM.    Final    CARDIAC CATHETERIZATION Result Date: 08/30/2024   Prox RCA lesion is 40% stenosed.   Dist RCA lesion is 100% stenosed.   Ramus lesion is 95% stenosed with 90% stenosed side branch in Lat Ramus.   1st Mrg lesion is 95% stenosed.   RPAV lesion is 100% stenosed.   RPDA lesion is 100% stenosed.   Prox LAD lesion is 90% stenosed.   Mid LAD lesion is 80% stenosed.   LV end diastolic pressure is mildly elevated.   There is no aortic valve stenosis.   Anticipated discharge date to be determined.   Continue aspirin  81 mg daily.  Defer P2Y12 inhibitor pending cardiac surgery consultation.  Resume heparin  infusion 2 hours after TR band removal. Conclusions: Severe three-vessel coronary artery disease, as detailed below, including sequential 90% and 80% proximal/mid LAD stenoses, 90-95% stenoses of bifurcating ramus intermedius, 95% stenosis of OM1, and chronic total occlusion of distal RCA. Mildly elevated left ventricular filling pressure (LVEDP 20 mmHg). Recommendations: Inpatient cardiac surgery consultation for CABG. Resume heparin  infusion 2 hours after TR band has been removed. Aggressive secondary prevention of coronary artery disease. Transition from nitroglycerin  paste to isosorbide  mononitrate for antianginal therapy. Lonni Hanson, MD [facility]  ECHOCARDIOGRAM COMPLETE Result Date: 08/30/2024    ECHOCARDIOGRAM REPORT   Patient Name:  Bryan FORBES [name] Date of Exam: 08/30/2024 Medical Rec #:  [id number]         Height:       67.0 in Accession #:    [id number]        Weight:       200.0 lb Date of Birth:  [birth year]          BSA:          2.022 m Patient Age:    68 years          BP:           175/85 mmHg Patient Gender: M                 HR:           71 bpm. Exam Location:  [facility] Procedure: 2D Echo, Color Doppler and Cardiac Doppler (Both Spectral and Color            Flow Doppler were  utilized during procedure). Indications:    NSTEMI I21.4  History:        Patient has no prior history of Echocardiogram examinations.                 Risk Factors:Hypertension and Diabetes.  Sonographer:    Bryan Shepard RDCS Referring Phys: [id number] CAROLE N HALL IMPRESSIONS  1. Left ventricular ejection fraction, by estimation, is 60 to 65%. The left ventricle has normal function. Left ventricular endocardial border not optimally defined to evaluate regional wall motion. There is mild left ventricular hypertrophy. Left ventricular diastolic parameters are consistent with Grade I diastolic dysfunction (impaired relaxation).  2. Right ventricular systolic function is normal. The right ventricular size is normal. Tricuspid regurgitation signal is inadequate for assessing PA pressure.  3. The mitral valve is normal in structure. Trivial mitral valve regurgitation. No evidence of mitral stenosis.  4. The aortic valve was not well visualized. Aortic valve regurgitation is not visualized. No aortic stenosis is present.  5. The inferior vena cava is normal in size with greater than 50% respiratory variability, suggesting right atrial pressure of 3 mmHg. Comparison(s): No prior Echocardiogram. FINDINGS  Left Ventricle: Left ventricular ejection fraction, by estimation, is 60 to 65%. The left ventricle has normal function. Left ventricular endocardial border not optimally defined to evaluate regional wall motion. Strain was performed and the global longitudinal strain is indeterminate. The left ventricular internal cavity size was normal in size. There is mild left ventricular hypertrophy. Left ventricular diastolic parameters are consistent with Grade I diastolic dysfunction (impaired relaxation).  Normal left ventricular filling pressure. Right Ventricle: The right ventricular size is normal. No increase in right ventricular wall thickness. Right ventricular systolic function is normal. Tricuspid regurgitation signal is  inadequate for assessing PA pressure. Left Atrium: Left atrial size was normal in size. Right Atrium: Right atrial size was normal in size. Pericardium: There is no evidence of pericardial effusion. Mitral Valve: The mitral valve is normal in structure. Trivial mitral valve regurgitation. No evidence of mitral valve stenosis. Tricuspid Valve: The tricuspid valve is normal in structure. Tricuspid valve regurgitation is not demonstrated. No evidence of tricuspid stenosis. Aortic Valve: The aortic valve was not well visualized. Aortic valve regurgitation is not visualized. No aortic stenosis is present. Pulmonic Valve: The pulmonic valve was not well visualized. Pulmonic valve regurgitation is not visualized. No evidence of pulmonic stenosis. Aorta: The aortic root and ascending aorta are structurally normal, with no evidence of dilitation. Venous: The inferior  vena cava is normal in size with greater than 50% respiratory variability, suggesting right atrial pressure of 3 mmHg. IAS/Shunts: No atrial level shunt detected by color flow Doppler. Additional Comments: 3D was performed not requiring image post processing on an independent workstation and was indeterminate.  LEFT VENTRICLE PLAX 2D LVIDd:         4.20 cm   Diastology LVIDs:         3.00 cm   LV e' medial:    5.98 cm/s LV PW:         1.20 cm   LV E/e' medial:  14.3 LV IVS:        1.20 cm   LV e' lateral:   10.30 cm/s LVOT diam:     2.20 cm   LV E/e' lateral: 8.3 LV SV:         69 LV SV Index:   34 LVOT Area:     3.80 cm LV IVRT:       90 msec  RIGHT VENTRICLE             IVC RV S prime:     12.00 cm/s  IVC diam: 1.50 cm TAPSE (M-mode): 2.3 cm LEFT ATRIUM             Index        RIGHT ATRIUM           Index LA diam:        4.00 cm 1.98 cm/m   RA Area:     11.90 cm LA Vol (A2C):   27.4 ml 13.55 ml/m  RA Volume:   23.80 ml  11.77 ml/m LA Vol (A4C):   34.2 ml 16.91 ml/m LA Biplane Vol: 33.1 ml 16.37 ml/m  AORTIC VALVE LVOT Vmax:   91.10 cm/s LVOT Vmean:   65.900 cm/s LVOT VTI:    0.181 m  AORTA Ao Root diam: 2.60 cm Ao Asc diam:  3.40 cm MITRAL VALVE MV Area (PHT): 2.53 cm     SHUNTS MV Decel Time: 300 msec     Systemic VTI:  0.18 m MV E velocity: 85.50 cm/s   Systemic Diam: 2.20 cm MV A velocity: 113.00 cm/s MV E/A ratio:  0.76 Vishnu Priya Mallipeddi Electronically signed by Diannah Late Mallipeddi Signature Date/Time: 08/30/2024/12:27:12 PM    Final    CT Angio Chest PE W and/or Wo Contrast Result Date: 08/29/2024 EXAM: CTA of the Chest with contrast for PE 08/29/2024 07:36:01 PM TECHNIQUE: CTA of the chest was performed after the administration of intravenous contrast. 75 ml omnipaque  350 Multiplanar reformatted images are provided for review. MIP images are provided for review. Automated exposure control, iterative reconstruction, and/or weight based adjustment of the mA/kV was utilized to reduce the radiation dose to as low as reasonably achievable. COMPARISON: Chest x-ray from 08/29/2024. CLINICAL HISTORY: Chest pain. FINDINGS: PULMONARY ARTERIES: No intraluminal filling defect to suggest pulmonary embolism is noted. Main pulmonary artery is normal in caliber. The pulmonary artery shows a normal branching pattern bilaterally. MEDIASTINUM: The heart is not significantly enlarged. Scattered coronary calcifications are noted. The thoracic aorta shows atherosclerotic calcifications. Mild dilatation of the ascending aorta to 4.2 cm is noted. The thoracic inlet is within normal limits. The esophagus, as visualized is within normal limits. LYMPH NODES: No mediastinal, hilar or axillary lymphadenopathy. LUNGS AND PLEURA: The lungs are well aerated bilaterally. No focal infiltrate or effusion is seen. No pneumothorax. UPPER ABDOMEN: The upper abdomen is unremarkable. Prior cholecystectomy is seen.  SOFT TISSUES AND BONES: Degenerative changes of the thoracic spine are seen. No rib abnormality is noted. No acute soft tissue abnormality. IMPRESSION: 1. No evidence of  pulmonary embolism. 2. 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 This recommendation follows 2010 ACCF/AHA/AATS/ACR/ASA/SCA/SCAI/SIR/STS/SVM Guidelines for the Diagnosis and Management of Patients with Thoracic Aortic Disease. Circulation. 2010; 121: Z733-z630. Aortic aneurysm NOS (ICD10-I71.9) Electronically signed by: Oneil Devonshire MD 08/29/2024 07:45 PM EST RP Workstation: [id number]   DG Chest Port 1 View Result Date: 08/29/2024 EXAM: 1 VIEW(S) XRAY OF THE CHEST 08/29/2024 06:02:00 PM COMPARISON: 11/13/2014 CLINICAL HISTORY: CP FINDINGS: LUNGS AND PLEURA: No focal pulmonary opacity. No pleural effusion. No pneumothorax. HEART AND MEDIASTINUM: No acute abnormality of the cardiac and mediastinal silhouettes. BONES AND SOFT TISSUES: No acute osseous abnormality. IMPRESSION: 1. No acute cardiopulmonary process. Electronically signed by: Bryan Crease MD 08/29/2024 06:07 PM EST RP Workstation: [id number]    Disposition Pt is being discharged home today in good condition.  Follow-up Plans & Appointments  Follow-up Information     Gladis Mustard, FNP Follow up.   Specialty: Family Medicine Why: TIME : 3:00 PM DATE : JANUARY 02 , 2026 FRIDAY  PLEASE BRING ALL CURRENT MEDICATION, ID and INS CARD, CO-PAY Contact information: [address] [place] [address] [phone]                Discharge Instructions     Amb Referral to Cardiac Rehabilitation   Complete by: As directed    Diagnosis:  STEMI Coronary Stents     After initial evaluation and assessments completed: Virtual Based Care may be provided alone or in conjunction with Phase 2 Cardiac Rehab based on patient barriers.: Yes   Intensive Cardiac Rehabilitation (ICR) [facility] location only OR Traditional Cardiac Rehabilitation (TCR) *If criteria for ICR are not met will enroll in TCR [facility] only): Yes   Call MD for:   difficulty breathing, headache or visual disturbances   Complete by: As directed    Call MD for:  redness, tenderness, or signs of infection (pain, swelling, redness, odor or green/yellow discharge around incision site)   Complete by: As directed    Discharge instructions   Complete by: As directed    PLEASE DO NOT MISS ANY DOSES OF YOUR BRILINTA !!!!! Also keep a log of you blood pressures and bring back to your follow up appt. Please call the office with any questions.   Patients taking blood thinners should generally stay away from medicines like ibuprofen , Advil , Motrin , naproxen, and Aleve due to risk of stomach bleeding. You may take Tylenol  as directed or talk to your primary doctor about alternatives.   PLEASE ENSURE THAT YOU DO NOT RUN OUT OF YOUR BRILINTA . This medication is very important to remain on for at least one year. IF you have issues obtaining this medication due to cost please CALL the office 3-5 business days prior to running out in order to prevent missing doses of this medication.   Increase activity slowly   Complete by: As directed    No wound care   Complete by: As directed        Discharge Medications Allergies as of 09/28/2024   No Known Allergies      Medication List     STOP taking these medications    clopidogrel  75 MG tablet Commonly known as: Plavix        TAKE these medications    ACETAMINOPHEN   ER PO Take 650 mg by mouth every 8 (eight) hours as needed (pain).   albuterol  108 (90 Base) MCG/ACT inhaler Commonly known as: VENTOLIN  HFA Inhale 2 puffs into the lungs every 6 (six) hours as needed for shortness of breath.   aspirin  EC 81 MG tablet Take 1 tablet (81 mg total) by mouth daily. Swallow whole. Start taking on: September 29, 2024   CYANOCOBALAMIN PO Take 3,000 mcg by mouth daily.   diclofenac Sodium 1 % Gel Commonly known as: VOLTAREN Apply 4 g topically 4 (four) times daily as needed (for pain). On the back    empagliflozin  10 MG Tabs tablet Commonly known as: JARDIANCE  Take 1 tablet (10 mg total) by mouth daily.   metFORMIN  500 MG tablet Commonly known as: GLUCOPHAGE  Take 1 tablet (500 mg total) by mouth 2 (two) times daily with a meal.   metoprolol  tartrate 25 MG tablet Commonly known as: LOPRESSOR  Take 1 tablet (25 mg total) by mouth 2 (two) times daily. What changed: how much to take   multivitamin tablet Take 1 tablet by mouth daily. Vitafusion Men's   nitroGLYCERIN  0.4 MG SL tablet Commonly known as: NITROSTAT  Place 1 tablet (0.4 mg total) under the tongue every 5 (five) minutes x 3 doses as needed for chest pain.   rosuvastatin  20 MG tablet Commonly known as: CRESTOR  Take 1 tablet (20 mg total) by mouth daily at 6 PM.   ticagrelor  90 MG Tabs tablet Commonly known as: BRILINTA  Take 1 tablet (90 mg total) by mouth 2 (two) times daily.   torsemide  20 MG tablet Commonly known as: DEMADEX  Take 1 tablet (20 mg total) by mouth daily.   traMADol  50 MG tablet Commonly known as: ULTRAM  Take 50 mg by mouth every 4 (four) hours as needed for moderate pain (pain score 4-6).         Outstanding Labs/Studies  BMET at follow up  Duration of Discharge Encounter: APP Time: 20 minutes   Signed, Manuelita Rummer, NP 09/28/2024, 2:28 PM    Agree with note by Manuelita Rummer NP-C  Patient admitted with STEMI.  He had just had CABG x 4 several weeks before with a LIMA to the LAD, vein to ramus branch, obtuse marginal branch and distal RCA.  He underwent cardiac catheterization by Dr. Wonda revealing occluded vein grafts with patent LIMA.  Dr. Wonda attempted to recanalize the OM branch native and what was able to open up the OM vein graft.  Plan was to bring the patient back the following day for ramus intervention which was performed successfully with 2 overlapping stents.  The OM vein graft was patent with a high-grade distal anastomotic lesion.  LV function was normal.  Other  problems as outlined including essential hypertension, hyperlipidemia and diabetes.  He did have episodes of SVT and NSVT on telemetry and his beta-blocker from home was restarted and uptitrated.  He was deemed stable for discharge.  Will arrange TOC 7 after which he will see Dr. Stacia back in follow-up.  Dorn DOROTHA Lesches, M.D., FACP, [facility], LYNITA [facility] [address] [place], [place]  [address]  [phone] 09/28/2024 2:40 PM  "

## 2024-09-28 NOTE — Progress Notes (Signed)
 CARDIAC REHAB PHASE I    Post MI/stent education provided to wife and patient. Education included restrictions, risk factors, exercise guidelines, antiplatelet therapy importance, MI booklet, NTG use, heart healthy diet, and CRP2 reviewed. All questions and concerns addressed. Will refer to AP for CRP2.   [id number] Bryan Shepard [place], RN BSN 09/28/2024 11:56 AM

## 2024-09-28 NOTE — Progress Notes (Signed)
 CARDIAC REHAB PHASE I   PRE:  Rate/Rhythm: Sinus Rhythm 82  BP:   Sitting: 101/78     SaO2: 98% RA  MODE:  Ambulation: 300 ft   POST:  Rate/Rhythm: 81  BP:    Sitting: 118/78     SaO2: 96% RA 1410-1430 Patient ambulated independently in the hallway using rollator. Tolerated well. Patient assisted back to bed with call bell within reach. Wife present.  Hadassah Elpidio Quan RN

## 2024-09-28 NOTE — Care Management Important Message (Signed)
 Important Message  Patient Details  Name: Bryan Shepard. MRN: [id number] Date of Birth: 1955/12/24   Important Message Given:  Yes - Medicare IM     [name] 09/28/2024, 12:25 PM

## 2024-09-29 ENCOUNTER — Ambulatory Visit: Admitting: Gastroenterology

## 2024-09-29 ENCOUNTER — Telehealth: Payer: Self-pay | Admitting: *Deleted

## 2024-09-29 NOTE — Transitions of Care (Post Inpatient/ED Visit) (Signed)
 "  09/29/2024  Name: Bryan Shepard. MRN: [id number] DOB: March 08, 1956  Today's TOC FU Call Status: Today's TOC FU Call Status:: Successful TOC FU Call Completed TOC FU Call Complete Date: 09/29/24  Patient's Name and Date of Birth confirmed. Name, DOB  Transition Care Management Follow-up Telephone Call Date of Discharge: 09/28/24 Discharge Facility: [facility]) Type of Discharge: Inpatient Admission Primary Inpatient Discharge Diagnosis:: STEMI involving left circumflex coronary artery How have you been since you were released from the hospital?:  (eating, drinking well, no issues wtih bowel/ bladder, ambulating without difficulty, independent with ADL's) Any questions or concerns?: No  Items Reviewed: Did you receive and understand the discharge instructions provided?: Yes Medications obtained,verified, and reconciled?: Yes (Medications Reviewed) Any new allergies since your discharge?: No Dietary orders reviewed?: Yes Type of Diet Ordered:: cardiac Do you have support at home?: Yes People in Home [RPT]: spouse Name of Support/Comfort Primary Source: [place]  Gretta Reviewed signs/ symptoms MI- seeking emergency assistance immediately Reviewed how, when to use nitroglycerin   Medications Reviewed Today: Medications Reviewed Today     Reviewed by Aura Mliss LABOR, RN (Registered Nurse) on 09/29/24 at [phone]  Med List Status: <None>   Medication Order Taking? Sig Documenting Provider Last Dose Status Informant  ACETAMINOPHEN  ER PO [id number] Yes Take 650 mg by mouth every 8 (eight) hours as needed (pain). [provider]  Active   albuterol  (VENTOLIN  HFA) 108 (90 Base) MCG/ACT inhaler [id number] Yes Inhale 2 puffs into the lungs every 6 (six) hours as needed for shortness of breath. [provider]  Active Self  aspirin  EC 81 MG tablet [id number] Yes Take 1 tablet (81 mg total) by mouth daily. Swallow whole. Henry Manuelita NOVAK, NP  Active   CYANOCOBALAMIN PO  [id number] Yes Take 3,000 mcg by mouth daily. [provider]  Active   diclofenac Sodium (VOLTAREN) 1 % GEL [id number] Yes Apply 4 g topically 4 (four) times daily as needed (for pain). On the back [provider]  Active   empagliflozin  (JARDIANCE ) 10 MG TABS tablet [id number] Yes Take 1 tablet (10 mg total) by mouth daily. Vicci Afton CROME, MD  Active   metFORMIN  (GLUCOPHAGE ) 500 MG tablet [id number] Yes Take 1 tablet (500 mg total) by mouth 2 (two) times daily with a meal. Barrett, Erin R, PA-C  Active   metoprolol  tartrate (LOPRESSOR ) 25 MG tablet [id number] Yes Take 1 tablet (25 mg total) by mouth 2 (two) times daily. Henry Manuelita NOVAK, NP  Active   Multiple Vitamin (MULTIVITAMIN) tablet [id number] Yes Take 1 tablet by mouth daily. Vitafusion Men's [provider]  Active   nitroGLYCERIN  (NITROSTAT ) 0.4 MG SL tablet [id number] Yes Place 1 tablet (0.4 mg total) under the tongue every 5 (five) minutes x 3 doses as needed for chest pain. Henry Manuelita NOVAK, NP  Active   rosuvastatin  (CRESTOR ) 20 MG tablet [id number] Yes Take 1 tablet (20 mg total) by mouth daily at 6 PM. Barrett, Erin R, PA-C  Active   ticagrelor  (BRILINTA ) 90 MG TABS tablet [id number] Yes Take 1 tablet (90 mg total) by mouth 2 (two) times daily. Henry Manuelita NOVAK, NP  Active   torsemide  (DEMADEX ) 20 MG tablet [id number] Yes Take 1 tablet (20 mg total) by mouth daily. Vicci Afton CROME, MD  Active   traMADol  (ULTRAM ) 50 MG tablet [id number] Yes Take 50 mg by mouth every 4 (four) hours as needed for moderate pain (pain score 4-6). [provider]  Active  Home Care and Equipment/Supplies: Were Home Health Services Ordered?: Yes Name of Home Health Agency:: spouse states she has already spoken to OT with [facility] and they will be scheduling resumption of care Has Agency set up a time to come to your home?: No EMR reviewed for Home Health Orders: Orders present/patient has not  received call (refer to CM for follow-up) Any new equipment or medical supplies ordered?: No  Functional Questionnaire: Do you need assistance with bathing/showering or dressing?: No Do you need assistance with meal preparation?: No Do you need assistance with eating?: No Do you have difficulty maintaining continence: No Do you need assistance with getting out of bed/getting out of a chair/moving?: No Do you have difficulty managing or taking your medications?: Yes (spouse provides oversight)  Follow up appointments reviewed: PCP Follow-up appointment confirmed?: Yes Date of PCP follow-up appointment?: 10/08/24 Follow-up Provider: [facility] Follow-up appointment confirmed?: Yes Date of Specialist follow-up appointment?: 10/01/24 Follow-Up Specialty Provider:: cardiology   [facility] Do you need transportation to your follow-up appointment?: No Do you understand care options if your condition(s) worsen?: Yes-patient verbalized understanding  SDOH Interventions Today    Flowsheet Row Most Recent Value  SDOH Interventions   Food Insecurity Interventions Intervention Not Indicated  Housing Interventions Intervention Not Indicated  Transportation Interventions Intervention Not Indicated  Utilities Interventions Intervention Not Indicated    Mliss Creed [facility], BSN RN Care Manager/ Transition of Care [place] Population Health [phone]  "

## 2024-10-01 ENCOUNTER — Encounter: Payer: Self-pay | Admitting: Emergency Medicine

## 2024-10-01 ENCOUNTER — Ambulatory Visit: Attending: Emergency Medicine | Admitting: Emergency Medicine

## 2024-10-01 VITALS — BP 92/60 | HR 74 | Ht 67.0 in | Wt 179.0 lb

## 2024-10-01 DIAGNOSIS — Z48812 Encounter for surgical aftercare following surgery on the circulatory system: Secondary | ICD-10-CM | POA: Diagnosis not present

## 2024-10-01 DIAGNOSIS — I2121 ST elevation (STEMI) myocardial infarction involving left circumflex coronary artery: Secondary | ICD-10-CM | POA: Diagnosis present

## 2024-10-01 DIAGNOSIS — Z951 Presence of aortocoronary bypass graft: Secondary | ICD-10-CM | POA: Diagnosis present

## 2024-10-01 DIAGNOSIS — E785 Hyperlipidemia, unspecified: Secondary | ICD-10-CM | POA: Insufficient documentation

## 2024-10-01 DIAGNOSIS — I4729 Other ventricular tachycardia: Secondary | ICD-10-CM | POA: Diagnosis present

## 2024-10-01 DIAGNOSIS — I10 Essential (primary) hypertension: Secondary | ICD-10-CM | POA: Insufficient documentation

## 2024-10-01 DIAGNOSIS — E119 Type 2 diabetes mellitus without complications: Secondary | ICD-10-CM | POA: Diagnosis present

## 2024-10-01 DIAGNOSIS — I2510 Atherosclerotic heart disease of native coronary artery without angina pectoris: Secondary | ICD-10-CM | POA: Insufficient documentation

## 2024-10-01 DIAGNOSIS — I5032 Chronic diastolic (congestive) heart failure: Secondary | ICD-10-CM | POA: Insufficient documentation

## 2024-10-01 LAB — BASIC METABOLIC PANEL WITH GFR
BUN/Creatinine Ratio: 17 (ref 10–24)
BUN: 26 mg/dL (ref 8–27)
CO2: 17 mmol/L — ABNORMAL LOW (ref 20–29)
Calcium: 9.8 mg/dL (ref 8.6–10.2)
Chloride: 106 mmol/L (ref 96–106)
Creatinine, Ser: 1.54 mg/dL — ABNORMAL HIGH (ref 0.76–1.27)
Glucose: 107 mg/dL — ABNORMAL HIGH (ref 70–99)
Potassium: 4.3 mmol/L (ref 3.5–5.2)
Sodium: 140 mmol/L (ref 134–144)
eGFR: 49 mL/min/1.73 — ABNORMAL LOW

## 2024-10-01 LAB — CBC
Hematocrit: 40.8 % (ref 37.5–51.0)
Hemoglobin: 12.8 g/dL — ABNORMAL LOW (ref 13.0–17.7)
MCH: 28.8 pg (ref 26.6–33.0)
MCHC: 31.4 g/dL — ABNORMAL LOW (ref 31.5–35.7)
MCV: 92 fL (ref 79–97)
Platelets: 263 x10E3/uL (ref 150–450)
RBC: 4.45 x10E6/uL (ref 4.14–5.80)
RDW: 13.9 % (ref 11.6–15.4)
WBC: 7.1 x10E3/uL (ref 3.4–10.8)

## 2024-10-01 NOTE — Progress Notes (Signed)
 " Cardiology Office Note:    Date:  10/01/2024  ID:  Bryan Shepard., DOB 1956/08/28, MRN [id number] PCP: Severa Rock HERO, FNP  [facility] Providers Cardiologist:  Bryan SHAUNNA Maywood, MD       Patient Profile:       Chief Complaint: Hospital follow-up History of Present Illness:  Bryan Lindon. is a 67 y.o. 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  Patient had ILR placed.   Patient seen by heart care and CT surgery during hospitalization from 11/23-30/2025, patient with NSTEMI.  Cardiac catheterization on 11/24 showeved multiveseel disease, he underwent CABGx4 with LIMA-LAD, SVG-OM, SVG-RAMUS, SVG-PDA on 09/01/24.  He was discharged home on 09/05/2024 in stable condition.   On 09/11/24 he presented to [facility] ED for acute onset of worsening DOE. proBNP 2578, TN 128 > 129. CXR without any acute findings. Chest CTA with no PE but showed small left pleural effusion and left lower lobe atelectasis.  Echocardiogram 09/12/2024 showed EF 55 to 60%, normal LV/RV function, moderate LVH, G1 DD, mildly dilated LA, trivial MV regurgitation, AV sclerosis without AS. Patient reported worsening shortness of breath since the Thursday prior to admission associated with standing, walking to restroom and walking up the stairs in his house. He did not notice increase in urine output with home lasix . Patient was started on Jardiance  10 mg daily and Torsemide  20 mg daily.   Patient was seen by CT surgery team on 09/15/24, recommended to continue using incentive spirometer. 2 view chest xray without acute cardiopulmonary process.   Last seen in clinic by Katlyn, NP on 09/22/2024.  Patient  reported palpitations after exertion and lying down as though his heart is beating extremely hard.  Patient reported significant fatigue, dizziness, and lightheadedness.  Weights have been downtrending and creatinine have been uptrending.  His metoprolol  was decreased to 12.5 mg twice daily and his torsemide  was transition to as needed.  His initial blood pressure was 94/66.  He was seen in the hospital on 09/25/2024 for the evaluation of STEMI.  He presented with abrupt onset of chest discomfort and back pain.  A code STEMI was called in the field after his EKG confirmed acute inferolateral ST elevation.  He was transferred directly to the cardiac Cath Lab.  Underwent initial cardiac catheterization 1220 with patent LIMA to LAD, severely stenotic ramus intermediate with total occlusion of vein graft, total occlusion of OM1, and vein graft with unsuccessful PCI attempt stent native vessel and SVG occlusion sites.  Was recommended for staged PCI of native ramus intermediate.  He was brought back to the Cath Lab on 12/22 and underwent successful PCI/DES x 1 to native ramus intermediate.  There was consideration of staged PCI of distal SVG to OM in 68 weeks depending on symptoms/presence of angina.  Recommendations of DAPT with aspirin /Brilinta  for at least 1 year.  He underwent echocardiogram on the did show LVEF 55 to 60%.  He was found to have brief episodes of NSVT on telemetry and was resumed on metoprolol  25 mg daily at discharge.   Discussed the use of AI scribe software for clinical note transcription with the patient, who gave verbal consent to proceed.  History of Present  Illness Bryan Russ. is a 68 year old male with coronary artery disease who presents for follow-up after recent hospitalization.  He presents today with his wife who is a retired publishing rights manager.  Today patient tells me he is doing well overall.  He has remained adherent to prescribed regimen.  The chest pressure that  prompted his most recent admission has improved and he no longer has chest discomfort.  He does have shortness of breath that has been ongoing but has improved since his recent stent placement.  He tells me he will become short of breath if he is standing in 1 place for too long but this does improve when he walks.  Tells me his exertional dyspnea has been improving slowly.  He can now walk to the bathroom and carry dishes to the kitchen without feeling severely dyspneic.  His prior lightheadedness and dizziness have improved after medication adjustments and now occur only if he stands in one place for too long.  After his recent hospitalization, his metoprolol  was increased back to full-dose Lopressor  twice daily.  He reports no palpitations or episodes of tachycardia.   He is without orthopnea, PND, LEE, weight gain, syncope, presyncope, lightheadedness, dizziness   Review of systems:  Please see the history of present illness. All other systems are reviewed and otherwise negative.       Studies Reviewed:    EKG Interpretation Date/Time:  Friday October 01 2024 09:57:08 EST Ventricular Rate:  74 PR Interval:  142 QRS Duration:  104 QT Interval:  386 QTC Calculation: 428 R Axis:   78  Text Interpretation: Sinus rhythm with Premature supraventricular complexes T wave abnormality, consider inferolateral ischemia When compared with ECG of 28-Sep-2024 03:24, Premature supraventricular complexes are now Present T wave inversion now evident in Inferior leads T wave inversion more evident in Lateral leads QT has shortened No significant change was found Confirmed by Rana Dixon [phone]) on 10/01/2024 12:57:18 PM    Cardiac catheterization 09/27/2024 Successful PCI and drug-eluting stent implantation in the native ramus intermedius branch with a 2.0 x 18 mm Onyx DES, reducing 95% stenosis to 0% post PCI with TIMI-3 flow 2.  Patency of the recently occluded saphenous vein graft to OM  with antegrade flow into the obtuse marginal branch, also supplying collaterals to the distal RCA territory. There is a moderate lesion in the proximal body of the graft and a severe distal anastomotic lesion, but TIMI-3 flow in this graft that was previously occluded.    Plan: med Rx, DC home tomorrow, consider staged PCI of the distal SVG-OM in 6-8 weeks, depending on the patient's symptoms/presence of angina. Diagnostic Dominance: Right  Intervention   Echocardiogram limited 09/26/2024  1. Left ventricular ejection fraction, by estimation, is 55 to 60%. The  left ventricle has normal function. The left ventricle demonstrates  regional wall motion abnormalities (see scoring diagram/findings for  description).   2. Right ventricular systolic function was not well visualized.   3. The mitral valve is grossly normal. Trivial mitral valve  regurgitation.   4. The aortic valve is tricuspid. Aortic valve regurgitation is not  visualized. Aortic valve sclerosis/calcification is present, without any  evidence of aortic stenosis.   5. The inferior vena cava is normal in size with <50% respiratory  variability, suggesting right atrial pressure of 8 mmHg.   Cardiac catheterization 09/25/2024 1.  Patent left main with no significant stenosis 2.  Patent LAD with severe proximal stenosis and continued patency of  the LIMA to LAD graft 3.  Severely stenotic ramus intermedius with total occlusion of the saphenous vein graft 4.  Total occlusion of the OM1, and total occlusion of the saphenous vein graft, unsuccessful PCI attempts at both the native vessel and the SVG occlusion sites 5.  Total occlusion of the native RCA and total occlusion of the SVG to RCA, PDA collateralized by the left coronary artery 6.  Normal LVEDP   Recommendations: Medical therapy and staged PCI of the native ramus intermedius, likely on Monday as long as the patient is clinically stable.  Findings and treatment plan discussed  with the patient and his wife who is present today. Diagnostic Dominance: Right  Intervention   Echocardiogram 09/12/2024  1. Left ventricular ejection fraction, by estimation, is 55 to 60%. The  left ventricle has normal function. The left ventricle has no regional  wall motion abnormalities. There is moderate left ventricular hypertrophy.  Left ventricular diastolic  parameters are consistent with Grade I diastolic dysfunction (impaired  relaxation).   2. Right ventricular systolic function is normal. The right ventricular  size is normal.   3. Left atrial size was mildly dilated.   4. The mitral valve is abnormal. Trivial mitral valve regurgitation. No  evidence of mitral stenosis.   5. The aortic valve is tricuspid. There is moderate calcification of the  aortic valve. There is moderate thickening of the aortic valve. Aortic  valve regurgitation is not visualized. Aortic valve sclerosis is present,  with no evidence of aortic valve  stenosis.   6. The inferior vena cava is normal in size with greater than 50%  respiratory variability, suggesting right atrial pressure of 3 mmHg.     Risk Assessment/Calculations:              Physical Exam:   VS:  BP 92/60 (BP Location: Left Arm, Patient Position: Sitting, Cuff Size: Normal)   Pulse 74   Ht 5' 7 (1.702 m)   Wt 179 lb (81.2 kg)   BMI 28.04 kg/m    Wt Readings from Last 3 Encounters:  10/01/24 179 lb (81.2 kg)  09/28/24 173 lb 9.6 oz (78.7 kg)  09/22/24 178 lb 3.2 oz (80.8 kg)    GEN: Well nourished, well developed in no acute distress NECK: No JVD; No carotid bruits CARDIAC: RRR, no murmurs, rubs, gallops RESPIRATORY:  Clear to auscultation without rales, wheezing or rhonchi  ABDOMEN: Soft, non-tender, non-distended EXTREMITIES:  No edema; No acute deformity      Assessment and Plan:  Coronary artery disease s/p CABG STEMI - s/p CABG x4 with SVG-PDA, SVG-OM, SVG-ramus, LIMA-LAD on 08/2024 - S/p STEMI (3  weeks status post CABG) on 12/20 with LHC revealing occluded vein grafts with a patent LIMA and severe native vessel disease with unsuccessful PCI attempts at the native vessel and SVG occlusion sites.  Was recommended for staged PCI of the native ramus intermedius - Brought back to Cath Lab on 12/22 and underwent successful PCI/DES x 1 to native ramus intermediate.  There was consideration of staged PCI of distal SVG to OM in 6 to 8 weeks depending on symptoms/presence of angina - Today patient is stable without chest pain.  Patient denies prior anginal equivalent (chest pain/back pain with left arm radiation).  He does report that his dyspnea has improved over the past several days but does persist.  Now more active at home without exertional chest discomfort.  No indication at this time for further evaluation at this  time given no presence of recurrent angina - Continue aspirin  81 mg daily and Brilinta  90 mg twice daily - Continue rosuvastatin  20 mg daily and metoprolol  tartrate 25 mg twice daily - Okay to begin cardiac rehab - ED precautions given  NSVT Noted to have brief episodes of NSVT on telemetry during recent admission - Today patient denies any episodes of palpitations, tachycardia, or syncope/presyncope - Continue metoprolol  tartrate 25 mg twice daily - Currently pending ZIO monitor for further evaluation of arrhythmia burden  HFpEF Echo limited 09/2024 with LVEF 55 to 60% - Today patient appears euvolemic and well compensated on exam - Continue Jardiance  10 mg daily, metoprolol  tartrate 25 mg twice daily, and torsemide  20 mg as needed  Hyperlipidemia LDL 18 on 09/2024 and well-controlled - Continue rosuvastatin  20 mg daily  Hypertension Blood pressure today is 92/60 Home blood pressure readings in the 110s.  He is without lightheadedness, dizziness, syncope or presyncope - Continue metoprolol  tartrate 25 mg twice daily  T2DM 7% on 09/2024 - Managed on Jardiance  and  metformin     Cardiac Rehabilitation Eligibility Assessment  The patient is ready to start cardiac rehabilitation pending clearance from the cardiac surgeon.     Dispo:  Return in about 6 weeks (around 11/12/2024).  Signed, Bryan LITTIE Louis, NP  "

## 2024-10-01 NOTE — Patient Instructions (Addendum)
 Medication Instructions:  NO CHANGES  Lab Work: BMET AND CBC TO BE DONE TODAY.  Testing/Procedures: NONE  Follow-Up: At [facility], you and your health needs are our priority.  As part of our continuing mission to provide you with exceptional heart care, our providers are all part of one team.  This team includes your primary Cardiologist (physician) and Advanced Practice Providers or APPs (Physician Assistants and Nurse Practitioners) who all work together to provide you with the care you need, when you need it.  Your next appointment:   6 WEEKS WITH DR. MALLIPEDDI OR ANY APP AT [place] OFFICE  Provider:   Vishnu P Mallipeddi, MD

## 2024-10-03 ENCOUNTER — Other Ambulatory Visit: Payer: Self-pay | Admitting: Family Medicine

## 2024-10-03 DIAGNOSIS — E1169 Type 2 diabetes mellitus with other specified complication: Secondary | ICD-10-CM

## 2024-10-05 ENCOUNTER — Ambulatory Visit: Admitting: Physician Assistant

## 2024-10-05 ENCOUNTER — Ambulatory Visit: Payer: Self-pay | Admitting: Emergency Medicine

## 2024-10-05 DIAGNOSIS — Z79899 Other long term (current) drug therapy: Secondary | ICD-10-CM

## 2024-10-08 ENCOUNTER — Ambulatory Visit (INDEPENDENT_AMBULATORY_CARE_PROVIDER_SITE_OTHER): Admitting: Nurse Practitioner

## 2024-10-08 ENCOUNTER — Encounter: Payer: Self-pay | Admitting: Nurse Practitioner

## 2024-10-08 VITALS — BP 109/71 | HR 74 | Temp 96.6°F | Ht 67.0 in | Wt 172.0 lb

## 2024-10-08 DIAGNOSIS — Z955 Presence of coronary angioplasty implant and graft: Secondary | ICD-10-CM

## 2024-10-08 NOTE — Progress Notes (Signed)
 "  Subjective:    Patient ID: Bryan Shepard., male    DOB: [birth year], 69 y.o.   MRN: [id number]   Chief Complaint: hospital follow up  HPI  Patient had stemi MI on 09/11/24 and had stent placement on 09/25/24. Spent 3 days in hospital after stent placement. Upon discharge left circumflex artery was open and perfusing. They stopped his plavix  upon discharge and started him on brinlinta. He had follow up appointment with cardiology on 10/01/24. Lopressor  was increased back to his normal dose. Exertional dyspnea has improved. Is currently getting OT in home and he is getting prepared to do out patient cardiac rehab in [place].  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   NSTEMI (non-ST elevated myocardial infarction) (HCC) 08/29/2024   Noncompliance with diabetes treatment 01/01/2024   Chronic pain in left shoulder 10/03/2023   Alternating constipation and diarrhea 04/02/2023   CKD stage 3 due to type 2 diabetes mellitus (HCC) 01/01/2023   Diabetes mellitus (HCC) 12/13/2020   Hypertension associated with type 2 diabetes mellitus (HCC) 04/30/2020   Obesity (BMI 30.0-34.9) 04/30/2020   Family history of genetic mutation for hereditary nonpolyposis colorectal cancer (HNPCC)    Family history of prostate cancer    Family history of stomach cancer    History of CVA (cerebrovascular accident) 06/26/2015   Hyperlipidemia associated with type 2 diabetes mellitus (HCC) 06/26/2015   OSA on CPAP 06/26/2015        Review of Systems  Constitutional:  Negative for diaphoresis.  Eyes:  Negative for pain.  Respiratory:  Negative for shortness of breath.   Cardiovascular:  Negative for chest pain, palpitations and leg swelling.  Gastrointestinal:  Negative for abdominal pain.  Endocrine: Negative for polydipsia.  Skin:  Negative for rash.  Neurological:  Negative for dizziness, weakness and headaches.  Hematological:  Does not bruise/bleed easily.  All other systems reviewed and are negative.      Objective:   Physical Exam Constitutional:      Appearance: Normal appearance.  Cardiovascular:     Rate and Rhythm: Normal rate and regular rhythm.     Heart sounds: Normal heart sounds.  Pulmonary:     Effort: Pulmonary effort is normal.     Breath sounds: Normal breath sounds.  Skin:    General: Skin is warm.  Neurological:     General: No focal deficit present.     Mental Status: He is alert and oriented to person, place, and time.  Psychiatric:        Mood and Affect: Mood normal.        Behavior: Behavior normal.     BP 109/71   Pulse 74   Temp (!) 96.6 F (35.9 C) (Temporal)   Ht 5' 7 (1.702 m)   Wt 172 lb (78 kg)   SpO2 100%   BMI 26.94 kg/m        Assessment & Plan:   Bryan Shepard. in today with chief complaint of Transitions Of  Care   1. Status post coronary artery stent placement (Primary) Keep follow up with cardiology Continue cardiac rehab when ordered Report any chest pain Hospital records reviewed RTO prn    The above assessment and management plan was discussed with the patient. The patient verbalized understanding of and has agreed to the management plan. Patient is aware to call the clinic if symptoms persist or worsen. Patient is aware when to return to the clinic for a follow-up visit. Patient educated on when it is appropriate to go to the emergency department.   Mary-Margaret Gladis, FNP   "

## 2024-10-08 NOTE — Patient Instructions (Signed)
 Chest Pain (Angina): What to Know Angina is pain or discomfort in the chest. It can also be felt in the neck, arm, jaw, or back. Angina is caused by not having enough blood flow to the heart wall. Angina may be a warning that you're at risk for having a heart attack. What are the causes? Angina is most often caused by build-up of plaque in your arteries that makes it hard for blood to flow. Plaque narrows and blocks the arteries of the heart. Plaque is made of fats and cholesterol. Angina is also caused by: Sudden spasms of the muscles in the arteries of the heart. Small artery disease. Heart valve problems. A tear in an artery of your heart. Weakness of the heart muscle. What increases the risk? Main risks Having high cholesterol. High blood pressure. Having diabetes. Family history of heart disease. Not exercising or moving enough. Having had radiation treatment to the left side of your chest. Other risks Using tobacco products. Being very overweight. Eating foods that have a lot of unhealthy fats. Feeling stressed or having depression. Using drugs, such as cocaine. What are the signs or symptoms? Symptoms in all people Chest pain, which may: Feel like a crushing or squeezing in the chest. Feel like a tightness, pressure, or heaviness in the chest. Last for more than a few minutes at a time. Stop and come back. Pain in the neck, arm, jaw, or back. Heartburn or upset stomach for no reason. Being short of breath. Feeling like you may throw up. Sudden cold sweats. Other symptoms in females Tiredness or weakness. Worry and anxiety. Dizziness or fainting. How is this diagnosed?  Your symptoms and medical history. Blood tests. Electrocardiogram (ECG) to measure the electrical activity of your heart. Stress test to look for signs of a blocked artery. CT angiogram to examine your heart and the blood flow to it. Coronary angiogram to check for a blocked artery. How is this  treated? Medicines to: Prevent blood clots. Relax blood vessels and improve blood flow to the heart. Lower blood pressure. Reduce cholesterol. You may have a procedure called angioplasty to widen a narrowed or blocked artery. A small mesh tube called a stent may be put in the artery to keep it open. Surgery may be needed to allow blood to go around a blocked artery. Follow these instructions at home: Medicines Take your medicines only as told. Do not take these medicines unless your provider says that you can: NSAIDs, such as ibuprofen and naproxen. Supplements that contain vitamin A, vitamin E, or both. Hormone therapy that contains estrogen with or without progestin. Eating and drinking  Eat a healthy diet that includes: Lots of fresh fruits and vegetables. Whole grains. Low-fat protein. Low-fat dairy products. Follow instructions about what you may eat and drink. Activity Exercise as told. Talk with your provider about doing a program called cardiac rehab to help make your heart strong. When you feel tired, take a break. Plan breaks if you know you're going to feel tired. Lifestyle Do not smoke, vape, or use nicotine or tobacco. If your provider says you can drink alcohol: Limit how much you have to: 0-1 drink a day if you're male and not pregnant. 0-2 drinks a day if you're male. Know how much alcohol is in your drink. In the [place] one drink is one 12 oz bottle of beer (355 mL), one 5 oz glass of wine (148 mL), or one 1 oz glass of hard liquor (44 mL). General instructions  Stay at a healthy weight. If told to lose weight, work with your provider to lose weight safely. Keep your vaccines up to date. Get a flu shot every year. Learn to manage stress. If you need help, ask your provider. Talk with your provider if you feel depressed. Work with your provider to manage any other health problems that you have. These may include diabetes or high blood pressure. Keep all  follow-up visits. Your provider will want to check on your condition. Get help right away if: You have pain in your chest, neck, arm, jaw, or back, and the pain: Happens more often. Lasts more than a few minutes. Goes away and comes back. Does not get better after you take medicine under your tongue. You're dizzy or light-headed all of a sudden. You faint. You have any combination of these problems: Cold sweats. Heartburn or upset stomach. Trouble breathing. Feeling like you may throw up, or you throw up. Feeling very tired or weak. Feeling worried or nervous. These symptoms may be an emergency. Call 911 right away. Do not wait to see if the symptoms will go away. Do not drive yourself to the hospital. This information is not intended to replace advice given to you by your health care provider. Make sure you discuss any questions you have with your health care provider. Document Revised: 08/05/2023 Document Reviewed: 02/16/2023 Elsevier Patient Education  2024 [employer].

## 2024-10-13 ENCOUNTER — Ambulatory Visit: Admitting: Pharmacist

## 2024-10-13 ENCOUNTER — Telehealth: Payer: Self-pay | Admitting: Family Medicine

## 2024-10-13 DIAGNOSIS — E1169 Type 2 diabetes mellitus with other specified complication: Secondary | ICD-10-CM

## 2024-10-13 NOTE — Telephone Encounter (Signed)
 Please order new referral. Copied from CRM #[id number]. Topic: Referral - Question >> Oct 13, 2024  8:22 AM Avram MATSU wrote: Reason for CRM: Precious is requesting a referral for diabetes education with diagnosis code of  e11.69 patient has an appointment tomorrow.  Fax:[phone] Phone:[phone]

## 2024-10-13 NOTE — Telephone Encounter (Signed)
 Please expedite referral. Patient is scheduled for visit tomorrow.

## 2024-10-13 NOTE — Telephone Encounter (Signed)
" °  [facility]  Needs a referral sent to them  "

## 2024-10-14 ENCOUNTER — Encounter: Attending: Family Medicine | Admitting: Nutrition

## 2024-10-14 ENCOUNTER — Encounter: Payer: Self-pay | Admitting: Nutrition

## 2024-10-14 DIAGNOSIS — Z713 Dietary counseling and surveillance: Secondary | ICD-10-CM | POA: Diagnosis not present

## 2024-10-14 DIAGNOSIS — E1165 Type 2 diabetes mellitus with hyperglycemia: Secondary | ICD-10-CM | POA: Diagnosis present

## 2024-10-14 DIAGNOSIS — Z7984 Long term (current) use of oral hypoglycemic drugs: Secondary | ICD-10-CM | POA: Diagnosis not present

## 2024-10-14 NOTE — Patient Instructions (Signed)
 Goals Focus on a whole plant based diet DO the Full Plate Living Program Cut out red meat and processed foods Get in 38 grams of fiber per day Eat 45 grams of carbs per meal. Don't skip meals. Take Metformin  after meals Drink 5 bottles of water  per day Walk as tolerated for exercise. Call [employer] and get replacements for sensors.

## 2024-10-14 NOTE — Progress Notes (Signed)
 Medical Nutrition Therapy  Appointment Start time:  1110 Appointment End time: 1155 Primary concerns today: Dm Type 2  Referral diagnosis: E11.65 Preferred learning style: NO Preference Learning readiness: Ready    NUTRITION ASSESSMENT Follow up  Here with his wife. His wife has trouble getting the [place] sensors put on. 3 of the sensors. Will contact company today to get replacements. S/P 2 heart attacks and CABG procedures. Will be getting some stents put in.Signed up for cardiac rehab. Has changed his diet a lot per his wife. Trying to avoid carbohydrates. Eating more chicken and lean beef, a lot more vegetables, fruits.  Sometimes not hungry. Eating 2 meals per day. Currently on Metformin  and Jardiance . FBS 90-113 mg/dl   2 hr pp 879'd. Has lost about 30 lbs since his heart attacks. Highest weight was 209 lbs. Current weight 174 lbs. He stays thirsty. Looks pale today. Has been doing some hand weights for exercises at home. Complains of being tired at times.  Wt Readings from Last 3 Encounters:  10/14/24 174 lb (78.9 kg)  10/08/24 172 lb (78 kg)  10/01/24 179 lb (81.2 kg)   Ht Readings from Last 3 Encounters:  10/14/24 5' 7 (1.702 m)  10/08/24 5' 7 (1.702 m)  10/01/24 5' 7 (1.702 m)   Body mass index is 27.25 kg/m. @BMIFA @ Facility age limit for growth %iles is 20 years. Facility age limit for growth %iles is 20 years.   He is willing to work on Lifestyle Medicine to improve  his DM and reduce risks of health issues with focusing on more whole plant based foods and 6 pillars of health.  Clinical Lab Results  Component Value Date   HGBA1C 7.0 (H) 09/25/2024      Latest Ref Rng & Units 10/01/2024   11:12 AM 09/28/2024    4:57 AM 09/27/2024    8:05 AM  CMP  Glucose 70 - 99 mg/dL 892  888  898   BUN 8 - 27 mg/dL 26  26  28    Creatinine 0.76 - 1.27 mg/dL 8.45  8.66  8.73   Sodium 134 - 144 mmol/L 140  139  139   Potassium 3.5 - 5.2 mmol/L 4.3  3.8  3.4    Chloride 96 - 106 mmol/L 106  106  104   CO2 20 - 29 mmol/L 17  18  20    Calcium  8.6 - 10.2 mg/dL 9.8  9.0  9.2    Lipid Panel     Component Value Date/Time   CHOL 56 09/25/2024 1817   CHOL 160 04/06/2024 1118   TRIG 76 09/25/2024 1817   HDL 23 (L) 09/25/2024 1817   HDL 27 (L) 04/06/2024 1118   CHOLHDL 2.5 09/25/2024 1817   VLDL 15 09/25/2024 1817   LDLCALC 18 09/25/2024 1817   LDLCALC 99 04/06/2024 1118   LABVLDL 34 04/06/2024 1118    Medical Hx: Past Medical History:  Diagnosis Date   Acid reflux    Colon polyps    Diabetes mellitus type 2 in nonobese (HCC) 12/13/2020   Family history of genetic mutation for hereditary nonpolyposis colorectal cancer (HNPCC)    Family history of prostate cancer    Family history of stomach cancer    HOH (hard of hearing)    Hx of CABG 09/01/2024   Hypertension    Sleep apnea    CPAP   Stroke (HCC) 2015   TIA (transient ischemic attack) 2016    Medications:  Current Outpatient Medications  on File Prior to Visit  Medication Sig Dispense Refill   Accu-Chek Softclix Lancets lancets CHECK SUGAR IN THE MORNING, AT NOON, AND AT BEDTIME 100 each 3   ACETAMINOPHEN  ER PO Take 650 mg by mouth every 8 (eight) hours as needed (pain).     albuterol  (VENTOLIN  HFA) 108 (90 Base) MCG/ACT inhaler Inhale 2 puffs into the lungs every 6 (six) hours as needed for shortness of breath.     aspirin  EC 81 MG tablet Take 1 tablet (81 mg total) by mouth daily. Swallow whole. 90 tablet 2   CYANOCOBALAMIN PO Take 3,000 mcg by mouth daily.     diclofenac Sodium (VOLTAREN) 1 % GEL Apply 4 g topically 4 (four) times daily as needed (for pain). On the back     empagliflozin  (JARDIANCE ) 10 MG TABS tablet Take 1 tablet (10 mg total) by mouth daily. 30 tablet 2   metFORMIN  (GLUCOPHAGE ) 500 MG tablet Take 1 tablet (500 mg total) by mouth 2 (two) times daily with a meal. 60 tablet 1   metoprolol  tartrate (LOPRESSOR ) 25 MG tablet Take 1 tablet (25 mg total) by mouth 2  (two) times daily. 60 tablet 2   Multiple Vitamin (MULTIVITAMIN) tablet Take 1 tablet by mouth daily. Vitafusion Men's     nitroGLYCERIN  (NITROSTAT ) 0.4 MG SL tablet Place 1 tablet (0.4 mg total) under the tongue every 5 (five) minutes x 3 doses as needed for chest pain. 25 tablet 2   rosuvastatin  (CRESTOR ) 20 MG tablet Take 1 tablet (20 mg total) by mouth daily at 6 PM. 30 tablet 3   ticagrelor  (BRILINTA ) 90 MG TABS tablet Take 1 tablet (90 mg total) by mouth 2 (two) times daily. 180 tablet 2   torsemide  (DEMADEX ) 20 MG tablet Take 1 tablet (20 mg total) by mouth daily. 30 tablet 1   traMADol  (ULTRAM ) 50 MG tablet Take 50 mg by mouth every 4 (four) hours as needed for moderate pain (pain score 4-6).     No current facility-administered medications on file prior to visit.    Labs:  Lab Results  Component Value Date   HGBA1C 7.0 (H) 09/25/2024      Latest Ref Rng & Units 10/01/2024   11:12 AM 09/28/2024    4:57 AM 09/27/2024    8:05 AM  CMP  Glucose 70 - 99 mg/dL 892  888  898   BUN 8 - 27 mg/dL 26  26  28    Creatinine 0.76 - 1.27 mg/dL 8.45  8.66  8.73   Sodium 134 - 144 mmol/L 140  139  139   Potassium 3.5 - 5.2 mmol/L 4.3  3.8  3.4   Chloride 96 - 106 mmol/L 106  106  104   CO2 20 - 29 mmol/L 17  18  20    Calcium  8.6 - 10.2 mg/dL 9.8  9.0  9.2    Lipid Panel     Component Value Date/Time   CHOL 56 09/25/2024 1817   CHOL 160 04/06/2024 1118   TRIG 76 09/25/2024 1817   HDL 23 (L) 09/25/2024 1817   HDL 27 (L) 04/06/2024 1118   CHOLHDL 2.5 09/25/2024 1817   VLDL 15 09/25/2024 1817   LDLCALC 18 09/25/2024 1817   LDLCALC 99 04/06/2024 1118   LABVLDL 34 04/06/2024 1118   Wt Readings from Last 3 Encounters:  10/08/24 172 lb (78 kg)  10/01/24 179 lb (81.2 kg)  09/28/24 173 lb 9.6 oz (78.7 kg)   Ht Readings from  Last 3 Encounters:  10/08/24 5' 7 (1.702 m)  10/01/24 5' 7 (1.702 m)  09/28/24 5' 7 (1.702 m)   There is no height or weight on file to calculate  BMI. @BMIFA @ Facility age limit for growth %iles is 20 years. Facility age limit for growth %iles is 20 years.   Notable Signs/Symptoms:  Lifestyle & Dietary Hx LIves with his wife.  Eats 2-3 meals per day  Estimated daily fluid intake: 40 oz Supplements:  Sleep:  Stress / self-care:  Current average weekly physical activity: Walks a little  24-Hr Dietary Recall B) water , fruit,  L)D salad, lean beef, vegetables, keto bread, cottage cheese and peaches, wate    Estimated Energy Needs Calories: 1600 Carbohydrate: 180g Protein: 120g Fat: 44g   NUTRITION DIAGNOSIS  NB-1.1 Food and nutrition-related knowledge deficit As related to Diabetes.  As evidenced by A1C 7.6%.   NUTRITION INTERVENTION   Reviewed plant based carbs to eat with meals; Needs 45 grams of carbs per meal.  Lifestyle Medicine  - Whole Food, Plant Predominant Nutrition is highly recommended: Eat Plenty of vegetables, Mushrooms, fruits, Legumes, Whole Grains, Nuts, seeds in lieu of processed meats, processed snacks/pastries red meat, poultry, eggs.    -It is better to avoid simple carbohydrates including: Cakes, Sweet Desserts, Ice Cream, Soda (diet and regular), Sweet Tea, Candies, Chips, Cookies, Store Bought Juices, Alcohol in Excess of  1-2 drinks a day, Lemonade,  Artificial Sweeteners, Doughnuts, Coffee Creamers, Sugar-free Products, etc, etc.  This is not a complete list.....  Exercise: If you are able: 30 -60 minutes a day ,4 days a week, or 150 minutes a week.  The longer the better.  Combine stretch, strength, and aerobic activities.  If you were told in the past that you have high risk for cardiovascular diseases, you may seek evaluation by your heart doctor prior to initiating moderate to intense exercise programs.   Handouts Provided Include  Emotional eating   Learning Style & Readiness for Change Teaching method utilized: Visual & Auditory  Demonstrated degree of understanding via: Teach  Back  Barriers to learning/adherence to lifestyle change: NOne  Goals Focus on a whole plant based diet DO the Full Plate Living Program Cut out red meat and processed foods Get in 38 grams of fiber per day Eat 45 grams of carbs per meal. Don't skip meals. Take Metformin  after meals Drink 5 bottles of water  per day Walk as tolerated for exercise. Call [employer] and get replacements for sensors.   MONITORING & EVALUATION Dietary intake, weekly physical activity, and BS in 3 months.  Next Steps  Patient is to work on meal planning, meal prepping and avoiding snacks.SABRA

## 2024-10-21 ENCOUNTER — Other Ambulatory Visit: Payer: Self-pay

## 2024-10-21 ENCOUNTER — Ambulatory Visit

## 2024-10-21 ENCOUNTER — Ambulatory Visit
Admission: RE | Admit: 2024-10-21 | Discharge: 2024-10-21 | Disposition: A | Discharge status: Home / Self Care | Payer: Self-pay | Source: Ambulatory Visit | Attending: Internal Medicine | Admitting: Internal Medicine

## 2024-10-21 VITALS — BP 118/76 | HR 68 | Resp 18 | Ht 67.0 in

## 2024-10-21 DIAGNOSIS — Z951 Presence of aortocoronary bypass graft: Secondary | ICD-10-CM | POA: Insufficient documentation

## 2024-10-21 NOTE — Progress Notes (Signed)
 "     [address] [place] [address]             [phone]       HPI:  Patient returns for routine postoperative follow-up having undergone CABG on 09/01/24.  He did not have any significant issues during his postoperative course, however he never really felt well after surgery.  His main complaint was shortness of breath.  Subsequent LHC demonstrated a patent LIMA to LAD but total occlusion of the vein grafts to the RCA, OM and ramus.  A couple days later he underwent stent placement to the ramus and the graft to the OM was noted to be open with TIMI-3 flow.  Since then, he has been doing okay.  He is now able to climb stairs without resting and his dyspnea has been improving.  He is able to walk farther without shortness of breath and is gradually getting stronger.  He has been using trazodone  for sleep which has been helping but did not need it last night.  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     Pain medication: None Bowel function: Normal   Allergies as of 10/21/2024   No Known Allergies      Medication List        Accurate as of October 21, 2024 11:32 AM. If you have any questions, ask your nurse or doctor.          Accu-Chek Softclix Lancets lancets CHECK SUGAR IN THE MORNING, AT NOON, AND AT BEDTIME   ACETAMINOPHEN  ER PO Take 650 mg by mouth every 8 (eight) hours as needed (pain).   albuterol  108 (90 Base) MCG/ACT inhaler Commonly known as: VENTOLIN  HFA Inhale 2 puffs into the lungs every 6 (six) hours as needed for shortness of breath.   Aspirin  EC Adult Low Dose 81 MG tablet Generic drug: aspirin  EC Take 1 tablet (81 mg total) by mouth daily. Swallow whole.   CYANOCOBALAMIN PO Take 3,000 mcg by mouth daily.   diclofenac Sodium 1 % Gel Commonly known as: VOLTAREN Apply 4 g topically 4 (four) times daily as needed (for pain). On the back   empagliflozin  10 MG Tabs  tablet Commonly known as: JARDIANCE  Take 1 tablet (10 mg total) by mouth daily.   metFORMIN  500 MG tablet Commonly known as: GLUCOPHAGE  Take 1 tablet (500 mg total) by mouth 2 (two) times daily with a meal.   metoprolol  tartrate 25 MG tablet Commonly known as: LOPRESSOR  Take 1 tablet (25 mg total) by mouth 2 (two) times daily.   multivitamin tablet Take 1 tablet by mouth daily. Vitafusion Men's   nitroGLYCERIN  0.4 MG SL tablet Commonly known as: NITROSTAT  Place 1 tablet (0.4 mg total) under the tongue every 5 (five) minutes x 3 doses as needed for chest pain.   rosuvastatin  20 MG tablet Commonly known as: CRESTOR  Take 1 tablet (20 mg total) by mouth daily at 6 PM.   ticagrelor  90 MG Tabs tablet Commonly known as: BRILINTA  Take 1 tablet (90 mg total) by mouth 2 (two) times daily.   torsemide  20 MG tablet Commonly known as: DEMADEX  Take 1 tablet (20 mg total) by mouth daily.   traMADol  50 MG tablet Commonly known as: ULTRAM  Take 50 mg by mouth every 4 (four) hours as needed for moderate pain (pain score 4-6).        BP 118/76  Pulse 68   Resp 18   Ht 5' 7 (1.702 m)   SpO2 98% Comment: RA  BMI 27.25 kg/m   Physical Exam: General - Sitting comfortably in chair in no acute distress CV - RRR, no murmurs, sternotomy incision healing well Resp -clear bilaterally Abd -soft, nondistended, nontender Ext -mild edema    Imaging: CXR - Clear lung fields. No pleural effusion or pneumothorax.  Assessment/Plan: Bryan Shepard is a pleasant 69 year old gentleman with a history of diabetes and multivessel coronary artery disease s/p CABG x 4 on 09/01/2024.  His postop course has been complicated by STEMI and total occlusion his RCA and ramus graft but with patent LIMA and SVG to OM.  He is now starting to improve and his dyspnea is less.  He will continue Brilinta  for his recent stent and return to see me in 1 month for follow-up.  Con GORMAN Clunes, MD 11:32 AM 10/21/24  "

## 2024-10-25 ENCOUNTER — Other Ambulatory Visit (HOSPITAL_COMMUNITY): Payer: Self-pay | Admitting: Nephrology

## 2024-10-25 DIAGNOSIS — N1831 Chronic kidney disease, stage 3a: Secondary | ICD-10-CM

## 2024-10-25 DIAGNOSIS — I129 Hypertensive chronic kidney disease with stage 1 through stage 4 chronic kidney disease, or unspecified chronic kidney disease: Secondary | ICD-10-CM

## 2024-10-25 DIAGNOSIS — D638 Anemia in other chronic diseases classified elsewhere: Secondary | ICD-10-CM

## 2024-10-27 ENCOUNTER — Ambulatory Visit (HOSPITAL_COMMUNITY)
Admission: RE | Admit: 2024-10-27 | Discharge: 2024-10-27 | Disposition: A | Discharge status: Home / Self Care | Source: Ambulatory Visit | Attending: Nephrology | Admitting: Nephrology

## 2024-10-27 DIAGNOSIS — D638 Anemia in other chronic diseases classified elsewhere: Secondary | ICD-10-CM | POA: Insufficient documentation

## 2024-10-27 DIAGNOSIS — I129 Hypertensive chronic kidney disease with stage 1 through stage 4 chronic kidney disease, or unspecified chronic kidney disease: Secondary | ICD-10-CM | POA: Insufficient documentation

## 2024-10-27 DIAGNOSIS — N1831 Chronic kidney disease, stage 3a: Secondary | ICD-10-CM | POA: Insufficient documentation

## 2024-10-28 ENCOUNTER — Encounter (HOSPITAL_COMMUNITY)
Admission: RE | Admit: 2024-10-28 | Discharge: 2024-10-28 | Disposition: A | Discharge status: Home / Self Care | Source: Ambulatory Visit

## 2024-10-28 DIAGNOSIS — Z955 Presence of coronary angioplasty implant and graft: Secondary | ICD-10-CM | POA: Insufficient documentation

## 2024-10-28 DIAGNOSIS — Z951 Presence of aortocoronary bypass graft: Secondary | ICD-10-CM | POA: Insufficient documentation

## 2024-10-28 DIAGNOSIS — I213 ST elevation (STEMI) myocardial infarction of unspecified site: Secondary | ICD-10-CM | POA: Insufficient documentation

## 2024-10-28 NOTE — Progress Notes (Signed)
 Virtual orientation visit completed for cardiac rehab with S/P CABGx4. On-site orientation visit scheduled for 11/08/24 at 10:30.

## 2024-11-07 ENCOUNTER — Encounter (HOSPITAL_COMMUNITY): Payer: Self-pay | Admitting: *Deleted

## 2024-11-08 ENCOUNTER — Encounter (HOSPITAL_COMMUNITY)

## 2024-11-17 ENCOUNTER — Encounter (HOSPITAL_COMMUNITY)

## 2024-11-19 ENCOUNTER — Ambulatory Visit: Admitting: Internal Medicine

## 2024-11-25 ENCOUNTER — Ambulatory Visit

## 2024-12-15 ENCOUNTER — Ambulatory Visit: Admitting: Family Medicine

## 2025-02-01 ENCOUNTER — Encounter: Admitting: Nutrition

## 2025-03-21 ENCOUNTER — Ambulatory Visit: Payer: Self-pay

## 2025-04-01 ENCOUNTER — Ambulatory Visit
